# Patient Record
Sex: Male | Born: 1950 | Race: White | Hispanic: No | Marital: Married | Smoking: Former smoker
Health system: Southern US, Community
[De-identification: ages and names within clinical notes are randomized; demographics above are authoritative.]

## PROBLEM LIST (undated history)

## (undated) DIAGNOSIS — R7303 Prediabetes: Secondary | ICD-10-CM

## (undated) DIAGNOSIS — K589 Irritable bowel syndrome without diarrhea: Secondary | ICD-10-CM

## (undated) DIAGNOSIS — M1990 Unspecified osteoarthritis, unspecified site: Secondary | ICD-10-CM

## (undated) DIAGNOSIS — J449 Chronic obstructive pulmonary disease, unspecified: Secondary | ICD-10-CM

## (undated) DIAGNOSIS — E785 Hyperlipidemia, unspecified: Secondary | ICD-10-CM

## (undated) DIAGNOSIS — N189 Chronic kidney disease, unspecified: Secondary | ICD-10-CM

## (undated) DIAGNOSIS — K3580 Unspecified acute appendicitis: Secondary | ICD-10-CM

## (undated) DIAGNOSIS — R319 Hematuria, unspecified: Secondary | ICD-10-CM

## (undated) DIAGNOSIS — I10 Essential (primary) hypertension: Secondary | ICD-10-CM

## (undated) DIAGNOSIS — K219 Gastro-esophageal reflux disease without esophagitis: Secondary | ICD-10-CM

## (undated) DIAGNOSIS — E559 Vitamin D deficiency, unspecified: Secondary | ICD-10-CM

## (undated) HISTORY — DX: Vitamin D deficiency, unspecified: E55.9

## (undated) HISTORY — DX: Chronic kidney disease, unspecified: N18.9

## (undated) HISTORY — DX: Chronic obstructive pulmonary disease, unspecified: J44.9

## (undated) HISTORY — DX: Hematuria, unspecified: R31.9

## (undated) HISTORY — DX: Prediabetes: R73.03

## (undated) HISTORY — DX: Hyperlipidemia, unspecified: E78.5

## (undated) HISTORY — PX: TRIGGER FINGER RELEASE: SHX641

## (undated) HISTORY — DX: Unspecified acute appendicitis: K35.80

## (undated) HISTORY — PX: CARPAL TUNNEL RELEASE: SHX101

## (undated) HISTORY — DX: Unspecified osteoarthritis, unspecified site: M19.90

## (undated) HISTORY — PX: CATARACT EXTRACTION, BILATERAL: SHX1313

## (undated) HISTORY — DX: Gastro-esophageal reflux disease without esophagitis: K21.9

## (undated) HISTORY — DX: Irritable bowel syndrome, unspecified: K58.9

## (undated) HISTORY — PX: CERVICAL FUSION: SHX112

## (undated) HISTORY — DX: Essential (primary) hypertension: I10

## (undated) HISTORY — PX: HAND SURGERY: SHX662

---

## 1999-04-11 ENCOUNTER — Encounter: Payer: Self-pay | Admitting: Neurosurgery

## 1999-04-17 ENCOUNTER — Encounter: Payer: Self-pay | Admitting: Neurosurgery

## 1999-04-17 ENCOUNTER — Ambulatory Visit (HOSPITAL_COMMUNITY): Admission: RE | Admit: 1999-04-17 | Discharge: 1999-04-18 | Payer: Self-pay | Admitting: Neurosurgery

## 1999-05-13 ENCOUNTER — Ambulatory Visit (HOSPITAL_COMMUNITY): Admission: RE | Admit: 1999-05-13 | Discharge: 1999-05-13 | Payer: Self-pay | Admitting: Neurosurgery

## 2000-01-12 ENCOUNTER — Ambulatory Visit (HOSPITAL_COMMUNITY): Admission: RE | Admit: 2000-01-12 | Discharge: 2000-01-12 | Payer: Self-pay | Admitting: Neurosurgery

## 2000-01-12 ENCOUNTER — Encounter: Payer: Self-pay | Admitting: Neurosurgery

## 2000-11-20 ENCOUNTER — Ambulatory Visit (HOSPITAL_COMMUNITY): Admission: RE | Admit: 2000-11-20 | Discharge: 2000-11-20 | Payer: Self-pay | Admitting: Gastroenterology

## 2002-06-03 ENCOUNTER — Ambulatory Visit (HOSPITAL_COMMUNITY): Admission: RE | Admit: 2002-06-03 | Discharge: 2002-06-03 | Payer: Self-pay | Admitting: Internal Medicine

## 2002-06-03 ENCOUNTER — Encounter: Payer: Self-pay | Admitting: Internal Medicine

## 2002-08-10 ENCOUNTER — Ambulatory Visit (HOSPITAL_BASED_OUTPATIENT_CLINIC_OR_DEPARTMENT_OTHER): Admission: RE | Admit: 2002-08-10 | Discharge: 2002-08-10 | Payer: Self-pay | Admitting: Orthopedic Surgery

## 2005-01-01 ENCOUNTER — Ambulatory Visit (HOSPITAL_COMMUNITY): Admission: RE | Admit: 2005-01-01 | Discharge: 2005-01-01 | Payer: Self-pay | Admitting: Internal Medicine

## 2005-07-02 ENCOUNTER — Ambulatory Visit (HOSPITAL_COMMUNITY): Admission: RE | Admit: 2005-07-02 | Discharge: 2005-07-02 | Payer: Self-pay | Admitting: Internal Medicine

## 2007-08-05 ENCOUNTER — Ambulatory Visit (HOSPITAL_COMMUNITY): Admission: RE | Admit: 2007-08-05 | Discharge: 2007-08-05 | Payer: Self-pay | Admitting: Internal Medicine

## 2007-08-05 ENCOUNTER — Ambulatory Visit: Payer: Self-pay | Admitting: Vascular Surgery

## 2009-08-15 ENCOUNTER — Ambulatory Visit (HOSPITAL_COMMUNITY): Admission: RE | Admit: 2009-08-15 | Discharge: 2009-08-15 | Payer: Self-pay | Admitting: Internal Medicine

## 2010-11-08 NOTE — Op Note (Signed)
   NAME:  [name] Park, [name] Park                         ACCOUNT NO.:  [id number]   MEDICAL RECORD NO.:  [id number]                   PATIENT TYPE:  AMB   LOCATION:  DSC                                  FACILITY:  [facility]   PHYSICIAN:  Artist Pais. Mina Marble, M.D.           DATE OF BIRTH:  1950/09/20   DATE OF PROCEDURE:  08/10/2002  DATE OF DISCHARGE:                                 OPERATIVE REPORT   PREOPERATIVE DIAGNOSIS:  Left carpal tunnel syndrome.   POSTOPERATIVE DIAGNOSIS:  Left carpal tunnel syndrome.   PROCEDURE:  Left carpal tunnel release.   SURGEON:  Artist Pais. Mina Marble, M.D.   ASSISTANT:  ____________.   ANESTHESIA:  General.   TOURNIQUET TIME:  11 minutes.   COMPLICATIONS:  No complications or drains.   OPERATIVE FINDINGS:  The patient taken to the operating room.  After  adequate general anesthesia the left upper extremity was prepped and draped  in the usual sterile fashion.  An Esmarch was used to exsanguinate the limb.  The tourniquet was then inflated to 150 mmHg.  At this point in time, a 2 cm  incision was made in the palmar aspect of the left hand in line with the  long finger metacarpal starting at  Kaplan's cardinal line.  The incision  was taken down to the skin and subcutaneous tissues until the palmar fascia  identified.  The palmar fascia was split thus exposing the distal edge of  the transcarpal ligament and the superficial palmar arch.  The superficial  palmar arch was retracted distally and distal edge of the extensor cardinal  ligament was incised with a 15 blade thus excising the median nerve and  contents of the carpal canal.  Next, using curved blunt scissor the  remaining aspects of the transcarpal ligament were divided under direct  vision.  The canal was inspected.  The interosseous lesions again were  present.  The wound was then thoroughly irrigated.  The wound was then  closed the running 3-0 Prolene subcuticular stitch, Steri Strips, 4x4  fluffs  and compression dressing was applied.  Patient tolerated the procedure well  and went to recovery in stable fashion.                                                Artist Pais Mina Marble, M.D.    MAW/MEDQ  D:  08/10/2002  T:  08/10/2002  Job:  [id number]

## 2011-08-18 ENCOUNTER — Ambulatory Visit (HOSPITAL_COMMUNITY)
Admission: RE | Admit: 2011-08-18 | Discharge: 2011-08-18 | Disposition: A | Discharge status: Home / Self Care | Payer: Federal, State, Local not specified - PPO | Source: Ambulatory Visit | Attending: Internal Medicine | Admitting: Internal Medicine

## 2011-08-18 ENCOUNTER — Other Ambulatory Visit (HOSPITAL_COMMUNITY): Payer: Self-pay | Admitting: Internal Medicine

## 2011-08-18 DIAGNOSIS — R0602 Shortness of breath: Secondary | ICD-10-CM | POA: Insufficient documentation

## 2011-08-18 DIAGNOSIS — Z0000 Encounter for general adult medical examination without abnormal findings: Secondary | ICD-10-CM | POA: Insufficient documentation

## 2012-12-17 ENCOUNTER — Encounter: Payer: Self-pay | Admitting: Gastroenterology

## 2013-01-05 ENCOUNTER — Encounter: Payer: Self-pay | Admitting: Gastroenterology

## 2013-01-05 ENCOUNTER — Ambulatory Visit (INDEPENDENT_AMBULATORY_CARE_PROVIDER_SITE_OTHER): Payer: Federal, State, Local not specified - PPO | Admitting: Gastroenterology

## 2013-01-05 VITALS — BP 124/70 | HR 80 | Ht 67.5 in | Wt 180.5 lb

## 2013-01-05 DIAGNOSIS — R1319 Other dysphagia: Secondary | ICD-10-CM

## 2013-01-05 DIAGNOSIS — K219 Gastro-esophageal reflux disease without esophagitis: Secondary | ICD-10-CM

## 2013-01-05 DIAGNOSIS — R066 Hiccough: Secondary | ICD-10-CM

## 2013-01-05 MED ORDER — OMEPRAZOLE 20 MG PO CPDR: 20.0000 mg | DELAYED_RELEASE_CAPSULE | Freq: Every day | ORAL | Status: DC

## 2013-01-05 NOTE — Progress Notes (Signed)
History of Present Illness: This is a 63 year old male accompanied by his wife. He relates a one year history of intermittent difficulties swallowing solid foods associated with chest discomfort and hiccups. He has had to regurgitate food on several occasions to relieve symptoms. He notes infrequent heartburn symptoms. He previously underwent colonoscopies by Dr. Kinnie Scales in 2002 and 2007. Both were normal. Denies weight loss, abdominal pain, constipation, diarrhea, change in stool caliber, melena, hematochezia, nausea, vomiting, chest pain.  Review of Systems: Pertinent positive and negative review of systems were noted in the above HPI section. All other review of systems were otherwise negative.  Current Medications, Allergies, Past Medical History, Past Surgical History, Family History and Social History were reviewed in [employer] record.  Physical Exam: General: Well developed , well nourished, no acute distress Head: Normocephalic and atraumatic Eyes:  sclerae anicteric, EOMI Ears: Normal auditory acuity Mouth: No deformity or lesions Neck: Supple, no masses or thyromegaly Lungs: Clear throughout to auscultation Heart: Regular rate and rhythm; no murmurs, rubs or bruits Abdomen: Soft, non tender and non distended. No masses, hepatosplenomegaly or hernias noted. Normal Bowel sounds Rectal: deferred Musculoskeletal: Symmetrical with no gross deformities  Skin: No lesions on visible extremities Pulses:  Normal pulses noted Extremities: No clubbing, cyanosis, edema or deformities noted Neurological: Alert oriented x 4, grossly nonfocal Cervical Nodes:  No significant cervical adenopathy Inguinal Nodes: No significant inguinal adenopathy Psychological:  Alert and cooperative. Normal mood and affect  Assessment and Recommendations:  1. Solid food dysphagia with hiccups and reflux symptoms. Suspected GERD and an esophageal stricture or esophagitis. Begin omeprazole  20 mg daily and antireflux measures. Schedule endoscopy with possible dilation. The risks, benefits, and alternatives to endoscopy with possible biopsy and possible dilation were discussed with the patient and they consent to proceed.   2. Colorectal cancer screening, average risk. 10 year interval for colonoscopy. Plan for surveillance colonoscopy in May 2017.

## 2013-01-05 NOTE — Patient Instructions (Addendum)
You have been scheduled for an endoscopy with propofol. Please follow written instructions given to you at your visit today. If you use inhalers (even only as needed), please bring them with you on the day of your procedure. Your physician has requested that you go to www.startemmi.com and enter the access code given to you at your visit today. This web site gives a general overview about your procedure. However, you should still follow specific instructions given to you by our office regarding your preparation for the procedure.  We have sent the following medications to your pharmacy for you to pick up at your convenience: Omeprazole.  Patient advised to avoid spicy, acidic, citrus, chocolate, mints, fruit and fruit juices.  Limit the intake of caffeine, alcohol and Soda.  Don't exercise too soon after eating.  Don't lie down within 3-4 hours of eating.  Elevate the head of your bed.  Thank you for choosing me and [facility].  Venita Lick. Pleas Koch., MD., Clementeen Graham  cc: Lucky Cowboy, MD

## 2013-02-16 ENCOUNTER — Ambulatory Visit (AMBULATORY_SURGERY_CENTER): Payer: Federal, State, Local not specified - PPO | Admitting: Gastroenterology

## 2013-02-16 ENCOUNTER — Encounter: Payer: Self-pay | Admitting: Gastroenterology

## 2013-02-16 VITALS — BP 122/72 | HR 77 | Temp 97.5°F | Resp 16 | Ht 67.0 in | Wt 180.0 lb

## 2013-02-16 DIAGNOSIS — R1319 Other dysphagia: Secondary | ICD-10-CM

## 2013-02-16 DIAGNOSIS — K219 Gastro-esophageal reflux disease without esophagitis: Secondary | ICD-10-CM

## 2013-02-16 DIAGNOSIS — K222 Esophageal obstruction: Secondary | ICD-10-CM

## 2013-02-16 MED ORDER — SODIUM CHLORIDE 0.9 % IV SOLN: 500.0000 mL | INTRAVENOUS | Status: DC

## 2013-02-16 NOTE — Op Note (Addendum)
[facility] [address] [place], [address]   ENDOSCOPY PROCEDURE REPORT  PATIENT: [name] Park, [name] Park  MR#: [id number] BIRTHDATE: 1950-10-11 , 61  yrs. old GENDER: Male ENDOSCOPIST: Meryl Dare, MD, [facility] REFERRED BY:  Lucky Cowboy, M.D. PROCEDURE DATE:  02/16/2013 PROCEDURE:  EGD, diagnostic and Savary dilation of esophagus ASA CLASS:     Class II INDICATIONS:  Dysphagia.   History of esophageal reflux. MEDICATIONS: MAC sedation, administered by CRNA and propofol (Diprivan) 200mg  IV TOPICAL ANESTHETIC: Cetacaine Spray DESCRIPTION OF PROCEDURE: After the risks benefits and alternatives of the procedure were thoroughly explained, informed consent was obtained.  The LB [id number] endoscope was introduced through the mouth and advanced to the second portion of the duodenum without limitations.  The instrument was slowly withdrawn as the mucosa was fully examined.  ESOPHAGUS: A stricture was found at the gastroesophageal junction. The stenosis was traversable with the endoscope. The esophagus was otherwise normal. STOMACH: The mucosa and folds of the stomach appeared normal. DUODENUM: The duodenal mucosa showed no abnormalities in the bulb and second portion of the duodenum.  Retroflexed views revealed a 5 cm hiatal hernia.  A guidewire was placed and the endoscope was then withdrawn from the patient. 16 mm and 17 mm Savary dilators were passed with minimal resistance to each and no heme. The guidewire and last dilator were removed and the procedure was completed.  COMPLICATIONS: There were no complications.  ENDOSCOPIC IMPRESSION: 1.   Stricture at the gastroesophageal junction 2.   5 cm hiatal hernia  RECOMMENDATIONS: 1.  Anti-reflux regimen long term 2.  Continue PPI long term 3.  Post dilation instructions  eSigned:  Meryl Dare, MD, [facility] 02/16/2013 1:51 PM Revised: 02/16/2013 1:51 PM

## 2013-02-16 NOTE — Progress Notes (Signed)
Lidocaine-40mg IV prior to Propofol InductionPropofol given over incremental dosages 

## 2013-02-16 NOTE — Progress Notes (Signed)
Called to room to assist during endoscopic procedure.  Patient ID and intended procedure confirmed with present staff. Received instructions for my participation in the procedure from the performing physician.  

## 2013-02-16 NOTE — Patient Instructions (Signed)
      YOU HAD AN ENDOSCOPIC PROCEDURE TODAY AT THE [facility]: Refer to the procedure report that was given to you for any specific questions about what was found during the examination.  If the procedure report does not answer your questions, please call your gastroenterologist to clarify.  If you requested that your care partner not be given the details of your procedure findings, then the procedure report has been included in a sealed envelope for you to review at your convenience later.  YOU SHOULD EXPECT: Some feelings of bloating in the abdomen. Passage of more gas than usual.  Walking can help get rid of the air that was put into your GI tract during the procedure and reduce the bloating. If you had a lower endoscopy (such as a colonoscopy or flexible sigmoidoscopy) you may notice spotting of blood in your stool or on the toilet paper. If you underwent a bowel prep for your procedure, then you may not have a normal bowel movement for a few days.  DIET: NOTHING TO EAT OR DRINK UNTIL 2:45 PM. 2:45 UNTIL 3:45 ONLY CLEAR LIQUIDS. AFTER 3:45 ONLY SOFT FOODS UNTIL TOMORROW AM. RESUME YOUR REGULAR DIET IN AM.  ACTIVITY: Your care partner should take you home directly after the procedure.  You should plan to take it easy, moving slowly for the rest of the day.  You can resume normal activity the day after the procedure however you should NOT DRIVE or use heavy machinery for 24 hours (because of the sedation medicines used during the test).    SYMPTOMS TO REPORT IMMEDIATELY: A gastroenterologist can be reached at any hour.  During normal business hours, 8:30 AM to 5:00 PM Monday through Friday, call [phone].  After hours and on weekends, please call the GI answering service at [phone] who will take a message and have the physician on call contact you.   Following upper endoscopy (EGD)  Vomiting of blood or coffee ground material  New chest pain or pain under the  shoulder blades  Painful or persistently difficult swallowing  New shortness of breath  Fever of 100F or higher  Black, tarry-looking stools  FOLLOW UP: If any biopsies were taken you will be contacted by phone or by letter within the next 1-3 weeks.  Call your gastroenterologist if you have not heard about the biopsies in 3 weeks.  Our staff will call the home number listed on your records the next business day following your procedure to check on you and address any questions or concerns that you may have at that time regarding the information given to you following your procedure. This is a courtesy call and so if there is no answer at the home number and we have not heard from you through the emergency physician on call, we will assume that you have returned to your regular daily activities without incident.  SIGNATURES/CONFIDENTIALITY: You and/or your care partner have signed paperwork which will be entered into your electronic medical record.  These signatures attest to the fact that that the information above on your After Visit Summary has been reviewed and is understood.  Full responsibility of the confidentiality of this discharge information lies with you and/or your care-partner.

## 2013-02-16 NOTE — Progress Notes (Signed)
Patient did not experience any of the following events: a burn prior to discharge; a fall within the facility; wrong site/side/patient/procedure/implant event; or a hospital transfer or hospital admission upon discharge from the facility. (G8907) Patient did not have preoperative order for IV antibiotic SSI prophylaxis. (G8918)  

## 2013-02-17 ENCOUNTER — Telehealth: Payer: Self-pay | Admitting: *Deleted

## 2013-02-17 NOTE — Telephone Encounter (Signed)
  Follow up Call-  Call back number 02/16/2013  Post procedure Call Back phone  # [phone]  Permission to leave phone message Yes     Patient questions:  Do you have a fever, pain , or abdominal swelling? no Pain Score  0 *  Have you tolerated food without any problems? yes  Have you been able to return to your normal activities? yes  Do you have any questions about your discharge instructions: Diet   no Medications  no Follow up visit  no  Do you have questions or concerns about your Care? no  Actions: * If pain score is 4 or above: No action needed, pain <4. Spoke with pt's wife who states that pt did fine, no issues, no pain, no fever. ewm

## 2013-04-28 ENCOUNTER — Other Ambulatory Visit: Payer: Self-pay

## 2013-05-08 ENCOUNTER — Other Ambulatory Visit: Payer: Self-pay | Admitting: Internal Medicine

## 2013-05-08 DIAGNOSIS — E782 Mixed hyperlipidemia: Secondary | ICD-10-CM

## 2013-05-26 ENCOUNTER — Encounter: Payer: Self-pay | Admitting: Internal Medicine

## 2013-06-02 ENCOUNTER — Ambulatory Visit (INDEPENDENT_AMBULATORY_CARE_PROVIDER_SITE_OTHER): Payer: Federal, State, Local not specified - PPO | Admitting: Internal Medicine

## 2013-06-02 VITALS — BP 124/78 | HR 68 | Temp 98.2°F | Resp 18 | Wt 187.8 lb

## 2013-06-02 DIAGNOSIS — I10 Essential (primary) hypertension: Secondary | ICD-10-CM

## 2013-06-02 DIAGNOSIS — R7309 Other abnormal glucose: Secondary | ICD-10-CM

## 2013-06-02 DIAGNOSIS — E559 Vitamin D deficiency, unspecified: Secondary | ICD-10-CM

## 2013-06-02 DIAGNOSIS — Z79899 Other long term (current) drug therapy: Secondary | ICD-10-CM

## 2013-06-02 DIAGNOSIS — E782 Mixed hyperlipidemia: Secondary | ICD-10-CM

## 2013-06-02 LAB — CBC WITH DIFFERENTIAL/PLATELET
Eosinophils Absolute: 0.1 10*3/uL (ref 0.0–0.7)
Eosinophils Relative: 2 % (ref 0–5)
HCT: 39.8 % (ref 39.0–52.0)
Hemoglobin: 13.8 g/dL (ref 13.0–17.0)
Lymphocytes Relative: 22 % (ref 12–46)
Lymphs Abs: 1.4 10*3/uL (ref 0.7–4.0)
MCH: 29.9 pg (ref 26.0–34.0)
MCV: 86.3 fL (ref 78.0–100.0)
Monocytes Absolute: 0.7 10*3/uL (ref 0.1–1.0)
Monocytes Relative: 11 % (ref 3–12)
RBC: 4.61 MIL/uL (ref 4.22–5.81)
WBC: 6.5 10*3/uL (ref 4.0–10.5)

## 2013-06-02 LAB — TSH: TSH: 0.838 u[IU]/mL (ref 0.350–4.500)

## 2013-06-02 LAB — LIPID PANEL
HDL: 56 mg/dL (ref >39)
LDL Cholesterol: 62 mg/dL (ref 0–99)
Total CHOL/HDL Ratio: 2.6 Ratio

## 2013-06-02 LAB — HEMOGLOBIN A1C: Hgb A1c MFr Bld: 5.7 % — ABNORMAL HIGH (ref <5.7)

## 2013-06-02 LAB — HEPATIC FUNCTION PANEL
ALT: 25 U/L (ref 0–53)
AST: 24 U/L (ref 0–37)
Albumin: 4.3 g/dL (ref 3.5–5.2)
Alkaline Phosphatase: 86 U/L (ref 39–117)
Total Bilirubin: 0.5 mg/dL (ref 0.3–1.2)

## 2013-06-02 LAB — BASIC METABOLIC PANEL WITH GFR
CO2: 31 mEq/L (ref 19–32)
Calcium: 9.9 mg/dL (ref 8.4–10.5)
Chloride: 102 mEq/L (ref 96–112)
Creat: 1.34 mg/dL (ref 0.50–1.35)
Glucose, Bld: 80 mg/dL (ref 70–99)

## 2013-06-02 LAB — MAGNESIUM: Magnesium: 2 mg/dL (ref 1.5–2.5)

## 2013-06-02 NOTE — Patient Instructions (Signed)

## 2013-06-02 NOTE — Progress Notes (Signed)
Patient ID: [name] Park, male   DOB: September 26, 1950, 62 y.o.   MRN: [id number]   This very nice 62 yo MWM presents for 3 month follow up with Hypertension, Hyperlipidemia, Pre-Diabetes and Vitamin D Deficiency.    BP has been controlled at home. Today's BP is 124/78. Patient denies any cardiac type chest pain, palpitations, dyspnea/orthopnea/PND, dizziness, claudication, or dependent edema.   Hyperlipidemia is controlled with diet & meds. Last Cholesterol was 153, Triglycerides were 137, HDL 52 and LDL 71 in September . Patient denies myalgias or other med SE's.    Also, the patient has history of PreDiabetes/insulin resistance with last A1c of 5.5% and elevated insulin 43 showing insulin resistance in June. Patient denies any symptoms of reactive hypoglycemia, diabetic polys, paresthesias or visual blurring.   Further, Patient has history of Vitamin D Deficiency with last vitamin D of 69 (was 31 in 2011). Patient supplements vitamin D without any suspected side-effects.    Medication List       buPROPion 150 MG 24 hr tablet  Commonly known as:  WELLBUTRIN XL  Take 150 mg by mouth daily.     multivitamin tablet  Take 1 tablet by mouth daily.     omeprazole 20 MG capsule  Commonly known as:  PRILOSEC  Take 1 capsule (20 mg total) by mouth daily.     pravastatin 40 MG tablet  Commonly known as:  PRAVACHOL  TAKE 1 TABLET BY MOUTH AT BEDTIME FOR CHOLESTROL     solifenacin 5 MG tablet  Commonly known as:  VESICARE  Take 10 mg by mouth daily.     Vitamin D-3 1000 UNITS Caps  Take 2,000 Units by mouth daily.          No Known Allergies  PMHx:   Past Medical History  Diagnosis Date  . COPD (chronic obstructive pulmonary disease)   . GERD (gastroesophageal reflux disease)   . HLD (hyperlipidemia)   . IBS (irritable bowel syndrome)   . Pneumonia   . Hypertension   . Pre-diabetes   . Vitamin D deficiency   . Hematuria   . Hx of CABG     FHx:    Reviewed /  unchanged  SHx:    Reviewed / unchanged  Systems Review: Constitutional: Denies fever, chills, wt changes, headaches, insomnia, fatigue, night sweats, change in appetite. Eyes: Denies redness, blurred vision, diplopia, discharge, itchy, watery eyes.  ENT: Denies discharge, congestion, post nasal drip, epistaxis, sore throat, earache, hearing loss, dental pain, tinnitus, vertigo, sinus pain, snoring.  CV: Denies chest pain, palpitations, irregular heartbeat, syncope, dyspnea, diaphoresis, orthopnea, PND, claudication, edema. Respiratory: denies cough, dyspnea, DOE, pleurisy, hoarseness, laryngitis, wheezing.  Gastrointestinal: Denies dysphagia, odynophagia, heartburn, reflux, water brash, abdominal pain or cramps, nausea, vomiting, bloating, diarrhea, constipation, hematemesis, melena, hematochezia,  or hemorrhoids. Genitourinary: Denies dysuria, frequency, urgency, nocturia, hesitancy, discharge, hematuria, flank pain. Musculoskeletal: Denies arthralgias, myalgias, stiffness, jt. swelling, pain, limp, strain/sprain.  Skin: Denies pruritus, rash, hives, warts, acne, eczema, change in skin lesion(s). Neuro: No weakness, tremor, incoordination, spasms, paresthesia, or pain. Psychiatric: Denies confusion, memory loss, or sensory loss. Endo: Denies change in weight, skin, hair change.  Heme/Lymph: No excessive bleeding, bruising, orenlarged lymph nodes.  Filed Vitals:   06/02/13 1050  BP: 124/78  Pulse: 68  Temp: 98.2 F (36.8 C)  Resp: 18    Estimated body mass index is 29.41 kg/(m^2) as calculated from the following:   Height as of 02/16/13: 5\' 7"  (1.702 m).  Weight as of this encounter: 187 lb 12.8 oz (85.186 kg).  On Exam: Appears well nourished - in no distress. Eyes: PERRLA, EOMs, conjunctiva no swelling or erythema. Sinuses: No frontal/maxillary tenderness ENT/Mouth: EAC's clear, TM's nl w/o erythema, bulging. Nares clear w/o erythema, swelling, exudates. Oropharynx clear  without erythema or exudates. Oral hygiene is good. Tongue normal, non obstructing. Hearing intact.  Neck: Supple. Thyroid nl. Car 2+/2+ without bruits, nodes or JVD. Chest: Respirations nl with BS clear & equal w/o rales, rhonchi, wheezing or stridor.  Cor: Heart sounds normal w/ regular rate and rhythm without sig. murmurs, gallops, clicks, or rubs. Peripheral pulses normal and equal  without edema.  Abdomen: Soft & bowel sounds normal. Non-tender w/o guarding, rebound, hernias, masses, or organomegaly.  Lymphatics: Unremarkable.  Musculoskeletal: Full ROM all peripheral extremities, joint stability, 5/5 strength, and normal gait.  Skin: Warm, dry without exposed rashes, lesions, ecchymosis apparent.  Neuro: Cranial nerves intact, reflexes equal bilaterally. Sensory-motor testing grossly intact. Tendon reflexes grossly intact.  Pysch: Alert & oriented x 3. Insight and judgement nl & appropriate. No ideations.  Assessment and Plan:  1. Hypertension - Continue monitor blood pressure at home. Continue diet/meds same.  2. Hyperlipidemia - Continue diet/meds, exercise,& lifestyle modifications. Continue monitor periodic cholesterol/liver & renal functions   3. Pre-diabetes/Insulin Resistance - Continue diet, exercise, lifestyle modifications. Monitor appropriate labs.  4. Vitamin D Deficiency - Continue supplementation.  Recommended regular exercise, BP monitoring, weight control, and discussed med and SE's. Recommended labs to assess and monitor clinical status. Further disposition pending results of labs.

## 2013-06-03 LAB — INSULIN, FASTING: Insulin fasting, serum: 11 u[IU]/mL (ref 3–28)

## 2013-06-20 ENCOUNTER — Ambulatory Visit (INDEPENDENT_AMBULATORY_CARE_PROVIDER_SITE_OTHER): Payer: Federal, State, Local not specified - PPO | Admitting: Emergency Medicine

## 2013-06-20 ENCOUNTER — Encounter: Payer: Self-pay | Admitting: Emergency Medicine

## 2013-06-20 VITALS — BP 134/76 | HR 94 | Temp 98.4°F | Resp 18 | Wt 186.0 lb

## 2013-06-20 DIAGNOSIS — J329 Chronic sinusitis, unspecified: Secondary | ICD-10-CM

## 2013-06-20 DIAGNOSIS — J309 Allergic rhinitis, unspecified: Secondary | ICD-10-CM

## 2013-06-20 DIAGNOSIS — J40 Bronchitis, not specified as acute or chronic: Secondary | ICD-10-CM

## 2013-06-20 DIAGNOSIS — E559 Vitamin D deficiency, unspecified: Secondary | ICD-10-CM

## 2013-06-20 DIAGNOSIS — E782 Mixed hyperlipidemia: Secondary | ICD-10-CM

## 2013-06-20 MED ORDER — AZITHROMYCIN 250 MG PO TABS: ORAL_TABLET | ORAL | Status: AC

## 2013-06-20 MED ORDER — BENZONATATE 100 MG PO CAPS: 100.0000 mg | ORAL_CAPSULE | Freq: Three times a day (TID) | ORAL | Status: DC | PRN

## 2013-06-20 MED ORDER — PREDNISONE 10 MG PO TABS: ORAL_TABLET | ORAL | Status: DC

## 2013-06-20 NOTE — Patient Instructions (Signed)
Bronchitis Bronchitis is a problem of the air tubes leading to your lungs. This problem makes it hard for air to get in and out of the lungs. You may cough a lot because your air tubes are narrow. Going without care can cause lasting (chronic) bronchitis. HOME CARE   Drink enough fluids to keep your pee (urine) clear or pale yellow.  Use a cool mist humidifier.  Quit smoking if you smoke. If you keep smoking, the bronchitis might not get better.  Only take medicine as told by your doctor. GET HELP RIGHT AWAY IF:   Coughing keeps you awake.  You start to wheeze.  You become more sick or weak.  You have a hard time breathing or get short of breath.  You cough up blood.  Coughing lasts more than 2 weeks.  You have a fever.  Your baby is older than 3 months with a rectal temperature of 102 F (38.9 C) or higher.  Your baby is 3 months old or younger with a rectal temperature of 100.4 F (38 C) or higher. MAKE SURE YOU:  Understand these instructions.  Will watch your condition.  Will get help right away if you are not doing well or get worse. Document Released: 11/26/2007 Document Revised: 09/01/2011 Document Reviewed: 02/01/2013 [employer] Patient Information 2014 [employer]. Allergic Rhinitis Allergic rhinitis is when the mucous membranes in the nose respond to allergens. Allergens are particles in the air that cause your body to have an allergic reaction. This causes you to release allergic antibodies. Through a chain of events, these eventually cause you to release histamine into the blood stream (hence the use of antihistamines). Although meant to be protective to the body, it is this release that causes your discomfort, such as frequent sneezing, congestion and an itchy runny nose.  CAUSES  The pollen allergens may come from grasses, trees, and weeds. This is seasonal allergic rhinitis, or "hay fever." Other allergens cause year-round allergic rhinitis (perennial  allergic rhinitis) such as house dust mite allergen, pet dander and mold spores.  SYMPTOMS   Nasal stuffiness (congestion).  Runny, itchy nose with sneezing and tearing of the eyes.  There is often an itching of the mouth, eyes and ears. It cannot be cured, but it can be controlled with medications. DIAGNOSIS  If you are unable to determine the offending allergen, skin or blood testing may find it. TREATMENT   Avoid the allergen.  Medications and allergy shots (immunotherapy) can help.  Hay fever may often be treated with antihistamines in pill or nasal spray forms. Antihistamines block the effects of histamine. There are over-the-counter medicines that may help with nasal congestion and swelling around the eyes. Check with your caregiver before taking or giving this medicine. If the treatment above does not work, there are many new medications your caregiver can prescribe. Stronger medications may be used if initial measures are ineffective. Desensitizing injections can be used if medications and avoidance fails. Desensitization is when a patient is given ongoing shots until the body becomes less sensitive to the allergen. Make sure you follow up with your caregiver if problems continue. SEEK MEDICAL CARE IF:   You develop fever (more than 100.5 F (38.1 C).  You develop a cough that does not stop easily (persistent).  You have shortness of breath.  You start wheezing.  Symptoms interfere with normal daily activities. Document Released: 03/04/2001 Document Revised: 09/01/2011 Document Reviewed: 09/13/2008 [employer] Patient Information 2014 [employer].  

## 2013-06-20 NOTE — Progress Notes (Signed)
   Subjective:    Patient ID: [name] Park, male    DOB: 1950-12-03, 62 y.o.   MRN: [id number]  HPI Comments: 62 yo male pesents with increasing URI sx. He has tried Mucinex x 3 days now with green production this a.m. + chills x 1 day. Started with scratchy/ sore throat last week. + exposure wife with same symptoms.  Cough Associated symptoms include a sore throat.    Current Outpatient Prescriptions on File Prior to Visit  Medication Sig Dispense Refill  . buPROPion (WELLBUTRIN XL) 150 MG 24 hr tablet Take 150 mg by mouth daily.      . Cholecalciferol (VITAMIN D-3) 1000 UNITS CAPS Take 2,000 Units by mouth daily.      . Multiple Vitamin (MULTIVITAMIN) tablet Take 1 tablet by mouth daily.      Marland Kitchen omeprazole (PRILOSEC) 20 MG capsule Take 1 capsule (20 mg total) by mouth daily.  30 capsule  11  . pravastatin (PRAVACHOL) 40 MG tablet TAKE 1 TABLET BY MOUTH AT BEDTIME FOR CHOLESTROL  90 tablet  3  . solifenacin (VESICARE) 5 MG tablet Take 10 mg by mouth daily.       No current facility-administered medications on file prior to visit.    Review of patient's allergies indicates no known allergies.  Past Medical History  Diagnosis Date  . COPD (chronic obstructive pulmonary disease)   . GERD (gastroesophageal reflux disease)   . HLD (hyperlipidemia)   . IBS (irritable bowel syndrome)   . Pneumonia   . Hypertension   . Pre-diabetes   . Vitamin D deficiency   . Hematuria   . Hx of CABG      Review of Systems  Constitutional: Positive for diaphoresis.  HENT: Positive for congestion, sinus pressure and sore throat.   Respiratory: Positive for cough.    BP 134/76  Pulse 94  Temp(Src) 98.4 F (36.9 C) (Temporal)  Resp 18  Wt 186 lb (84.369 kg)     Objective:   Physical Exam  Nursing note and vitals reviewed. Constitutional: He is oriented to person, place, and time. He appears well-developed and well-nourished.  HENT:  Head: Normocephalic and atraumatic.  Right Ear:  External ear normal.  Left Ear: External ear normal.  Nose: Nose normal.  Mouth/Throat: Oropharynx is clear and moist. No oropharyngeal exudate.  Frontal/ maxillary tenderness C/S post pharynx with Erythema  Eyes: Conjunctivae are normal.  Neck: Normal range of motion.  Cardiovascular: Normal rate, regular rhythm, normal heart sounds and intact distal pulses.   Pulmonary/Chest: Effort normal. He has wheezes.  Abdominal: Soft.  Musculoskeletal: Normal range of motion.  Lymphadenopathy:    He has no cervical adenopathy.  Neurological: He is alert and oriented to person, place, and time.  Skin: Skin is warm and dry.  Psychiatric: He has a normal mood and affect. Judgment normal.          Assessment & Plan:  Sinusitis/ Bronchitis/ Allergic rhinitis- Allegra OTC, increase H2o, allergy hygiene explained. Pred Dp10mg , Zpak, Tessalon Perles 100 mg All AD. Call if no change with SX.

## 2013-06-21 DIAGNOSIS — E559 Vitamin D deficiency, unspecified: Secondary | ICD-10-CM | POA: Insufficient documentation

## 2013-09-02 ENCOUNTER — Ambulatory Visit (HOSPITAL_COMMUNITY)
Admission: RE | Admit: 2013-09-02 | Discharge: 2013-09-02 | Disposition: A | Discharge status: Home / Self Care | Payer: Federal, State, Local not specified - PPO | Source: Ambulatory Visit | Attending: Emergency Medicine | Admitting: Emergency Medicine

## 2013-09-02 ENCOUNTER — Encounter: Payer: Self-pay | Admitting: Emergency Medicine

## 2013-09-02 ENCOUNTER — Ambulatory Visit (INDEPENDENT_AMBULATORY_CARE_PROVIDER_SITE_OTHER): Payer: Federal, State, Local not specified - PPO | Admitting: Emergency Medicine

## 2013-09-02 VITALS — BP 140/78 | HR 74 | Temp 98.6°F | Resp 16 | Ht 68.0 in | Wt 186.0 lb

## 2013-09-02 DIAGNOSIS — Z79899 Other long term (current) drug therapy: Secondary | ICD-10-CM

## 2013-09-02 DIAGNOSIS — R0602 Shortness of breath: Secondary | ICD-10-CM | POA: Insufficient documentation

## 2013-09-02 DIAGNOSIS — E559 Vitamin D deficiency, unspecified: Secondary | ICD-10-CM

## 2013-09-02 DIAGNOSIS — I10 Essential (primary) hypertension: Secondary | ICD-10-CM

## 2013-09-02 DIAGNOSIS — R7309 Other abnormal glucose: Secondary | ICD-10-CM

## 2013-09-02 DIAGNOSIS — E782 Mixed hyperlipidemia: Secondary | ICD-10-CM

## 2013-09-02 LAB — HEMOGLOBIN A1C
Hgb A1c MFr Bld: 5.4 % (ref <5.7)
Mean Plasma Glucose: 108 mg/dL (ref <117)

## 2013-09-02 LAB — CBC WITH DIFFERENTIAL/PLATELET
Basophils Absolute: 0.1 10*3/uL (ref 0.0–0.1)
Basophils Relative: 1 % (ref 0–1)
Eosinophils Absolute: 0.1 10*3/uL (ref 0.0–0.7)
Eosinophils Relative: 2 % (ref 0–5)
HEMATOCRIT: 41.3 % (ref 39.0–52.0)
Hemoglobin: 13.9 g/dL (ref 13.0–17.0)
Lymphocytes Relative: 31 % (ref 12–46)
Lymphs Abs: 1.8 10*3/uL (ref 0.7–4.0)
MCH: 29.5 pg (ref 26.0–34.0)
MCHC: 33.7 g/dL (ref 30.0–36.0)
MCV: 87.7 fL (ref 78.0–100.0)
MONO ABS: 0.6 10*3/uL (ref 0.1–1.0)
Monocytes Relative: 11 % (ref 3–12)
NEUTROS PCT: 55 % (ref 43–77)
Neutro Abs: 3.2 10*3/uL (ref 1.7–7.7)
Platelets: 331 10*3/uL (ref 150–400)
RBC: 4.71 MIL/uL (ref 4.22–5.81)
RDW: 13.6 % (ref 11.5–15.5)
WBC: 5.8 10*3/uL (ref 4.0–10.5)

## 2013-09-02 NOTE — Progress Notes (Signed)
Subjective:    Patient ID: [name] Park, male    DOB: [birth year], 63 y.o.   MRN: [id number]  HPI Comments: 63 yo male presents for 3 month F/U for HTN, Cholesterol, Pre-Dm, D. Deficient. He has not been exercising as much. He is not eating as healthy. He notes BP has been good at home. He has mild SOB increased x 2 months. He denies CP/ edema. He has gained a little weight. He notes SOB worse with climbing stairs.   CHOL         143   06/02/2013 HDL           56   06/02/2013 LDLCALC       62   06/02/2013 TRIG         124   06/02/2013 CHOLHDL      2.6   06/02/2013 ALT           25   06/02/2013 AST           24   06/02/2013 ALKPHOS       86   06/02/2013 BILITOT      0.5   06/02/2013 CREATININE     1.34   06/02/2013 BUN              16   06/02/2013 NA              139   06/02/2013 K               4.8   06/02/2013 CL              102   06/02/2013 CO2              31   06/02/2013 HGBA1C      5.7   06/02/2013 WBC      6.5   06/02/2013 HGB     13.8   06/02/2013 HCT     39.8   06/02/2013 MCV     86.3   06/02/2013 PLT      303   06/02/2013   Hyperlipidemia Associated symptoms include shortness of breath.  Gastrophageal Reflux    Current Outpatient Prescriptions on File Prior to Visit  Medication Sig Dispense Refill  . buPROPion (WELLBUTRIN XL) 150 MG 24 hr tablet Take 150 mg by mouth daily.      . Cholecalciferol (VITAMIN D-3) 1000 UNITS CAPS Take 2,000 Units by mouth daily.      . Multiple Vitamin (MULTIVITAMIN) tablet Take 1 tablet by mouth daily.      Marland Kitchen omeprazole (PRILOSEC) 20 MG capsule Take 1 capsule (20 mg total) by mouth daily.  30 capsule  11  . pravastatin (PRAVACHOL) 40 MG tablet TAKE 1 TABLET BY MOUTH AT BEDTIME FOR CHOLESTROL  90 tablet  3  . solifenacin (VESICARE) 5 MG tablet Take 10 mg by mouth daily.       No current facility-administered medications on file prior to visit.   No Known Allergies Past Medical History  Diagnosis Date  . COPD (chronic  obstructive pulmonary disease)   . GERD (gastroesophageal reflux disease)   . HLD (hyperlipidemia)   . IBS (irritable bowel syndrome)   . Pneumonia   . Hypertension   . Pre-diabetes   . Vitamin D deficiency   . Hematuria   . Hx of CABG      Review of Systems  Respiratory: Positive for shortness of breath.   All other systems reviewed and are negative.  BP 140/78  Pulse 74  Temp(Src) 98.6 F (37 C) (Temporal)  Resp 16  Ht 5\' 8"  (1.727 m)  Wt 186 lb (84.369 kg)  BMI 28.29 kg/m2     Objective:   Physical Exam  Nursing note and vitals reviewed. Constitutional: He is oriented to person, place, and time. He appears well-developed and well-nourished.  HENT:  Head: Normocephalic and atraumatic.  Right Ear: External ear normal.  Left Ear: External ear normal.  Nose: Nose normal.  Eyes: Conjunctivae and EOM are normal.  Neck: Normal range of motion. Neck supple. No JVD present. No thyromegaly present.  Cardiovascular: Normal rate, regular rhythm, normal heart sounds and intact distal pulses.   Pulmonary/Chest: Effort normal and breath sounds normal.  Abdominal: Soft. Bowel sounds are normal. He exhibits no distension and no mass. There is no tenderness. There is no rebound and no guarding.  Musculoskeletal: Normal range of motion. He exhibits no edema and no tenderness.  Lymphadenopathy:    He has no cervical adenopathy.  Neurological: He is alert and oriented to person, place, and time. He has normal reflexes. No cranial nerve deficit. Coordination normal.  Skin: Skin is warm and dry.  Psychiatric: He has a normal mood and affect. His behavior is normal. Judgment and thought content normal.          Assessment & Plan:  1.  3 month F/U for  Labile HTN, Cholesterol, Pre-Dm, D. Deficient. Needs healthy diet, cardio QD and obtain healthy weight. Check Labs, Check BP if >130/80 call office  2. SOB- ? Decreased lung volume with inactivity vs heart- check labs/ CXR/ consider  Vascular evaluation if all negative and symptoms conitinue with increasing activity

## 2013-09-02 NOTE — Patient Instructions (Signed)
We want weight loss that will last so you should lose 1-2 pounds a week.  THAT IS IT! Please pick THREE things a month to change. Once it is a habit check off the item. Then pick another three items off the list to become habits.  If you are already doing a habit on the list GREAT!  Cross that item off! o Don't drink your calories. Ie, alcohol, soda, fruit juice, and sweet tea.  o Drink more water. Drink a glass when you feel hungry or before each meal.  o Eat breakfast - Complex carb and protein (likeDannon light and fit yogurt, oatmeal, fruit, eggs, [place] bacon). o Measure your cereal.  Eat no more than one cup a day. (ie [place]) o Eat an apple a day. o Add a vegetable a day. o Try a new vegetable a month. o Use Pam! Stop using oil or butter to cook. o Don't finish your plate or use smaller plates. o Share your dessert. o Eat sugar free Jello for dessert or frozen grapes. o Don't eat 2-3 hours before bed. o Switch to whole wheat bread, pasta, and brown rice. o Make healthier choices when you eat out. No fries! o Pick baked chicken, NOT fried. o Don't forget to SLOW DOWN when you eat. It is not going anywhere.  o Take the stairs. o Park far away in the parking lot o [employer] (or weights) for 10 minutes while watching TV. o Walk at work for 10 minutes during break. o Walk outside 1 time a week with your friend, kids, dog, or significant other. o Start a walking group at [place] the mall as much as you can tolerate.  o Keep a food diary. o Weigh yourself daily. o Walk for 15 minutes 3 days per week. o Cook at home more often and eat out less.  If life happens and you go back to old habits, it is okay.  Just start over. You can do it!   If you experience chest pain, get short of breath, or tired during the exercise, please stop immediately and inform your doctor.  FYI Heart Failure Heart failure is a condition in which the heart has trouble pumping blood. This means your  heart does not pump blood efficiently for your body to work well. In some cases of heart failure, fluid may back up into your lungs or you may have swelling (edema) in your lower legs. Heart failure is usually a long-term (chronic) condition. It is important for you to take good care of yourself and follow your caregiver's treatment plan. CAUSES  Some health conditions can cause heart failure. Those health conditions include:  High blood pressure (hypertension) causes the heart muscle to work harder than normal. When pressure in the blood vessels is high, the heart needs to pump (contract) with more force in order to circulate blood throughout the body. High blood pressure eventually causes the heart to become stiff and weak.  Coronary artery disease (CAD) is the buildup of cholesterol and fat (plaque) in the arteries of the heart. The blockage in the arteries deprives the heart muscle of oxygen and blood. This can cause chest pain and may lead to a heart attack. High blood pressure can also contribute to CAD.  Heart attack (myocardial infarction) occurs when 1 or more arteries in the heart become blocked. The loss of oxygen damages the muscle tissue of the heart. When this happens, part of the heart muscle dies. The  injured tissue does not contract as well and weakens the heart's ability to pump blood.  Abnormal heart valves can cause heart failure when the heart valves do not open and close properly. This makes the heart muscle pump harder to keep the blood flowing.  Heart muscle disease (cardiomyopathy or myocarditis) is damage to the heart muscle from a variety of causes. These can include drug or alcohol abuse, infections, or unknown reasons. These can increase the risk of heart failure.  Lung disease makes the heart work harder because the lungs do not work properly. This can cause a strain on the heart, leading it to fail.  Diabetes increases the risk of heart failure. High blood sugar  contributes to high fat (lipid) levels in the blood. Diabetes can also cause slow damage to tiny blood vessels that carry important nutrients to the heart muscle. When the heart does not get enough oxygen and food, it can cause the heart to become weak and stiff. This leads to a heart that does not contract efficiently.  Other conditions can contribute to heart failure. These include abnormal heart rhythms, thyroid problems, and low blood counts (anemia). Certain unhealthy behaviors can increase the risk of heart failure. Those unhealthy behaviors include:  Being overweight.  Smoking or chewing tobacco.  Eating foods high in fat and cholesterol.  Abusing illicit drugs or alcohol.  Lacking physical activity. SYMPTOMS  Heart failure symptoms may vary and can be hard to detect. Symptoms may include:  Shortness of breath with activity, such as climbing stairs.  Persistent cough.  Swelling of the feet, ankles, legs, or abdomen.  Unexplained weight gain.  Difficulty breathing when lying flat (orthopnea).  Waking from sleep because of the need to sit up and get more air.  Rapid heartbeat.  Fatigue and loss of energy.  Feeling lightheaded, dizzy, or close to fainting.  Loss of appetite.  Nausea.  Increased urination during the night (nocturia). DIAGNOSIS  A diagnosis of heart failure is based on your history, symptoms, physical examination, and diagnostic tests. Diagnostic tests for heart failure may include:  Echocardiography.  Electrocardiography.  Chest X-ray.  Blood tests.  Exercise stress test.  Cardiac angiography.  Radionuclide scans. TREATMENT  Treatment is aimed at managing the symptoms of heart failure. Medicines, behavioral changes, or surgical intervention may be necessary to treat heart failure.  Medicines to help treat heart failure may include:  Angiotensin-converting enzyme (ACE) inhibitors. This type of medicine blocks the effects of a blood  protein called angiotensin-converting enzyme. ACE inhibitors relax (dilate) the blood vessels and help lower blood pressure.  Angiotensin receptor blockers. This type of medicine blocks the actions of a blood protein called angiotensin. Angiotensin receptor blockers dilate the blood vessels and help lower blood pressure.  Water pills (diuretics). Diuretics cause the kidneys to remove salt and water from the blood. The extra fluid is removed through urination. This loss of extra fluid lowers the volume of blood the heart pumps.  Beta blockers. These prevent the heart from beating too fast and improve heart muscle strength.  Digitalis. This increases the force of the heartbeat.  Healthy behavior changes include:  Obtaining and maintaining a healthy weight.  Stopping smoking or chewing tobacco.  Eating heart healthy foods.  Limiting or avoiding alcohol.  Stopping illicit drug use.  Physical activity as directed by your caregiver.  Surgical treatment for heart failure may include:  A procedure to open blocked arteries, repair damaged heart valves, or remove damaged heart muscle tissue.  A pacemaker to improve heart muscle function and control certain abnormal heart rhythms.  An internal cardioverter defibrillator to treat certain serious abnormal heart rhythms.  A left ventricular assist device to assist the pumping ability of the heart. HOME CARE INSTRUCTIONS   Take your medicine as directed by your caregiver. Medicines are important in reducing the workload of your heart, slowing the progression of heart failure, and improving your symptoms.  Do not stop taking your medicine unless directed by your caregiver.  Do not skip any dose of medicine.  Refill your prescriptions before you run out of medicine. Your medicines are needed every day.  Take over-the-counter medicine only as directed by your caregiver or pharmacist.  Engage in moderate physical activity if directed by  your caregiver. Moderate physical activity can benefit some people. The elderly and people with severe heart failure should consult with a caregiver for physical activity recommendations.  Eat heart healthy foods. Food choices should be free of trans fat and low in saturated fat, cholesterol, and salt (sodium). Healthy choices include fresh or frozen fruits and vegetables, fish, lean meats, legumes, fat-free or low-fat dairy products, and whole grain or high fiber foods. Talk to a dietitian to learn more about heart healthy foods.  Limit sodium if directed by your caregiver. Sodium restriction may reduce symptoms of heart failure in some people. Talk to a dietitian to learn more about heart healthy seasonings.  Use healthy cooking methods. Healthy cooking methods include roasting, grilling, broiling, baking, poaching, steaming, or stir-frying. Talk to a dietitian to learn more about healthy cooking methods.  Limit fluids if directed by your caregiver. Fluid restriction may reduce symptoms of heart failure in some people.  Weigh yourself every day. Daily weights are important in the early recognition of excess fluid. You should weigh yourself every morning after you urinate and before you eat breakfast. Wear the same amount of clothing each time you weigh yourself. Record your daily weight. Provide your caregiver with your weight record.  Monitor and record your blood pressure if directed by your caregiver.  Check your pulse if directed by your caregiver.  Lose weight if directed by your caregiver. Weight loss may reduce symptoms of heart failure in some people.  Stop smoking or chewing tobacco. Nicotine makes your heart work harder by causing your blood vessels to constrict. Do not use nicotine gum or patches before talking to your caregiver.  Schedule and attend follow-up visits as directed by your caregiver. It is important to keep all your appointments.  Limit alcohol intake to no more than  1 drink per day for nonpregnant women and 2 drinks per day for men. Drinking more than that is harmful to your heart. Tell your caregiver if you drink alcohol several times a week. Talk with your caregiver about whether alcohol is safe for you. If your heart has already been damaged by alcohol or you have severe heart failure, drinking alcohol should be stopped completely.  Stop illicit drug use.  Stay up-to-date with immunizations. It is especially important to prevent respiratory infections through current pneumococcal and influenza immunizations.  Manage other health conditions such as hypertension, diabetes, thyroid disease, or abnormal heart rhythms as directed by your caregiver.  Learn to manage stress.  Plan rest periods when fatigued.  Learn strategies to manage high temperatures. If the weather is extremely hot:  Avoid vigorous physical activity.  Use air conditioning or fans or seek a cooler location.  Avoid caffeine and alcohol.  Wear loose-fitting,  lightweight, and light-colored clothing.  Learn strategies to manage cold temperatures. If the weather is extremely cold:  Avoid vigorous physical activity.  Layer clothes.  Wear mittens or gloves, a hat, and a scarf when going outside.  Avoid alcohol.  Obtain ongoing education and support as needed.  Participate or seek rehabilitation as needed to maintain or improve independence and quality of life. SEEK MEDICAL CARE IF:   Your weight increases by 03 lb/1.4 kg in 1 day or 05 lb/2.3 kg in a week.  You have increasing shortness of breath that is unusual for you.  You are unable to participate in your usual physical activities.  You tire easily.  You cough more than normal, especially with physical activity.  You have any or more swelling in areas such as your hands, feet, ankles, or abdomen.  You are unable to sleep because it is hard to breathe.  You feel like your heart is beating fast (palpitations).  You  become dizzy or lightheaded upon standing up. SEEK IMMEDIATE MEDICAL CARE IF:   You have difficulty breathing.  There is a change in mental status such as decreased alertness or difficulty with concentration.  You have a pain or discomfort in your chest.  You have an episode of fainting (syncope). MAKE SURE YOU:   Understand these instructions.  Will watch your condition.  Will get help right away if you are not doing well or get worse. Document Released: 06/09/2005 Document Revised: 10/04/2012 Document Reviewed: 07/01/2012 [facility] Patient Information 2014 [place], [place]. Hypertension Hypertension is another name for high blood pressure. High blood pressure may mean that your heart needs to work harder to pump blood. Blood pressure consists of two numbers, which includes a higher number over a lower number (example: 110/72). HOME CARE   Make lifestyle changes as told by your doctor. This may include weight loss and exercise.  Take your blood pressure medicine every day.  Limit how much salt you use.  Stop smoking if you smoke.  Do not use drugs.  Talk to your doctor if you are using decongestants or birth control pills. These medicines might make blood pressure higher.  Females should not drink more than 1 alcoholic drink per day. Males should not drink more than 2 alcoholic drinks per day.  See your doctor as told. GET HELP RIGHT AWAY IF:   You have a blood pressure reading with a top number of 180 or higher.  You get a very bad headache.  You get blurred or changing vision.  You feel confused.  You feel weak, numb, or faint.  You get chest or belly (abdominal) pain.  You throw up (vomit).  You cannot breathe very well. MAKE SURE YOU:   Understand these instructions.  Will watch your condition.  Will get help right away if you are not doing well or get worse. Document Released: 11/26/2007 Document Revised: 09/01/2011 Document Reviewed:  11/26/2007 [facility] Patient Information 2014 [place], [place].

## 2013-09-03 LAB — LIPID PANEL
CHOL/HDL RATIO: 2.4 ratio
CHOLESTEROL: 139 mg/dL (ref 0–200)
HDL: 57 mg/dL (ref >39)
LDL Cholesterol: 66 mg/dL (ref 0–99)
TRIGLYCERIDES: 79 mg/dL (ref <150)
VLDL: 16 mg/dL (ref 0–40)

## 2013-09-03 LAB — BASIC METABOLIC PANEL WITH GFR
BUN: 18 mg/dL (ref 6–23)
CALCIUM: 10.5 mg/dL (ref 8.4–10.5)
CO2: 29 mEq/L (ref 19–32)
CREATININE: 1.42 mg/dL — AB (ref 0.50–1.35)
Chloride: 104 mEq/L (ref 96–112)
GFR, EST AFRICAN AMERICAN: 60 mL/min
GFR, EST NON AFRICAN AMERICAN: 52 mL/min — AB
Glucose, Bld: 83 mg/dL (ref 70–99)
POTASSIUM: 5.9 meq/L — AB (ref 3.5–5.3)
Sodium: 144 mEq/L (ref 135–145)

## 2013-09-03 LAB — HEPATIC FUNCTION PANEL
ALT: 26 U/L (ref 0–53)
AST: 28 U/L (ref 0–37)
Albumin: 4.4 g/dL (ref 3.5–5.2)
Alkaline Phosphatase: 91 U/L (ref 39–117)
BILIRUBIN DIRECT: 0.1 mg/dL (ref 0.0–0.3)
BILIRUBIN TOTAL: 0.4 mg/dL (ref 0.2–1.2)
Indirect Bilirubin: 0.3 mg/dL (ref 0.2–1.2)
Total Protein: 7.1 g/dL (ref 6.0–8.3)

## 2013-09-03 LAB — INSULIN, FASTING: Insulin fasting, serum: 15 u[IU]/mL (ref 3–28)

## 2013-09-03 LAB — BRAIN NATRIURETIC PEPTIDE: Brain Natriuretic Peptide: 15 pg/mL (ref 0.0–100.0)

## 2013-12-05 ENCOUNTER — Encounter: Payer: Self-pay | Admitting: Internal Medicine

## 2013-12-05 ENCOUNTER — Ambulatory Visit (INDEPENDENT_AMBULATORY_CARE_PROVIDER_SITE_OTHER): Payer: Federal, State, Local not specified - PPO | Admitting: Internal Medicine

## 2013-12-05 VITALS — BP 138/80 | HR 80 | Temp 98.2°F | Resp 16 | Ht 68.0 in | Wt 185.8 lb

## 2013-12-05 DIAGNOSIS — R7401 Elevation of levels of liver transaminase levels: Secondary | ICD-10-CM

## 2013-12-05 DIAGNOSIS — R7402 Elevation of levels of lactic acid dehydrogenase (LDH): Secondary | ICD-10-CM

## 2013-12-05 DIAGNOSIS — Z79899 Other long term (current) drug therapy: Secondary | ICD-10-CM

## 2013-12-05 DIAGNOSIS — I10 Essential (primary) hypertension: Secondary | ICD-10-CM

## 2013-12-05 DIAGNOSIS — R740 Nonspecific elevation of levels of transaminase and lactic acid dehydrogenase [LDH]: Secondary | ICD-10-CM

## 2013-12-05 DIAGNOSIS — E559 Vitamin D deficiency, unspecified: Secondary | ICD-10-CM

## 2013-12-05 DIAGNOSIS — R7309 Other abnormal glucose: Secondary | ICD-10-CM

## 2013-12-05 DIAGNOSIS — Z111 Encounter for screening for respiratory tuberculosis: Secondary | ICD-10-CM

## 2013-12-05 DIAGNOSIS — Z0000 Encounter for general adult medical examination without abnormal findings: Secondary | ICD-10-CM

## 2013-12-05 DIAGNOSIS — Z113 Encounter for screening for infections with a predominantly sexual mode of transmission: Secondary | ICD-10-CM

## 2013-12-05 DIAGNOSIS — Z1212 Encounter for screening for malignant neoplasm of rectum: Secondary | ICD-10-CM

## 2013-12-05 DIAGNOSIS — Z125 Encounter for screening for malignant neoplasm of prostate: Secondary | ICD-10-CM

## 2013-12-05 LAB — CBC WITH DIFFERENTIAL/PLATELET
BASOS ABS: 0 10*3/uL (ref 0.0–0.1)
Basophils Relative: 0 % (ref 0–1)
EOS ABS: 0.2 10*3/uL (ref 0.0–0.7)
EOS PCT: 3 % (ref 0–5)
HCT: 38.3 % — ABNORMAL LOW (ref 39.0–52.0)
Hemoglobin: 13.2 g/dL (ref 13.0–17.0)
LYMPHS ABS: 1.2 10*3/uL (ref 0.7–4.0)
LYMPHS PCT: 24 % (ref 12–46)
MCH: 29.9 pg (ref 26.0–34.0)
MCHC: 34.5 g/dL (ref 30.0–36.0)
MCV: 86.8 fL (ref 78.0–100.0)
Monocytes Absolute: 0.6 10*3/uL (ref 0.1–1.0)
Monocytes Relative: 11 % (ref 3–12)
Neutro Abs: 3.2 10*3/uL (ref 1.7–7.7)
Neutrophils Relative %: 62 % (ref 43–77)
PLATELETS: 272 10*3/uL (ref 150–400)
RBC: 4.41 MIL/uL (ref 4.22–5.81)
RDW: 14.1 % (ref 11.5–15.5)
WBC: 5.1 10*3/uL (ref 4.0–10.5)

## 2013-12-05 NOTE — Progress Notes (Signed)
Patient ID: [name] Park, male   DOB: 1950/08/22, 63 y.o.   MRN: [id number]   Annual Screening Comprehensive Examination  This very nice 63 y.o.MWM presents for complete physical.  Patient has been followed for HTN, ASCAD s/p CABG Prediabetes, Hyperlipidemia, and Vitamin D Deficiency.   Labile HTN predates since 2004 and has been monitored expectantly. Patient's BP has been controlled at home.Today's BP: 138/80 mmHg.  Patient had 2 negative heart cath in 2005 and 2011. He also had a normal 2D EC in 2011. Patient denies any cardiac symptoms as chest pain, palpitations, shortness of breath, dizziness or ankle swelling.   Patient's hyperlipidemia is controlled with diet and medications. Patient denies myalgias or other medication SE's. Last Lipids in Mar 2015 as below were at goal.  Lab Results  Component Value Date   CHOL 139 09/02/2013   HDL 57 09/02/2013   LDLCALC 66 09/02/2013   TRIG 79 09/02/2013   CHOLHDL 2.4 09/02/2013    Patient has prediabetes with A1c 5.9% in Jan 2014and last A1c was 5.4% in Mar 2015. Patient denies reactive hypoglycemic symptoms, visual blurring, diabetic polys or paresthesias.  Lab Results  Component Value Date   HGBA1C 5.4 09/02/2013    Finally, patient has history of Vitamin D Deficiency of 31 in 2011 and last vitamin D 77 in Dec 2104.  Medication Sig  . WELLBUTRIN XL 150 MG  Take 150 mg by mouth daily.  Marland Kitchen VITAMIN D 1000 UNITS  Take 2,000 Units by mouth daily.  . MULTIVITAMIN tablet Take 1 tablet by mouth daily.  Marland Kitchen omeprazole 20 MG capsule Take 1 capsule (20 mg total) by mouth daily.  . pravastatin  40 MG tablet TAKE 1 TABLET BY MOUTH AT BEDTIME FOR CHOLESTROL   No Known Allergies  Past Medical History  Diagnosis Date  . COPD (chronic obstructive pulmonary disease)   . GERD (gastroesophageal reflux disease)   . HLD (hyperlipidemia)   . IBS (irritable bowel syndrome)   . Pneumonia   . Hypertension   . Pre-diabetes   . Vitamin D deficiency   .  Hematuria   . Hx of CABG     Past Surgical History  Procedure Laterality Date  . Cervical fusion    . Trigger finger release Right     thumb  . Carpal tunnel release Right   . Hand surgery Left    Family History  Problem Relation Age of Onset  . Irritable bowel syndrome Mother   . Diverticulosis Mother   . Lupus Mother   . COPD Mother   . Hypertension Mother   . Lung cancer Father   . Other Father     bladder polyps  . Stroke Father   . Kidney disease Father   . Cancer Father    History   Social History  . Marital Status: Married    Spouse Name: N/A    Number of Children: 1  . Years of Education: N/A   Occupational History  . retired    Social History Main Topics  . Smoking status: Former Smoker -- 1.50 packs/day for 35 years    Types: Cigarettes    Quit date: 02/05/2004  . Smokeless tobacco: Never Used  . Alcohol Use: No  . Drug Use: No  . Sexual Activity: Not on file   Other Topics Concern    ROS Constitutional: Denies fever, chills, weight loss/gain, headaches, insomnia, fatigue, night sweats or change in appetite. Eyes: Denies redness, blurred vision, diplopia, discharge, itchy  or watery eyes.  ENT: Denies discharge, congestion, post nasal drip, epistaxis, sore throat, earache, hearing loss, dental pain, Tinnitus, Vertigo, Sinus pain or snoring.  Cardio: Denies chest pain, palpitations, irregular heartbeat, syncope, dyspnea, diaphoresis, orthopnea, PND, claudication or edema Respiratory: denies cough, dyspnea, DOE, pleurisy, hoarseness, laryngitis or wheezing.  Gastrointestinal: Denies dysphagia, heartburn, reflux, water brash, pain, cramps, nausea, vomiting, bloating, diarrhea, constipation, hematemesis, melena, hematochezia, jaundice or hemorrhoids Genitourinary: Denies dysuria, frequency, urgency, nocturia, hesitancy, discharge, hematuria or flank pain Musculoskeletal: Denies arthralgia, myalgia, stiffness, Jt. Swelling, pain, limp or  strain/sprain. Skin: Denies puritis, rash, hives, warts, acne, eczema or change in skin lesion Neuro: No weakness, tremor, incoordination, spasms, paresthesia or pain Psychiatric: Denies confusion, memory loss or sensory loss Endocrine: Denies change in weight, skin, hair change, nocturia, and paresthesia, diabetic polys, visual blurring or hyper / hypo glycemic episodes.  Heme/Lymph: No excessive bleeding, bruising or enlarged lymph nodes.  Physical Exam  BP 138/80  Pulse 80  Temp 98.2 F   Resp 16  Ht 5\' 8"    Wt 185 lb 12.8 oz   BMI 28.26 kg/m2  General Appearance: Well nourished, in no apparent distress. Eyes: PERRLA, EOMs, conjunctiva no swelling or erythema, normal fundi and vessels. Sinuses: No frontal/maxillary tenderness ENT/Mouth: EACs patent / TMs  nl. Nares clear without erythema, swelling, mucoid exudates. Oral hygiene is good. No erythema, swelling, or exudate. Tongue normal, non-obstructing. Tonsils not swollen or erythematous. Hearing normal.  Neck: Supple, thyroid normal. No bruits, nodes or JVD. Respiratory: Respiratory effort normal.  BS equal and clear bilateral without rales, rhonci, wheezing or stridor. Cardio: Heart sounds are normal with regular rate and rhythm and no murmurs, rubs or gallops. Peripheral pulses are normal and equal bilaterally without edema. No aortic or femoral bruits. Chest: symmetric with normal excursions and percussion.  Abdomen: Flat, soft, with bowl sounds. Nontender, no guarding, rebound, hernias, masses, or organomegaly.  Lymphatics: Non tender without lymphadenopathy.  Genitourinary: No hernias.Testes nl. DRE - prostate nl for age - smooth & firm w/o nodules. Musculoskeletal: Full ROM all peripheral extremities, joint stability, 5/5 strength, and normal gait. Skin: Warm and dry without rashes, lesions, cyanosis, clubbing or  ecchymosis.  Neuro: Cranial nerves intact, reflexes equal bilaterally. Normal muscle tone, no cerebellar symptoms.  Sensation intact.  Pysch: Awake and oriented X 3, normal affect, insight and judgment appropriate.   Assessment and Plan  1. Annual Screening Examination 2. Hypertension  3. Hyperlipidemia 4. Pre Diabetes 5. Vitamin D Deficiency  Continue prudent diet as discussed, weight control, BP monitoring, regular exercise, and medications as discussed.  Discussed med effects and SE's. Routine screening labs and tests as requested with regular follow-up as recommended.

## 2013-12-05 NOTE — Patient Instructions (Signed)

## 2013-12-06 LAB — MICROALBUMIN / CREATININE URINE RATIO
CREATININE, URINE: 111.9 mg/dL
Microalb Creat Ratio: 6.7 mg/g (ref 0.0–30.0)
Microalb, Ur: 0.75 mg/dL (ref 0.00–1.89)

## 2013-12-06 LAB — BASIC METABOLIC PANEL WITH GFR
BUN: 22 mg/dL (ref 6–23)
CALCIUM: 9.8 mg/dL (ref 8.4–10.5)
CO2: 21 mEq/L (ref 19–32)
CREATININE: 1.34 mg/dL (ref 0.50–1.35)
Chloride: 102 mEq/L (ref 96–112)
GFR, EST NON AFRICAN AMERICAN: 56 mL/min — AB
GFR, Est African American: 64 mL/min
Glucose, Bld: 95 mg/dL (ref 70–99)
Potassium: 4.4 mEq/L (ref 3.5–5.3)
SODIUM: 137 meq/L (ref 135–145)

## 2013-12-06 LAB — URINALYSIS, MICROSCOPIC ONLY
Bacteria, UA: NONE SEEN
Casts: NONE SEEN
Crystals: NONE SEEN
Squamous Epithelial / LPF: NONE SEEN

## 2013-12-06 LAB — VITAMIN B12: Vitamin B-12: 371 pg/mL (ref 211–911)

## 2013-12-06 LAB — HEPATITIS A ANTIBODY, TOTAL: HEP A TOTAL AB: NONREACTIVE

## 2013-12-06 LAB — LIPID PANEL
CHOL/HDL RATIO: 2.3 ratio
Cholesterol: 143 mg/dL (ref 0–200)
HDL: 62 mg/dL (ref >39)
LDL Cholesterol: 61 mg/dL (ref 0–99)
TRIGLYCERIDES: 102 mg/dL (ref <150)
VLDL: 20 mg/dL (ref 0–40)

## 2013-12-06 LAB — HEPATIC FUNCTION PANEL
ALBUMIN: 4.1 g/dL (ref 3.5–5.2)
ALT: 19 U/L (ref 0–53)
AST: 22 U/L (ref 0–37)
Alkaline Phosphatase: 86 U/L (ref 39–117)
BILIRUBIN DIRECT: 0.1 mg/dL (ref 0.0–0.3)
BILIRUBIN TOTAL: 0.4 mg/dL (ref 0.2–1.2)
Indirect Bilirubin: 0.3 mg/dL (ref 0.2–1.2)
Total Protein: 6.5 g/dL (ref 6.0–8.3)

## 2013-12-06 LAB — HEPATITIS B SURFACE ANTIBODY,QUALITATIVE: HEP B S AB: NEGATIVE

## 2013-12-06 LAB — HEPATITIS B CORE ANTIBODY, TOTAL: Hep B Core Total Ab: NONREACTIVE

## 2013-12-06 LAB — VITAMIN D 25 HYDROXY (VIT D DEFICIENCY, FRACTURES): VIT D 25 HYDROXY: 75 ng/mL (ref 30–89)

## 2013-12-06 LAB — HEMOGLOBIN A1C
Hgb A1c MFr Bld: 5.7 % — ABNORMAL HIGH (ref <5.7)
Mean Plasma Glucose: 117 mg/dL — ABNORMAL HIGH (ref <117)

## 2013-12-06 LAB — PSA: PSA: 1.24 ng/mL (ref <=4.00)

## 2013-12-06 LAB — INSULIN, FASTING: Insulin fasting, serum: 60 u[IU]/mL — ABNORMAL HIGH (ref 3–28)

## 2013-12-06 LAB — RPR

## 2013-12-06 LAB — TSH: TSH: 0.71 u[IU]/mL (ref 0.350–4.500)

## 2013-12-06 LAB — HEPATITIS C ANTIBODY: HCV AB: NEGATIVE

## 2013-12-06 LAB — MAGNESIUM: MAGNESIUM: 1.7 mg/dL (ref 1.5–2.5)

## 2013-12-06 LAB — HIV ANTIBODY (ROUTINE TESTING W REFLEX): HIV: NONREACTIVE

## 2013-12-06 LAB — TESTOSTERONE: Testosterone: 266 ng/dL — ABNORMAL LOW (ref 300–890)

## 2013-12-07 LAB — HEPATITIS B E ANTIBODY: Hepatitis Be Antibody: NONREACTIVE

## 2013-12-08 LAB — TB SKIN TEST
INDURATION: 0 mm
TB Skin Test: NEGATIVE

## 2013-12-09 ENCOUNTER — Other Ambulatory Visit: Payer: Self-pay | Admitting: Physician Assistant

## 2013-12-22 ENCOUNTER — Other Ambulatory Visit (INDEPENDENT_AMBULATORY_CARE_PROVIDER_SITE_OTHER): Payer: Federal, State, Local not specified - PPO

## 2013-12-22 DIAGNOSIS — Z1212 Encounter for screening for malignant neoplasm of rectum: Secondary | ICD-10-CM

## 2013-12-22 LAB — POC HEMOCCULT BLD/STL (HOME/3-CARD/SCREEN)
Card #2 Fecal Occult Blod, POC: NEGATIVE
Card #3 Fecal Occult Blood, POC: NEGATIVE
Fecal Occult Blood, POC: NEGATIVE

## 2014-01-24 ENCOUNTER — Other Ambulatory Visit: Payer: Self-pay | Admitting: Gastroenterology

## 2014-01-24 NOTE — Telephone Encounter (Signed)
NEEDS OFFICE VISIT FOR ANY FURTHER REFILLS! 

## 2014-03-07 ENCOUNTER — Other Ambulatory Visit: Payer: Self-pay | Admitting: Gastroenterology

## 2014-03-07 NOTE — Telephone Encounter (Signed)
NEEDS OFFICE VISIT FOR ANY FURTHER REFILLS! 

## 2014-03-08 ENCOUNTER — Ambulatory Visit (INDEPENDENT_AMBULATORY_CARE_PROVIDER_SITE_OTHER): Payer: Federal, State, Local not specified - PPO | Admitting: Internal Medicine

## 2014-03-08 ENCOUNTER — Encounter: Payer: Self-pay | Admitting: Internal Medicine

## 2014-03-08 VITALS — BP 112/80 | HR 80 | Temp 98.1°F | Resp 16 | Ht 68.0 in | Wt 185.2 lb

## 2014-03-08 DIAGNOSIS — E559 Vitamin D deficiency, unspecified: Secondary | ICD-10-CM

## 2014-03-08 DIAGNOSIS — Z79899 Other long term (current) drug therapy: Secondary | ICD-10-CM

## 2014-03-08 DIAGNOSIS — K5732 Diverticulitis of large intestine without perforation or abscess without bleeding: Secondary | ICD-10-CM

## 2014-03-08 DIAGNOSIS — K5790 Diverticulosis of intestine, part unspecified, without perforation or abscess without bleeding: Secondary | ICD-10-CM | POA: Insufficient documentation

## 2014-03-08 DIAGNOSIS — N3281 Overactive bladder: Secondary | ICD-10-CM

## 2014-03-08 DIAGNOSIS — E782 Mixed hyperlipidemia: Secondary | ICD-10-CM

## 2014-03-08 DIAGNOSIS — K2100 Gastro-esophageal reflux disease with esophagitis, without bleeding: Secondary | ICD-10-CM

## 2014-03-08 DIAGNOSIS — I10 Essential (primary) hypertension: Secondary | ICD-10-CM

## 2014-03-08 DIAGNOSIS — K219 Gastro-esophageal reflux disease without esophagitis: Secondary | ICD-10-CM | POA: Insufficient documentation

## 2014-03-08 DIAGNOSIS — R7309 Other abnormal glucose: Secondary | ICD-10-CM

## 2014-03-08 DIAGNOSIS — N318 Other neuromuscular dysfunction of bladder: Secondary | ICD-10-CM

## 2014-03-08 LAB — CBC WITH DIFFERENTIAL/PLATELET
Basophils Absolute: 0 10*3/uL (ref 0.0–0.1)
Basophils Relative: 0 % (ref 0–1)
Eosinophils Absolute: 0.1 10*3/uL (ref 0.0–0.7)
Eosinophils Relative: 2 % (ref 0–5)
HCT: 39.9 % (ref 39.0–52.0)
HEMOGLOBIN: 13.7 g/dL (ref 13.0–17.0)
LYMPHS ABS: 1.1 10*3/uL (ref 0.7–4.0)
LYMPHS PCT: 19 % (ref 12–46)
MCH: 30.2 pg (ref 26.0–34.0)
MCHC: 34.3 g/dL (ref 30.0–36.0)
MCV: 88.1 fL (ref 78.0–100.0)
MONOS PCT: 8 % (ref 3–12)
Monocytes Absolute: 0.5 10*3/uL (ref 0.1–1.0)
NEUTROS PCT: 71 % (ref 43–77)
Neutro Abs: 4 10*3/uL (ref 1.7–7.7)
Platelets: 279 10*3/uL (ref 150–400)
RBC: 4.53 MIL/uL (ref 4.22–5.81)
RDW: 13.8 % (ref 11.5–15.5)
WBC: 5.7 10*3/uL (ref 4.0–10.5)

## 2014-03-08 MED ORDER — CIPROFLOXACIN HCL 500 MG PO TABS: 500.0000 mg | ORAL_TABLET | Freq: Two times a day (BID) | ORAL | Status: AC

## 2014-03-08 MED ORDER — METRONIDAZOLE 250 MG PO TABS: 250.0000 mg | ORAL_TABLET | Freq: Three times a day (TID) | ORAL | Status: AC

## 2014-03-08 NOTE — Progress Notes (Signed)
Patient ID: [name] Park, male   DOB: 1951/02/15, 63 y.o.   MRN: [id number]   This very nice 63 y.o.MWM presents for 3 month follow up with Labile Hypertension, Hyperlipidemia, Pre-Diabetes and Vitamin D Deficiency. Today he's also c/o 3-4 da hx/o LLQ pains. No N/V D/C , fever or chills.   Patient is monitored expectantly for HTN & BP has been controlled and today's BP: 112/80 mmHg. Patient denies any cardiac type chest pain, palpitations, dyspnea/orthopnea/PND, dizziness, claudication, or dependent edema.  Hyperlipidemia is controlled with diet & meds. Patient denies myalgias or other med SE's. Last Lipids were Total Chol 143; HDL  62; LDL 61; Trig 102 on 12/05/2013.   Also, the patient has history of PreDiabetes (A1c 6.2% in Feb 2011)  and patient denies any symptoms of reactive hypoglycemia, diabetic polys, paresthesias or visual blurring.  Last A1c was  5.7% on 12/05/2013.    Further, Patient has history of Vitamin D Deficiency (31 in 2011) and patient supplements vitamin D without any suspected side-effects. Last vitamin D was 75 on  12/05/2013.   Medication List   buPROPion 150 MG 24 hr tablet  Commonly known as:  WELLBUTRIN XL  Take 150 mg by mouth daily.     multivitamin tablet  Take 1 tablet by mouth daily.     omeprazole 20 MG capsule  Commonly known as:  PRILOSEC  TAKE ONE CAPSULE BY MOUTH EVERY DAY     pravastatin 40 MG tablet  Commonly known as:  PRAVACHOL  TAKE 1 TABLET BY MOUTH AT BEDTIME FOR CHOLESTROL     VESICARE 10 MG tablet  Generic drug:  solifenacin  TAKE 1 TABLET BY MOUTH EVERY DAY AS DIRECTED     Vitamin D-3 1000 UNITS Caps  Take 2,000 Units by mouth daily.     No Known Allergies  PMHx:   Past Medical History  Diagnosis Date  . COPD (chronic obstructive pulmonary disease)   . GERD (gastroesophageal reflux disease)   . HLD (hyperlipidemia)   . IBS (irritable bowel syndrome)   . Pneumonia   . Hypertension   . Pre-diabetes   . Vitamin D deficiency    . Hematuria   . Hx of CABG    FHx:    Reviewed / unchanged SHx:    Reviewed / unchanged  Systems Review:  Constitutional: Denies fever, chills, wt changes, headaches, insomnia, fatigue, night sweats, change in appetite. Eyes: Denies redness, blurred vision, diplopia, discharge, itchy, watery eyes.  ENT: Denies discharge, congestion, post nasal drip, epistaxis, sore throat, earache, hearing loss, dental pain, tinnitus, vertigo, sinus pain, snoring.  CV: Denies chest pain, palpitations, irregular heartbeat, syncope, dyspnea, diaphoresis, orthopnea, PND, claudication or edema. Respiratory: denies cough, dyspnea, DOE, pleurisy, hoarseness, laryngitis, wheezing.  Gastrointestinal: Denies dysphagia, odynophagia, heartburn, reflux, water brash, nausea, vomiting, bloating, diarrhea, constipation, hematemesis, melena, hematochezia  or hemorrhoids. Genitourinary: Denies dysuria, frequency, urgency, nocturia, hesitancy, discharge, hematuria or flank pain. Musculoskeletal: Denies arthralgias, myalgias, stiffness, jt. swelling, pain, limping or strain/sprain.  Skin: Denies pruritus, rash, hives, warts, acne, eczema or change in skin lesion(s). Neuro: No weakness, tremor, incoordination, spasms, paresthesia or pain. Psychiatric: Denies confusion, memory loss or sensory loss. Endo: Denies change in weight, skin or hair change.  Heme/Lymph: No excessive bleeding, bruising or enlarged lymph nodes.  Exam:  BP 112/80  Pulse 80  Temp(Src) 98.1 F (36.7 C) (Temporal)  Resp 16  Ht 5\' 8"  (1.727 m)  Wt 185 lb 3.2 oz (84.006 kg)  BMI 28.17 kg/m2  Appears well nourished and in no distress. Eyes: PERRLA, EOMs, conjunctiva no swelling or erythema. Sinuses: No frontal/maxillary tenderness ENT/Mouth: EAC's clear, TM's nl w/o erythema, bulging. Nares clear w/o erythema, swelling, exudates. Oropharynx clear without erythema or exudates. Oral hygiene is good. Tongue normal, non obstructing. Hearing intact.   Neck: Supple. Thyroid nl. Car 2+/2+ without bruits, nodes or JVD. Chest: Respirations nl with BS clear & equal w/o rales, rhonchi, wheezing or stridor.  Cor: Heart sounds normal w/ regular rate and rhythm without sig. murmurs, gallops, clicks, or rubs. Peripheral pulses normal and equal  without edema.  Abdomen: Soft & bowel sounds normal. Slight tender LLQ w/o guarding, rebound, hernias, masses, or organomegaly.  Lymphatics: Unremarkable.  Musculoskeletal: Full ROM all peripheral extremities, joint stability, 5/5 strength, and normal gait.  Skin: Warm, dry without exposed rashes, lesions or ecchymosis apparent.  Neuro: Cranial nerves intact, reflexes equal bilaterally. Sensory-motor testing grossly intact. Tendon reflexes grossly intact.  Pysch: Alert & oriented x 3.  Insight and judgement nl & appropriate. No ideations.  Assessment and Plan:  1. Labile Hypertension - Continue monitor blood pressure at home. Continue diet/meds same.  2. Hyperlipidemia - Continue diet/meds, exercise,& lifestyle modifications. Continue monitor periodic cholesterol/liver & renal functions   3. Pre-Diabetes - Continue diet, exercise, lifestyle modifications. Monitor appropriate labs.   4. Vitamin D Deficiency - Continue supplementation.  5. Prob mild Diverticulitis - Rx Cipro 500 mg bid x 10 da & Flagyl 250 mg tid X 10 days.  Recommended regular exercise, BP monitoring, weight control, and discussed med and SE's. Recommended labs to assess and monitor clinical status. Further disposition pending results of labs.

## 2014-03-08 NOTE — Patient Instructions (Addendum)
Recommend the book "The END of DIETING" by Dr Baker Janus   and the book "The END of DIABETES " by Dr Excell Seltzer  At [email or web address] - get book & Audio CD's      Being diabetic has a  300% increased risk for heart attack, stroke, cancer, and alzheimer- type vascular dementia. It is very important that you work harder with diet by avoiding all foods that are white except chicken & fish. Avoid white rice (brown & wild rice is OK), white potatoes (sweetpotatoes in moderation is OK), White bread or wheat bread or anything made out of white flour like bagels, donuts, rolls, buns, biscuits, cakes, pastries, cookies, pizza crust, and pasta (made from white flour & egg whites) - vegetarian pasta or spinach or wheat pasta is OK. Multigrain breads like Arnold's or Pepperidge Farm, or multigrain sandwich thins or flatbreads.  Diet, exercise and weight loss can reverse and cure diabetes in the early stages.  Diet, exercise and weight loss is very important in the control and prevention of complications of diabetes which affects every system in your body, ie. Brain - dementia/stroke, eyes - glaucoma/blindness, heart - heart attack/heart failure, kidneys - dialysis, stomach - gastric paralysis, intestines - malabsorption, nerves - severe painful neuritis, circulation - gangrene & loss of a leg(s), and finally cancer and Alzheimers.    I recommend avoid fried & greasy foods,  sweets/candy, white rice (brown or wild rice or Quinoa is OK), white potatoes (sweet potatoes are OK) - anything made from white flour - bagels, doughnuts, rolls, buns, biscuits,white and wheat breads, pizza crust and traditional pasta made of white flour & egg white(vegetarian pasta or spinach or wheat pasta is OK).  Multi-grain bread is OK - like multi-grain flat bread or sandwich thins. Avoid alcohol in excess. Exercise is also important.    Eat all the vegetables you want - avoid meat, especially red meat and dairy - especially cheese.  Cheese  is the most concentrated form of trans-fats which is the worst thing to clog up our arteries. Veggie cheese is OK which can be found in the fresh produce section at [employer] or [employer] or [employer]  ++++++++++++++++++++++++++++++++++++++++++++++  Diverticulitis Diverticulitis is when small pockets that have formed in your colon (large intestine) become infected or swollen. HOME CARE  Follow your doctor's instructions.  Follow a special diet if told by your doctor.  When you feel better, your doctor may tell you to change your diet. You may be told to eat a lot of fiber. Fruits and vegetables are good sources of fiber. Fiber makes it easier to poop (have bowel movements).  Take supplements or probiotics as told by your doctor.  Only take medicines as told by your doctor.  Keep all follow-up visits with your doctor. GET HELP IF:  Your pain does not get better.  You have a hard time eating food.  You are not pooping like normal. GET HELP RIGHT AWAY IF:  Your pain gets worse.  Your problems do not get better.  Your problems suddenly get worse.  You have a fever.  You keep throwing up (vomiting).  You have bloody or black, tarry poop (stool). MAKE SURE YOU:   Understand these instructions.  Will watch your condition.  Will get help right away if you are not doing well or get worse. Document Released: 11/26/2007 Document Revised: 06/14/2013 Document Reviewed: 05/04/2013 [facility] Patient Information 2015 [place], [place]. This information is not intended to replace advice given  to you by your health care provider. Make sure you discuss any questions you have with your health care provider.  

## 2014-03-09 LAB — HEPATIC FUNCTION PANEL
ALK PHOS: 100 U/L (ref 39–117)
ALT: 26 U/L (ref 0–53)
AST: 25 U/L (ref 0–37)
Albumin: 4.1 g/dL (ref 3.5–5.2)
BILIRUBIN DIRECT: 0.1 mg/dL (ref 0.0–0.3)
BILIRUBIN INDIRECT: 0.4 mg/dL (ref 0.2–1.2)
BILIRUBIN TOTAL: 0.5 mg/dL (ref 0.2–1.2)
Total Protein: 6.7 g/dL (ref 6.0–8.3)

## 2014-03-09 LAB — BASIC METABOLIC PANEL WITH GFR
BUN: 18 mg/dL (ref 6–23)
CHLORIDE: 99 meq/L (ref 96–112)
CO2: 29 meq/L (ref 19–32)
Calcium: 9.8 mg/dL (ref 8.4–10.5)
Creat: 1.38 mg/dL — ABNORMAL HIGH (ref 0.50–1.35)
GFR, Est African American: 62 mL/min
GFR, Est Non African American: 54 mL/min — ABNORMAL LOW
GLUCOSE: 143 mg/dL — AB (ref 70–99)
Potassium: 5.1 mEq/L (ref 3.5–5.3)
SODIUM: 138 meq/L (ref 135–145)

## 2014-03-09 LAB — LIPID PANEL
Cholesterol: 157 mg/dL (ref 0–200)
HDL: 60 mg/dL (ref >39)
LDL Cholesterol: 78 mg/dL (ref 0–99)
Total CHOL/HDL Ratio: 2.6 Ratio
Triglycerides: 97 mg/dL (ref <150)
VLDL: 19 mg/dL (ref 0–40)

## 2014-03-09 LAB — TSH: TSH: 0.928 u[IU]/mL (ref 0.350–4.500)

## 2014-03-09 LAB — VITAMIN D 25 HYDROXY (VIT D DEFICIENCY, FRACTURES): VIT D 25 HYDROXY: 68 ng/mL (ref 30–89)

## 2014-03-09 LAB — HEMOGLOBIN A1C
HEMOGLOBIN A1C: 6 % — AB (ref <5.7)
Mean Plasma Glucose: 126 mg/dL — ABNORMAL HIGH (ref <117)

## 2014-03-09 LAB — MAGNESIUM: Magnesium: 1.8 mg/dL (ref 1.5–2.5)

## 2014-03-09 LAB — INSULIN, FASTING: Insulin fasting, serum: 68.6 u[IU]/mL — ABNORMAL HIGH (ref 2.0–19.6)

## 2014-03-10 ENCOUNTER — Other Ambulatory Visit: Payer: Self-pay | Admitting: Internal Medicine

## 2014-04-03 ENCOUNTER — Other Ambulatory Visit: Payer: Self-pay | Admitting: Internal Medicine

## 2014-04-07 ENCOUNTER — Other Ambulatory Visit: Payer: Self-pay

## 2014-04-13 ENCOUNTER — Ambulatory Visit (INDEPENDENT_AMBULATORY_CARE_PROVIDER_SITE_OTHER): Payer: Federal, State, Local not specified - PPO | Admitting: *Deleted

## 2014-04-13 DIAGNOSIS — Z23 Encounter for immunization: Secondary | ICD-10-CM

## 2014-04-16 ENCOUNTER — Other Ambulatory Visit: Payer: Self-pay | Admitting: Gastroenterology

## 2014-05-11 ENCOUNTER — Other Ambulatory Visit: Payer: Self-pay | Admitting: Gastroenterology

## 2014-05-16 ENCOUNTER — Other Ambulatory Visit: Payer: Self-pay | Admitting: Gastroenterology

## 2014-05-22 ENCOUNTER — Other Ambulatory Visit: Payer: Self-pay | Admitting: *Deleted

## 2014-05-22 MED ORDER — OMEPRAZOLE 20 MG PO CPDR: 20.0000 mg | DELAYED_RELEASE_CAPSULE | Freq: Every day | ORAL | Status: DC

## 2014-05-31 ENCOUNTER — Other Ambulatory Visit: Payer: Self-pay | Admitting: *Deleted

## 2014-05-31 MED ORDER — SOLIFENACIN SUCCINATE 10 MG PO TABS: 10.0000 mg | ORAL_TABLET | Freq: Every day | ORAL | Status: DC

## 2014-06-08 ENCOUNTER — Encounter: Payer: Self-pay | Admitting: Physician Assistant

## 2014-06-08 ENCOUNTER — Ambulatory Visit (INDEPENDENT_AMBULATORY_CARE_PROVIDER_SITE_OTHER): Payer: Federal, State, Local not specified - PPO | Admitting: Physician Assistant

## 2014-06-08 VITALS — BP 138/72 | HR 84 | Temp 98.1°F | Resp 16 | Ht 68.0 in | Wt 180.0 lb

## 2014-06-08 DIAGNOSIS — I10 Essential (primary) hypertension: Secondary | ICD-10-CM

## 2014-06-08 DIAGNOSIS — Z23 Encounter for immunization: Secondary | ICD-10-CM

## 2014-06-08 DIAGNOSIS — R7303 Prediabetes: Secondary | ICD-10-CM

## 2014-06-08 DIAGNOSIS — E559 Vitamin D deficiency, unspecified: Secondary | ICD-10-CM

## 2014-06-08 DIAGNOSIS — E782 Mixed hyperlipidemia: Secondary | ICD-10-CM

## 2014-06-08 DIAGNOSIS — R7309 Other abnormal glucose: Secondary | ICD-10-CM

## 2014-06-08 DIAGNOSIS — Z79899 Other long term (current) drug therapy: Secondary | ICD-10-CM

## 2014-06-08 LAB — HEMOGLOBIN A1C
HEMOGLOBIN A1C: 5.7 % — AB (ref <5.7)
Mean Plasma Glucose: 117 mg/dL — ABNORMAL HIGH (ref <117)

## 2014-06-08 LAB — HEPATIC FUNCTION PANEL
ALBUMIN: 4.2 g/dL (ref 3.5–5.2)
ALT: 25 U/L (ref 0–53)
AST: 26 U/L (ref 0–37)
Alkaline Phosphatase: 94 U/L (ref 39–117)
BILIRUBIN DIRECT: 0.1 mg/dL (ref 0.0–0.3)
BILIRUBIN TOTAL: 0.5 mg/dL (ref 0.2–1.2)
Indirect Bilirubin: 0.4 mg/dL (ref 0.2–1.2)
Total Protein: 7 g/dL (ref 6.0–8.3)

## 2014-06-08 LAB — BASIC METABOLIC PANEL WITH GFR
BUN: 19 mg/dL (ref 6–23)
CALCIUM: 9.8 mg/dL (ref 8.4–10.5)
CO2: 26 meq/L (ref 19–32)
CREATININE: 1.4 mg/dL — AB (ref 0.50–1.35)
Chloride: 102 mEq/L (ref 96–112)
GFR, EST AFRICAN AMERICAN: 61 mL/min
GFR, Est Non African American: 52 mL/min — ABNORMAL LOW
Glucose, Bld: 93 mg/dL (ref 70–99)
Potassium: 4.4 mEq/L (ref 3.5–5.3)
SODIUM: 139 meq/L (ref 135–145)

## 2014-06-08 LAB — LIPID PANEL
CHOLESTEROL: 142 mg/dL (ref 0–200)
HDL: 56 mg/dL (ref >39)
LDL Cholesterol: 72 mg/dL (ref 0–99)
TRIGLYCERIDES: 70 mg/dL (ref <150)
Total CHOL/HDL Ratio: 2.5 Ratio
VLDL: 14 mg/dL (ref 0–40)

## 2014-06-08 LAB — CBC WITH DIFFERENTIAL/PLATELET
BASOS PCT: 1 % (ref 0–1)
Basophils Absolute: 0.1 10*3/uL (ref 0.0–0.1)
EOS ABS: 0.2 10*3/uL (ref 0.0–0.7)
EOS PCT: 3 % (ref 0–5)
HCT: 40.2 % (ref 39.0–52.0)
Hemoglobin: 14 g/dL (ref 13.0–17.0)
LYMPHS ABS: 1.4 10*3/uL (ref 0.7–4.0)
LYMPHS PCT: 27 % (ref 12–46)
MCH: 30.4 pg (ref 26.0–34.0)
MCHC: 34.6 g/dL (ref 30.0–36.0)
MCV: 87.2 fL (ref 78.0–100.0)
MPV: 10.7 fL (ref 9.4–12.4)
Monocytes Absolute: 0.7 10*3/uL (ref 0.1–1.0)
Monocytes Relative: 13 % — ABNORMAL HIGH (ref 3–12)
NEUTROS ABS: 2.9 10*3/uL (ref 1.7–7.7)
Neutrophils Relative %: 56 % (ref 43–77)
Platelets: 273 10*3/uL (ref 150–400)
RBC: 4.61 MIL/uL (ref 4.22–5.81)
RDW: 13.5 % (ref 11.5–15.5)
WBC: 5.1 10*3/uL (ref 4.0–10.5)

## 2014-06-08 LAB — MAGNESIUM: MAGNESIUM: 2.1 mg/dL (ref 1.5–2.5)

## 2014-06-08 LAB — TSH: TSH: 0.962 u[IU]/mL (ref 0.350–4.500)

## 2014-06-08 NOTE — Progress Notes (Signed)
Assessment and Plan:  Hypertension: Continue medication, monitor blood pressure at home. Continue DASH diet.  Reminder to go to the ER if any CP, SOB, nausea, dizziness, severe HA, changes vision/speech, left arm numbness and tingling, and jaw pain. Cholesterol: Continue diet and exercise. Check cholesterol.  Pre-diabetes-Continue diet and exercise. Check A1C Vitamin D Def- check level and continue medications.  Cerumen impaction- stop using Qtips, use OTC drops/oil, if not better come to the office for irrigation Prevnar 13 this visit, will call about shingles and come get it before end of the year.  Will need Welcome to Medicare visit in [place] and meds as discussed. Further disposition pending results of labs.  HPI 63 y.o. male  presents for 3 month follow up with hypertension, hyperlipidemia, prediabetes and vitamin D. Will be switching to medicare A/B in Jan.  His blood pressure has been controlled at home, today their BP is BP: 138/72 mmHg He does workout. He denies chest pain, shortness of breath, dizziness.  He is on cholesterol medication, pravachol 40mg  and denies myalgias. His cholesterol is at goal. The cholesterol last visit was:   Lab Results  Component Value Date   CHOL 157 03/08/2014   HDL 60 03/08/2014   LDLCALC 78 03/08/2014   TRIG 97 03/08/2014   CHOLHDL 2.6 03/08/2014   He has been working on diet and exercise for prediabetes, and denies paresthesia of the feet, polydipsia, polyuria and visual disturbances. Last A1C in the office was:  Lab Results  Component Value Date   HGBA1C 6.0* 03/08/2014   Patient is on Vitamin D supplement.   Lab Results  Component Value Date   VD25OH 68 03/08/2014     Vesicare helps with OAB, no AE;s BMI is Body mass index is 27.38 kg/(m^2)., he is working on diet and exercise and has lost 5 lbs. . Wt Readings from Last 3 Encounters:  06/08/14 180 lb (81.647 kg)  03/08/14 185 lb 3.2 oz (84.006 kg)  12/05/13 185 lb  12.8 oz (84.278 kg)   Current Medications:  Current Outpatient Prescriptions on File Prior to Visit  Medication Sig Dispense Refill  . buPROPion (WELLBUTRIN XL) 150 MG 24 hr tablet TAKE 1 TABLET EVERY DAY 90 tablet 4  . Cholecalciferol (VITAMIN D-3) 1000 UNITS CAPS Take 2,000 Units by mouth daily.    . Multiple Vitamin (MULTIVITAMIN) tablet Take 1 tablet by mouth daily.    Marland Kitchen omeprazole (PRILOSEC) 20 MG capsule Take 1 capsule (20 mg total) by mouth daily. 30 capsule 3  . pravastatin (PRAVACHOL) 40 MG tablet TAKE 1 TABLET BY MOUTH AT BEDTIME FOR CHOLESTROL 90 tablet 3  . solifenacin (VESICARE) 10 MG tablet Take 1 tablet (10 mg total) by mouth daily. 30 tablet 2   No current facility-administered medications on file prior to visit.   Medical History:  Past Medical History  Diagnosis Date  . COPD (chronic obstructive pulmonary disease)   . GERD (gastroesophageal reflux disease)   . HLD (hyperlipidemia)   . IBS (irritable bowel syndrome)   . Pneumonia   . Hypertension   . Pre-diabetes   . Vitamin D deficiency   . Hematuria   . Hx of CABG    Allergies: No Known Allergies   Review of Systems:  Review of Systems  Constitutional: Negative.   HENT: Negative.   Eyes: Negative.   Respiratory: Negative.   Cardiovascular: Negative.   Gastrointestinal: Negative.   Genitourinary: Negative.   Musculoskeletal: Negative.   Skin: Negative.  Neurological: Negative.   Endo/Heme/Allergies: Negative.   Psychiatric/Behavioral: Negative.      Family history- Review and unchanged Social history- Review and unchanged Physical Exam: BP 138/72 mmHg  Pulse 84  Temp(Src) 98.1 F (36.7 C)  Resp 16  Ht 5\' 8"  (1.727 m)  Wt 180 lb (81.647 kg)  BMI 27.38 kg/m2 Wt Readings from Last 3 Encounters:  06/08/14 180 lb (81.647 kg)  03/08/14 185 lb 3.2 oz (84.006 kg)  12/05/13 185 lb 12.8 oz (84.278 kg)   General Appearance: Well nourished, in no apparent distress. Eyes: PERRLA, EOMs, conjunctiva  no swelling or erythema Sinuses: No Frontal/maxillary tenderness ENT/Mouth: Ext aud canals with cerumen impaction, left ear manually cleared, right ear partial, TMs without erythema, bulging. No erythema, swelling, or exudate on post pharynx.  Tonsils not swollen or erythematous. Hearing normal.  Neck: Supple, thyroid normal.  Respiratory: Respiratory effort normal, BS equal bilaterally without rales, rhonchi, wheezing or stridor.  Cardio: RRR with no MRGs. Brisk peripheral pulses without edema.  Abdomen: Soft, + BS, obese  Non tender, no guarding, rebound, hernias, masses. Lymphatics: Non tender without lymphadenopathy.  Musculoskeletal: Full ROM, 5/5 strength, normal gait.  Skin: Warm, dry without rashes, lesions, ecchymosis.  Neuro: Cranial nerves intact. Normal muscle tone, no cerebellar symptoms. Sensation intact.  Psych: Awake and oriented X 3, normal affect, Insight and Judgment appropriate.    Vicie Mutters, PA-C 10:01 AM [facility]

## 2014-06-08 NOTE — Patient Instructions (Signed)
Use a dropper to put olive oil or canola oil in the effected ear- 2-3 times a week. Let it soak for 20-30 min then you can take a shower or use a baby bulb with warm water to wash out the ear wax.  Do not use Qtips     Bad carbs also include fruit juice, alcohol, and sweet tea. These are empty calories that do not signal to your brain that you are full.   Please remember the good carbs are still carbs which convert into sugar. So please measure them out no more than 1/2-1 cup of rice, oatmeal, pasta, and beans  Veggies are however free foods! Pile them on.   Not all fruit is created equal. Please see the list below, the fruit at the bottom is higher in sugars than the fruit at the top. Please avoid all dried fruits.

## 2014-06-09 LAB — VITAMIN D 25 HYDROXY (VIT D DEFICIENCY, FRACTURES): Vit D, 25-Hydroxy: 67 ng/mL (ref 30–100)

## 2014-06-12 ENCOUNTER — Ambulatory Visit (INDEPENDENT_AMBULATORY_CARE_PROVIDER_SITE_OTHER): Payer: Federal, State, Local not specified - PPO | Admitting: *Deleted

## 2014-06-12 DIAGNOSIS — Z23 Encounter for immunization: Secondary | ICD-10-CM

## 2014-06-28 ENCOUNTER — Other Ambulatory Visit: Payer: Self-pay | Admitting: *Deleted

## 2014-06-28 MED ORDER — BUPROPION HCL ER (XL) 150 MG PO TB24: 150.0000 mg | ORAL_TABLET | Freq: Every day | ORAL | Status: DC

## 2014-06-28 MED ORDER — OMEPRAZOLE 20 MG PO CPDR: 20.0000 mg | DELAYED_RELEASE_CAPSULE | Freq: Every day | ORAL | Status: DC

## 2014-07-10 ENCOUNTER — Telehealth: Payer: Self-pay | Admitting: *Deleted

## 2014-07-10 ENCOUNTER — Other Ambulatory Visit: Payer: Self-pay | Admitting: *Deleted

## 2014-07-10 MED ORDER — ATORVASTATIN CALCIUM 80 MG PO TABS: 80.0000 mg | ORAL_TABLET | Freq: Every day | ORAL | Status: DC

## 2014-07-10 MED ORDER — PRAVASTATIN SODIUM 80 MG PO TABS: 80.0000 mg | ORAL_TABLET | Freq: Every day | ORAL | Status: DC

## 2014-07-10 NOTE — Telephone Encounter (Signed)
Spoke with patient regarding new RX for Atorvastatin 80 mg.  Patient advised to take 1/2 tab 3 days a week on Tu,TH,SAT until the next office visit per Dr Melford Aase.

## 2014-08-04 ENCOUNTER — Other Ambulatory Visit: Payer: Self-pay | Admitting: *Deleted

## 2014-08-04 MED ORDER — OMEPRAZOLE 20 MG PO CPDR: 20.0000 mg | DELAYED_RELEASE_CAPSULE | Freq: Every day | ORAL | Status: DC

## 2014-08-04 MED ORDER — SOLIFENACIN SUCCINATE 10 MG PO TABS: 10.0000 mg | ORAL_TABLET | Freq: Every day | ORAL | Status: DC

## 2014-08-04 MED ORDER — BUPROPION HCL ER (XL) 150 MG PO TB24: 150.0000 mg | ORAL_TABLET | Freq: Every day | ORAL | Status: DC

## 2014-09-05 ENCOUNTER — Ambulatory Visit: Payer: Self-pay | Admitting: Physician Assistant

## 2014-09-07 ENCOUNTER — Ambulatory Visit (INDEPENDENT_AMBULATORY_CARE_PROVIDER_SITE_OTHER): Payer: Medicare Other | Admitting: Physician Assistant

## 2014-09-07 ENCOUNTER — Encounter: Payer: Self-pay | Admitting: Physician Assistant

## 2014-09-07 VITALS — BP 122/78 | HR 88 | Temp 97.7°F | Resp 16 | Ht 68.0 in | Wt 182.0 lb

## 2014-09-07 DIAGNOSIS — R7309 Other abnormal glucose: Secondary | ICD-10-CM

## 2014-09-07 DIAGNOSIS — I10 Essential (primary) hypertension: Secondary | ICD-10-CM | POA: Diagnosis not present

## 2014-09-07 DIAGNOSIS — J449 Chronic obstructive pulmonary disease, unspecified: Secondary | ICD-10-CM

## 2014-09-07 DIAGNOSIS — R6889 Other general symptoms and signs: Secondary | ICD-10-CM | POA: Diagnosis not present

## 2014-09-07 DIAGNOSIS — R7303 Prediabetes: Secondary | ICD-10-CM

## 2014-09-07 DIAGNOSIS — Z0001 Encounter for general adult medical examination with abnormal findings: Secondary | ICD-10-CM

## 2014-09-07 DIAGNOSIS — K2100 Gastro-esophageal reflux disease with esophagitis, without bleeding: Secondary | ICD-10-CM

## 2014-09-07 DIAGNOSIS — Z79899 Other long term (current) drug therapy: Secondary | ICD-10-CM | POA: Diagnosis not present

## 2014-09-07 DIAGNOSIS — E559 Vitamin D deficiency, unspecified: Secondary | ICD-10-CM

## 2014-09-07 DIAGNOSIS — Z1331 Encounter for screening for depression: Secondary | ICD-10-CM

## 2014-09-07 DIAGNOSIS — Z23 Encounter for immunization: Secondary | ICD-10-CM

## 2014-09-07 DIAGNOSIS — Z789 Other specified health status: Secondary | ICD-10-CM

## 2014-09-07 DIAGNOSIS — E785 Hyperlipidemia, unspecified: Secondary | ICD-10-CM

## 2014-09-07 DIAGNOSIS — K5732 Diverticulitis of large intestine without perforation or abscess without bleeding: Secondary | ICD-10-CM

## 2014-09-07 DIAGNOSIS — F411 Generalized anxiety disorder: Secondary | ICD-10-CM

## 2014-09-07 DIAGNOSIS — J4489 Other specified chronic obstructive pulmonary disease: Secondary | ICD-10-CM

## 2014-09-07 DIAGNOSIS — N3281 Overactive bladder: Secondary | ICD-10-CM

## 2014-09-07 LAB — CBC WITH DIFFERENTIAL/PLATELET
BASOS ABS: 0 10*3/uL (ref 0.0–0.1)
Basophils Relative: 0 % (ref 0–1)
EOS PCT: 2 % (ref 0–5)
Eosinophils Absolute: 0.1 10*3/uL (ref 0.0–0.7)
HCT: 40.8 % (ref 39.0–52.0)
HEMOGLOBIN: 13.7 g/dL (ref 13.0–17.0)
LYMPHS PCT: 29 % (ref 12–46)
Lymphs Abs: 1.5 10*3/uL (ref 0.7–4.0)
MCH: 30 pg (ref 26.0–34.0)
MCHC: 33.6 g/dL (ref 30.0–36.0)
MCV: 89.3 fL (ref 78.0–100.0)
MONO ABS: 0.6 10*3/uL (ref 0.1–1.0)
MONOS PCT: 11 % (ref 3–12)
MPV: 10 fL (ref 8.6–12.4)
NEUTROS PCT: 58 % (ref 43–77)
Neutro Abs: 3.1 10*3/uL (ref 1.7–7.7)
Platelets: 291 10*3/uL (ref 150–400)
RBC: 4.57 MIL/uL (ref 4.22–5.81)
RDW: 13.6 % (ref 11.5–15.5)
WBC: 5.3 10*3/uL (ref 4.0–10.5)

## 2014-09-07 LAB — HEMOGLOBIN A1C
Hgb A1c MFr Bld: 5.8 % — ABNORMAL HIGH (ref <5.7)
MEAN PLASMA GLUCOSE: 120 mg/dL — AB (ref <117)

## 2014-09-07 MED ORDER — ATORVASTATIN CALCIUM 40 MG PO TABS: 40.0000 mg | ORAL_TABLET | Freq: Every day | ORAL | Status: DC

## 2014-09-07 NOTE — Progress Notes (Signed)
WELCOME TO MEDICARE VISIT AND FOLLOW UP  Assessment:   1. COPD (chronic obstructive pulmonary disease) with chronic bronchitis Not smoking x 11 years, no exacerbations, continue exercise.   2. Generalized anxiety disorder Anxiety-  continue medications, stress management techniques discussed, increase water, good sleep hygiene discussed, increase exercise, and increase veggies.   3. Essential hypertension - continue medications, DASH diet, exercise and monitor at home. Call if greater than 130/80.  - CBC with Differential/Platelet - BASIC METABOLIC PANEL WITH GFR - Hepatic function panel - TSH  4. Prediabetes Discussed general issues about diabetes pathophysiology and management., Educational material distributed., Suggested low cholesterol diet., Encouraged aerobic exercise., Discussed foot care., Reminded to get yearly retinal exam. - Hemoglobin A1c - Insulin, fasting - HM DIABETES FOOT EXAM  5. Hyperlipidemia -continue medications, check lipids, decrease fatty foods, increase activity. - Lipid panel  6. Medication management - Magnesium  7. Vitamin D deficiency - Vit D  25 hydroxy (rtn osteoporosis monitoring)  8. OAB (overactive bladder) Continue vesicare  9. Diverticulitis of colon without hemorrhage monitor  10. Reflux esophagitis GERD- will try to get off PPiI given info for taper and zantac sent in  11. Need for prophylactic vaccination against Streptococcus pneumoniae (pneumococcus) - Pneumococcal polysaccharide vaccine 23-valent greater than or equal to 2yo subcutaneous/IM   Plan:   During the course of the visit the patient was educated and counseled about appropriate screening and preventive services including:    Pneumococcal vaccine   Influenza vaccine  Td vaccine  Screening electrocardiogram  Screening mammography  Bone densitometry screening  Colorectal cancer screening  Diabetes screening  Glaucoma screening  Nutrition counseling    Advanced directives: given info/requested  Conditions/risks identified: Diabetes is at goal, ACE/ARB therapy: No, Reason not on Ace Inhibitor/ARB therapy:  only preDM Urinary Incontinence is not an issue: discussed non pharmacology and pharmacology options.  Fall risk: low- discussed PT, home fall assessment, medications.    Subjective:   [name] Park is a 64 y.o. male who presents for Welcome to Medicare  Visit and 6 month follow up on hypertension, prediabetes, hyperlipidemia, vitamin D def.    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 He denies chest pain, shortness of breath, dizziness.  He is on cholesterol medication, lipitor 80mg  1/2 pill every other day and denies myalgias. His cholesterol is at goal. The cholesterol last visit was:   Lab Results  Component Value Date   CHOL 142 06/08/2014   HDL 56 06/08/2014   LDLCALC 72 06/08/2014   TRIG 70 06/08/2014   CHOLHDL 2.5 06/08/2014  He has OAB and is on vesicare which helps.  He is on wellbutrin which helps with his anxiety.  He use to smoke, quit over 11 years ago, does have diagnosis of COPD via CXR in 2013 but denies any SOB and has not had any exacerbations.  He has GERD and is on prilosec daily and wishes to get off of it.  He has been working on diet and exercise for prediabetes, and denies paresthesia of the feet, polydipsia, polyuria and visual disturbances. Last A1C in the office was:  Lab Results  Component Value Date   HGBA1C 5.7* 06/08/2014  Patient is on Vitamin D supplement. Lab Results  Component Value Date   VD25OH 67 06/08/2014   BMI is Body mass index is 27.68 kg/(m^2)., he is working  on diet and exercise. Wt Readings from Last 3 Encounters:  09/07/14 182 lb (82.555 kg)  06/08/14 180 lb (81.647 kg)  03/08/14 185 lb 3.2 oz (84.006 kg)    Medication Review Current Outpatient Prescriptions on  File Prior to Visit  Medication Sig Dispense Refill  . atorvastatin (LIPITOR) 80 MG tablet Take 1 tablet (80 mg total) by mouth daily. 30 tablet 5  . buPROPion (WELLBUTRIN XL) 150 MG 24 hr tablet Take 1 tablet (150 mg total) by mouth daily. 90 tablet 3  . Cholecalciferol (VITAMIN D-3) 1000 UNITS CAPS Take 2,000 Units by mouth daily.    . Multiple Vitamin (MULTIVITAMIN) tablet Take 1 tablet by mouth daily.    Marland Kitchen omeprazole (PRILOSEC) 20 MG capsule Take 1 capsule (20 mg total) by mouth daily. 90 capsule 3  . solifenacin (VESICARE) 10 MG tablet Take 1 tablet (10 mg total) by mouth daily. 90 tablet 3   No current facility-administered medications on file prior to visit.    Current Problems (verified) Patient Active Problem List   Diagnosis Date Noted  . Hyperlipidemia 06/08/2014  . Reflux esophagitis 03/08/2014  . OAB (overactive bladder) 03/08/2014  . Diverticulitis of colon without hemorrhage 03/08/2014  . Essential hypertension 12/05/2013  . Prediabetes 12/05/2013  . Medication management 12/05/2013  . Hyperlipidemia 06/21/2013  . Vitamin D deficiency 06/21/2013    Screening Tests Immunization History  Administered Date(s) Administered  . Influenza Whole 04/07/2013  . Influenza, High Dose Seasonal PF 04/13/2014  . PPD Test 12/05/2013  . Pneumococcal Conjugate-13 06/08/2014  . Tdap 04/29/2011  . Zoster 06/12/2014    Preventative care: Last colonoscopy: 2007 repeat 5 years  EGD: 01/2013 DEXA: N/A  Prior vaccinations: TD or Tdap: 2012  Influenza: 2015 Pneumococcal: DUE Prevnar13: 2015 Shingles/Zostavax: 2015  Names of Other Physician/Practitioners you currently use: 1. [facility]- here for primary care 2. Dr. Katy Apo, eye doctor, last visit 10/2013, yearly, + cataracts 3. Dr. Quillian Quince, dentist, last visit 6 months ago, has appt this month.  Patient Care Team: Unk Pinto, MD as PCP - General (Internal Medicine) Katy Apo, MD as Consulting Physician (Ophthalmology) Ladene Artist, MD as Consulting Physician (Gastroenterology) Eula Listen, DDS as Referring Physician (Dentistry) Druscilla Brownie, MD as Consulting Physician (Dermatology)  Past Surgical History  Procedure Laterality Date  . Cervical fusion    . Trigger finger release Right     thumb  . Carpal tunnel release Right   . Hand surgery Left    Family History  Problem Relation Age of Onset  . Irritable bowel syndrome Mother   . Diverticulosis Mother   . Lupus Mother   . COPD Mother   . Hypertension Mother   . Lung cancer Father   . Other Father     bladder polyps  . Stroke Father   . Kidney disease Father   . Cancer Father    History  Substance Use Topics  . Smoking status: Former Smoker -- 1.50 packs/day for 35 years    Types: Cigarettes    Quit date: 02/05/2004  . Smokeless tobacco: Never Used  . Alcohol Use: No    MEDICARE WELLNESS OBJECTIVES: Tobacco use: He does not smoke.  Patient is a former smoker. Alcohol Current alcohol use: none Caffeine Current caffeine use: denies use Osteoporosis: dietary calcium and/or vitamin D deficiency, History of fracture in the past year: no Diet: in general, a "healthy" diet   Physical activity: walking Depression/mood screen:  Yes -  No Depression Hearing: normal Visual acuity: normal,  does perform annual eye exam  ADLs: self care Fall risk: Low Risk Home safety: good Cognitive Testing  Alert? Yes  Normal Appearance?Yes  Oriented to person? Yes  Place? Yes   Time? Yes  Recall of three objects?  Yes  Can perform simple calculations? Yes  Displays appropriate judgment?Yes  Can read the correct time from a watch face?Yes EOL planning: Yes   Objective:   Blood pressure 122/78, pulse 88, temperature 97.7 F (36.5 C), resp. rate 16, height 5\' 8"  (1.727 m), weight 182 lb (82.555 kg). Body mass index is 27.68 kg/(m^2).  General appearance: alert, no distress, WD/WN,   male HEENT: normocephalic, sclerae anicteric, TMs pearly, nares patent, no discharge or erythema, pharynx normal Oral cavity: MMM, no lesions Neck: supple, no lymphadenopathy, no thyromegaly, no masses Heart: RRR, normal S1, S2, no murmurs Lungs: CTA bilaterally, no wheezes, rhonchi, or rales Abdomen: +bs, soft, non tender, non distended, no masses, no hepatomegaly, no splenomegaly Musculoskeletal: nontender, no swelling, no obvious deformity Extremities: no edema, no cyanosis, no clubbing Pulses: 2+ symmetric, upper and lower extremities, normal cap refill Neurological: alert, oriented x 3, CN2-12 intact, strength normal upper extremities and lower extremities, sensation normal throughout, DTRs 2+ throughout, no cerebellar signs, gait normal Psychiatric: normal affect, behavior normal, pleasant  Rectal: defer   Medicare Attestation I have personally reviewed: The patient's medical and social history Their use of alcohol, tobacco or illicit drugs Their current medications and supplements The patient's functional ability including ADLs,fall risks, home safety risks, cognitive, and hearing and visual impairment Diet and physical activities Evidence for depression or mood disorders  The patient's weight, height, BMI, and visual acuity have been recorded in the chart.  I have made referrals, counseling, and provided education to the patient based on review of the above and I have provided the patient with a written personalized care plan for preventive services.     Vicie Mutters, PA-C   09/07/2014

## 2014-09-07 NOTE — Patient Instructions (Signed)
Use a dropper or use a cap to put olive oil,mineral oil or canola oil in the effected ear- 2-3 times a week. Let it soak for 20-30 min then you can take a shower or use a baby bulb with warm water to wash out the ear wax.  Do not use Qtips  Preventive Care for Adults A healthy lifestyle and preventive care can promote health and wellness. Preventive health guidelines for men include the following key practices:  A routine yearly physical is a good way to check with your health care provider about your health and preventative screening. It is a chance to share any concerns and updates on your health and to receive a thorough exam.  Visit your dentist for a routine exam and preventative care every 6 months. Brush your teeth twice a day and floss once a day. Good oral hygiene prevents tooth decay and gum disease.  The frequency of eye exams is based on your age, health, family medical history, use of contact lenses, and other factors. Follow your health care provider's recommendations for frequency of eye exams.  Eat a healthy diet. Foods such as vegetables, fruits, whole grains, low-fat dairy products, and lean protein foods contain the nutrients you need without too many calories. Decrease your intake of foods high in solid fats, added sugars, and salt. Eat the right amount of calories for you.Get information about a proper diet from your health care provider, if necessary.  Regular physical exercise is one of the most important things you can do for your health. Most adults should get at least 150 minutes of moderate-intensity exercise (any activity that increases your heart rate and causes you to sweat) each week. In addition, most adults need muscle-strengthening exercises on 2 or more days a week.  Maintain a healthy weight. The body mass index (BMI) is a screening tool to identify possible weight problems. It provides an estimate of body fat based on height and weight. Your health care provider  can find your BMI and can help you achieve or maintain a healthy weight.For adults 20 years and older:  A BMI below 18.5 is considered underweight.  A BMI of 18.5 to 24.9 is normal.  A BMI of 25 to 29.9 is considered overweight.  A BMI of 30 and above is considered obese.  Maintain normal blood lipids and cholesterol levels by exercising and minimizing your intake of saturated fat. Eat a balanced diet with plenty of fruit and vegetables. Blood tests for lipids and cholesterol should begin at age 50 and be repeated every 5 years. If your lipid or cholesterol levels are high, you are over 50, or you are at high risk for heart disease, you may need your cholesterol levels checked more frequently.Ongoing high lipid and cholesterol levels should be treated with medicines if diet and exercise are not working.  If you smoke, find out from your health care provider how to quit. If you do not use tobacco, do not start.  Lung cancer screening is recommended for adults aged 75-80 years who are at high risk for developing lung cancer because of a history of smoking. A yearly low-dose CT scan of the lungs is recommended for people who have at least a 30-pack-year history of smoking and are a current smoker or have quit within the past 15 years. A pack year of smoking is smoking an average of 1 pack of cigarettes a day for 1 year (for example: 1 pack a day for 30 years or  2 packs a day for 15 years). Yearly screening should continue until the smoker has stopped smoking for at least 15 years. Yearly screening should be stopped for people who develop a health problem that would prevent them from having lung cancer treatment.  If you choose to drink alcohol, do not have more than 2 drinks per day. One drink is considered to be 12 ounces (355 mL) of beer, 5 ounces (148 mL) of wine, or 1.5 ounces (44 mL) of liquor.  Avoid use of street drugs. Do not share needles with anyone. Ask for help if you need support or  instructions about stopping the use of drugs.  High blood pressure causes heart disease and increases the risk of stroke. Your blood pressure should be checked at least every 1-2 years. Ongoing high blood pressure should be treated with medicines, if weight loss and exercise are not effective.  If you are 51-9 years old, ask your health care provider if you should take aspirin to prevent heart disease.  Diabetes screening involves taking a blood sample to check your fasting blood sugar level. Testing should be considered at a younger age or be carried out more frequently if you are overweight and have at least 1 risk factor for diabetes.  Colorectal cancer can be detected and often prevented. Most routine colorectal cancer screening begins at the age of 3 and continues through age 60. However, your health care provider may recommend screening at an earlier age if you have risk factors for colon cancer. On a yearly basis, your health care provider may provide home test kits to check for hidden blood in the stool. Use of a small camera at the end of a tube to directly examine the colon (sigmoidoscopy or colonoscopy) can detect the earliest forms of colorectal cancer. Talk to your health care provider about this at age 45, when routine screening begins. Direct exam of the colon should be repeated every 5-10 years through age 40, unless early forms of precancerous polyps or small growths are found.  Hepatitis C blood testing is recommended for all people born from [birth year] through 1965 and any individual with known risks for hepatitis C.  New guidelines recommend a once time screening for HIV.   Screening for abdominal aortic aneurysm (AAA)  by ultrasound is recommended for people who have history of high blood pressure or who are current or former smokers.  Healthy men should  receive prostate-specific antigen (PSA) blood tests as part of routine cancer screening. Talk with your health care provider about  prostate cancer screening.  Testicular cancer screening is  recommended for adult males. Screening includes self-exam, a health care provider exam, and other screening tests. Consult with your health care provider about any symptoms you have or any concerns you have about testicular cancer.  Use sunscreen. Apply sunscreen liberally and repeatedly throughout the day. You should seek shade when your shadow is shorter than you. Protect yourself by wearing long sleeves, pants, a wide-brimmed hat, and sunglasses year round, whenever you are outdoors.  Once a month, do a whole-body skin exam, using a mirror to look at the skin on your back. Tell your health care provider about new moles, moles that have irregular borders, moles that are larger than a pencil eraser, or moles that have changed in shape or color.  Stay current with required vaccines (immunizations).  Influenza vaccine. All adults should be immunized every year.  Tetanus, diphtheria, and acellular pertussis (Td, Tdap) vaccine. An adult who  has not previously received Tdap or who does not know his vaccine status should receive 1 dose of Tdap. This initial dose should be followed by tetanus and diphtheria toxoids (Td) booster doses every 10 years. Adults with an unknown or incomplete history of completing a 3-dose immunization series with Td-containing vaccines should begin or complete a primary immunization series including a Tdap dose. Adults should receive a Td booster every 10 years.  Zoster vaccine. One dose is recommended for adults aged 24 years or older unless certain conditions are present.    PREVNAR - Pneumococcal 13-valent conjugate (PCV13) vaccine. When indicated, a person who is uncertain of his immunization history and has no record of immunization should receive the PCV13 vaccine. An adult aged 20 years or older who has certain medical conditions and has not been previously immunized should receive 1 dose of PCV13 vaccine. This  PCV13 should be followed with a dose of pneumococcal polysaccharide (PPSV23) vaccine. The PPSV23 vaccine dose should be obtained at least 8 weeks after the dose of PCV13 vaccine. An adult aged 67 years or older who has certain medical conditions and previously received 1 or more doses of PPSV23 vaccine should receive 1 dose of PCV13. The PCV13 vaccine dose should be obtained 1 or more years after the last PPSV23 vaccine dose.    PNEUMOVAX - Pneumococcal polysaccharide (PPSV23) vaccine. When PCV13 is also indicated, PCV13 should be obtained first. All adults aged 11 years and older should be immunized. An adult younger than age 73 years who has certain medical conditions should be immunized. Any person who resides in a nursing home or long-term care facility should be immunized. An adult smoker should be immunized. People with an immunocompromised condition and certain other conditions should receive both PCV13 and PPSV23 vaccines. People with human immunodeficiency virus (HIV) infection should be immunized as soon as possible after diagnosis. Immunization during chemotherapy or radiation therapy should be avoided. Routine use of PPSV23 vaccine is not recommended for American Indians, [place] Natives, or people younger than 65 years unless there are medical conditions that require PPSV23 vaccine. When indicated, people who have unknown immunization and have no record of immunization should receive PPSV23 vaccine. One-time revaccination 5 years after the first dose of PPSV23 is recommended for people aged 19-64 years who have chronic kidney failure, nephrotic syndrome, asplenia, or immunocompromised conditions. People who received 1-2 doses of PPSV23 before age 62 years should receive another dose of PPSV23 vaccine at age 67 years or later if at least 5 years have passed since the previous dose. Doses of PPSV23 are not needed for people immunized with PPSV23 at or after age 99 years.    Hepatitis A vaccine.  Adults who wish to be protected from this disease, have certain high-risk conditions, work with hepatitis A-infected animals, work in hepatitis A research labs, or travel to or work in countries with a high rate of hepatitis A should be immunized. Adults who were previously unvaccinated and who anticipate close contact with an international adoptee during the first 60 days after arrival in the [place] from a country with a high rate of hepatitis A should be immunized.    Hepatitis B vaccine. Adults should be immunized if they wish to be protected from this disease, have certain high-risk conditions, may be exposed to blood or other infectious body fluids, are household contacts or sex partners of hepatitis B positive people, are clients or workers in certain care facilities, or travel to or work in  countries with a high rate of hepatitis B.   Preventive Service / Frequency   Ages 53 and over  Blood pressure check.  Lipid and cholesterol check.  Lung cancer screening. / Every year if you are aged 40-80 years and have a 30-pack-year history of smoking and currently smoke or have quit within the past 15 years. Yearly screening is stopped once you have quit smoking for at least 15 years or develop a health problem that would prevent you from having lung cancer treatment.  Fecal occult blood test (FOBT) of stool. You may not have to do this test if you get a colonoscopy every 10 years.  Flexible sigmoidoscopy** or colonoscopy.** / Every 5 years for a flexible sigmoidoscopy or every 10 years for a colonoscopy beginning at age 21 and continuing until age 64.  Hepatitis C blood test.** / For all people born from [birth year] through 1967 and any individual with known risks for hepatitis C.  Abdominal aortic aneurysm (AAA) screening./ Screening current or former smokers or have Hypertension.  Skin self-exam. / Monthly.  Influenza vaccine. / Every year.  Tetanus, diphtheria, and acellular pertussis  (Tdap/Td) vaccine.** / 1 dose of Td every 10 years.   Zoster vaccine.** / 1 dose for adults aged 38 years or older.         Pneumococcal 13-valent conjugate (PCV13) vaccine.    Pneumococcal polysaccharide (PPSV23) vaccine.     Hepatitis A vaccine.** / Consult your health care provider.  Hepatitis B vaccine.** / Consult your health care provider. Screening for abdominal aortic aneurysm (AAA)  by ultrasound is recommended for people who have history of high blood pressure or who are current or former smokers.

## 2014-09-08 ENCOUNTER — Other Ambulatory Visit: Payer: Self-pay

## 2014-09-08 LAB — HEPATIC FUNCTION PANEL
ALT: 29 U/L (ref 0–53)
AST: 29 U/L (ref 0–37)
Albumin: 4.3 g/dL (ref 3.5–5.2)
Alkaline Phosphatase: 99 U/L (ref 39–117)
BILIRUBIN TOTAL: 0.5 mg/dL (ref 0.2–1.2)
Bilirubin, Direct: 0.1 mg/dL (ref 0.0–0.3)
Indirect Bilirubin: 0.4 mg/dL (ref 0.2–1.2)
TOTAL PROTEIN: 6.9 g/dL (ref 6.0–8.3)

## 2014-09-08 LAB — LIPID PANEL
CHOL/HDL RATIO: 2.2 ratio
CHOLESTEROL: 132 mg/dL (ref 0–200)
HDL: 59 mg/dL (ref >=40)
LDL CALC: 60 mg/dL (ref 0–99)
TRIGLYCERIDES: 65 mg/dL (ref <150)
VLDL: 13 mg/dL (ref 0–40)

## 2014-09-08 LAB — BASIC METABOLIC PANEL WITH GFR
BUN: 17 mg/dL (ref 6–23)
CO2: 30 mEq/L (ref 19–32)
Calcium: 9.9 mg/dL (ref 8.4–10.5)
Chloride: 104 mEq/L (ref 96–112)
Creat: 1.25 mg/dL (ref 0.50–1.35)
GFR, Est African American: 69 mL/min
GFR, Est Non African American: 60 mL/min
GLUCOSE: 87 mg/dL (ref 70–99)
Potassium: 5.2 mEq/L (ref 3.5–5.3)
SODIUM: 139 meq/L (ref 135–145)

## 2014-09-08 LAB — VITAMIN D 25 HYDROXY (VIT D DEFICIENCY, FRACTURES): Vit D, 25-Hydroxy: 56 ng/mL (ref 30–100)

## 2014-09-08 LAB — TSH: TSH: 0.682 u[IU]/mL (ref 0.350–4.500)

## 2014-09-08 LAB — MAGNESIUM: MAGNESIUM: 2.1 mg/dL (ref 1.5–2.5)

## 2014-09-08 LAB — INSULIN, FASTING: INSULIN FASTING, SERUM: 8.7 u[IU]/mL (ref 2.0–19.6)

## 2014-09-08 MED ORDER — RANITIDINE HCL 150 MG PO CAPS: 150.0000 mg | ORAL_CAPSULE | Freq: Two times a day (BID) | ORAL | Status: DC

## 2014-09-11 ENCOUNTER — Other Ambulatory Visit: Payer: Self-pay

## 2014-09-11 MED ORDER — ATORVASTATIN CALCIUM 40 MG PO TABS: 40.0000 mg | ORAL_TABLET | Freq: Every day | ORAL | Status: DC

## 2014-09-13 ENCOUNTER — Other Ambulatory Visit: Payer: Self-pay

## 2014-09-13 MED ORDER — ATORVASTATIN CALCIUM 40 MG PO TABS: 40.0000 mg | ORAL_TABLET | Freq: Every day | ORAL | Status: DC

## 2014-09-27 DIAGNOSIS — D1801 Hemangioma of skin and subcutaneous tissue: Secondary | ICD-10-CM | POA: Diagnosis not present

## 2014-09-27 DIAGNOSIS — L821 Other seborrheic keratosis: Secondary | ICD-10-CM | POA: Diagnosis not present

## 2014-09-27 DIAGNOSIS — L814 Other melanin hyperpigmentation: Secondary | ICD-10-CM | POA: Diagnosis not present

## 2014-09-27 DIAGNOSIS — D225 Melanocytic nevi of trunk: Secondary | ICD-10-CM | POA: Diagnosis not present

## 2014-11-09 ENCOUNTER — Other Ambulatory Visit: Payer: Self-pay | Admitting: Physician Assistant

## 2014-11-15 DIAGNOSIS — H2513 Age-related nuclear cataract, bilateral: Secondary | ICD-10-CM | POA: Diagnosis not present

## 2014-11-15 DIAGNOSIS — H33302 Unspecified retinal break, left eye: Secondary | ICD-10-CM | POA: Diagnosis not present

## 2014-11-15 DIAGNOSIS — H25011 Cortical age-related cataract, right eye: Secondary | ICD-10-CM | POA: Diagnosis not present

## 2014-12-05 DIAGNOSIS — H25011 Cortical age-related cataract, right eye: Secondary | ICD-10-CM | POA: Diagnosis not present

## 2014-12-05 DIAGNOSIS — H25811 Combined forms of age-related cataract, right eye: Secondary | ICD-10-CM | POA: Diagnosis not present

## 2014-12-05 DIAGNOSIS — H2511 Age-related nuclear cataract, right eye: Secondary | ICD-10-CM | POA: Diagnosis not present

## 2014-12-13 ENCOUNTER — Encounter: Payer: Self-pay | Admitting: Internal Medicine

## 2014-12-28 ENCOUNTER — Encounter: Payer: Self-pay | Admitting: Internal Medicine

## 2015-01-02 ENCOUNTER — Ambulatory Visit (INDEPENDENT_AMBULATORY_CARE_PROVIDER_SITE_OTHER): Payer: Medicare Other | Admitting: Internal Medicine

## 2015-01-02 ENCOUNTER — Encounter: Payer: Self-pay | Admitting: Internal Medicine

## 2015-01-02 VITALS — BP 130/76 | HR 88 | Temp 97.7°F | Resp 16 | Ht 68.0 in | Wt 184.0 lb

## 2015-01-02 DIAGNOSIS — D519 Vitamin B12 deficiency anemia, unspecified: Secondary | ICD-10-CM

## 2015-01-02 DIAGNOSIS — K219 Gastro-esophageal reflux disease without esophagitis: Secondary | ICD-10-CM

## 2015-01-02 DIAGNOSIS — Z9181 History of falling: Secondary | ICD-10-CM

## 2015-01-02 DIAGNOSIS — J4489 Other specified chronic obstructive pulmonary disease: Secondary | ICD-10-CM

## 2015-01-02 DIAGNOSIS — J449 Chronic obstructive pulmonary disease, unspecified: Secondary | ICD-10-CM

## 2015-01-02 DIAGNOSIS — E785 Hyperlipidemia, unspecified: Secondary | ICD-10-CM

## 2015-01-02 DIAGNOSIS — E559 Vitamin D deficiency, unspecified: Secondary | ICD-10-CM

## 2015-01-02 DIAGNOSIS — Z79899 Other long term (current) drug therapy: Secondary | ICD-10-CM | POA: Diagnosis not present

## 2015-01-02 DIAGNOSIS — Z6827 Body mass index (BMI) 27.0-27.9, adult: Secondary | ICD-10-CM

## 2015-01-02 DIAGNOSIS — D509 Iron deficiency anemia, unspecified: Secondary | ICD-10-CM

## 2015-01-02 DIAGNOSIS — Z1212 Encounter for screening for malignant neoplasm of rectum: Secondary | ICD-10-CM

## 2015-01-02 DIAGNOSIS — I10 Essential (primary) hypertension: Secondary | ICD-10-CM

## 2015-01-02 DIAGNOSIS — R7303 Prediabetes: Secondary | ICD-10-CM

## 2015-01-02 DIAGNOSIS — Z0000 Encounter for general adult medical examination without abnormal findings: Secondary | ICD-10-CM

## 2015-01-02 DIAGNOSIS — Z125 Encounter for screening for malignant neoplasm of prostate: Secondary | ICD-10-CM

## 2015-01-02 DIAGNOSIS — Z1331 Encounter for screening for depression: Secondary | ICD-10-CM

## 2015-01-02 LAB — BASIC METABOLIC PANEL WITH GFR
BUN: 18 mg/dL (ref 6–23)
CO2: 28 mEq/L (ref 19–32)
CREATININE: 1.3 mg/dL (ref 0.50–1.35)
Calcium: 9.7 mg/dL (ref 8.4–10.5)
Chloride: 97 mEq/L (ref 96–112)
GFR, EST AFRICAN AMERICAN: 66 mL/min
GFR, EST NON AFRICAN AMERICAN: 57 mL/min — AB
GLUCOSE: 81 mg/dL (ref 70–99)
POTASSIUM: 5.3 meq/L (ref 3.5–5.3)
SODIUM: 138 meq/L (ref 135–145)

## 2015-01-02 LAB — IRON AND TIBC
%SAT: 21 % (ref 20–55)
IRON: 71 ug/dL (ref 42–165)
TIBC: 345 ug/dL (ref 215–435)
UIBC: 274 ug/dL (ref 125–400)

## 2015-01-02 LAB — HEPATIC FUNCTION PANEL
ALBUMIN: 4.1 g/dL (ref 3.5–5.2)
ALT: 24 U/L (ref 0–53)
AST: 22 U/L (ref 0–37)
Alkaline Phosphatase: 88 U/L (ref 39–117)
BILIRUBIN INDIRECT: 0.3 mg/dL (ref 0.2–1.2)
BILIRUBIN TOTAL: 0.4 mg/dL (ref 0.2–1.2)
Bilirubin, Direct: 0.1 mg/dL (ref 0.0–0.3)
Total Protein: 6.8 g/dL (ref 6.0–8.3)

## 2015-01-02 LAB — CBC WITH DIFFERENTIAL/PLATELET
BASOS PCT: 0 % (ref 0–1)
Basophils Absolute: 0 10*3/uL (ref 0.0–0.1)
EOS PCT: 0 % (ref 0–5)
Eosinophils Absolute: 0 10*3/uL (ref 0.0–0.7)
HCT: 38.6 % — ABNORMAL LOW (ref 39.0–52.0)
Hemoglobin: 13.2 g/dL (ref 13.0–17.0)
Lymphocytes Relative: 11 % — ABNORMAL LOW (ref 12–46)
Lymphs Abs: 1.2 10*3/uL (ref 0.7–4.0)
MCH: 30.2 pg (ref 26.0–34.0)
MCHC: 34.2 g/dL (ref 30.0–36.0)
MCV: 88.3 fL (ref 78.0–100.0)
MONO ABS: 0.7 10*3/uL (ref 0.1–1.0)
MONOS PCT: 6 % (ref 3–12)
MPV: 10.1 fL (ref 8.6–12.4)
NEUTROS PCT: 83 % — AB (ref 43–77)
Neutro Abs: 9.3 10*3/uL — ABNORMAL HIGH (ref 1.7–7.7)
Platelets: 293 10*3/uL (ref 150–400)
RBC: 4.37 MIL/uL (ref 4.22–5.81)
RDW: 13.7 % (ref 11.5–15.5)
WBC: 11.2 10*3/uL — ABNORMAL HIGH (ref 4.0–10.5)

## 2015-01-02 LAB — LIPID PANEL
CHOL/HDL RATIO: 2.1 ratio
CHOLESTEROL: 124 mg/dL (ref 0–200)
HDL: 59 mg/dL (ref >=40)
LDL CALC: 53 mg/dL (ref 0–99)
TRIGLYCERIDES: 58 mg/dL (ref <150)
VLDL: 12 mg/dL (ref 0–40)

## 2015-01-02 LAB — MAGNESIUM: MAGNESIUM: 2.2 mg/dL (ref 1.5–2.5)

## 2015-01-02 NOTE — Patient Instructions (Signed)

## 2015-01-02 NOTE — Progress Notes (Signed)
Patient ID: [name] Park, male   DOB: 1951-04-08, 64 y.o.   MRN: [id number]   Annual Comprehensive Examination    This very nice 64 y.o. MWM presents for complete physical.  Patient has been followed for Labile HTN, Prediabetes, Hyperlipidemia, GERD and Vitamin D Deficiency. Patient also has hx/o GERD and has recently been transitioned to Ranitidine w/o excessive GERD sx's as long as he's prudent with his diet.     Labile HTN predates since 2004 and has been monitored expectantly. Patient's BP has been controlled over the years.Today's BP: 130/76 mmHg.  In 2004 & in 2011 , he did have Negative Cardiolite testing. Patient denies any cardiac symptoms as chest pain, palpitations, shortness of breath, dizziness or ankle swelling.    Patient's hyperlipidemia is controlled with diet and medications. Patient denies myalgias or other medication SE's. Last lipids were at goal - Cholesterol 132; HDL 59; LDL 60; Trig 65 on 09/07/2014.    Patient has prediabetes since Jan 2014 with A1c 5.9% and patient denies reactive hypoglycemic symptoms, visual blurring, diabetic polys or paresthesias. Last A1c was  5.8% on 09/07/2014.       Finally, patient has history of Vitamin D Deficiency of 31 in 2011 and last vitamin D was 56 on 09/07/2014.      Medication Sig  . aspirin 81 MG tablet Take 81 mg by mouth daily.  Marland Kitchen atorvastatin  40 MG tablet Take 1 tablet (40 mg total) by mouth daily.  Marland Kitchen buPROPion  XL 150 MG 24 hr tablet Take 1 tablet (150 mg total) by mouth daily.  Marland Kitchen VITAMIN D 1000 UNITS  Take 2,000 Units by mouth daily.  . Multiple Vitamin Take 1 tablet by mouth daily.  . ranitidine  150 MG capsule TAKE 1 CAPSULE (150 MG TOTAL) BY MOUTH 2 (TWO) TIMES DAILY.  Marland Kitchen solifenacin (VESICARE) 10 MG tablet Take 1 tablet (10 mg total) by mouth daily.   No Known Allergies   Past Medical History  Diagnosis Date  . COPD (chronic obstructive pulmonary disease)   . GERD (gastroesophageal reflux disease)   . HLD  (hyperlipidemia)   . IBS (irritable bowel syndrome)   . Hypertension   . Pre-diabetes   . Vitamin D deficiency   . Hematuria    Health Maintenance  Topic Date Due  . INFLUENZA VACCINE  01/22/2015  . COLONOSCOPY  11/14/2015  . TETANUS/TDAP  04/28/2021  . ZOSTAVAX  Completed  . HIV Screening  Completed  . PNA vac Low Risk Adult  Completed   Immunization History  Administered Date(s) Administered  . Influenza Whole 04/07/2013  . Influenza, High Dose Seasonal PF 66/22/2015  . PPD Test 66/15/2015  . Pneumococcal Conjugate-13 66/17/2015  . Pneumococcal Polysaccharide-23 66/17/2016  . Tdap 66/11/2010  . Zoster 66/21/2015   Past Surgical History  Procedure Laterality Date  . Cervical fusion    . Trigger finger release Right     thumb  . Carpal tunnel release Right   . Hand surgery Left    Family History  Problem Relation Age of Onset  . Irritable bowel syndrome Mother   . Diverticulosis Mother   . Lupus Mother   . COPD Mother   . Hypertension Mother   . Lung cancer Father   . Other Father     bladder polyps  . Stroke Father   . Kidney disease Father   . Cancer Father    History   Social History  . Marital Status: Married  Spouse Name: N/A  . Number of Children: 1  . Years of Education: N/A   Occupational History  . retired    Social History Main Topics  . Smoking status: Former Smoker -- 1.50 packs/day for 35 years    Types: Cigarettes    Quit date: 02/05/2004  . Smokeless tobacco: Never Used  . Alcohol Use: No  . Drug Use: No  . Sexual Activity: Not on file    ROS Constitutional: Denies fever, chills, weight loss/gain, headaches, insomnia,  night sweats or change in appetite. Does c/o fatigue. Eyes: Denies redness, blurred vision, diplopia, discharge, itchy or watery eyes.  ENT: Denies discharge, congestion, post nasal drip, epistaxis, sore throat, earache, hearing loss, dental pain, Tinnitus, Vertigo, Sinus pain or snoring.  Cardio: Denies chest  pain, palpitations, irregular heartbeat, syncope, dyspnea, diaphoresis, orthopnea, PND, claudication or edema Respiratory: denies cough, dyspnea, DOE, pleurisy, hoarseness, laryngitis or wheezing.  Gastrointestinal: Denies dysphagia, heartburn, reflux, water brash, pain, cramps, nausea, vomiting, bloating, diarrhea, constipation, hematemesis, melena, hematochezia, jaundice or hemorrhoids Genitourinary: Denies dysuria, frequency, urgency, nocturia, hesitancy, discharge, hematuria or flank pain Musculoskeletal: Denies arthralgia, myalgia, stiffness, Jt. Swelling, pain, limp or strain/sprain. Denies Falls. Skin: Denies puritis, rash, hives, warts, acne, eczema or change in skin lesion Neuro: No weakness, tremor, incoordination, spasms, paresthesia or pain Psychiatric: Denies confusion, memory loss or sensory loss. Denies Depression. Endocrine: Denies change in weight, skin, hair change, nocturia, and paresthesia, diabetic polys, visual blurring or hyper / hypo glycemic episodes.  Heme/Lymph: No excessive bleeding, bruising or enlarged lymph nodes.  Physical Exam  BP 130/76   Pulse 88  Temp 97.7 F   Resp 16  Ht 5\' 8"    Wt 184 lb     BMI 27.98  General Appearance: Well nourished, in no apparent distress. Eyes: PERRLA, EOMs, conjunctiva no swelling or erythema, normal fundi and vessels. Sinuses: No frontal/maxillary tenderness ENT/Mouth: EACs patent / TMs  nl. Nares clear without erythema, swelling, mucoid exudates. Oral hygiene is good. No erythema, swelling, or exudate. Tongue normal, non-obstructing. Tonsils not swollen or erythematous. Hearing normal.  Neck: Supple, thyroid normal. No bruits, nodes or JVD. Respiratory: Respiratory effort normal.  BS equal and clear bilateral without rales, rhonci, wheezing or stridor. Cardio: Heart sounds are normal with regular rate and rhythm and no murmurs, rubs or gallops. Peripheral pulses are normal and equal bilaterally without edema. No aortic or  femoral bruits. Chest: symmetric with normal excursions and percussion.  Abdomen: Flat, soft, with bowel sounds. Nontender, no guarding, rebound, hernias, masses, or organomegaly.  Lymphatics: Non tender without lymphadenopathy.  Genitourinary: No hernias.Testes nl. DRE - prostate nl for age - smooth & firm w/o nodules. Musculoskeletal: Full ROM all peripheral extremities, joint stability, 5/5 strength, and normal gait. Skin: Warm and dry without rashes, lesions, cyanosis, clubbing or  ecchymosis.  Neuro: Cranial nerves intact, reflexes equal bilaterally. Normal muscle tone, no cerebellar symptoms. Sensation intact.  Pysch: Awake and oriented X 3 with normal affect, insight and judgment appropriate.   Assessment and Plan  1. Essential hypertension  - Microalbumin / creatinine urine ratio - EKG 12-Lead - [place], RETROPERITNL ABD,  LTD - TSH  2. Hyperlipidemia  - Lipid panel  3. Prediabetes  - Hemoglobin A1c - Insulin, random  4. Vitamin D deficiency  - Vit D  25 hydroxy   5. Gastroesophageal reflux disease   6. COPD  with chronic bronchitis   7. Screening for rectal cancer  - POC Hemoccult Bld/Stl   8. Prostate  cancer screening   9. At low risk for fall   10. Depression screen   11. BMI 27.0-27.9,adult   12. B12 deficiency anemia  - Vitamin B12  13. Iron deficiency anemia  - Iron and TIBC  14. Medication management  - Urine Microscopic - CBC with Differential/Platelet - BASIC METABOLIC PANEL WITH GFR - Hepatic function panel - Magnesium   Continue prudent diet as discussed, weight control, BP monitoring, regular exercise, and medications as discussed.  Discussed med effects and SE's. Routine screening labs and tests as requested with regular follow-up as recommended.  Over 40 minutes of exam, counseling &  chart review was performed

## 2015-01-03 LAB — TSH: TSH: 0.506 u[IU]/mL (ref 0.350–4.500)

## 2015-01-03 LAB — VITAMIN D 25 HYDROXY (VIT D DEFICIENCY, FRACTURES): Vit D, 25-Hydroxy: 72 ng/mL (ref 30–100)

## 2015-01-03 LAB — MICROALBUMIN / CREATININE URINE RATIO
CREATININE, URINE: 71.3 mg/dL
Microalb Creat Ratio: 7 mg/g (ref 0.0–30.0)
Microalb, Ur: 0.5 mg/dL (ref <2.0)

## 2015-01-03 LAB — VITAMIN B12: Vitamin B-12: 528 pg/mL (ref 211–911)

## 2015-01-03 LAB — HEMOGLOBIN A1C
HEMOGLOBIN A1C: 5.9 % — AB (ref <5.7)
MEAN PLASMA GLUCOSE: 123 mg/dL — AB (ref <117)

## 2015-01-03 LAB — URINALYSIS, MICROSCOPIC ONLY
CASTS: NONE SEEN
Crystals: NONE SEEN
Squamous Epithelial / LPF: NONE SEEN

## 2015-01-03 LAB — INSULIN, RANDOM: Insulin: 10.1 u[IU]/mL (ref 2.0–19.6)

## 2015-01-30 ENCOUNTER — Other Ambulatory Visit: Payer: Self-pay | Admitting: *Deleted

## 2015-01-30 DIAGNOSIS — Z1212 Encounter for screening for malignant neoplasm of rectum: Secondary | ICD-10-CM

## 2015-01-30 LAB — POC HEMOCCULT BLD/STL (HOME/3-CARD/SCREEN)
Card #2 Fecal Occult Blod, POC: NEGATIVE
Card #3 Fecal Occult Blood, POC: NEGATIVE
Fecal Occult Blood, POC: NEGATIVE

## 2015-02-22 ENCOUNTER — Other Ambulatory Visit: Payer: Self-pay | Admitting: *Deleted

## 2015-02-22 MED ORDER — RANITIDINE HCL 150 MG PO CAPS: ORAL_CAPSULE | ORAL | Status: DC

## 2015-03-01 ENCOUNTER — Other Ambulatory Visit: Payer: Self-pay | Admitting: *Deleted

## 2015-03-01 MED ORDER — RANITIDINE HCL 150 MG PO TABS: 150.0000 mg | ORAL_TABLET | Freq: Two times a day (BID) | ORAL | Status: DC

## 2015-03-02 ENCOUNTER — Other Ambulatory Visit: Payer: Self-pay | Admitting: *Deleted

## 2015-03-02 MED ORDER — RANITIDINE HCL 150 MG PO TABS: 150.0000 mg | ORAL_TABLET | Freq: Two times a day (BID) | ORAL | Status: DC

## 2015-03-27 ENCOUNTER — Ambulatory Visit (INDEPENDENT_AMBULATORY_CARE_PROVIDER_SITE_OTHER): Payer: Medicare Other | Admitting: *Deleted

## 2015-03-27 DIAGNOSIS — Z23 Encounter for immunization: Secondary | ICD-10-CM | POA: Diagnosis not present

## 2015-04-18 ENCOUNTER — Encounter: Payer: Self-pay | Admitting: Internal Medicine

## 2015-04-18 ENCOUNTER — Ambulatory Visit (INDEPENDENT_AMBULATORY_CARE_PROVIDER_SITE_OTHER): Payer: Medicare Other | Admitting: Internal Medicine

## 2015-04-18 VITALS — BP 144/76 | HR 88 | Temp 97.8°F | Resp 16 | Ht 68.0 in | Wt 181.0 lb

## 2015-04-18 DIAGNOSIS — E785 Hyperlipidemia, unspecified: Secondary | ICD-10-CM | POA: Diagnosis not present

## 2015-04-18 DIAGNOSIS — Z79899 Other long term (current) drug therapy: Secondary | ICD-10-CM | POA: Diagnosis not present

## 2015-04-18 DIAGNOSIS — S76311A Strain of muscle, fascia and tendon of the posterior muscle group at thigh level, right thigh, initial encounter: Secondary | ICD-10-CM

## 2015-04-18 DIAGNOSIS — I10 Essential (primary) hypertension: Secondary | ICD-10-CM

## 2015-04-18 DIAGNOSIS — R7303 Prediabetes: Secondary | ICD-10-CM | POA: Diagnosis not present

## 2015-04-18 LAB — CBC WITH DIFFERENTIAL/PLATELET
Basophils Absolute: 0 10*3/uL (ref 0.0–0.1)
Basophils Relative: 0 % (ref 0–1)
EOS ABS: 0.2 10*3/uL (ref 0.0–0.7)
Eosinophils Relative: 3 % (ref 0–5)
HEMATOCRIT: 41.6 % (ref 39.0–52.0)
HEMOGLOBIN: 13.9 g/dL (ref 13.0–17.0)
LYMPHS ABS: 1.5 10*3/uL (ref 0.7–4.0)
Lymphocytes Relative: 22 % (ref 12–46)
MCH: 30.3 pg (ref 26.0–34.0)
MCHC: 33.4 g/dL (ref 30.0–36.0)
MCV: 90.8 fL (ref 78.0–100.0)
MONOS PCT: 9 % (ref 3–12)
MPV: 10.8 fL (ref 8.6–12.4)
Monocytes Absolute: 0.6 10*3/uL (ref 0.1–1.0)
NEUTROS ABS: 4.4 10*3/uL (ref 1.7–7.7)
NEUTROS PCT: 66 % (ref 43–77)
Platelets: 282 10*3/uL (ref 150–400)
RBC: 4.58 MIL/uL (ref 4.22–5.81)
RDW: 13.1 % (ref 11.5–15.5)
WBC: 6.7 10*3/uL (ref 4.0–10.5)

## 2015-04-18 LAB — HEPATIC FUNCTION PANEL
ALT: 30 U/L (ref 9–46)
AST: 26 U/L (ref 10–35)
Albumin: 4.1 g/dL (ref 3.6–5.1)
Alkaline Phosphatase: 93 U/L (ref 40–115)
BILIRUBIN DIRECT: 0.1 mg/dL (ref <=0.2)
BILIRUBIN INDIRECT: 0.5 mg/dL (ref 0.2–1.2)
TOTAL PROTEIN: 6.9 g/dL (ref 6.1–8.1)
Total Bilirubin: 0.6 mg/dL (ref 0.2–1.2)

## 2015-04-18 LAB — BASIC METABOLIC PANEL WITH GFR
BUN: 19 mg/dL (ref 7–25)
CHLORIDE: 102 mmol/L (ref 98–110)
CO2: 29 mmol/L (ref 20–31)
CREATININE: 1.21 mg/dL (ref 0.70–1.25)
Calcium: 9.7 mg/dL (ref 8.6–10.3)
GFR, EST NON AFRICAN AMERICAN: 62 mL/min (ref >=60)
GFR, Est African American: 72 mL/min (ref >=60)
GLUCOSE: 118 mg/dL — AB (ref 65–99)
Potassium: 4.9 mmol/L (ref 3.5–5.3)
SODIUM: 140 mmol/L (ref 135–146)

## 2015-04-18 MED ORDER — DIAZEPAM 5 MG PO TABS: 5.0000 mg | ORAL_TABLET | Freq: Three times a day (TID) | ORAL | Status: DC | PRN

## 2015-04-18 MED ORDER — PREDNISONE 20 MG PO TABS: ORAL_TABLET | ORAL | Status: DC

## 2015-04-18 NOTE — Patient Instructions (Signed)
Hamstring Strain With Rehab The hamstring muscle and tendons are vulnerable to muscle or tendon tear (strain). Hamstring tears cause pain and inflammation in the backside of the thigh, where the hamstring muscles are located. The hamstring is comprised of three muscles that are responsible for straightening the hip, bending the knee, and stabilizing the knee. These muscles are important for walking, running, and jumping. Hamstring strain is the most common injury of the thigh. Hamstring strains are classified as grade 1 or 2 strains. Grade 1 strains cause pain, but the tendon is not lengthened. Grade 2 strains include a lengthened ligament due to the ligament being stretched or partially ruptured. With grade 2 strains there is still function, although the function may be decreased.  SYMPTOMS   Pain, tenderness, swelling, warmth, or redness over the hamstring muscles, at the back of the thigh.  Pain that gets worse during and after intense activity.  A "pop" heard in the area, at the time of injury.  Muscle spasm in the hamstring muscles.  Pain or weakness with running, jumping, or bending the knee against resistance.  Crackling sound (crepitation) when the tendon is moved or touched.  Bruising (contusion) in the thigh within 48 hours of injury.  Loss of fullness of the muscle, or area of muscle bulging in the case of a complete rupture. CAUSES  A muscle strain occurs when a force is placed on the muscle or tendon that is greater than it can withstand. Common causes of injury include:  Strain from overuse or sudden increase in the frequency, duration, or intensity of activity.  Single violent blow or force to the back of the knee or the hamstring area of the thigh. RISK INCREASES WITH:  Sports that require quick starts (sprinting, racquetball, tennis).  Sports that require jumping (basketball, volleyball).  Kicking sports and water skiing.  Contact sports (soccer, football).  Poor  strength and flexibility.  Failure to warm up properly before activity.  Previous thigh, knee, or pelvis injury.  Poor exercise technique.  Poor posture. PREVENTION  Maintain physical fitness:  Strength, flexibility, and endurance.  Cardiovascular fitness.  Learn and use proper exercise technique and posture.  Wear proper fitted and padded protective equipment. PROGNOSIS  If treated properly, hamstring strains are usually curable in 2 to 6 weeks. RELATED COMPLICATIONS   Longer healing time, if not properly treated or if not given adequate time to heal.  Chronically inflamed tendon, causing persistent pain with activity that may progress to constant pain.  Recurring symptoms, if activity is resumed too soon.  Vulnerable to repeated injury (in up to 25% of cases). TREATMENT  Treatment first involves the use of ice and medication to help reduce pain and inflammation. It is also important to complete strengthening and stretching exercises, as well as modifying any activities that aggravate the symptoms. These exercises may be completed at home or with a therapist. Your caregiver may recommend the use of crutches to help reduce pain and discomfort, especially is the strain is severe enough to cause limping. If the tendon has pulled away from the bone, then surgery is necessary to reattach it. MEDICATION   If pain medicine is needed, nonsteroidal anti-inflammatory medicines (aspirin and ibuprofen), or other minor pain relievers (acetaminophen), are often advised.  Do not take pain medicine for 7 days before surgery.  Prescription pain relievers may be given if your caregiver thinks they are needed. Use only as directed and only as much as you need.  Corticosteroid injections may be   recommended. However, these injections should only be used for serious cases, as they can only be given a certain number of times.  Ointments applied to the skin may be beneficial. HEAT AND  COLD  Cold treatment (icing) relieves pain and reduces inflammation. Cold treatment should be applied for 10 to 15 minutes every 2 to 3 hours, and immediately after activity that aggravates your symptoms. Use ice packs or an ice massage.  Heat treatment may be used before performing the stretching and strengthening activities prescribed by your caregiver, physical therapist, or athletic trainer. Use a heat pack or a warm water soak. SEEK MEDICAL CARE IF:   Symptoms get worse or do not improve in 2 weeks, despite treatment.  New, unexplained symptoms develop. (Drugs used in treatment may produce side effects.) EXERCISES RANGE OF MOTION (ROM) AND STRETCHING EXERCISES - Hamstring Strain These exercises may help you when beginning to rehabilitate your injury. Your symptoms may go away with or without further involvement from your physician, physical therapist or athletic trainer. While completing these exercises, remember:   Restoring tissue flexibility helps normal motion to return to the joints. This allows healthier, less painful movement and activity.  An effective stretch should be held for at least 30 seconds.  A stretch should never be painful. You should only feel a gentle lengthening or release in the stretched tissue. STRETCH - Hamstrings, Standing  Stand or sit, and extend your right / left leg, placing your foot on a chair or foot stool.  Keep a slight arch in your low back and your hips straight forward.  Lead with your chest, and lean forward at the waist until you feel a gentle stretch in the back of your right / left knee or thigh. (When done correctly, this exercise requires leaning only a small distance.)  Hold this position for __________ seconds. Repeat __________ times. Complete this stretch __________ times per day. STRETCH - Hamstrings, Supine   Lie on your back. Loop a belt or towel over the ball of your right / left foot.  Straighten your right / left knee and  slowly pull on the belt to raise your leg. Do not allow the right / left knee to bend. Keep your opposite leg flat on the floor.  Raise the leg until you feel a gentle stretch behind your right / left knee or thigh. Hold this position for __________ seconds. Repeat __________ times. Complete this stretch __________ times per day.  STRETCH - Hamstrings, Doorway  Lie on your back with your right / left leg extended and resting on the wall, and the opposite leg flat on the ground through the door. Initially, position your bottom farther away from the wall.  Keep your right / left knee straight. If you feel a stretch behind your knee or thigh, hold this position for __________ seconds.  If you do not feel a stretch, scoot your bottom closer to the door and hold __________ seconds. Repeat __________ times. Complete this stretch __________ times per day.  STRETCH - Hamstrings/Adductors, V-Sit   Sit on the floor with your legs extended in a large "V," keeping your knees straight.  With your head and chest upright, bend at your waist reaching for your left foot to stretch your right thigh muscles.  You should feel a stretch in your right inner thigh. Hold for __________ seconds.  Return to the upright position to relax your leg muscles.  Continuing to keep your chest upright, bend straight forward at your  waist to stretch your hamstrings.  You should feel a stretch behind both of your thighs and knees. Hold for __________ seconds.  Return to the upright position to relax your leg muscles.  With your head and chest upright, bend at your waist reaching for your right foot to stretch your left thigh muscles.  You should feel a stretch in your left inner thigh. Hold for __________ seconds.  Return to the upright position to relax your leg muscles. Repeat __________ times. Complete this exercise __________ times per day.  STRENGTHENING EXERCISES - Hamstring Strain These exercises may help you  when beginning to rehabilitate your injury. They may resolve your symptoms with or without further involvement from your physician, physical therapist or athletic trainer. While completing these exercises, remember:   Muscles can gain both the endurance and the strength needed for everyday activities through controlled exercises.  Complete these exercises as instructed by your physician, physical therapist or athletic trainer. Increase the resistance and repetitions only as guided.  You may experience muscle soreness or fatigue, but the pain or discomfort you are trying to eliminate should never get worse during these exercises. If this pain does get worse, stop and make certain you are following the directions exactly. If the pain is still present after adjustments, discontinue the exercise until you can discuss the trouble with your clinician. STRENGTH - Hip Extensors, Straight Leg Raises   Lie on your stomach on a firm surface.  Tense the muscles in your buttocks to lift your right / left leg about 4 inches. If you cannot lift your leg this high without arching your back, place a pillow under your hips.  Keep your knee straight. Hold for __________ seconds.  Slowly lower your leg to the starting position and allow it to relax completely before starting the next repetition. Repeat __________ times. Complete this exercise __________ times per day.  STRENGTH - Hamstring, Isometrics   Lie on your back on a firm surface.  Bend your right / left knee approximately __________ degrees.  Dig your heel into the surface, as if you are trying to pull it toward your buttocks. Tighten the muscles in the back of your thighs to "dig" as hard as you can, without increasing any pain.  Hold this position for __________ seconds.  Release the tension gradually and allow your muscles to completely relax for __________ seconds between each exercise. Repeat __________ times. Complete this exercise __________  times per day.  STRENGTH - Hamstring, Curls   Lay on your stomach with your legs extended. (If you lay on a bed, your feet may hang over the edge.)  Tighten the muscles in the back of your thigh to bend your right / left knee up to 90 degrees. Keep your hips flat on the bed or floor.  Hold this position for __________ seconds.  Slowly lower your leg back to the starting position. Repeat __________ times. Complete this exercise __________ times per day.  OPTIONAL ANKLE WEIGHTS: Begin with ____________________, but DO NOT exceed ____________________. Increase in 1 pound/0.5 kilogram increments.   This information is not intended to replace advice given to you by your health care provider. Make sure you discuss any questions you have with your health care provider.   Document Released: 06/09/2005 Document Revised: 06/30/2014 Document Reviewed: 09/21/2008 Elsevier Interactive Patient Education 2016 [employer]  

## 2015-04-18 NOTE — Progress Notes (Signed)
Patient ID: [name] Park, male   DOB: 06-Oct-1950, 64 y.o.   MRN: [id number]  Assessment and Plan:  Hypertension:  -Continue medication,  -monitor blood pressure at home.  -Continue DASH diet.   -Reminder to go to the ER if any CP, SOB, nausea, dizziness, severe HA, changes vision/speech, left arm numbness and tingling, and jaw pain.  Cholesterol: -Continue diet and exercise.  -Check cholesterol.   Pre-diabetes: -Continue diet and exercise.  -Check A1C  Vitamin D Def: -check level -continue medications.   Right hamstring strain -no palpable deformity -heat -prednisone -valium prn  Continue diet and meds as discussed. Further disposition pending results of labs.  HPI 64 y.o. male  presents for 3 month follow up with hypertension, hyperlipidemia, prediabetes and vitamin D.   His blood pressure has been controlled at home, today their BP is BP: (!) 144/76 mmHg.   He does workout. He denies chest pain, shortness of breath, dizziness.   He is on cholesterol medication and denies myalgias. His cholesterol is at goal. The cholesterol last visit was:   Lab Results  Component Value Date   CHOL 124 01/02/2015   HDL 59 01/02/2015   LDLCALC 53 01/02/2015   TRIG 58 01/02/2015   CHOLHDL 2.1 01/02/2015     He has been working on diet and exercise for prediabetes, and denies foot ulcerations, hyperglycemia, hypoglycemia , increased appetite, nausea, paresthesia of the feet, polydipsia, polyuria, visual disturbances, vomiting and weight loss. Last A1C in the office was:  Lab Results  Component Value Date   HGBA1C 5.9* 01/02/2015    Patient is on Vitamin D supplement.  Lab Results  Component Value Date   VD25OH 72 01/02/2015     Patient reports that he has been having a hard time with his right hamstring.  He reports that he was reaching around a tree when he felt his hamstring tighten.  This happened about a week and a half ago.  He reports that he took 3 days of prednisone  which helped a little.  No bruising.  No swelling.  Somewhat uncomfortable.  He has not used ice or heat.     Current Medications:  Current Outpatient Prescriptions on File Prior to Visit  Medication Sig Dispense Refill  . aspirin 81 MG tablet Take 81 mg by mouth daily.    Marland Kitchen atorvastatin (LIPITOR) 40 MG tablet Take 1 tablet (40 mg total) by mouth daily. 90 tablet 3  . buPROPion (WELLBUTRIN XL) 150 MG 24 hr tablet Take 1 tablet (150 mg total) by mouth daily. 90 tablet 3  . Cholecalciferol (VITAMIN D-3) 1000 UNITS CAPS Take 2,000 Units by mouth daily.    . Multiple Vitamin (MULTIVITAMIN) tablet Take 1 tablet by mouth daily.    . ranitidine (ZANTAC) 150 MG tablet Take 1 tablet (150 mg total) by mouth 2 (two) times daily. 180 tablet 3  . solifenacin (VESICARE) 10 MG tablet Take 1 tablet (10 mg total) by mouth daily. 90 tablet 3   No current facility-administered medications on file prior to visit.    Medical History:  Past Medical History  Diagnosis Date  . COPD (chronic obstructive pulmonary disease) ([place])   . GERD (gastroesophageal reflux disease)   . HLD (hyperlipidemia)   . IBS (irritable bowel syndrome)   . Hypertension   . Pre-diabetes   . Vitamin D deficiency   . Hematuria     Allergies: No Known Allergies   Review of Systems:  Review of Systems  Constitutional:  Negative for fever, chills and malaise/fatigue.  HENT: Negative for congestion, ear pain and sore throat.   Respiratory: Negative for cough, shortness of breath and wheezing.   Cardiovascular: Negative for chest pain, palpitations and leg swelling.  Gastrointestinal: Negative for heartburn, diarrhea, constipation, blood in stool and melena.  Genitourinary: Negative.   Musculoskeletal: Positive for myalgias.  Skin: Negative.   Neurological: Negative for dizziness, sensory change, loss of consciousness and headaches.  Psychiatric/Behavioral: Negative for depression. The patient is not nervous/anxious and does not  have insomnia.     Family history- Review and unchanged  Social history- Review and unchanged  Physical Exam: BP 144/76 mmHg  Pulse 88  Temp(Src) 97.8 F (36.6 C) (Temporal)  Resp 16  Ht 5\' 8"  (1.727 m)  Wt 181 lb (82.101 kg)  BMI 27.53 kg/m2 Wt Readings from Last 3 Encounters:  04/18/15 181 lb (82.101 kg)  01/02/15 184 lb (83.462 kg)  09/07/14 182 lb (82.555 kg)    General Appearance: Well nourished well developed, in no apparent distress. Eyes: PERRLA, EOMs, conjunctiva no swelling or erythema ENT/Mouth: Ear canals normal without obstruction, swelling, erythma, discharge.  TMs normal bilaterally.  Oropharynx moist, clear, without exudate, or postoropharyngeal swelling. Neck: Supple, thyroid normal,no cervical adenopathy  Respiratory: Respiratory effort normal, Breath sounds clear A&P without rhonchi, wheeze, or rale.  No retractions, no accessory usage. Cardio: RRR with no MRGs. Brisk peripheral pulses without edema.  Abdomen: Soft, + BS,  Non tender, no guarding, rebound, hernias, masses. Musculoskeletal: Full ROM, 5/5 strength, Normal gait Skin: Warm, dry without rashes, lesions, ecchymosis.  Neuro: Awake and oriented X 3, Cranial nerves intact. Normal muscle tone, no cerebellar symptoms. Psych: Normal affect, Insight and Judgment appropriate.    Starlyn Skeans, PA-C 11:04 AM [facility]

## 2015-04-23 DIAGNOSIS — H25042 Posterior subcapsular polar age-related cataract, left eye: Secondary | ICD-10-CM | POA: Diagnosis not present

## 2015-04-23 DIAGNOSIS — H2512 Age-related nuclear cataract, left eye: Secondary | ICD-10-CM | POA: Diagnosis not present

## 2015-05-08 DIAGNOSIS — H25042 Posterior subcapsular polar age-related cataract, left eye: Secondary | ICD-10-CM | POA: Diagnosis not present

## 2015-05-08 DIAGNOSIS — H25812 Combined forms of age-related cataract, left eye: Secondary | ICD-10-CM | POA: Diagnosis not present

## 2015-05-08 DIAGNOSIS — H52202 Unspecified astigmatism, left eye: Secondary | ICD-10-CM | POA: Diagnosis not present

## 2015-05-08 DIAGNOSIS — H25012 Cortical age-related cataract, left eye: Secondary | ICD-10-CM | POA: Diagnosis not present

## 2015-05-08 DIAGNOSIS — H2512 Age-related nuclear cataract, left eye: Secondary | ICD-10-CM | POA: Diagnosis not present

## 2015-07-17 ENCOUNTER — Other Ambulatory Visit: Payer: Self-pay | Admitting: Internal Medicine

## 2015-07-19 ENCOUNTER — Ambulatory Visit (INDEPENDENT_AMBULATORY_CARE_PROVIDER_SITE_OTHER): Payer: Medicare Other | Admitting: Internal Medicine

## 2015-07-19 ENCOUNTER — Encounter: Payer: Self-pay | Admitting: Internal Medicine

## 2015-07-19 VITALS — BP 146/84 | HR 88 | Temp 97.3°F | Resp 16 | Ht 68.0 in | Wt 185.8 lb

## 2015-07-19 DIAGNOSIS — I10 Essential (primary) hypertension: Secondary | ICD-10-CM | POA: Diagnosis not present

## 2015-07-19 DIAGNOSIS — R7309 Other abnormal glucose: Secondary | ICD-10-CM | POA: Diagnosis not present

## 2015-07-19 DIAGNOSIS — R7303 Prediabetes: Secondary | ICD-10-CM

## 2015-07-19 DIAGNOSIS — Z79899 Other long term (current) drug therapy: Secondary | ICD-10-CM

## 2015-07-19 DIAGNOSIS — J449 Chronic obstructive pulmonary disease, unspecified: Secondary | ICD-10-CM | POA: Diagnosis not present

## 2015-07-19 DIAGNOSIS — E559 Vitamin D deficiency, unspecified: Secondary | ICD-10-CM

## 2015-07-19 DIAGNOSIS — E785 Hyperlipidemia, unspecified: Secondary | ICD-10-CM

## 2015-07-19 DIAGNOSIS — N3281 Overactive bladder: Secondary | ICD-10-CM

## 2015-07-19 LAB — BASIC METABOLIC PANEL WITH GFR
BUN: 16 mg/dL (ref 7–25)
CHLORIDE: 102 mmol/L (ref 98–110)
CO2: 28 mmol/L (ref 20–31)
Calcium: 9.6 mg/dL (ref 8.6–10.3)
Creat: 1.45 mg/dL — ABNORMAL HIGH (ref 0.70–1.25)
GFR, EST NON AFRICAN AMERICAN: 50 mL/min — AB (ref >=60)
GFR, Est African American: 58 mL/min — ABNORMAL LOW (ref >=60)
Glucose, Bld: 97 mg/dL (ref 65–99)
POTASSIUM: 4.4 mmol/L (ref 3.5–5.3)
Sodium: 139 mmol/L (ref 135–146)

## 2015-07-19 LAB — HEMOGLOBIN A1C
Hgb A1c MFr Bld: 5.8 % — ABNORMAL HIGH (ref <5.7)
Mean Plasma Glucose: 120 mg/dL — ABNORMAL HIGH (ref <117)

## 2015-07-19 LAB — CBC WITH DIFFERENTIAL/PLATELET
BASOS ABS: 0 10*3/uL (ref 0.0–0.1)
BASOS PCT: 0 % (ref 0–1)
EOS ABS: 0.2 10*3/uL (ref 0.0–0.7)
EOS PCT: 3 % (ref 0–5)
HEMATOCRIT: 42 % (ref 39.0–52.0)
Hemoglobin: 14.1 g/dL (ref 13.0–17.0)
LYMPHS PCT: 25 % (ref 12–46)
Lymphs Abs: 1.5 10*3/uL (ref 0.7–4.0)
MCH: 30.4 pg (ref 26.0–34.0)
MCHC: 33.6 g/dL (ref 30.0–36.0)
MCV: 90.5 fL (ref 78.0–100.0)
MONO ABS: 0.5 10*3/uL (ref 0.1–1.0)
MPV: 11 fL (ref 8.6–12.4)
Monocytes Relative: 9 % (ref 3–12)
Neutro Abs: 3.8 10*3/uL (ref 1.7–7.7)
Neutrophils Relative %: 63 % (ref 43–77)
PLATELETS: 288 10*3/uL (ref 150–400)
RBC: 4.64 MIL/uL (ref 4.22–5.81)
RDW: 13.3 % (ref 11.5–15.5)
WBC: 6 10*3/uL (ref 4.0–10.5)

## 2015-07-19 LAB — LIPID PANEL
CHOL/HDL RATIO: 2.4 ratio (ref <=5.0)
Cholesterol: 144 mg/dL (ref 125–200)
HDL: 61 mg/dL (ref >=40)
LDL CALC: 67 mg/dL (ref <130)
Triglycerides: 82 mg/dL (ref <150)
VLDL: 16 mg/dL (ref <30)

## 2015-07-19 LAB — MAGNESIUM: Magnesium: 2 mg/dL (ref 1.5–2.5)

## 2015-07-19 LAB — HEPATIC FUNCTION PANEL
ALBUMIN: 4.2 g/dL (ref 3.6–5.1)
ALK PHOS: 96 U/L (ref 40–115)
ALT: 35 U/L (ref 9–46)
AST: 30 U/L (ref 10–35)
BILIRUBIN INDIRECT: 0.4 mg/dL (ref 0.2–1.2)
BILIRUBIN TOTAL: 0.5 mg/dL (ref 0.2–1.2)
Bilirubin, Direct: 0.1 mg/dL (ref <=0.2)
Total Protein: 6.9 g/dL (ref 6.1–8.1)

## 2015-07-19 LAB — TSH: TSH: 0.878 u[IU]/mL (ref 0.350–4.500)

## 2015-07-19 MED ORDER — SOLIFENACIN SUCCINATE 10 MG PO TABS: 10.0000 mg | ORAL_TABLET | Freq: Every day | ORAL | Status: DC

## 2015-07-19 NOTE — Patient Instructions (Signed)

## 2015-07-19 NOTE — Progress Notes (Signed)
Patient ID: [name] Park, male   DOB: 11/09/1950, 65 y.o.   MRN: [id number]   This very nice 65 y.o. MWM presents for follow up with Hypertension, Hyperlipidemia, Pre-Diabetes and Vitamin D Deficiency.    Patient is followed closely  for labileHTN & BP has been controlled at home ranging betw 120-130/70's. Today's BP is 146/84.  Patient has had no complaints of any cardiac type chest pain, palpitations, dyspnea/orthopnea/PND, dizziness, claudication, or dependent edema.   Hyperlipidemia is controlled with diet & meds. Patient denies myalgias or other med SE's. Current Lipids are at goal with Cholesterol 144; HDL 61; LDL 67; and Triglycerides 82.   Also, the patient has history of PreDiabetes since early 2011 with A1c 5.8% and then 6.3% later that year. He has had no symptoms of reactive hypoglycemia, diabetic polys, paresthesias or visual blurring.  Last A1c was 5.9% on 01/02/2015.    Further, the patient also has history of Vitamin D Deficiency of "31" in 2013 and he supplements vitamin D without any suspected side-effects. Last vitamin D was 72 on 01/02/2015.     Medication Sig  . aspirin 81 MG tablet Take 81 mg by mouth daily.  Marland Kitchen atorvastatin40 MG  Take 1 tablet (40 mg total) by mouth daily.  Marland Kitchen buPROPion-XL 150 MG  Take 1 tablet by mouth  daily  . VITAMIN D 1000 UNITS  Take 2,000 Units by mouth daily.  . Multiple Vitamin  Take 1 tablet by mouth daily.  . ranitidine  150 MG  Take 1 tablet (150 mg total) by mouth 2 (two) times daily.  . solifenacin 10 MG  Take 1 tablet (10 mg total) by mouth daily.   No Known Allergies  PMHx:   Past Medical History  Diagnosis Date  . COPD (chronic obstructive pulmonary disease) ([place])   . GERD (gastroesophageal reflux disease)   . HLD (hyperlipidemia)   . IBS (irritable bowel syndrome)   . Hypertension   . Pre-diabetes   . Vitamin D deficiency   . Hematuria    Immunization History  Administered Date(s) Administered  . Influenza Whole 04/07/2013   . Influenza, High Dose Seasonal PF 04/13/2014, 03/27/2015  . PPD Test 12/05/2013  . Pneumococcal Conjugate-13 06/08/2014  . Pneumococcal Polysaccharide-23 09/07/2014  . Tdap 04/29/2011  . Zoster 06/12/2014   Past Surgical History  Procedure Laterality Date  . Cervical fusion    . Trigger finger release Right     thumb  . Carpal tunnel release Right   . Hand surgery Left    FHx:    Reviewed / unchanged  SHx:    Reviewed / unchanged  Systems Review:  Constitutional: Denies fever, chills, wt changes, headaches, insomnia, fatigue, night sweats, change in appetite. Eyes: Denies redness, blurred vision, diplopia, discharge, itchy, watery eyes.  ENT: Denies discharge, congestion, post nasal drip, epistaxis, sore throat, earache, hearing loss, dental pain, tinnitus, vertigo, sinus pain, snoring.  CV: Denies chest pain, palpitations, irregular heartbeat, syncope, dyspnea, diaphoresis, orthopnea, PND, claudication or edema. Respiratory: denies cough, dyspnea, DOE, pleurisy, hoarseness, laryngitis, wheezing.  Gastrointestinal: Denies dysphagia, odynophagia, heartburn, reflux, water brash, abdominal pain or cramps, nausea, vomiting, bloating, diarrhea, constipation, hematemesis, melena, hematochezia  or hemorrhoids. Genitourinary: Denies dysuria, frequency, urgency, nocturia, hesitancy, discharge, hematuria or flank pain. Musculoskeletal: Denies arthralgias, myalgias, stiffness, jt. swelling, pain, limping or strain/sprain.  Skin: Denies pruritus, rash, hives, warts, acne, eczema or change in skin lesion(s). Neuro: No weakness, tremor, incoordination, spasms, paresthesia or pain. Psychiatric: Denies confusion, memory loss  or sensory loss. Endo: Denies change in weight, skin or hair change.  Heme/Lymph: No excessive bleeding, bruising or enlarged lymph nodes.  Physical Exam  BP 146/84 mmHg  Pulse 88  Temp(Src) 97.3 F (36.3 C)  Resp 16  Ht 5\' 8"  (1.727 m)  Wt 185 lb 12.8 oz (84.278 kg)   BMI 28.26 kg/m2  Appears well nourished and in no distress. Eyes: PERRLA, EOMs, conjunctiva no swelling or erythema. Sinuses: No frontal/maxillary tenderness ENT/Mouth: EAC's clear, TM's nl w/o erythema, bulging. Nares clear w/o erythema, swelling, exudates. Oropharynx clear without erythema or exudates. Oral hygiene is good. Tongue normal, non obstructing. Hearing intact.  Neck: Supple. Thyroid nl. Car 2+/2+ without bruits, nodes or JVD. Chest: Respirations nl with BS clear & equal w/o rales, rhonchi, wheezing or stridor.  Cor: Heart sounds normal w/ regular rate and rhythm without sig. murmurs, gallops, clicks, or rubs. Peripheral pulses normal and equal  without edema.  Abdomen: Soft & bowel sounds normal. Non-tender w/o guarding, rebound, hernias, masses, or organomegaly.  Lymphatics: Unremarkable.  Musculoskeletal: Full ROM all peripheral extremities, joint stability, 5/5 strength, and normal gait.  Skin: Warm, dry without exposed rashes, lesions or ecchymosis apparent.  Neuro: Cranial nerves intact, reflexes equal bilaterally. Sensory-motor testing grossly intact. Tendon reflexes grossly intact.  Pysch: Alert & oriented x 3.  Insight and judgement nl & appropriate. No ideations.  Assessment and Plan:  1. Essential hypertension  - TSH  2. Hyperlipidemia  - Lipid panel - TSH  3. Vitamin D deficiency  - Hemoglobin A1c - Insulin, random  4. Prediabetes  - VITAMIN D 25 Hydroxy   5. COPD  ([place])   6. OAB (overactive bladder)  - solifenacin (VESICARE) 10 MG tablet; Take 1 tablet (10 mg total) by mouth daily.  Dispense: 90 tablet; Refill: 3  7. Medication management  - CBC with Differential/Platelet - BASIC METABOLIC PANEL WITH GFR - Hepatic function panel - Magnesium   Recommended regular exercise, BP monitoring, weight control, and discussed med and SE's. Recommended labs to assess and monitor clinical status. Further disposition pending results of labs. Over 30  minutes of exam, counseling, chart review was performed

## 2015-07-20 LAB — VITAMIN D 25 HYDROXY (VIT D DEFICIENCY, FRACTURES): Vit D, 25-Hydroxy: 60 ng/mL (ref 30–100)

## 2015-07-20 LAB — INSULIN, RANDOM: INSULIN: 31.2 u[IU]/mL — AB (ref 2.0–19.6)

## 2015-08-07 ENCOUNTER — Encounter: Payer: Self-pay | Admitting: Internal Medicine

## 2015-08-07 ENCOUNTER — Ambulatory Visit (INDEPENDENT_AMBULATORY_CARE_PROVIDER_SITE_OTHER): Payer: Medicare Other | Admitting: Internal Medicine

## 2015-08-07 VITALS — BP 150/90 | HR 84 | Temp 97.3°F | Resp 16 | Ht 68.0 in | Wt 185.4 lb

## 2015-08-07 DIAGNOSIS — I10 Essential (primary) hypertension: Secondary | ICD-10-CM | POA: Diagnosis not present

## 2015-08-07 DIAGNOSIS — Z79899 Other long term (current) drug therapy: Secondary | ICD-10-CM | POA: Diagnosis not present

## 2015-08-07 LAB — BASIC METABOLIC PANEL WITH GFR
BUN: 17 mg/dL (ref 7–25)
CALCIUM: 9.5 mg/dL (ref 8.6–10.3)
CHLORIDE: 103 mmol/L (ref 98–110)
CO2: 26 mmol/L (ref 20–31)
Creat: 1.31 mg/dL — ABNORMAL HIGH (ref 0.70–1.25)
GFR, Est African American: 65 mL/min (ref >=60)
GFR, Est Non African American: 56 mL/min — ABNORMAL LOW (ref >=60)
Glucose, Bld: 92 mg/dL (ref 65–99)
Potassium: 4.8 mmol/L (ref 3.5–5.3)
SODIUM: 139 mmol/L (ref 135–146)

## 2015-08-07 MED ORDER — BISOPROLOL-HYDROCHLOROTHIAZIDE 5-6.25 MG PO TABS: ORAL_TABLET | ORAL | Status: DC

## 2015-08-07 NOTE — Patient Instructions (Addendum)
Hypertension Hypertension, commonly called high blood pressure, is when the force of blood pumping through your arteries is too strong. Your arteries are the blood vessels that carry blood from your heart throughout your body. A blood pressure reading consists of a higher number over a lower number, such as 110/72. The higher number (systolic) is the pressure inside your arteries when your heart pumps. The lower number (diastolic) is the pressure inside your arteries when your heart relaxes. Ideally you want your blood pressure below 120/80. Hypertension forces your heart to work harder to pump blood. Your arteries may become narrow or stiff. Having untreated or uncontrolled hypertension can cause heart attack, stroke, kidney disease, and other problems. RISK FACTORS Some risk factors for high blood pressure are controllable. Others are not.  Risk factors you cannot control include:   Race. You may be at higher risk if you are African American.  Age. Risk increases with age.  Gender. Men are at higher risk than women before age 45 years. After age 65, women are at higher risk than men. Risk factors you can control include:  Not getting enough exercise or physical activity.  Being overweight.  Getting too much fat, sugar, calories, or salt in your diet.  Drinking too much alcohol. SIGNS AND SYMPTOMS Hypertension does not usually cause signs or symptoms. Extremely high blood pressure (hypertensive crisis) may cause headache, anxiety, shortness of breath, and nosebleed. DIAGNOSIS To check if you have hypertension, your health care provider will measure your blood pressure while you are seated, with your arm held at the level of your heart. It should be measured at least twice using the same arm. Certain conditions can cause a difference in blood pressure between your right and left arms. A blood pressure reading that is higher than normal on one occasion does not mean that you need treatment. If  it is not clear whether you have high blood pressure, you may be asked to return on a different day to have your blood pressure checked again. Or, you may be asked to monitor your blood pressure at home for 1 or more weeks. TREATMENT Treating high blood pressure includes making lifestyle changes and possibly taking medicine. Living a healthy lifestyle can help lower high blood pressure. You may need to change some of your habits. Lifestyle changes may include:  Following the DASH diet. This diet is high in fruits, vegetables, and whole grains. It is low in salt, red meat, and added sugars.  Keep your sodium intake below 2,300 mg per day.  Getting at least 30-45 minutes of aerobic exercise at least 4 times per week.  Losing weight if necessary.  Not smoking.  Limiting alcoholic beverages.  Learning ways to reduce stress. Your health care provider may prescribe medicine if lifestyle changes are not enough to get your blood pressure under control, and if one of the following is true:  You are 18-59 years of age and your systolic blood pressure is above 140.  You are 60 years of age or older, and your systolic blood pressure is above 150.  Your diastolic blood pressure is above 90.  You have diabetes, and your systolic blood pressure is over 140 or your diastolic blood pressure is over 90.  You have kidney disease and your blood pressure is above 140/90.  You have heart disease and your blood pressure is above 140/90. Your personal target blood pressure may vary depending on your medical conditions, your age, and other factors. HOME CARE INSTRUCTIONS    140/90.   You have heart disease and your blood pressure is above 140/90.  Your personal target blood pressure may vary depending on your medical conditions, your age, and other factors.  HOME CARE INSTRUCTIONS   Have your blood pressure rechecked as directed by your health care provider.    Take medicines only as directed by your health care provider. Follow the directions carefully. Blood pressure medicines must be taken as prescribed. The medicine does not work as well when you skip doses. Skipping doses also puts you at risk for  problems.   Do not smoke.    Monitor your blood pressure at home as directed by your health care provider.  SEEK MEDICAL CARE IF:    You think you are having a reaction to medicines taken.   You have recurrent headaches or feel dizzy.   You have swelling in your ankles.   You have trouble with your vision.  SEEK IMMEDIATE MEDICAL CARE IF:   You develop a severe headache or confusion.   You have unusual weakness, numbness, or feel faint.   You have severe chest or abdominal pain.   You vomit repeatedly.   You have trouble breathing.  MAKE SURE YOU:    Understand these instructions.   Will watch your condition.   Will get help right away if you are not doing well or get worse.

## 2015-08-07 NOTE — Progress Notes (Signed)
  Subjective:    Patient ID: [name] Park, male    DOB: [birth year], 65 y.o.   MRN: [id number]  HPI  Very nice 65 yo MWM with hx/o Labile HTN brought in by wife with a list of numerous BP's with Sys ranging betw 130's up to 160's and Dias ranging  up to the 90's. Denies HT sx's. Today's BP is 150/90  Medication Sig  . aspirin 81 MG tablet Take 81 mg by mouth daily.  Marland Kitchen atorvastatin (LIPITOR) 40 MG tablet Take 1 tablet (40 mg total) by mouth daily.  Marland Kitchen buPROPion (WELLBUTRIN XL) 150 MG 24 hr tablet Take 1 tablet by mouth  daily  . Cholecalciferol (VITAMIN D-3) 1000 UNITS CAPS Take 2,000 Units by mouth daily.  . Multiple Vitamin (MULTIVITAMIN) tablet Take 1 tablet by mouth daily.  . ranitidine (ZANTAC) 150 MG tablet Take 1 tablet (150 mg total) by mouth 2 (two) times daily.  . solifenacin (VESICARE) 10 MG tablet Take 1 tablet (10 mg total) by mouth daily.   No Known Allergies   Past Medical History  Diagnosis Date  . COPD (chronic obstructive pulmonary disease) ([place])   . GERD (gastroesophageal reflux disease)   . HLD (hyperlipidemia)   . IBS (irritable bowel syndrome)   . Hypertension   . Pre-diabetes   . Vitamin D deficiency   . Hematuria    Review of Systems  10 point systems review negative except as above, but is still having some mild R sciatica (posterior thigh)     Objective:   Physical Exam  BP 150/90 mmHg  Pulse 84  Temp(Src) 97.3 F (36.3 C)  Resp 16  Ht 5\' 8"  (1.727 m)  Wt 185 lb 6.4 oz (84.097 kg)  BMI 28.20 kg/m2  HEENT - Eac's patent. TM's Nl. EOM's full. PERRLA. NasoOroPharynx clear. Neck - supple. Nl Thyroid. Carotids 2+ & No bruits, nodes, JVD Chest - Clear equal BS w/o Rales, rhonchi, wheezes. Cor - Nl HS. RRR w/o sig MGR. PP 1(+). No edema. MS- FROM w/o deformities. Muscle power, tone and bulk Nl. Gait Nl. Neuro - No obvious Cr N abnormalities. Sensory, motor and Cerebellar functions appear Nl w/o focal abnormalities.     Assessment & Plan:   1.  Essential hypertension  - bisoprolol-hydrochlorothiazide (ZIAC) 5-6.25 MG tablet; Take 1 tablet every morning for BP  Dispense: 90 tablet; Refill: 1 - discussed med/SE's -ROV 1 mo   2. Medication management  - BASIC METABOLIC PANEL WITH GFR

## 2015-08-10 ENCOUNTER — Encounter: Payer: Self-pay | Admitting: Internal Medicine

## 2015-08-14 ENCOUNTER — Telehealth: Payer: Self-pay | Admitting: *Deleted

## 2015-08-14 NOTE — Telephone Encounter (Signed)
Patient called and reported his pulse rate is in the 50's and has dropped to upper 40's, but his BP is good.  The patient started taking 1/2 tablet last weekend due to the low pulse.  Per Dr Melford Aase, since his BP is OK, it is OK to continue with 1/2 tablet.  Patient is aware.

## 2015-08-30 ENCOUNTER — Encounter: Payer: Self-pay | Admitting: Gastroenterology

## 2015-09-06 ENCOUNTER — Ambulatory Visit (INDEPENDENT_AMBULATORY_CARE_PROVIDER_SITE_OTHER): Payer: Medicare Other | Admitting: Physician Assistant

## 2015-09-06 ENCOUNTER — Encounter: Payer: Self-pay | Admitting: Internal Medicine

## 2015-09-06 VITALS — BP 122/66 | HR 72 | Temp 97.3°F | Resp 16 | Ht 68.0 in | Wt 188.0 lb

## 2015-09-06 DIAGNOSIS — Z79899 Other long term (current) drug therapy: Secondary | ICD-10-CM

## 2015-09-06 DIAGNOSIS — E785 Hyperlipidemia, unspecified: Secondary | ICD-10-CM | POA: Diagnosis not present

## 2015-09-06 DIAGNOSIS — K219 Gastro-esophageal reflux disease without esophagitis: Secondary | ICD-10-CM

## 2015-09-06 DIAGNOSIS — R7303 Prediabetes: Secondary | ICD-10-CM

## 2015-09-06 DIAGNOSIS — E559 Vitamin D deficiency, unspecified: Secondary | ICD-10-CM | POA: Diagnosis not present

## 2015-09-06 DIAGNOSIS — Z0001 Encounter for general adult medical examination with abnormal findings: Secondary | ICD-10-CM | POA: Diagnosis not present

## 2015-09-06 DIAGNOSIS — I10 Essential (primary) hypertension: Secondary | ICD-10-CM | POA: Diagnosis not present

## 2015-09-06 DIAGNOSIS — J449 Chronic obstructive pulmonary disease, unspecified: Secondary | ICD-10-CM | POA: Diagnosis not present

## 2015-09-06 DIAGNOSIS — R6889 Other general symptoms and signs: Secondary | ICD-10-CM | POA: Diagnosis not present

## 2015-09-06 DIAGNOSIS — Z0000 Encounter for general adult medical examination without abnormal findings: Secondary | ICD-10-CM

## 2015-09-06 DIAGNOSIS — N3281 Overactive bladder: Secondary | ICD-10-CM | POA: Diagnosis not present

## 2015-09-06 NOTE — Progress Notes (Signed)
MEDICARE VISIT AND FOLLOW UP  Assessment:   1. COPD (chronic obstructive pulmonary disease) with chronic bronchitis Not smoking x 11 years, no exacerbations, continue exercise.   2. Generalized anxiety disorder Anxiety-  continue medications, stress management techniques discussed, increase water, good sleep hygiene discussed, increase exercise, and increase veggies.   3. Essential hypertension - continue medications, DASH diet, exercise and monitor at home. Call if greater than 130/80.  - CBC with Differential/Platelet - BASIC METABOLIC PANEL WITH GFR - Hepatic function panel - TSH  4. Prediabetes Discussed general issues about diabetes pathophysiology and management., Educational material distributed., Suggested low cholesterol diet., Encouraged aerobic exercise., Discussed foot care., Reminded to get yearly retinal exam. - Hemoglobin A1c - Insulin, fasting - HM DIABETES FOOT EXAM  5. Hyperlipidemia -continue medications, check lipids, decrease fatty foods, increase activity. - Lipid panel  6. Medication management - Magnesium  7. Vitamin D deficiency - Vit D  25 hydroxy (rtn osteoporosis monitoring)  8. OAB (overactive bladder) Continue vesicare  9. Diverticulitis of colon without hemorrhage DUE for colonoscopy this year  10. Reflux esophagitis GERD- will try to get off PPiI given info for taper and zantac sent in  Future Appointments Date Time Provider [place]  10/17/2015 10:45 AM Vicie Mutters, PA-C [facility] None  01/21/2016 2:00 PM Unk Pinto, MD [facility] None      Plan:   During the course of the visit the patient was educated and counseled about appropriate screening and preventive services including:    Pneumococcal vaccine   Influenza vaccine  Td vaccine  Screening electrocardiogram  Screening mammography  Bone densitometry screening  Colorectal cancer screening  Diabetes screening  Glaucoma screening  Nutrition  counseling   Advanced directives: given info/requested  Conditions/risks identified: Diabetes is at goal, ACE/ARB therapy: No, Reason not on Ace Inhibitor/ARB therapy:  only preDM Urinary Incontinence is not an issue: discussed non pharmacology and pharmacology options.  Fall risk: low- discussed PT, home fall assessment, medications.    Subjective:   [name] Park is a 65 y.o. male who presents for medicare visit and follow up for BP and 6 month follow up on hypertension, prediabetes, hyperlipidemia, vitamin D def.    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  He does workout. He denies chest pain, shortness of breath, dizziness.  He is on cholesterol medication, lipitor 80mg  1/2 pill every other day and denies myalgias. His cholesterol is at goal. The cholesterol last visit was:   Lab Results  Component Value Date   CHOL 144 07/19/2015   HDL 61 07/19/2015   LDLCALC 67 07/19/2015   TRIG 82 07/19/2015   CHOLHDL 2.4 07/19/2015  He has OAB and is on vesicare which helps.  He is on wellbutrin which helps with his anxiety.  He use to smoke, quit over 11 years ago, does have diagnosis of COPD via CXR in 2013 but denies any SOB and has not had any exacerbations.  He has been working on diet and exercise for prediabetes, and denies paresthesia of the feet, polydipsia, polyuria and visual disturbances. Last A1C in the office was:  Lab Results  Component Value Date   HGBA1C 5.8* 07/19/2015  Patient is on Vitamin D supplement. Lab Results  Component Value Date   VD25OH 60 07/19/2015   BMI is Body  mass index is 28.59 kg/(m^2)., he is working on diet and exercise. Wt Readings from Last 3 Encounters:  09/06/15 188 lb (85.276 kg)  08/07/15 185 lb 6.4 oz (84.097 kg)  07/19/15 185 lb 12.8 oz (84.278 kg)    Medication Review Current  Outpatient Prescriptions on File Prior to Visit  Medication Sig Dispense Refill  . aspirin 81 MG tablet Take 81 mg by mouth daily.    Marland Kitchen atorvastatin (LIPITOR) 40 MG tablet Take 1 tablet (40 mg total) by mouth daily. 90 tablet 3  . bisoprolol-hydrochlorothiazide (ZIAC) 5-6.25 MG tablet Take 1 tablet every morning for BP 90 tablet 1  . buPROPion (WELLBUTRIN XL) 150 MG 24 hr tablet Take 1 tablet by mouth  daily 90 tablet 1  . Cholecalciferol (VITAMIN D-3) 1000 UNITS CAPS Take 2,000 Units by mouth daily.    . Multiple Vitamin (MULTIVITAMIN) tablet Take 1 tablet by mouth daily.    . ranitidine (ZANTAC) 150 MG tablet Take 1 tablet (150 mg total) by mouth 2 (two) times daily. 180 tablet 3  . solifenacin (VESICARE) 10 MG tablet Take 1 tablet (10 mg total) by mouth daily. 90 tablet 3   No current facility-administered medications on file prior to visit.    Current Problems (verified) Patient Active Problem List   Diagnosis Date Noted  . COPD (chronic obstructive pulmonary disease) with chronic bronchitis ([place]) 09/07/2014  . Generalized anxiety disorder 09/07/2014  . Hyperlipidemia 06/08/2014  . GERD  03/08/2014  . OAB (overactive bladder) 03/08/2014  . Diverticulitis of colon without hemorrhage 03/08/2014  . Essential hypertension 12/05/2013  . Prediabetes 12/05/2013  . Medication management 12/05/2013  . Vitamin D deficiency 06/21/2013    Screening Tests Immunization History  Administered Date(s) Administered  . Influenza Whole 04/07/2013  . Influenza, High Dose Seasonal PF 04/13/2014, 03/27/2015  . PPD Test 12/05/2013  . Pneumococcal Conjugate-13 06/08/2014  . Pneumococcal Polysaccharide-23 09/07/2014  . Tdap 04/29/2011  . Zoster 06/12/2014    Preventative care:  Last colonoscopy: 2007 due this year, has set up with Dr. Fuller Plan.  EGD: 01/2013 DEXA: N/A CXR 09/02/2013  Prior vaccinations: TD or Tdap: 2012  Influenza: 2016 Pneumococcal: 2016 Prevnar13: 2015 Shingles/Zostavax:  2015  Names of Other Physician/Practitioners you currently use: 1. [facility]- here for primary care 2. Dr. Katy Apo, eye doctor, last visit 10/2013, yearly, + cataracts 3. Dr. Quillian Quince, dentist, last visit 6 months ago, has appt this month.  Patient Care Team: Unk Pinto, MD as PCP - General (Internal Medicine) Katy Apo, MD as Consulting Physician (Ophthalmology) Ladene Artist, MD as Consulting Physician (Gastroenterology) Eula Listen, DDS as Referring Physician (Dentistry) Druscilla Brownie, MD as Consulting Physician (Dermatology)  Past Surgical History  Procedure Laterality Date  . Cervical fusion    . Trigger finger release Right     thumb  . Carpal tunnel release Right   . Hand surgery Left    Family History  Problem Relation Age of Onset  . Irritable bowel syndrome Mother   . Diverticulosis Mother   . Lupus Mother   . COPD Mother   . Hypertension Mother   . Lung cancer Father   . Other Father     bladder polyps  . Stroke Father   . Kidney disease Father   . Cancer Father    Social History  Substance Use Topics  . Smoking status: Former Smoker -- 1.50 packs/day for 35 years    Types: Cigarettes    Quit date:  02/05/2004  . Smokeless tobacco: Never Used  . Alcohol Use: No    MEDICARE WELLNESS OBJECTIVES: Tobacco use: He does not smoke.  Patient is a former smoker. Alcohol Current alcohol use: none Caffeine Current caffeine use: denies use Osteoporosis: dietary calcium and/or vitamin D deficiency, History of fracture in the past year: no Diet: in general, a "healthy" diet   Physical activity: walking Depression/mood screen:  Yes - No Depression Hearing: normal Visual acuity: normal,  does perform annual eye exam  ADLs: self care Fall risk: Low Risk Home safety: good Cognitive Testing  Alert? Yes  Normal Appearance?Yes  Oriented to person? Yes  Place? Yes   Time? Yes  Recall of three objects?   Yes  Can perform simple calculations? Yes  Displays appropriate judgment?Yes  Can read the correct time from a watch face?Yes EOL planning: Yes   Objective:   Blood pressure 122/66, pulse 72, temperature 97.3 F (36.3 C), resp. rate 16, height 5\' 8"  (1.727 m), weight 188 lb (85.276 kg). Body mass index is 28.59 kg/(m^2).  General appearance: alert, no distress, WD/WN,  male HEENT: normocephalic, sclerae anicteric, TMs pearly, nares patent, no discharge or erythema, pharynx normal Oral cavity: MMM, no lesions Neck: supple, no lymphadenopathy, no thyromegaly, no masses Heart: RRR, normal S1, S2, no murmurs Lungs: CTA bilaterally, no wheezes, rhonchi, or rales Abdomen: +bs, soft, non tender, non distended, no masses, no hepatomegaly, no splenomegaly Musculoskeletal: nontender, no swelling, no obvious deformity Extremities: no edema, no cyanosis, no clubbing Pulses: 2+ symmetric, upper and lower extremities, normal cap refill Neurological: alert, oriented x 3, CN2-12 intact, strength normal upper extremities and lower extremities, sensation normal throughout, DTRs 2+ throughout, no cerebellar signs, gait normal Psychiatric: normal affect, behavior normal, pleasant  Rectal: defer   Medicare Attestation I have personally reviewed: The patient's medical and social history Their use of alcohol, tobacco or illicit drugs Their current medications and supplements The patient's functional ability including ADLs,fall risks, home safety risks, cognitive, and hearing and visual impairment Diet and physical activities Evidence for depression or mood disorders  The patient's weight, height, BMI, and visual acuity have been recorded in the chart.  I have made referrals, counseling, and provided education to the patient based on review of the above and I have provided the patient with a written personalized care plan for preventive services.     Vicie Mutters, PA-C   09/06/2015

## 2015-09-06 NOTE — Patient Instructions (Signed)
Please pick one of the over the counter allergy medications below and take it once daily for allergies.  Claritin or loratadine cheapest but likely the weakest  Zyrtec or certizine at night because it can make you sleepy The strongest is allegra or fexafinadine  Cheapest at [employer], [employer], [employer]   Monitor your blood pressure at home. Go to the ER if any CP, SOB, nausea, dizziness, severe HA, changes vision/speech  Goal BP:  For patients younger than 60: Goal BP < 140/90. For patients 60 and older: Goal BP < 150/90. For patients with diabetes: Goal BP < 140/90. Your most recent BP: BP: 122/66 mmHg   Take your medications faithfully as instructed. Maintain a healthy weight. Get at least 150 minutes of aerobic exercise per week. Minimize salt intake. Minimize alcohol intake  DASH Eating Plan DASH stands for "Dietary Approaches to Stop Hypertension." The DASH eating plan is a healthy eating plan that has been shown to reduce high blood pressure (hypertension). Additional health benefits may include reducing the risk of type 2 diabetes mellitus, heart disease, and stroke. The DASH eating plan may also help with weight loss. WHAT DO I NEED TO KNOW ABOUT THE DASH EATING PLAN? For the DASH eating plan, you will follow these general guidelines:  Choose foods with a percent daily value for sodium of less than 5% (as listed on the food label).  Use salt-free seasonings or herbs instead of table salt or sea salt.  Check with your health care provider or pharmacist before using salt substitutes.  Eat lower-sodium products, often labeled as "lower sodium" or "no salt added."  Eat fresh foods.  Eat more vegetables, fruits, and low-fat dairy products.  Choose whole grains. Look for the word "whole" as the first word in the ingredient list.  Choose fish and skinless chicken or [place] more often than red meat. Limit fish, poultry, and meat to 6 oz (170 g) each day.  Limit sweets, desserts,  sugars, and sugary drinks.  Choose heart-healthy fats.  Limit cheese to 1 oz (28 g) per day.  Eat more home-cooked food and less restaurant, buffet, and fast food.  Limit fried foods.  Cook foods using methods other than frying.  Limit canned vegetables. If you do use them, rinse them well to decrease the sodium.  When eating at a restaurant, ask that your food be prepared with less salt, or no salt if possible. WHAT FOODS CAN I EAT? Seek help from a dietitian for individual calorie needs. Grains Whole grain or whole wheat bread. Brown rice. Whole grain or whole wheat pasta. Quinoa, bulgur, and whole grain cereals. Low-sodium cereals. Corn or whole wheat flour tortillas. Whole grain cornbread. Whole grain crackers. Low-sodium crackers. Vegetables Fresh or frozen vegetables (raw, steamed, roasted, or grilled). Low-sodium or reduced-sodium tomato and vegetable juices. Low-sodium or reduced-sodium tomato sauce and paste. Low-sodium or reduced-sodium canned vegetables.  Fruits All fresh, canned (in natural juice), or frozen fruits. Meat and Other Protein Products Ground beef (85% or leaner), grass-fed beef, or beef trimmed of fat. Skinless chicken or [place]. Ground chicken or [place]. Pork trimmed of fat. All fish and seafood. Eggs. Dried beans, peas, or lentils. Unsalted nuts and seeds. Unsalted canned beans. Dairy Low-fat dairy products, such as skim or 1% milk, 2% or reduced-fat cheeses, low-fat ricotta or cottage cheese, or plain low-fat yogurt. Low-sodium or reduced-sodium cheeses. Fats and Oils Tub margarines without trans fats. Light or reduced-fat mayonnaise and salad dressings (reduced sodium). Avocado. Safflower, olive, or  canola oils. Natural peanut or almond butter. Other Unsalted popcorn and pretzels. The items listed above may not be a complete list of recommended foods or beverages. Contact your dietitian for more options. WHAT FOODS ARE NOT RECOMMENDED? Grains White  bread. White pasta. White rice. Refined cornbread. Bagels and croissants. Crackers that contain trans fat. Vegetables Creamed or fried vegetables. Vegetables in a cheese sauce. Regular canned vegetables. Regular canned tomato sauce and paste. Regular tomato and vegetable juices. Fruits Dried fruits. Canned fruit in light or heavy syrup. Fruit juice. Meat and Other Protein Products Fatty cuts of meat. Ribs, chicken wings, bacon, sausage, bologna, salami, chitterlings, fatback, hot dogs, bratwurst, and packaged luncheon meats. Salted nuts and seeds. Canned beans with salt. Dairy Whole or 2% milk, cream, half-and-half, and cream cheese. Whole-fat or sweetened yogurt. Full-fat cheeses or blue cheese. Nondairy creamers and whipped toppings. Processed cheese, cheese spreads, or cheese curds. Condiments Onion and garlic salt, seasoned salt, table salt, and sea salt. Canned and packaged gravies. Worcestershire sauce. Tartar sauce. Barbecue sauce. Teriyaki sauce. Soy sauce, including reduced sodium. Steak sauce. Fish sauce. Oyster sauce. Cocktail sauce. Horseradish. Ketchup and mustard. Meat flavorings and tenderizers. Bouillon cubes. Hot sauce. Tabasco sauce. Marinades. Taco seasonings. Relishes. Fats and Oils Butter, stick margarine, lard, shortening, ghee, and bacon fat. Coconut, palm kernel, or palm oils. Regular salad dressings. Other Pickles and olives. Salted popcorn and pretzels. The items listed above may not be a complete list of foods and beverages to avoid. Contact your dietitian for more information. WHERE CAN I FIND MORE INFORMATION? National Heart, Lung, and Blood Institute: travelstabloid.com Document Released: 05/29/2011 Document Revised: 10/24/2013 Document Reviewed: 04/13/2013 [facility] Patient Information 2015 [place], [place]. This information is not intended to replace advice given to you by your health care provider. Make sure you discuss any questions  you have with your health care provider.

## 2015-09-26 DIAGNOSIS — D1801 Hemangioma of skin and subcutaneous tissue: Secondary | ICD-10-CM | POA: Diagnosis not present

## 2015-09-26 DIAGNOSIS — L820 Inflamed seborrheic keratosis: Secondary | ICD-10-CM | POA: Diagnosis not present

## 2015-10-14 ENCOUNTER — Other Ambulatory Visit: Payer: Self-pay | Admitting: Internal Medicine

## 2015-10-17 ENCOUNTER — Ambulatory Visit: Payer: Self-pay | Admitting: Physician Assistant

## 2015-10-29 ENCOUNTER — Ambulatory Visit (INDEPENDENT_AMBULATORY_CARE_PROVIDER_SITE_OTHER): Payer: Medicare Other | Admitting: Physician Assistant

## 2015-10-29 ENCOUNTER — Encounter: Payer: Self-pay | Admitting: Physician Assistant

## 2015-10-29 VITALS — BP 120/84 | HR 79 | Temp 97.5°F | Resp 16 | Ht 68.0 in | Wt 185.4 lb

## 2015-10-29 DIAGNOSIS — E785 Hyperlipidemia, unspecified: Secondary | ICD-10-CM

## 2015-10-29 DIAGNOSIS — I10 Essential (primary) hypertension: Secondary | ICD-10-CM | POA: Diagnosis not present

## 2015-10-29 DIAGNOSIS — R7303 Prediabetes: Secondary | ICD-10-CM | POA: Diagnosis not present

## 2015-10-29 DIAGNOSIS — E559 Vitamin D deficiency, unspecified: Secondary | ICD-10-CM

## 2015-10-29 DIAGNOSIS — J449 Chronic obstructive pulmonary disease, unspecified: Secondary | ICD-10-CM | POA: Diagnosis not present

## 2015-10-29 DIAGNOSIS — Z79899 Other long term (current) drug therapy: Secondary | ICD-10-CM | POA: Diagnosis not present

## 2015-10-29 DIAGNOSIS — R7309 Other abnormal glucose: Secondary | ICD-10-CM | POA: Diagnosis not present

## 2015-10-29 LAB — BASIC METABOLIC PANEL WITH GFR
BUN: 18 mg/dL (ref 7–25)
CALCIUM: 9.7 mg/dL (ref 8.6–10.3)
CHLORIDE: 103 mmol/L (ref 98–110)
CO2: 29 mmol/L (ref 20–31)
CREATININE: 1.41 mg/dL — AB (ref 0.70–1.25)
GFR, Est African American: 60 mL/min (ref >=60)
GFR, Est Non African American: 52 mL/min — ABNORMAL LOW (ref >=60)
Glucose, Bld: 84 mg/dL (ref 65–99)
Potassium: 5 mmol/L (ref 3.5–5.3)
Sodium: 142 mmol/L (ref 135–146)

## 2015-10-29 LAB — HEPATIC FUNCTION PANEL
ALBUMIN: 4 g/dL (ref 3.6–5.1)
ALT: 27 U/L (ref 9–46)
AST: 24 U/L (ref 10–35)
Alkaline Phosphatase: 83 U/L (ref 40–115)
BILIRUBIN TOTAL: 0.6 mg/dL (ref 0.2–1.2)
Bilirubin, Direct: 0.1 mg/dL (ref <=0.2)
Indirect Bilirubin: 0.5 mg/dL (ref 0.2–1.2)
Total Protein: 6.9 g/dL (ref 6.1–8.1)

## 2015-10-29 LAB — CBC WITH DIFFERENTIAL/PLATELET
BASOS ABS: 56 {cells}/uL (ref 0–200)
Basophils Relative: 1 %
EOS ABS: 168 {cells}/uL (ref 15–500)
EOS PCT: 3 %
HCT: 40 % (ref 38.5–50.0)
Hemoglobin: 13.6 g/dL (ref 13.2–17.1)
LYMPHS PCT: 25 %
Lymphs Abs: 1400 cells/uL (ref 850–3900)
MCH: 30.7 pg (ref 27.0–33.0)
MCHC: 34 g/dL (ref 32.0–36.0)
MCV: 90.3 fL (ref 80.0–100.0)
MONOS PCT: 11 %
MPV: 10.5 fL (ref 7.5–12.5)
Monocytes Absolute: 616 cells/uL (ref 200–950)
NEUTROS PCT: 60 %
Neutro Abs: 3360 cells/uL (ref 1500–7800)
PLATELETS: 275 10*3/uL (ref 140–400)
RBC: 4.43 MIL/uL (ref 4.20–5.80)
RDW: 13.7 % (ref 11.0–15.0)
WBC: 5.6 10*3/uL (ref 3.8–10.8)

## 2015-10-29 LAB — HEMOGLOBIN A1C
HEMOGLOBIN A1C: 5.6 % (ref <5.7)
MEAN PLASMA GLUCOSE: 114 mg/dL

## 2015-10-29 LAB — LIPID PANEL
CHOLESTEROL: 128 mg/dL (ref 125–200)
HDL: 53 mg/dL (ref >=40)
LDL Cholesterol: 57 mg/dL (ref <130)
Total CHOL/HDL Ratio: 2.4 Ratio (ref <=5.0)
Triglycerides: 91 mg/dL (ref <150)
VLDL: 18 mg/dL (ref <30)

## 2015-10-29 LAB — MAGNESIUM: Magnesium: 2.3 mg/dL (ref 1.5–2.5)

## 2015-10-29 NOTE — Progress Notes (Signed)
Patient ID: [name] Park, male   DOB: 1950/08/06, 65 y.o.   MRN: [id number]  Assessment and Plan:  Hypertension:  -Continue medication,  -monitor blood pressure at home.  -Continue DASH diet.   -Reminder to go to the ER if any CP, SOB, nausea, dizziness, severe HA, changes vision/speech, left arm numbness and tingling, and jaw pain.  Cholesterol: -Continue diet and exercise.  -Check cholesterol.   Pre-diabetes: -Continue diet and exercise.  -Check A1C  Vitamin D Def: -check level -continue medications.    Continue diet and meds as discussed. Further disposition pending results of labs.  HPI 65 y.o. male  presents for 3 month follow up with hypertension, hyperlipidemia, prediabetes and vitamin D.   His blood pressure has been controlled at home, today their BP is BP: 120/84 mmHg.   He does workout. He denies chest pain, shortness of breath, dizziness.   He is on cholesterol medication and denies myalgias. His cholesterol is at goal. The cholesterol last visit was:   Lab Results  Component Value Date   CHOL 144 07/19/2015   HDL 61 07/19/2015   LDLCALC 67 07/19/2015   TRIG 82 07/19/2015   CHOLHDL 2.4 07/19/2015    He has been working on diet and exercise for prediabetes, and denies foot ulcerations, hyperglycemia, hypoglycemia , increased appetite, nausea, paresthesia of the feet, polydipsia, polyuria, visual disturbances, vomiting and weight loss. Last A1C in the office was:  Lab Results  Component Value Date   HGBA1C 5.8* 07/19/2015   Patient is on Vitamin D supplement.  Lab Results  Component Value Date   VD25OH 60 07/19/2015     Had vertigo 3 weeks ago, with vomiting, no changes in vision/hearing, last for 1 day, started in the AM, worse with lying down/turning head, has history of meclizine. Took dramamine.   Current Medications:  Current Outpatient Prescriptions on File Prior to Visit  Medication Sig Dispense Refill  . aspirin 81 MG tablet Take 81 mg by mouth  daily.    Marland Kitchen atorvastatin (LIPITOR) 40 MG tablet Take 1 tablet by mouth  daily 90 tablet 0  . bisoprolol-hydrochlorothiazide (ZIAC) 5-6.25 MG tablet Take 1 tablet every morning for BP 90 tablet 1  . buPROPion (WELLBUTRIN XL) 150 MG 24 hr tablet Take 1 tablet by mouth  daily 90 tablet 1  . Cholecalciferol (VITAMIN D-3) 1000 UNITS CAPS Take 2,000 Units by mouth daily.    . Multiple Vitamin (MULTIVITAMIN) tablet Take 1 tablet by mouth daily.    . ranitidine (ZANTAC) 150 MG tablet Take 1 tablet (150 mg total) by mouth 2 (two) times daily. 180 tablet 3  . solifenacin (VESICARE) 10 MG tablet Take 1 tablet (10 mg total) by mouth daily. 90 tablet 3   No current facility-administered medications on file prior to visit.    Medical History:  Past Medical History  Diagnosis Date  . COPD (chronic obstructive pulmonary disease) ([place])   . GERD (gastroesophageal reflux disease)   . HLD (hyperlipidemia)   . IBS (irritable bowel syndrome)   . Hypertension   . Pre-diabetes   . Vitamin D deficiency   . Hematuria     Allergies: No Known Allergies   Review of Systems:  Review of Systems  Constitutional: Negative for fever, chills and malaise/fatigue.  HENT: Negative for congestion, ear pain and sore throat.   Respiratory: Negative for cough, shortness of breath and wheezing.   Cardiovascular: Negative for chest pain, palpitations and leg swelling.  Gastrointestinal: Negative for heartburn,  diarrhea, constipation, blood in stool and melena.  Genitourinary: Negative.   Musculoskeletal: Positive for myalgias.  Skin: Negative.   Neurological: Negative for dizziness, sensory change, loss of consciousness and headaches.  Psychiatric/Behavioral: Negative for depression. The patient is not nervous/anxious and does not have insomnia.     Family history- Review and unchanged  Social history- Review and unchanged  Physical Exam: BP 120/84 mmHg  Pulse 79  Temp(Src) 97.5 F (36.4 C) (Temporal)  Resp 16   Ht 5\' 8"  (1.727 m)  Wt 185 lb 6.4 oz (84.097 kg)  BMI 28.20 kg/m2  SpO2 97% Wt Readings from Last 3 Encounters:  10/29/15 185 lb 6.4 oz (84.097 kg)  09/06/15 188 lb (85.276 kg)  08/07/15 185 lb 6.4 oz (84.097 kg)    General Appearance: Well nourished well developed, in no apparent distress. Eyes: PERRLA, EOMs, conjunctiva no swelling or erythema ENT/Mouth: Ear canals normal without obstruction, swelling, erythma, discharge.  TMs normal bilaterally.  Oropharynx moist, clear, without exudate, or postoropharyngeal swelling. Neck: Supple, thyroid normal,no cervical adenopathy  Respiratory: Respiratory effort normal, Breath sounds clear A&P without rhonchi, wheeze, or rale.  No retractions, no accessory usage. Cardio: RRR with no MRGs. Brisk peripheral pulses without edema.  Abdomen: Soft, + BS,  Non tender, no guarding, rebound, hernias, masses. Musculoskeletal: Full ROM, 5/5 strength, Normal gait Skin: Warm, dry without rashes, lesions, ecchymosis.  Neuro: Awake and oriented X 3, Cranial nerves intact. Normal muscle tone, no cerebellar symptoms. Psych: Normal affect, Insight and Judgment appropriate.    Vicie Mutters, PA-C 11:27 AM [facility]

## 2015-10-30 LAB — TSH: TSH: 0.72 m[IU]/L (ref 0.40–4.50)

## 2015-10-30 LAB — VITAMIN D 25 HYDROXY (VIT D DEFICIENCY, FRACTURES): VIT D 25 HYDROXY: 67 ng/mL (ref 30–100)

## 2015-12-27 ENCOUNTER — Encounter: Payer: Self-pay | Admitting: Gastroenterology

## 2016-01-11 ENCOUNTER — Other Ambulatory Visit: Payer: Self-pay | Admitting: Internal Medicine

## 2016-01-15 ENCOUNTER — Encounter: Payer: Self-pay | Admitting: Internal Medicine

## 2016-01-21 ENCOUNTER — Encounter: Payer: Self-pay | Admitting: Internal Medicine

## 2016-01-22 ENCOUNTER — Other Ambulatory Visit: Payer: Self-pay | Admitting: *Deleted

## 2016-01-22 DIAGNOSIS — I10 Essential (primary) hypertension: Secondary | ICD-10-CM

## 2016-01-22 MED ORDER — BISOPROLOL-HYDROCHLOROTHIAZIDE 5-6.25 MG PO TABS: ORAL_TABLET | ORAL | 1 refills | Status: DC

## 2016-02-05 ENCOUNTER — Ambulatory Visit (INDEPENDENT_AMBULATORY_CARE_PROVIDER_SITE_OTHER): Payer: Medicare Other | Admitting: Internal Medicine

## 2016-02-05 ENCOUNTER — Encounter: Payer: Self-pay | Admitting: Internal Medicine

## 2016-02-05 VITALS — BP 114/64 | HR 72 | Temp 97.3°F | Resp 16 | Ht 68.0 in | Wt 188.6 lb

## 2016-02-05 DIAGNOSIS — I10 Essential (primary) hypertension: Secondary | ICD-10-CM | POA: Diagnosis not present

## 2016-02-05 DIAGNOSIS — Z87891 Personal history of nicotine dependence: Secondary | ICD-10-CM

## 2016-02-05 DIAGNOSIS — K219 Gastro-esophageal reflux disease without esophagitis: Secondary | ICD-10-CM

## 2016-02-05 DIAGNOSIS — J449 Chronic obstructive pulmonary disease, unspecified: Secondary | ICD-10-CM

## 2016-02-05 DIAGNOSIS — R7303 Prediabetes: Secondary | ICD-10-CM

## 2016-02-05 DIAGNOSIS — E559 Vitamin D deficiency, unspecified: Secondary | ICD-10-CM | POA: Diagnosis not present

## 2016-02-05 DIAGNOSIS — Z125 Encounter for screening for malignant neoplasm of prostate: Secondary | ICD-10-CM | POA: Diagnosis not present

## 2016-02-05 DIAGNOSIS — R6889 Other general symptoms and signs: Secondary | ICD-10-CM

## 2016-02-05 DIAGNOSIS — Z79899 Other long term (current) drug therapy: Secondary | ICD-10-CM

## 2016-02-05 DIAGNOSIS — Z0001 Encounter for general adult medical examination with abnormal findings: Secondary | ICD-10-CM | POA: Diagnosis not present

## 2016-02-05 DIAGNOSIS — Z136 Encounter for screening for cardiovascular disorders: Secondary | ICD-10-CM

## 2016-02-05 DIAGNOSIS — E785 Hyperlipidemia, unspecified: Secondary | ICD-10-CM

## 2016-02-05 DIAGNOSIS — Z1211 Encounter for screening for malignant neoplasm of colon: Secondary | ICD-10-CM

## 2016-02-05 LAB — CBC WITH DIFFERENTIAL/PLATELET
Basophils Absolute: 0 cells/uL (ref 0–200)
Basophils Relative: 0 %
EOS ABS: 201 {cells}/uL (ref 15–500)
Eosinophils Relative: 3 %
HEMATOCRIT: 40.1 % (ref 38.5–50.0)
Hemoglobin: 13.8 g/dL (ref 13.2–17.1)
LYMPHS PCT: 24 %
Lymphs Abs: 1608 cells/uL (ref 850–3900)
MCH: 30.9 pg (ref 27.0–33.0)
MCHC: 34.4 g/dL (ref 32.0–36.0)
MCV: 89.7 fL (ref 80.0–100.0)
MONO ABS: 737 {cells}/uL (ref 200–950)
MPV: 10.6 fL (ref 7.5–12.5)
Monocytes Relative: 11 %
NEUTROS PCT: 62 %
Neutro Abs: 4154 cells/uL (ref 1500–7800)
PLATELETS: 276 10*3/uL (ref 140–400)
RBC: 4.47 MIL/uL (ref 4.20–5.80)
RDW: 13.4 % (ref 11.0–15.0)
WBC: 6.7 10*3/uL (ref 3.8–10.8)

## 2016-02-05 LAB — LIPID PANEL
Cholesterol: 137 mg/dL (ref 125–200)
HDL: 58 mg/dL (ref >=40)
LDL CALC: 59 mg/dL (ref <130)
Total CHOL/HDL Ratio: 2.4 Ratio (ref <=5.0)
Triglycerides: 99 mg/dL (ref <150)
VLDL: 20 mg/dL (ref <30)

## 2016-02-05 LAB — HEPATIC FUNCTION PANEL
ALBUMIN: 4.4 g/dL (ref 3.6–5.1)
ALT: 25 U/L (ref 9–46)
AST: 24 U/L (ref 10–35)
Alkaline Phosphatase: 83 U/L (ref 40–115)
BILIRUBIN TOTAL: 0.5 mg/dL (ref 0.2–1.2)
Bilirubin, Direct: 0.1 mg/dL (ref <=0.2)
Indirect Bilirubin: 0.4 mg/dL (ref 0.2–1.2)
TOTAL PROTEIN: 6.8 g/dL (ref 6.1–8.1)

## 2016-02-05 LAB — BASIC METABOLIC PANEL WITH GFR
BUN: 18 mg/dL (ref 7–25)
CHLORIDE: 104 mmol/L (ref 98–110)
CO2: 28 mmol/L (ref 20–31)
CREATININE: 1.44 mg/dL — AB (ref 0.70–1.25)
Calcium: 9.4 mg/dL (ref 8.6–10.3)
GFR, EST AFRICAN AMERICAN: 58 mL/min — AB (ref >=60)
GFR, Est Non African American: 50 mL/min — ABNORMAL LOW (ref >=60)
GLUCOSE: 112 mg/dL — AB (ref 65–99)
Potassium: 4.7 mmol/L (ref 3.5–5.3)
Sodium: 139 mmol/L (ref 135–146)

## 2016-02-05 LAB — MAGNESIUM: MAGNESIUM: 2.2 mg/dL (ref 1.5–2.5)

## 2016-02-05 LAB — PSA: PSA: 0.8 ng/mL (ref <=4.0)

## 2016-02-05 LAB — TSH: TSH: 0.87 m[IU]/L (ref 0.40–4.50)

## 2016-02-05 NOTE — Progress Notes (Signed)
[facility]   Unk Pinto, M.D.    Uvaldo Bristle. Silverio Lay, P.A.-C      Starlyn Skeans, P.A.-C   [facility]                [address] [place] [id number] Telephone [phone] Telefax [phone]  Annual  Screening/Preventative Visit And Comprehensive Evaluation & Examination     This very nice 65 y.o. MWM presents for a Wellness/Preventative Visit & comprehensive evaluation and management of multiple medical co-morbidities.  Patient has been followed for HTN, Prediabetes, Hyperlipidemia and Vitamin D Deficiency. Patient also has GERD controlled on Ranitidine with very infrequent heartburn from dietary indiscretions.     HTN predates since 2012. Patient's BP has been controlled at home.Today's BP: 114/64. Patient denies any cardiac symptoms as chest pain, palpitations, shortness of breath, dizziness or ankle swelling.    Patient's hyperlipidemia is controlled with diet and medications. Patient denies myalgias or other medication SE's. Last lipids were at goal: Lab Results  Component Value Date   CHOL 137 02/05/2016   HDL 58 02/05/2016   LDLCALC 59 02/05/2016   TRIG 99 02/05/2016   CHOLHDL 2.4 02/05/2016      Patient has prediabetes since 2011 and patient denies reactive hypoglycemic symptoms, visual blurring, diabetic polys or paresthesias. Last A1c was at goal: Lab Results  Component Value Date   HGBA1C 5.6 10/29/2015       Finally, patient has history of Vitamin D Deficiency of "31"in 2013 and last vitamin D was at goal: Lab Results  Component Value Date   VD25OH 67 10/29/2015   Current Outpatient Prescriptions on File Prior to Visit  Medication Sig  . aspirin 81 MG tablet Take 81 mg by mouth daily.  Marland Kitchen atorvastatin (LIPITOR) 40 MG tablet Take 1 tablet by mouth  daily  . bisoprolol-hydrochlorothiazide (ZIAC) 5-6.25 MG tablet Take 1 tablet every morning for BP  . buPROPion  (WELLBUTRIN XL) 150 MG 24 hr tablet Take 1 tablet by mouth  daily  . Cholecalciferol (VITAMIN D-3) 1000 UNITS CAPS Take 2,000 Units by mouth daily.  . Multiple Vitamin (MULTIVITAMIN) tablet Take 1 tablet by mouth daily.  . ranitidine (ZANTAC) 150 MG tablet Take 1 tablet (150 mg total) by mouth 2 (two) times daily.  . solifenacin (VESICARE) 10 MG tablet Take 1 tablet (10 mg total) by mouth daily.   No current facility-administered medications on file prior to visit.    No Known Allergies Past Medical History:  Diagnosis Date  . COPD (chronic obstructive pulmonary disease) ([place])   . GERD (gastroesophageal reflux disease)   . Hematuria   . HLD (hyperlipidemia)   . Hypertension   . IBS (irritable bowel syndrome)   . Pre-diabetes   . Vitamin D deficiency    Health Maintenance  Topic Date Due  . COLONOSCOPY  11/14/2015  . INFLUENZA VACCINE  01/22/2016  . TETANUS/TDAP  04/28/2021  . ZOSTAVAX  Completed  . Hepatitis C Screening  Completed  . PNA vac Low Risk Adult  Completed   Immunization History  Administered Date(s) Administered  . Influenza Whole 04/07/2013  . Influenza, High Dose Seasonal PF 04/13/2014, 03/27/2015  . PPD Test 12/05/2013  . Pneumococcal Conjugate-13 06/08/2014  . Pneumococcal Polysaccharide-23 09/07/2014  . Tdap 04/29/2011  . Zoster 06/12/2014   Past Surgical History:  Procedure Laterality Date  . CARPAL TUNNEL RELEASE  Right   . CERVICAL FUSION    . HAND SURGERY Left   . TRIGGER FINGER RELEASE Right    thumb   Family History  Problem Relation Age of Onset  . Irritable bowel syndrome Mother   . Diverticulosis Mother   . Lupus Mother   . COPD Mother   . Hypertension Mother   . Lung cancer Father   . Other Father     bladder polyps  . Stroke Father   . Kidney disease Father   . Cancer Father    Social History   Social History  . Marital status: Married    Spouse name: N/A  . Number of children: 1  . Years of education: N/A   Occupational  History  . retired    Social History Main Topics  . Smoking status: Former Smoker    Packs/day: 1.50    Years: 35.00    Types: Cigarettes    Quit date: 02/05/2004  . Smokeless tobacco: Never Used  . Alcohol use No  . Drug use: No  . Sexual activity: Not on file    ROS Constitutional: Denies fever, chills, weight loss/gain, headaches, insomnia,  night sweats or change in appetite. Does c/o fatigue. Eyes: Denies redness, blurred vision, diplopia, discharge, itchy or watery eyes.  ENT: Denies discharge, congestion, post nasal drip, epistaxis, sore throat, earache, hearing loss, dental pain, Tinnitus, Vertigo, Sinus pain or snoring.  Cardio: Denies chest pain, palpitations, irregular heartbeat, syncope, dyspnea, diaphoresis, orthopnea, PND, claudication or edema Respiratory: denies cough, dyspnea, DOE, pleurisy, hoarseness, laryngitis or wheezing.  Gastrointestinal: Denies dysphagia, heartburn, reflux, water brash, pain, cramps, nausea, vomiting, bloating, diarrhea, constipation, hematemesis, melena, hematochezia, jaundice or hemorrhoids Genitourinary: Denies dysuria, frequency, urgency, nocturia, hesitancy, discharge, hematuria or flank pain Musculoskeletal: Denies arthralgia, myalgia, stiffness, Jt. Swelling, pain, limp or strain/sprain. Denies Falls. Skin: Denies puritis, rash, hives, warts, acne, eczema or change in skin lesion Neuro: No weakness, tremor, incoordination, spasms, paresthesia or pain Psychiatric: Denies confusion, memory loss or sensory loss. Denies Depression. Endocrine: Denies change in weight, skin, hair change, nocturia, and paresthesia, diabetic polys, visual blurring or hyper / hypo glycemic episodes.  Heme/Lymph: No excessive bleeding, bruising or enlarged lymph nodes.  Physical Exam  BP 114/64   Pulse 72   Temp 97.3 F (36.3 C)   Resp 16   Ht 5\' 8"  (1.727 m)   Wt 188 lb 9.6 oz (85.5 kg)   BMI 28.68 kg/m   General Appearance: Well nourished, in no  apparent distress.  Eyes: PERRLA, EOMs, conjunctiva no swelling or erythema, normal fundi and vessels. Sinuses: No frontal/maxillary tenderness ENT/Mouth: EACs patent / TMs  nl. Nares clear without erythema, swelling, mucoid exudates. Oral hygiene is good. No erythema, swelling, or exudate. Tongue normal, non-obstructing. Tonsils not swollen or erythematous. Hearing normal.  Neck: Supple, thyroid normal. No bruits, nodes or JVD. Respiratory: Respiratory effort normal.  BS equal and clear bilateral without rales, rhonci, wheezing or stridor. Cardio: Heart sounds are normal with regular rate and rhythm and no murmurs, rubs or gallops. Peripheral pulses are normal and equal bilaterally without edema. No aortic or femoral bruits. Chest: symmetric with normal excursions and percussion.  Abdomen: Soft, with Nl bowel sounds. Nontender, no guarding, rebound, hernias, masses, or organomegaly.  Lymphatics: Non tender without lymphadenopathy.  Genitourinary: No hernias.Testes nl. DRE - prostate nl for age - smooth & firm w/o nodules. Musculoskeletal: Full ROM all peripheral extremities, joint stability, 5/5 strength, and normal gait. Skin: Warm and dry  without rashes, lesions, cyanosis, clubbing or  ecchymosis.  Neuro: Cranial nerves intact, reflexes equal bilaterally. Normal muscle tone, no cerebellar symptoms. Sensation intact.  Pysch: Alert and oriented X 3 with normal affect, insight and judgment appropriate.   Assessment and Plan  1. Annual Preventative/Screening Exam   - Microalbumin / creatinine urine ratio - EKG 12-Lead - [place], RETROPERITNL ABD,  LTD - POC Hemoccult Bld/Stl  - Urinalysis, Routine w reflex microscopic  - PSA - CBC with Differential/Platelet - BASIC METABOLIC PANEL WITH GFR - Hepatic function panel - Magnesium - Lipid panel - TSH - Hemoglobin A1c - Insulin, random - VITAMIN D 25 Hydroxy   2. Essential hypertension  - Microalbumin / creatinine urine ratio - EKG  12-Lead - [place], RETROPERITNL ABD,  LTD - TSH  3. Hyperlipidemia  - EKG 12-Lead - [place], RETROPERITNL ABD,  LTD - Lipid panel - TSH  4. Prediabetes  - EKG 12-Lead - [place], RETROPERITNL ABD,  LTD - Hemoglobin A1c - Insulin, random  5. Vitamin D deficiency  - VITAMIN D 25 Hydroxy   6. Gastroesophageal reflux disease   7. COPD with chronic bronchitis ([place])   8. Colon cancer screening  - POC Hemoccult Bld/Stl   9. Prostate cancer screening  - PSA  10. Screening for ischemic heart disease  - EKG 12-Lead  11. Screening for AAA (aortic abdominal aneurysm)  - [place], RETROPERITNL ABD,  LTD  12. Medication management  - Urinalysis, Routine w reflex microscopic - CBC with Differential/Platelet - BASIC METABOLIC PANEL WITH GFR - Hepatic function panel - Magnesium  13. Ex-smoker  - CT CHEST LUNG CA SCREEN LOW DOSE W/O CM      Continue prudent diet as discussed, weight control, BP monitoring, regular exercise, and medications as discussed.  Discussed med effects and SE's. Routine screening labs and tests as requested with regular follow-up as recommended. Over 40 minutes of exam, counseling, chart review and high complex critical decision making was performed

## 2016-02-05 NOTE — Patient Instructions (Signed)

## 2016-02-06 LAB — URINALYSIS, ROUTINE W REFLEX MICROSCOPIC
BILIRUBIN URINE: NEGATIVE
GLUCOSE, UA: NEGATIVE
HGB URINE DIPSTICK: NEGATIVE
KETONES UR: NEGATIVE
LEUKOCYTES UA: NEGATIVE
NITRITE: NEGATIVE
PH: 6.5 (ref 5.0–8.0)
Protein, ur: NEGATIVE
SPECIFIC GRAVITY, URINE: 1.014 (ref 1.001–1.035)

## 2016-02-06 LAB — HEMOGLOBIN A1C
Hgb A1c MFr Bld: 5.3 % (ref <5.7)
Mean Plasma Glucose: 105 mg/dL

## 2016-02-06 LAB — MICROALBUMIN / CREATININE URINE RATIO
CREATININE, URINE: 119 mg/dL (ref 20–370)
MICROALB UR: 0.6 mg/dL
Microalb Creat Ratio: 5 mcg/mg creat (ref <30)

## 2016-02-06 LAB — INSULIN, RANDOM: INSULIN: 54 u[IU]/mL — AB (ref 2.0–19.6)

## 2016-02-06 LAB — VITAMIN D 25 HYDROXY (VIT D DEFICIENCY, FRACTURES): VIT D 25 HYDROXY: 61 ng/mL (ref 30–100)

## 2016-02-14 ENCOUNTER — Ambulatory Visit (AMBULATORY_SURGERY_CENTER): Payer: Self-pay | Admitting: *Deleted

## 2016-02-14 ENCOUNTER — Encounter: Payer: Self-pay | Admitting: Gastroenterology

## 2016-02-14 VITALS — Ht 68.0 in | Wt 187.8 lb

## 2016-02-14 DIAGNOSIS — Z1211 Encounter for screening for malignant neoplasm of colon: Secondary | ICD-10-CM

## 2016-02-14 MED ORDER — NA SULFATE-K SULFATE-MG SULF 17.5-3.13-1.6 GM/177ML PO SOLN: 1.0000 | Freq: Once | ORAL | 0 refills | Status: AC

## 2016-02-14 NOTE — Progress Notes (Signed)
Denies allergies to eggs or soy products. Denies complications with sedation or anesthesia. Denies O2 use. Denies use of diet or weight loss medications.  Emmi instructions given for colonoscopy.  

## 2016-02-19 ENCOUNTER — Ambulatory Visit
Admission: RE | Admit: 2016-02-19 | Discharge: 2016-02-19 | Disposition: A | Discharge status: Home / Self Care | Payer: Medicare Other | Source: Ambulatory Visit | Attending: Internal Medicine | Admitting: Internal Medicine

## 2016-02-19 DIAGNOSIS — Z87891 Personal history of nicotine dependence: Secondary | ICD-10-CM

## 2016-02-21 ENCOUNTER — Other Ambulatory Visit: Payer: Self-pay | Admitting: Internal Medicine

## 2016-02-28 ENCOUNTER — Ambulatory Visit (AMBULATORY_SURGERY_CENTER): Payer: Medicare Other | Admitting: Gastroenterology

## 2016-02-28 ENCOUNTER — Encounter: Payer: Self-pay | Admitting: Gastroenterology

## 2016-02-28 VITALS — BP 110/63 | HR 70 | Temp 98.0°F | Resp 18 | Ht 68.0 in | Wt 187.0 lb

## 2016-02-28 DIAGNOSIS — Z1211 Encounter for screening for malignant neoplasm of colon: Secondary | ICD-10-CM

## 2016-02-28 MED ORDER — SODIUM CHLORIDE 0.9 % IV SOLN: 500.0000 mL | INTRAVENOUS | Status: DC

## 2016-02-28 NOTE — Op Note (Signed)
[place] Patient Name: [name] Park Procedure Date: 02/28/2016 11:35 AM MRN: [id number] Endoscopist: Ladene Artist , MD Age: 65 Referring MD:  Date of Birth: [birth year] Gender: Male Account #: [id number] Procedure:                Colonoscopy Indications:              Screening for colorectal malignant neoplasm, Last                            colonoscopy 10 years ago Medicines:                Monitored Anesthesia Care Procedure:                Pre-Anesthesia Assessment:                           - Prior to the procedure, a History and Physical                            was performed, and patient medications and                            allergies were reviewed. The patient's tolerance of                            previous anesthesia was also reviewed. The risks                            and benefits of the procedure and the sedation                            options and risks were discussed with the patient.                            All questions were answered, and informed consent                            was obtained. Prior Anticoagulants: The patient has                            taken no previous anticoagulant or antiplatelet                            agents. ASA Grade Assessment: II - A patient with                            mild systemic disease. After reviewing the risks                            and benefits, the patient was deemed in                            satisfactory condition to undergo the procedure.  After obtaining informed consent, the colonoscope                            was passed under direct vision. Throughout the                            procedure, the patient's blood pressure, pulse, and                            oxygen saturations were monitored continuously. The                            Model PCF-H190L [id number]) scope was introduced                            through the anus and advanced to  the the cecum,                            identified by appendiceal orifice and ileocecal                            valve. The ileocecal valve, appendiceal orifice,                            and rectum were photographed. The quality of the                            bowel preparation was excellent. The colonoscopy                            was performed without difficulty. The patient                            tolerated the procedure well. Scope In: 11:52:49 AM Scope Out: 12:05:48 PM Scope Withdrawal Time: 0 hours 11 minutes 17 seconds  Total Procedure Duration: 0 hours 12 minutes 59 seconds  Findings:                 The entire examined colon appeared normal on direct                            and retroflexion views. Complications:            No immediate complications. Estimated blood loss:                            None. Estimated Blood Loss:     Estimated blood loss: none. Impression:               - The entire examined colon is normal on direct and                            retroflexion views.                           - No specimens collected. Recommendation:           -  Repeat colonoscopy in 10 years for screening                            purposes.                           - Patient has a contact number available for                            emergencies. The signs and symptoms of potential                            delayed complications were discussed with the                            patient. Return to normal activities tomorrow.                            Written discharge instructions were provided to the                            patient.                           - Resume previous diet.                           - Continue present medications. Ladene Artist, MD 02/28/2016 12:09:35 PM This report has been signed electronically.

## 2016-02-28 NOTE — Progress Notes (Signed)
Report to PACU, RN, vss, BBS= Clear.  

## 2016-02-28 NOTE — Patient Instructions (Signed)
YOU HAD AN ENDOSCOPIC PROCEDURE TODAY AT THE [facility]:   Refer to the procedure report that was given to you for any specific questions about what was found during the examination.  If the procedure report does not answer your questions, please call your gastroenterologist to clarify.  If you requested that your care partner not be given the details of your procedure findings, then the procedure report has been included in a sealed envelope for you to review at your convenience later.  YOU SHOULD EXPECT: Some feelings of bloating in the abdomen. Passage of more gas than usual.  Walking can help get rid of the air that was put into your GI tract during the procedure and reduce the bloating. If you had a lower endoscopy (such as a colonoscopy or flexible sigmoidoscopy) you may notice spotting of blood in your stool or on the toilet paper. If you underwent a bowel prep for your procedure, you may not have a normal bowel movement for a few days.  Please Note:  You might notice some irritation and congestion in your nose or some drainage.  This is from the oxygen used during your procedure.  There is no need for concern and it should clear up in a day or so.  SYMPTOMS TO REPORT IMMEDIATELY:   Following lower endoscopy (colonoscopy or flexible sigmoidoscopy):  Excessive amounts of blood in the stool  Significant tenderness or worsening of abdominal pains  Swelling of the abdomen that is new, acute  Fever of 100F or higher   Following upper endoscopy (EGD)  Vomiting of blood or coffee ground material  New chest pain or pain under the shoulder blades  Painful or persistently difficult swallowing  New shortness of breath  Fever of 100F or higher  Black, tarry-looking stools  For urgent or emergent issues, a gastroenterologist can be reached at any hour by calling [phone].   DIET:  We do recommend a small meal at first, but then you may proceed to your regular diet.  Drink  plenty of fluids but you should avoid alcoholic beverages for 24 hours.  ACTIVITY:  You should plan to take it easy for the rest of today and you should NOT DRIVE or use heavy machinery until tomorrow (because of the sedation medicines used during the test).    FOLLOW UP: Our staff will call the number listed on your records the next business day following your procedure to check on you and address any questions or concerns that you may have regarding the information given to you following your procedure. If we do not reach you, we will leave a message.  However, if you are feeling well and you are not experiencing any problems, there is no need to return our call.  We will assume that you have returned to your regular daily activities without incident.  If any biopsies were taken you will be contacted by phone or by letter within the next 1-3 weeks.  Please call us at [phone] if you have not heard about the biopsies in 3 weeks.    SIGNATURES/CONFIDENTIALITY: You and/or your care partner have signed paperwork which will be entered into your electronic medical record.  These signatures attest to the fact that that the information above on your After Visit Summary has been reviewed and is understood.  Full responsibility of the confidentiality of this discharge information lies with you and/or your care-partner. 

## 2016-02-29 ENCOUNTER — Telehealth: Payer: Self-pay | Admitting: *Deleted

## 2016-02-29 NOTE — Telephone Encounter (Signed)
  Follow up Call-  Call back number 02/28/2016  Post procedure Call Back phone  # [phone]  Permission to leave phone message Yes  Some recent data might be hidden     Patient questions:  Do you have a fever, pain , or abdominal swelling? No. Pain Score  0 *  Have you tolerated food without any problems? Yes.    Have you been able to return to your normal activities? Yes.    Do you have any questions about your discharge instructions: Diet   No. Medications  No. Follow up visit  No.  Do you have questions or concerns about your Care? Yes.    Actions: * If pain score is 4 or above: No action needed, pain <4.  patiernt with stuffy nose and watery eyes. Discussed discharge instructions and 02 usage.

## 2016-03-25 ENCOUNTER — Ambulatory Visit (INDEPENDENT_AMBULATORY_CARE_PROVIDER_SITE_OTHER): Payer: Medicare Other

## 2016-03-25 DIAGNOSIS — Z23 Encounter for immunization: Secondary | ICD-10-CM

## 2016-04-19 ENCOUNTER — Other Ambulatory Visit: Payer: Self-pay | Admitting: Internal Medicine

## 2016-05-07 ENCOUNTER — Ambulatory Visit (INDEPENDENT_AMBULATORY_CARE_PROVIDER_SITE_OTHER): Payer: Medicare Other | Admitting: Internal Medicine

## 2016-05-07 ENCOUNTER — Encounter: Payer: Self-pay | Admitting: Internal Medicine

## 2016-05-07 VITALS — BP 100/60 | HR 70 | Temp 98.2°F | Resp 16 | Ht 68.0 in | Wt 192.0 lb

## 2016-05-07 DIAGNOSIS — I10 Essential (primary) hypertension: Secondary | ICD-10-CM | POA: Diagnosis not present

## 2016-05-07 DIAGNOSIS — E559 Vitamin D deficiency, unspecified: Secondary | ICD-10-CM | POA: Diagnosis not present

## 2016-05-07 DIAGNOSIS — J449 Chronic obstructive pulmonary disease, unspecified: Secondary | ICD-10-CM

## 2016-05-07 DIAGNOSIS — Z79899 Other long term (current) drug therapy: Secondary | ICD-10-CM

## 2016-05-07 DIAGNOSIS — E782 Mixed hyperlipidemia: Secondary | ICD-10-CM

## 2016-05-07 DIAGNOSIS — R7303 Prediabetes: Secondary | ICD-10-CM

## 2016-05-07 NOTE — Progress Notes (Signed)
Assessment and Plan:  Hypertension:  -stop ziac -keep BP log -Continue medication,  -monitor blood pressure at home.  -Continue DASH diet.   -Reminder to go to the ER if any CP, SOB, nausea, dizziness, severe HA, changes vision/speech, left arm numbness and tingling, and jaw pain.  Cholesterol: -Continue diet and exercise.  -Check cholesterol.   Pre-diabetes: -Continue diet and exercise.  -Check A1C  Vitamin D Def: -check level -continue medications.   Continue diet and meds as discussed. Further disposition pending results of labs.  HPI 65 y.o. male  presents for 3 month follow up with hypertension, hyperlipidemia, prediabetes and vitamin D.   His blood pressure has been controlled at home, today their BP is BP: 100/60.   He does not workout. He denies chest pain, shortness of breath, dizziness.  Blood pressure has been running low at home.  He does have momentary dizziness when he stands up.     He is on cholesterol medication and denies myalgias. His cholesterol is at goal. The cholesterol last visit was:   Lab Results  Component Value Date   CHOL 137 02/05/2016   HDL 58 02/05/2016   LDLCALC 59 02/05/2016   TRIG 99 02/05/2016   CHOLHDL 2.4 02/05/2016     He has been working on diet and exercise for prediabetes, and denies foot ulcerations, hyperglycemia, hypoglycemia , increased appetite, nausea, paresthesia of the feet, polydipsia, polyuria, visual disturbances, vomiting and weight loss. Last A1C in the office was:  Lab Results  Component Value Date   HGBA1C 5.3 02/05/2016    Patient is on Vitamin D supplement.  Lab Results  Component Value Date   VD25OH 61 02/05/2016     He reports that he is aware that he really needs to work on his diet.  He realizes that he has not been losing weight because he has not been able to cut back on his eating.    Current Medications:  Current Outpatient Prescriptions on File Prior to Visit  Medication Sig Dispense Refill  .  aspirin 81 MG tablet Take 81 mg by mouth every other day.     Marland Kitchen atorvastatin (LIPITOR) 40 MG tablet Take 1 tablet by mouth  daily 90 tablet 1  . bisoprolol-hydrochlorothiazide (ZIAC) 5-6.25 MG tablet Take 1 tablet every morning for BP 90 tablet 1  . buPROPion (WELLBUTRIN XL) 150 MG 24 hr tablet Take 1 tablet by mouth  daily 90 tablet 1  . Cholecalciferol (VITAMIN D-3) 1000 UNITS CAPS Take 2,000 Units by mouth daily.    . ranitidine (ZANTAC) 150 MG tablet TAKE 1 TABLET BY MOUTH TWO  TIMES DAILY 180 tablet 1  . solifenacin (VESICARE) 10 MG tablet Take 1 tablet (10 mg total) by mouth daily. 90 tablet 3   Current Facility-Administered Medications on File Prior to Visit  Medication Dose Route Frequency Provider Last Rate Last Dose  . 0.9 %  sodium chloride infusion  500 mL Intravenous Continuous Ladene Artist, MD        Medical History:  Past Medical History:  Diagnosis Date  . COPD (chronic obstructive pulmonary disease) ([place])   . GERD (gastroesophageal reflux disease)   . Hematuria   . HLD (hyperlipidemia)   . Hypertension   . IBS (irritable bowel syndrome)   . Pre-diabetes   . Vitamin D deficiency     Allergies: No Known Allergies   Review of Systems:  Review of Systems  Constitutional: Negative for chills, fever and malaise/fatigue.  HENT: Negative  for congestion, ear pain and sore throat.   Eyes: Negative.   Respiratory: Negative for cough, shortness of breath and wheezing.   Cardiovascular: Negative for chest pain, palpitations and leg swelling.  Gastrointestinal: Negative for abdominal pain, blood in stool, constipation, diarrhea, heartburn and melena.  Genitourinary: Negative.   Skin: Negative.   Neurological: Negative for dizziness, sensory change, loss of consciousness and headaches.  Psychiatric/Behavioral: Negative for depression. The patient is not nervous/anxious and does not have insomnia.     Family history- Review and unchanged  Social history- Review and  unchanged  Physical Exam: BP 100/60   Pulse 70   Temp 98.2 F (36.8 C) (Temporal)   Resp 16   Ht 5\' 8"  (1.727 m)   Wt 192 lb (87.1 kg)   BMI 29.19 kg/m  Wt Readings from Last 3 Encounters:  05/07/16 192 lb (87.1 kg)  02/28/16 187 lb (84.8 kg)  02/14/16 187 lb 12.8 oz (85.2 kg)    General Appearance: Well nourished well developed, in no apparent distress. Eyes: PERRLA, EOMs, conjunctiva no swelling or erythema ENT/Mouth: Ear canals normal without obstruction, swelling, erythma, discharge.  TMs normal bilaterally.  Oropharynx moist, clear, without exudate, or postoropharyngeal swelling. Neck: Supple, thyroid normal,no cervical adenopathy  Respiratory: Respiratory effort normal, Breath sounds clear A&P without rhonchi, wheeze, or rale.  No retractions, no accessory usage. Cardio: RRR with no MRGs. Brisk peripheral pulses without edema.  Abdomen: Soft, + BS,  Non tender, no guarding, rebound, hernias, masses. Musculoskeletal: Full ROM, 5/5 strength, Normal gait Skin: Warm, dry without rashes, lesions, ecchymosis.  Neuro: Awake and oriented X 3, Cranial nerves intact. Normal muscle tone, no cerebellar symptoms. Psych: Normal affect, Insight and Judgment appropriate.    Starlyn Skeans, PA-C 10:38 AM [facility]

## 2016-05-13 ENCOUNTER — Encounter: Payer: Self-pay | Admitting: Internal Medicine

## 2016-07-24 DIAGNOSIS — H31002 Unspecified chorioretinal scars, left eye: Secondary | ICD-10-CM | POA: Diagnosis not present

## 2016-07-24 DIAGNOSIS — Z961 Presence of intraocular lens: Secondary | ICD-10-CM | POA: Diagnosis not present

## 2016-07-28 ENCOUNTER — Other Ambulatory Visit: Payer: Self-pay | Admitting: Physician Assistant

## 2016-08-04 ENCOUNTER — Other Ambulatory Visit: Payer: Self-pay | Admitting: Internal Medicine

## 2016-08-04 DIAGNOSIS — N3281 Overactive bladder: Secondary | ICD-10-CM

## 2016-08-11 ENCOUNTER — Encounter: Payer: Self-pay | Admitting: Physician Assistant

## 2016-08-11 ENCOUNTER — Ambulatory Visit (INDEPENDENT_AMBULATORY_CARE_PROVIDER_SITE_OTHER): Payer: Medicare Other | Admitting: Physician Assistant

## 2016-08-11 VITALS — BP 118/74 | HR 68 | Temp 97.5°F | Resp 16 | Ht 68.0 in | Wt 189.4 lb

## 2016-08-11 DIAGNOSIS — F411 Generalized anxiety disorder: Secondary | ICD-10-CM | POA: Diagnosis not present

## 2016-08-11 DIAGNOSIS — E782 Mixed hyperlipidemia: Secondary | ICD-10-CM

## 2016-08-11 DIAGNOSIS — K219 Gastro-esophageal reflux disease without esophagitis: Secondary | ICD-10-CM

## 2016-08-11 DIAGNOSIS — K5732 Diverticulitis of large intestine without perforation or abscess without bleeding: Secondary | ICD-10-CM

## 2016-08-11 DIAGNOSIS — R7303 Prediabetes: Secondary | ICD-10-CM

## 2016-08-11 DIAGNOSIS — Z0001 Encounter for general adult medical examination with abnormal findings: Secondary | ICD-10-CM

## 2016-08-11 DIAGNOSIS — N3281 Overactive bladder: Secondary | ICD-10-CM

## 2016-08-11 DIAGNOSIS — Z0000 Encounter for general adult medical examination without abnormal findings: Secondary | ICD-10-CM

## 2016-08-11 DIAGNOSIS — I10 Essential (primary) hypertension: Secondary | ICD-10-CM | POA: Diagnosis not present

## 2016-08-11 DIAGNOSIS — E559 Vitamin D deficiency, unspecified: Secondary | ICD-10-CM | POA: Diagnosis not present

## 2016-08-11 DIAGNOSIS — R6889 Other general symptoms and signs: Secondary | ICD-10-CM

## 2016-08-11 DIAGNOSIS — J449 Chronic obstructive pulmonary disease, unspecified: Secondary | ICD-10-CM

## 2016-08-11 DIAGNOSIS — I700 Atherosclerosis of aorta: Secondary | ICD-10-CM

## 2016-08-11 DIAGNOSIS — Z79899 Other long term (current) drug therapy: Secondary | ICD-10-CM

## 2016-08-11 DIAGNOSIS — J4489 Other specified chronic obstructive pulmonary disease: Secondary | ICD-10-CM

## 2016-08-11 LAB — CBC WITH DIFFERENTIAL/PLATELET
BASOS PCT: 1 %
Basophils Absolute: 65 cells/uL (ref 0–200)
Eosinophils Absolute: 130 cells/uL (ref 15–500)
Eosinophils Relative: 2 %
HCT: 42 % (ref 38.5–50.0)
Hemoglobin: 14 g/dL (ref 13.2–17.1)
LYMPHS PCT: 26 %
Lymphs Abs: 1690 cells/uL (ref 850–3900)
MCH: 30.2 pg (ref 27.0–33.0)
MCHC: 33.3 g/dL (ref 32.0–36.0)
MCV: 90.5 fL (ref 80.0–100.0)
MONOS PCT: 10 %
MPV: 10.9 fL (ref 7.5–12.5)
Monocytes Absolute: 650 cells/uL (ref 200–950)
Neutro Abs: 3965 cells/uL (ref 1500–7800)
Neutrophils Relative %: 61 %
PLATELETS: 282 10*3/uL (ref 140–400)
RBC: 4.64 MIL/uL (ref 4.20–5.80)
RDW: 13.6 % (ref 11.0–15.0)
WBC: 6.5 10*3/uL (ref 3.8–10.8)

## 2016-08-11 LAB — HEPATIC FUNCTION PANEL
ALT: 29 U/L (ref 9–46)
AST: 27 U/L (ref 10–35)
Albumin: 4.2 g/dL (ref 3.6–5.1)
Alkaline Phosphatase: 85 U/L (ref 40–115)
BILIRUBIN DIRECT: 0.1 mg/dL (ref <=0.2)
BILIRUBIN INDIRECT: 0.4 mg/dL (ref 0.2–1.2)
Total Bilirubin: 0.5 mg/dL (ref 0.2–1.2)
Total Protein: 6.8 g/dL (ref 6.1–8.1)

## 2016-08-11 LAB — BASIC METABOLIC PANEL WITH GFR
BUN: 18 mg/dL (ref 7–25)
CALCIUM: 9.8 mg/dL (ref 8.6–10.3)
CO2: 29 mmol/L (ref 20–31)
Chloride: 104 mmol/L (ref 98–110)
Creat: 1.45 mg/dL — ABNORMAL HIGH (ref 0.70–1.25)
GFR, EST AFRICAN AMERICAN: 57 mL/min — AB (ref >=60)
GFR, Est Non African American: 49 mL/min — ABNORMAL LOW (ref >=60)
Glucose, Bld: 109 mg/dL — ABNORMAL HIGH (ref 65–99)
POTASSIUM: 4.5 mmol/L (ref 3.5–5.3)
Sodium: 140 mmol/L (ref 135–146)

## 2016-08-11 LAB — LIPID PANEL
CHOL/HDL RATIO: 2.4 ratio (ref <5.0)
CHOLESTEROL: 134 mg/dL (ref <200)
HDL: 57 mg/dL (ref >40)
LDL Cholesterol: 53 mg/dL (ref <100)
TRIGLYCERIDES: 118 mg/dL (ref <150)
VLDL: 24 mg/dL (ref <30)

## 2016-08-11 LAB — TSH: TSH: 1.3 mIU/L (ref 0.40–4.50)

## 2016-08-11 NOTE — Progress Notes (Signed)
Patient ID: [name] Park, male   DOB: 1950-10-19, 66 y.o.   MRN: [id number] Medicare wellness and 3 month follow up  Assessment and Plan:  Essential hypertension - continue medications, DASH diet, exercise and monitor at home. Call if greater than 130/80. -     CBC with Differential/Platelet -     BASIC METABOLIC PANEL WITH GFR -     Hepatic function panel -     TSH  COPD (chronic obstructive pulmonary disease) with chronic bronchitis ([place]) Has stopped smoking, no symptoms, continue to follow up  Mixed hyperlipidemia -continue medications, check lipids, decrease fatty foods, increase activity.  -     Lipid panel  Prediabetes -     Hemoglobin A1c Discussed general issues about diabetes pathophysiology and management., Educational material distributed., Suggested low cholesterol diet., Encouraged aerobic exercise., Discussed foot care., Reminded to get yearly retinal exam.   Medication management -     Magnesium  Vitamin D deficiency -     VITAMIN D 25 Hydroxy (Vit-D Deficiency, Fractures)  Generalized anxiety disorder Continue medications  OAB (overactive bladder) Continue vesicare  Diverticulitis of colon without hemorrhage UTD  Gastroesophageal reflux disease, esophagitis presence not specified Continue PPI/H2 blocker, diet discussed  Encounter for Medicare annual wellness exam  Thoracic aorta atherosclerosis ([place]) Control blood pressure, cholesterol, glucose, increase exercise.    Future Appointments Date Time Provider [place]  03/10/2017 10:00 AM Unk Pinto, MD [facility] None    Continue diet and meds as discussed. Further disposition pending results of labs. During the course of the visit the patient was educated and counseled about appropriate screening and preventive services including:    Pneumococcal vaccine   Influenza vaccine  Td vaccine  Screening electrocardiogram  Screening mammography  Bone densitometry  screening  Colorectal cancer screening  Diabetes screening  Glaucoma screening  Nutrition counseling   Advanced directives: given info/requested  HPI 66 y.o. male  presents for 3 month follow up with hypertension, hyperlipidemia, prediabetes and vitamin D and medicare wellness visit.    His blood pressure has been controlled at home, he restarted 1/4 of the 5mg  and BP has been better, today their BP is BP: 118/74.   He does workout. He denies chest pain, shortness of breath, dizziness. He use to smoke, quit over 11 years ago, does have diagnosis of COPD via CXR in 2013 but denies any SOB and has not had any exacerbations.  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   Lab Results  Component Value Date   GFRNONAA 50 (L) 02/05/2016    He is on cholesterol medication, he is on 1/2 lipitor 3 days a week and denies myalgias. His cholesterol is at goal. The cholesterol last visit was:   Lab Results  Component Value Date   CHOL 137 02/05/2016   HDL 58 02/05/2016   LDLCALC 59 02/05/2016   TRIG 99 02/05/2016   CHOLHDL 2.4 02/05/2016    He has been working on diet and exercise for prediabetes, and denies foot ulcerations, hyperglycemia, hypoglycemia , increased appetite, nausea, paresthesia of the feet, polydipsia, polyuria, visual disturbances, vomiting and weight loss. Last A1C in the office was:  Lab Results  Component Value Date   HGBA1C 5.3 02/05/2016   Patient is on Vitamin D supplement.  Lab Results  Component Value Date   VD25OH 20 02/05/2016   He is on vesicare for OAB.   BMI is Body mass index is 28.8 kg/m., he is working on diet and exercise.  Wt Readings from Last 3 Encounters:  08/11/16 189 lb 6.4 oz (85.9 kg)  05/07/16 192 lb (87.1 kg)  02/28/16 187 lb (84.8 kg)    Current Medications:  Current Outpatient Prescriptions on File Prior to Visit  Medication Sig Dispense Refill  . aspirin 81 MG tablet Take 81 mg by mouth every other day.     Marland Kitchen atorvastatin (LIPITOR) 40 MG  tablet Take 1 tablet by mouth  daily 90 tablet 1  . buPROPion (WELLBUTRIN XL) 150 MG 24 hr tablet TAKE 1 TABLET BY MOUTH  DAILY 90 tablet 1  . Cholecalciferol (VITAMIN D-3) 1000 UNITS CAPS Take 2,000 Units by mouth daily.    . ranitidine (ZANTAC) 150 MG tablet TAKE 1 TABLET BY MOUTH TWO  TIMES DAILY 180 tablet 1  . VESICARE 10 MG tablet TAKE 1 TABLET (10 MG TOTAL) BY MOUTH DAILY. 90 tablet 1  . bisoprolol-hydrochlorothiazide (ZIAC) 5-6.25 MG tablet Take 1 tablet every morning for BP 90 tablet 1   Current Facility-Administered Medications on File Prior to Visit  Medication Dose Route Frequency Provider Last Rate Last Dose  . 0.9 %  sodium chloride infusion  500 mL Intravenous Continuous Ladene Artist, MD        Medical History:  Past Medical History:  Diagnosis Date  . COPD (chronic obstructive pulmonary disease) ([place])   . GERD (gastroesophageal reflux disease)   . Hematuria   . HLD (hyperlipidemia)   . Hypertension   . IBS (irritable bowel syndrome)   . Pre-diabetes   . Vitamin D deficiency    Preventative care:  Last colonoscopy: 2017 Dr. Fuller Plan.  EGD: 01/2013 DEXA: N/A CXR 09/02/2013 CT chest 01/2016  Prior vaccinations: TD or Tdap: 2012         Influenza: 2016 Pneumococcal: 2016 Prevnar13: 2015 Shingles/Zostavax: 2015  Names of Other Physician/Practitioners you currently use: 1. [facility]- here for primary care 2. Dr. Katy Apo, eye doctor, last visit 07/2016, yearly, s/p cataract surgery 3. Dr. Quillian Quince, dentist, last visit 6 months ago, has appt this month.  Patient Care Team: Unk Pinto, MD as PCP - General (Internal Medicine) Katy Apo, MD as Consulting Physician (Ophthalmology) Ladene Artist, MD as Consulting Physician (Gastroenterology) Eula Listen, DDS as Referring Physician (Dentistry) Druscilla Brownie, MD as Consulting Physician (Dermatology)  Allergies No Known Allergies  SURGICAL HISTORY He  has  a past surgical history that includes Cervical fusion; Trigger finger release (Right); Carpal tunnel release (Right); and Hand surgery (Left). FAMILY HISTORY His family history includes COPD in his mother; Cancer in his father; Diverticulosis in his mother; Hypertension in his mother; Irritable bowel syndrome in his mother; Kidney disease in his father; Lung cancer in his father; Lupus in his mother; Other in his father; Stroke in his father. SOCIAL HISTORY He  reports that he quit smoking about 12 years ago. His smoking use included Cigarettes. He has a 52.50 pack-year smoking history. He has never used smokeless tobacco. He reports that he drinks alcohol. He reports that he does not use drugs.  MEDICARE WELLNESS OBJECTIVES: Physical activity: Current Exercise Habits: The patient does not participate in regular exercise at present Cardiac risk factors: Cardiac Risk Factors include: advanced age (>42men, >41 women);dyslipidemia;hypertension;male gender;sedentary lifestyle Depression/mood screen:   Depression screen [facility] 2/9 08/11/2016  Decreased Interest 0  Down, Depressed, Hopeless 0  PHQ - 2 Score 0    ADLs:  In your present state of health, do you have any difficulty performing  the following activities: 08/11/2016 02/05/2016  Hearing? N N  Vision? N N  Difficulty concentrating or making decisions? N N  Walking or climbing stairs? N N  Dressing or bathing? N N  Doing errands, shopping? N N  Preparing Food and eating ? - -  Using the Toilet? - -  In the past six months, have you accidently leaked urine? - -  Do you have problems with loss of bowel control? - -  Managing your Medications? - -  Managing your Finances? - -  Housekeeping or managing your Housekeeping? - -  Some recent data might be hidden     Cognitive Testing  Alert? Yes  Normal Appearance?Yes  Oriented to person? Yes  Place? Yes   Time? Yes  Recall of three objects?  Yes  Can perform simple calculations?  Yes  Displays appropriate judgment?Yes  Can read the correct time from a watch face?Yes  EOL planning: Does Patient Have a Medical Advance Directive?: Yes Type of Advance Directive: Healthcare Power of Attorney, Living will Does patient want to make changes to medical advance directive?: No - Patient declined Copy of [place] in Chart?: No - copy requested    Review of Systems:  Review of Systems  Constitutional: Negative for chills, fever and malaise/fatigue.  HENT: Negative for congestion, ear pain and sore throat.   Respiratory: Negative for cough, shortness of breath and wheezing.   Cardiovascular: Negative for chest pain, palpitations and leg swelling.  Gastrointestinal: Negative for blood in stool, constipation, diarrhea, heartburn and melena.  Genitourinary: Negative.   Musculoskeletal: Positive for myalgias.  Skin: Negative.   Neurological: Negative for dizziness, sensory change, loss of consciousness and headaches.  Psychiatric/Behavioral: Negative for depression. The patient is not nervous/anxious and does not have insomnia.     Physical Exam: BP 118/74   Pulse 68   Temp 97.5 F (36.4 C)   Resp 16   Ht 5\' 8"  (1.727 m)   Wt 189 lb 6.4 oz (85.9 kg)   BMI 28.80 kg/m  Wt Readings from Last 3 Encounters:  08/11/16 189 lb 6.4 oz (85.9 kg)  05/07/16 192 lb (87.1 kg)  02/28/16 187 lb (84.8 kg)    General Appearance: Well nourished well developed, in no apparent distress. Eyes: PERRLA, EOMs, conjunctiva no swelling or erythema ENT/Mouth: Ear canals normal without obstruction, swelling, erythma, discharge.  TMs normal bilaterally.  Oropharynx moist, clear, without exudate, or postoropharyngeal swelling. Neck: Supple, thyroid normal,no cervical adenopathy  Respiratory: Respiratory effort normal, Breath sounds clear A&P without rhonchi, wheeze, or rale.  No retractions, no accessory usage. Cardio: RRR with no MRGs. Brisk peripheral pulses without edema.   Abdomen: Soft, + BS,  Non tender, no guarding, rebound, hernias, masses. Musculoskeletal: Full ROM, 5/5 strength, Normal gait Skin: Warm, dry without rashes, lesions, ecchymosis.  Neuro: Awake and oriented X 3, Cranial nerves intact. Normal muscle tone, no cerebellar symptoms. Psych: Normal affect, Insight and Judgment appropriate.    Medicare Attestation I have personally reviewed: The patient's medical and social history Their use of alcohol, tobacco or illicit drugs Their current medications and supplements The patient's functional ability including ADLs,fall risks, home safety risks, cognitive, and hearing and visual impairment Diet and physical activities Evidence for depression or mood disorders  The patient's weight, height, BMI, and visual acuity have been recorded in the chart.  I have made referrals, counseling, and provided education to the patient based on review of the above and I have provided the  patient with a written personalized care plan for preventive services.   Vicie Mutters, PA-C 11:03 AM [facility]

## 2016-08-11 NOTE — Patient Instructions (Signed)
Bad carbs also include fruit juice, alcohol, and sweet tea. These are empty calories that do not signal to your brain that you are full.   Please remember the good carbs are still carbs which convert into sugar. So please measure them out no more than 1/2-1 cup of rice, oatmeal, pasta, and beans  Veggies are however free foods! Pile them on.   Not all fruit is created equal. Please see the list below, the fruit at the bottom is higher in sugars than the fruit at the top. Please avoid all dried fruits.      Vascular Dementia Introduction Dementia is a condition in which a person has problems with thinking, memory, and behavior that are severe enough to interfere with daily life. Vascular dementia is a type of dementia. It results from brain damage that is caused by the brain not getting enough blood. Vascular dementia usually begins between 75 and 63 years of age. What are the causes? Vascular dementia is caused by conditions that lessen blood flow to the brain. Common causes include:  Multiple small strokes. These may happen without symptoms (silent stroke).  Major stroke.  Damage to small blood vessels in the brain (cerebral small vessel disease). What increases the risk?  Advancing age.  Having had a stroke.  Having high blood pressure (hypertension) or high cholesterol.  Having a disease that affects the heart or blood vessels.  Smoking.  Having diabetes.  Being male.  Being obese.  Not being active.  Having depression. What are the signs or symptoms? Symptoms can vary a lot from one person to another. Symptoms may be mild or severe depending on the amount of damage and which parts of the brain have been affected. Symptoms may begin suddenly or may develop gradually. Symptoms may remain stable, or they may get worse over time. Symptoms of vascular dementia may be similar to those of Alzheimer disease. The two conditions can occur together (mixed  dementia). Symptoms of vascular dementia may include: Mental  Confusion.  Memory problems.  Poor attention and concentration.  Trouble understanding speech.  Depression.  Personality changes.  Trouble recognizing familiar people.  Agitation or aggression.  Paranoia.  Delusions or hallucinations. Physical  Weakness.  Poor balance.  Loss of bladder or bowel control (incontinence).  Unsteady walking (gait).  Speaking problems. Behavioral  Getting lost in familiar places.  Problems with planning and judgment.  Trouble following instructions.  Social problems.  Emotional outbursts.  Trouble with daily activities and self-care.  Problems handling money. How is this diagnosed? There is not a specific test to diagnose vascular dementia. The health care provider will consider the person's medical history and symptoms or changes that are reported by friends and family. The health care provider will do a physical exam and may order lab tests or other tests that check brain and nervous system function. Tests that may be done include:  Blood tests.  Brain imaging tests.  Tests of movement, speech, and other daily activities (neurological exam).  Tests of memory, thinking, and problem-solving (neuropsychological or neurocognitive testing). Diagnosis may involve several specialists. These may include a health care provider who specializes in the brain and nervous system (neurologist), a provider who specializes in disorders of the mind (psychiatrist), and a provider who focuses on speech and language changes (Electrical engineer). How is this treated? There is no cure for vascular dementia. Brain damage that has already occurred cannot be reversed. Treatment depends on:  How severe the condition is.  Which parts of the brain have been affected.  The person's overall health. Treatment measures aim to:  Treat the underlying cause of vascular dementia and manage  risk factors. This may include:  Controlling blood pressure.  Lowering cholesterol.  Treating diabetes.  Quitting smoking.  Losing weight.  Manage symptoms.  Prevent further brain damage.  Improve the person's health and quality of life. Treatment for dementia may involve a team of health care providers, including:  A neurologist.  A psychiatrist.  An occupational therapist.  A speech pathologist.  A cardiologist.  An exercise physiologist or physical therapist. Follow these instructions at home: Home care for a person with vascular dementia depends on what caused the condition and how severe the symptoms are. General guidelines for care at home include:  Following the health care provider's instructions for treating the condition that caused the dementia.  Using medicines only as told by the person's health care provider.  Creating a safe living space.  Learning ways to help the person remember people, appointments, and daily activities.  Finding a support group to help caregivers and family to cope with the effects of dementia.  Helping family and friends learn about ways to communicate with a person who has dementia.  Making sure the person keeps all follow-up visits and goes to all rehabilitation appointments as told by the health care team. This is important. Contact a health care provider if:  A fever develops.  New behavioral problems develop.  Problems with swallowing develop.  Confusion gets worse.  Sleepiness gets worse. Get help right away if:  Loss of consciousness occurs.  There is a sudden loss of speech, balance, or thinking ability.  New numbness or paralysis occurs.  Sudden, severe headache occurs.  Vision is lost or suddenly gets worse in one or both eyes. This information is not intended to replace advice given to you by your health care provider. Make sure you discuss any questions you have with your health care  provider. Document Released: 05/30/2002 Document Revised: 11/15/2015 Document Reviewed: 09/20/2014  2017 Else[employer]

## 2016-08-12 LAB — VITAMIN D 25 HYDROXY (VIT D DEFICIENCY, FRACTURES): Vit D, 25-Hydroxy: 59 ng/mL (ref 30–100)

## 2016-08-12 LAB — HEMOGLOBIN A1C
Hgb A1c MFr Bld: 5.4 % (ref <5.7)
Mean Plasma Glucose: 108 mg/dL

## 2016-08-12 LAB — MAGNESIUM: Magnesium: 1.8 mg/dL (ref 1.5–2.5)

## 2016-09-24 DIAGNOSIS — D1801 Hemangioma of skin and subcutaneous tissue: Secondary | ICD-10-CM | POA: Diagnosis not present

## 2016-09-24 DIAGNOSIS — L309 Dermatitis, unspecified: Secondary | ICD-10-CM | POA: Diagnosis not present

## 2016-09-24 DIAGNOSIS — L821 Other seborrheic keratosis: Secondary | ICD-10-CM | POA: Diagnosis not present

## 2016-10-03 DIAGNOSIS — M545 Low back pain: Secondary | ICD-10-CM | POA: Diagnosis not present

## 2016-10-12 ENCOUNTER — Other Ambulatory Visit: Payer: Self-pay | Admitting: Internal Medicine

## 2016-10-12 DIAGNOSIS — I10 Essential (primary) hypertension: Secondary | ICD-10-CM

## 2016-10-13 ENCOUNTER — Encounter (INDEPENDENT_AMBULATORY_CARE_PROVIDER_SITE_OTHER): Payer: Self-pay | Admitting: Orthopaedic Surgery

## 2016-10-13 ENCOUNTER — Telehealth (INDEPENDENT_AMBULATORY_CARE_PROVIDER_SITE_OTHER): Payer: Self-pay | Admitting: Orthopaedic Surgery

## 2016-10-13 ENCOUNTER — Ambulatory Visit (INDEPENDENT_AMBULATORY_CARE_PROVIDER_SITE_OTHER): Payer: Medicare Other | Admitting: Orthopaedic Surgery

## 2016-10-13 DIAGNOSIS — M545 Low back pain, unspecified: Secondary | ICD-10-CM

## 2016-10-13 MED ORDER — DICLOFENAC SODIUM 75 MG PO TBEC: 75.0000 mg | DELAYED_RELEASE_TABLET | Freq: Two times a day (BID) | ORAL | 2 refills | Status: DC

## 2016-10-13 MED ORDER — METHOCARBAMOL 500 MG PO TABS: 500.0000 mg | ORAL_TABLET | Freq: Four times a day (QID) | ORAL | 2 refills | Status: DC | PRN

## 2016-10-13 NOTE — Telephone Encounter (Signed)
Pt called to remind you about a referral for phys therapy.

## 2016-10-13 NOTE — Progress Notes (Signed)
Office Visit Note   Patient: [name] Park           Date of Birth: July 24, 1950           MRN: [id number] Visit Date: 10/13/2016              Requested by: Unk Pinto, MD [address] [place], [place] [id number] PCP: Alesia Richards, MD   Assessment & Plan: Visit Diagnoses:  1. Acute midline low back pain without sciatica     Plan: Patient has improving lumbar strain. I did prescribe him Robaxin and diclofenac. Physical therapy referral was made. Questions encouraged and answered. Follow-up with me as needed.  Follow-Up Instructions: Return if symptoms worsen or fail to improve.   Orders:  No orders of the defined types were placed in this encounter.  Meds ordered this encounter  Medications  . methocarbamol (ROBAXIN) 500 MG tablet    Sig: Take 1 tablet (500 mg total) by mouth every 6 (six) hours as needed for muscle spasms.    Dispense:  30 tablet    Refill:  2  . diclofenac (VOLTAREN) 75 MG EC tablet    Sig: Take 1 tablet (75 mg total) by mouth 2 (two) times daily.    Dispense:  30 tablet    Refill:  2      Procedures: No procedures performed   Clinical Data: No additional findings.   Subjective: Chief Complaint  Patient presents with  . Lower Back - Pain    Gentleman who has had back pain for a few weeks acutely after loading things into his truck.. He has taken prednisone and Flexeril and has overall gotten better. He still has some discomfort. Denies any radicular symptoms or any bowel or bladder dysfunction. I did take care of his mother in law hip fracture about 3 years ago.    Review of Systems  Constitutional: Negative.   All other systems reviewed and are negative.    Objective: Vital Signs: There were no vitals taken for this visit.  Physical Exam  Constitutional: He is oriented to person, place, and time. He appears well-developed and well-nourished.  HENT:  Head: Normocephalic and atraumatic.  Eyes: Pupils  are equal, round, and reactive to light.  Neck: Neck supple.  Pulmonary/Chest: Effort normal.  Abdominal: Soft.  Musculoskeletal: Normal range of motion.  Neurological: He is alert and oriented to person, place, and time.  Skin: Skin is warm.  Psychiatric: He has a normal mood and affect. His behavior is normal. Judgment and thought content normal.  Nursing note and vitals reviewed.   Ortho Exam Low back exam shows no palpable defects or step-offs. His range of motion is adequate. No sciatic tension signs or focal findings. Specialty Comments:  No specialty comments available.  Imaging: No results found.   PMFS History: Patient Active Problem List   Diagnosis Date Noted  . Thoracic aorta atherosclerosis ([facility]) 08/11/2016  . Encounter for general adult medical examination with abnormal findings 02/05/2016  . COPD (chronic obstructive pulmonary disease) with chronic bronchitis ([place]) 09/07/2014  . Generalized anxiety disorder 09/07/2014  . Hyperlipidemia 06/08/2014  . GERD  03/08/2014  . OAB (overactive bladder) 03/08/2014  . Diverticulitis of colon without hemorrhage 03/08/2014  . Essential hypertension 12/05/2013  . Prediabetes 12/05/2013  . Medication management 12/05/2013  . Vitamin D deficiency 06/21/2013   Past Medical History:  Diagnosis Date  . COPD (chronic obstructive pulmonary disease) ([facility])   . GERD (gastroesophageal reflux disease)   .  Hematuria   . HLD (hyperlipidemia)   . Hypertension   . IBS (irritable bowel syndrome)   . Pre-diabetes   . Vitamin D deficiency     Family History  Problem Relation Age of Onset  . Irritable bowel syndrome Mother   . Diverticulosis Mother   . Lupus Mother   . COPD Mother   . Hypertension Mother   . Lung cancer Father   . Other Father     bladder polyps  . Stroke Father   . Kidney disease Father   . Cancer Father   . Colon cancer Neg Hx   . Esophageal cancer Neg Hx   . Rectal cancer Neg Hx   . Stomach cancer Neg  Hx     Past Surgical History:  Procedure Laterality Date  . CARPAL TUNNEL RELEASE Right   . CERVICAL FUSION    . HAND SURGERY Left   . TRIGGER FINGER RELEASE Right    thumb   Social History   Occupational History  . retired    Social History Main Topics  . Smoking status: Former Smoker    Packs/day: 1.50    Years: 35.00    Types: Cigarettes    Quit date: 02/05/2004  . Smokeless tobacco: Never Used  . Alcohol use Yes     Comment: seldom  . Drug use: No  . Sexual activity: Not on file

## 2016-10-15 NOTE — Telephone Encounter (Signed)
Called pt I advised him we gave him a PT form on his last appt 10/13/16 and he states he has it. I told him he would have to choose a location and call and schedule at his convinence.  Pt aware.

## 2016-10-20 DIAGNOSIS — M545 Low back pain: Secondary | ICD-10-CM | POA: Diagnosis not present

## 2016-11-10 ENCOUNTER — Other Ambulatory Visit: Payer: Self-pay | Admitting: Internal Medicine

## 2016-11-24 ENCOUNTER — Ambulatory Visit (INDEPENDENT_AMBULATORY_CARE_PROVIDER_SITE_OTHER): Payer: Medicare Other | Admitting: Physician Assistant

## 2016-11-24 ENCOUNTER — Encounter (HOSPITAL_COMMUNITY)
Admission: EM | Disposition: A | Discharge status: Home / Self Care | Payer: Self-pay | Source: Home / Self Care | Attending: Emergency Medicine

## 2016-11-24 ENCOUNTER — Observation Stay (HOSPITAL_COMMUNITY): Payer: Medicare Other | Admitting: Certified Registered Nurse Anesthetist

## 2016-11-24 ENCOUNTER — Encounter (HOSPITAL_COMMUNITY): Payer: Self-pay | Admitting: *Deleted

## 2016-11-24 ENCOUNTER — Ambulatory Visit
Admission: RE | Admit: 2016-11-24 | Discharge: 2016-11-24 | Disposition: A | Discharge status: Home / Self Care | Payer: Medicare Other | Source: Ambulatory Visit | Attending: Physician Assistant | Admitting: Physician Assistant

## 2016-11-24 ENCOUNTER — Other Ambulatory Visit: Payer: Self-pay | Admitting: Physician Assistant

## 2016-11-24 ENCOUNTER — Observation Stay (HOSPITAL_COMMUNITY)
Admission: EM | Admit: 2016-11-24 | Discharge: 2016-11-25 | Disposition: A | Discharge status: Home / Self Care | Payer: Medicare Other | Attending: General Surgery | Admitting: General Surgery

## 2016-11-24 VITALS — BP 116/70 | HR 72 | Temp 97.3°F | Resp 16 | Ht 68.0 in | Wt 190.0 lb

## 2016-11-24 DIAGNOSIS — K352 Acute appendicitis with generalized peritonitis: Secondary | ICD-10-CM | POA: Diagnosis not present

## 2016-11-24 DIAGNOSIS — R6883 Chills (without fever): Secondary | ICD-10-CM | POA: Diagnosis not present

## 2016-11-24 DIAGNOSIS — Z8379 Family history of other diseases of the digestive system: Secondary | ICD-10-CM | POA: Diagnosis not present

## 2016-11-24 DIAGNOSIS — K37 Unspecified appendicitis: Secondary | ICD-10-CM | POA: Diagnosis not present

## 2016-11-24 DIAGNOSIS — Z7982 Long term (current) use of aspirin: Secondary | ICD-10-CM | POA: Insufficient documentation

## 2016-11-24 DIAGNOSIS — R609 Edema, unspecified: Secondary | ICD-10-CM | POA: Diagnosis not present

## 2016-11-24 DIAGNOSIS — E559 Vitamin D deficiency, unspecified: Secondary | ICD-10-CM | POA: Diagnosis not present

## 2016-11-24 DIAGNOSIS — Z981 Arthrodesis status: Secondary | ICD-10-CM | POA: Insufficient documentation

## 2016-11-24 DIAGNOSIS — Z87891 Personal history of nicotine dependence: Secondary | ICD-10-CM | POA: Insufficient documentation

## 2016-11-24 DIAGNOSIS — R197 Diarrhea, unspecified: Secondary | ICD-10-CM

## 2016-11-24 DIAGNOSIS — K353 Acute appendicitis with localized peritonitis: Secondary | ICD-10-CM | POA: Diagnosis not present

## 2016-11-24 DIAGNOSIS — Z8249 Family history of ischemic heart disease and other diseases of the circulatory system: Secondary | ICD-10-CM | POA: Insufficient documentation

## 2016-11-24 DIAGNOSIS — Z825 Family history of asthma and other chronic lower respiratory diseases: Secondary | ICD-10-CM | POA: Diagnosis not present

## 2016-11-24 DIAGNOSIS — I700 Atherosclerosis of aorta: Secondary | ICD-10-CM | POA: Diagnosis not present

## 2016-11-24 DIAGNOSIS — R10823 Right lower quadrant rebound abdominal tenderness: Secondary | ICD-10-CM

## 2016-11-24 DIAGNOSIS — K3589 Other acute appendicitis without perforation or gangrene: Secondary | ICD-10-CM

## 2016-11-24 DIAGNOSIS — K219 Gastro-esophageal reflux disease without esophagitis: Secondary | ICD-10-CM | POA: Insufficient documentation

## 2016-11-24 DIAGNOSIS — I10 Essential (primary) hypertension: Secondary | ICD-10-CM | POA: Diagnosis not present

## 2016-11-24 DIAGNOSIS — K429 Umbilical hernia without obstruction or gangrene: Secondary | ICD-10-CM | POA: Insufficient documentation

## 2016-11-24 DIAGNOSIS — Z801 Family history of malignant neoplasm of trachea, bronchus and lung: Secondary | ICD-10-CM | POA: Insufficient documentation

## 2016-11-24 DIAGNOSIS — E785 Hyperlipidemia, unspecified: Secondary | ICD-10-CM | POA: Diagnosis not present

## 2016-11-24 DIAGNOSIS — K589 Irritable bowel syndrome without diarrhea: Secondary | ICD-10-CM | POA: Insufficient documentation

## 2016-11-24 DIAGNOSIS — K3580 Unspecified acute appendicitis: Secondary | ICD-10-CM | POA: Diagnosis not present

## 2016-11-24 DIAGNOSIS — N3281 Overactive bladder: Secondary | ICD-10-CM | POA: Insufficient documentation

## 2016-11-24 DIAGNOSIS — R0600 Dyspnea, unspecified: Secondary | ICD-10-CM

## 2016-11-24 DIAGNOSIS — Z79899 Other long term (current) drug therapy: Secondary | ICD-10-CM | POA: Insufficient documentation

## 2016-11-24 DIAGNOSIS — R7303 Prediabetes: Secondary | ICD-10-CM | POA: Insufficient documentation

## 2016-11-24 DIAGNOSIS — Z8719 Personal history of other diseases of the digestive system: Secondary | ICD-10-CM | POA: Insufficient documentation

## 2016-11-24 DIAGNOSIS — I739 Peripheral vascular disease, unspecified: Secondary | ICD-10-CM | POA: Diagnosis not present

## 2016-11-24 DIAGNOSIS — J449 Chronic obstructive pulmonary disease, unspecified: Secondary | ICD-10-CM | POA: Insufficient documentation

## 2016-11-24 DIAGNOSIS — F411 Generalized anxiety disorder: Secondary | ICD-10-CM | POA: Insufficient documentation

## 2016-11-24 DIAGNOSIS — R1031 Right lower quadrant pain: Secondary | ICD-10-CM | POA: Diagnosis not present

## 2016-11-24 HISTORY — PX: LAPAROSCOPIC APPENDECTOMY: SHX408

## 2016-11-24 HISTORY — DX: Unspecified acute appendicitis: K35.80

## 2016-11-24 LAB — COMPREHENSIVE METABOLIC PANEL
ALT: 28 U/L (ref 17–63)
AST: 29 U/L (ref 15–41)
Albumin: 4.2 g/dL (ref 3.5–5.0)
Alkaline Phosphatase: 83 U/L (ref 38–126)
Anion gap: 11 (ref 5–15)
BUN: 16 mg/dL (ref 6–20)
CHLORIDE: 99 mmol/L — AB (ref 101–111)
CO2: 26 mmol/L (ref 22–32)
CREATININE: 1.54 mg/dL — AB (ref 0.61–1.24)
Calcium: 9.4 mg/dL (ref 8.9–10.3)
GFR calc Af Amer: 52 mL/min — ABNORMAL LOW (ref >60)
GFR, EST NON AFRICAN AMERICAN: 45 mL/min — AB (ref >60)
Glucose, Bld: 84 mg/dL (ref 65–99)
Potassium: 3.9 mmol/L (ref 3.5–5.1)
Sodium: 136 mmol/L (ref 135–145)
TOTAL PROTEIN: 7.1 g/dL (ref 6.5–8.1)
Total Bilirubin: 1.4 mg/dL — ABNORMAL HIGH (ref 0.3–1.2)

## 2016-11-24 LAB — HEPATIC FUNCTION PANEL
ALT: 23 U/L (ref 9–46)
AST: 24 U/L (ref 10–35)
Albumin: 4.1 g/dL (ref 3.6–5.1)
Alkaline Phosphatase: 80 U/L (ref 40–115)
BILIRUBIN DIRECT: 0.2 mg/dL (ref <=0.2)
BILIRUBIN INDIRECT: 0.8 mg/dL (ref 0.2–1.2)
Total Bilirubin: 1 mg/dL (ref 0.2–1.2)
Total Protein: 6.9 g/dL (ref 6.1–8.1)

## 2016-11-24 LAB — URINALYSIS, ROUTINE W REFLEX MICROSCOPIC
Bilirubin Urine: NEGATIVE
GLUCOSE, UA: NEGATIVE mg/dL
Hgb urine dipstick: NEGATIVE
Ketones, ur: 5 mg/dL — AB
LEUKOCYTES UA: NEGATIVE
NITRITE: NEGATIVE
PROTEIN: NEGATIVE mg/dL
Specific Gravity, Urine: 1.01 (ref 1.005–1.030)
pH: 6 (ref 5.0–8.0)

## 2016-11-24 LAB — ABO/RH: ABO/RH(D): B POS

## 2016-11-24 LAB — BASIC METABOLIC PANEL WITH GFR
BUN: 18 mg/dL (ref 7–25)
CALCIUM: 9.5 mg/dL (ref 8.6–10.3)
CHLORIDE: 101 mmol/L (ref 98–110)
CO2: 27 mmol/L (ref 20–31)
Creat: 1.64 mg/dL — ABNORMAL HIGH (ref 0.70–1.25)
GFR, EST NON AFRICAN AMERICAN: 43 mL/min — AB (ref >=60)
GFR, Est African American: 49 mL/min — ABNORMAL LOW (ref >=60)
Glucose, Bld: 84 mg/dL (ref 65–99)
Potassium: 5.2 mmol/L (ref 3.5–5.3)
SODIUM: 138 mmol/L (ref 135–146)

## 2016-11-24 LAB — CBC WITH DIFFERENTIAL/PLATELET
Basophils Absolute: 0 cells/uL (ref 0–200)
Basophils Relative: 0 %
EOS PCT: 2 %
Eosinophils Absolute: 118 cells/uL (ref 15–500)
HCT: 39 % (ref 38.5–50.0)
HEMOGLOBIN: 13.2 g/dL (ref 13.2–17.1)
LYMPHS ABS: 1652 {cells}/uL (ref 850–3900)
LYMPHS PCT: 28 %
MCH: 31.1 pg (ref 27.0–33.0)
MCHC: 33.8 g/dL (ref 32.0–36.0)
MCV: 92 fL (ref 80.0–100.0)
MPV: 10.5 fL (ref 7.5–12.5)
Monocytes Absolute: 708 cells/uL (ref 200–950)
Monocytes Relative: 12 %
NEUTROS PCT: 58 %
Neutro Abs: 3422 cells/uL (ref 1500–7800)
Platelets: 256 10*3/uL (ref 140–400)
RBC: 4.24 MIL/uL (ref 4.20–5.80)
RDW: 13.6 % (ref 11.0–15.0)
WBC: 5.9 10*3/uL (ref 3.8–10.8)

## 2016-11-24 LAB — CBC
HCT: 38.9 % — ABNORMAL LOW (ref 39.0–52.0)
Hemoglobin: 13 g/dL (ref 13.0–17.0)
MCH: 30.3 pg (ref 26.0–34.0)
MCHC: 33.4 g/dL (ref 30.0–36.0)
MCV: 90.7 fL (ref 78.0–100.0)
PLATELETS: 238 10*3/uL (ref 150–400)
RBC: 4.29 MIL/uL (ref 4.22–5.81)
RDW: 13.4 % (ref 11.5–15.5)
WBC: 6.5 10*3/uL (ref 4.0–10.5)

## 2016-11-24 LAB — BRAIN NATRIURETIC PEPTIDE: BRAIN NATRIURETIC PEPTIDE: 24.5 pg/mL (ref <100)

## 2016-11-24 LAB — LIPASE, BLOOD: LIPASE: 38 U/L (ref 11–51)

## 2016-11-24 LAB — TSH: TSH: 0.65 mIU/L (ref 0.40–4.50)

## 2016-11-24 SURGERY — APPENDECTOMY, LAPAROSCOPIC
Anesthesia: General | Site: Abdomen

## 2016-11-24 MED ORDER — DOCUSATE SODIUM 100 MG PO CAPS
100.0000 mg | ORAL_CAPSULE | Freq: Two times a day (BID) | ORAL | Status: DC
  Administered 2016-11-24 – 2016-11-25 (×2): 100 mg via ORAL
  Filled 2016-11-24 (×2): qty 1

## 2016-11-24 MED ORDER — ROCURONIUM BROMIDE 100 MG/10ML IV SOLN
INTRAVENOUS | Status: DC | PRN
  Administered 2016-11-24: 50 mg via INTRAVENOUS

## 2016-11-24 MED ORDER — ONDANSETRON HCL 4 MG/2ML IJ SOLN
INTRAMUSCULAR | Status: AC
  Filled 2016-11-24: qty 4

## 2016-11-24 MED ORDER — SODIUM CHLORIDE 0.9 % IV SOLN
INTRAVENOUS | Status: DC
  Administered 2016-11-24 – 2016-11-25 (×2): via INTRAVENOUS

## 2016-11-24 MED ORDER — SUGAMMADEX SODIUM 200 MG/2ML IV SOLN
INTRAVENOUS | Status: DC | PRN
  Administered 2016-11-24: 350 mg via INTRAVENOUS

## 2016-11-24 MED ORDER — LACTATED RINGERS IV SOLN
INTRAVENOUS | Status: DC | PRN
  Administered 2016-11-24 (×2): via INTRAVENOUS

## 2016-11-24 MED ORDER — FENTANYL CITRATE (PF) 100 MCG/2ML IJ SOLN
25.0000 ug | INTRAMUSCULAR | Status: DC | PRN
  Administered 2016-11-24 (×3): 50 ug via INTRAVENOUS

## 2016-11-24 MED ORDER — MIDAZOLAM HCL 2 MG/2ML IJ SOLN
INTRAMUSCULAR | Status: AC
  Filled 2016-11-24: qty 2

## 2016-11-24 MED ORDER — DIPHENHYDRAMINE HCL 50 MG/ML IJ SOLN: 12.5000 mg | Freq: Four times a day (QID) | INTRAMUSCULAR | Status: DC | PRN

## 2016-11-24 MED ORDER — ONDANSETRON HCL 4 MG/2ML IJ SOLN
4.0000 mg | Freq: Four times a day (QID) | INTRAMUSCULAR | Status: DC | PRN
Start: 2016-11-24 — End: 2016-11-24

## 2016-11-24 MED ORDER — ONDANSETRON HCL 4 MG/2ML IJ SOLN
4.0000 mg | Freq: Four times a day (QID) | INTRAMUSCULAR | Status: DC | PRN
  Administered 2016-11-25: 4 mg via INTRAVENOUS
  Filled 2016-11-24: qty 2

## 2016-11-24 MED ORDER — HYDROMORPHONE HCL 1 MG/ML IJ SOLN: 0.5000 mg | INTRAMUSCULAR | Status: DC | PRN

## 2016-11-24 MED ORDER — BUPIVACAINE-EPINEPHRINE (PF) 0.25% -1:200000 IJ SOLN
INTRAMUSCULAR | Status: DC | PRN
  Administered 2016-11-24: 20 mL

## 2016-11-24 MED ORDER — ROCURONIUM BROMIDE 10 MG/ML (PF) SYRINGE
PREFILLED_SYRINGE | INTRAVENOUS | Status: AC
  Filled 2016-11-24: qty 5

## 2016-11-24 MED ORDER — PROPOFOL 10 MG/ML IV BOLUS
INTRAVENOUS | Status: AC
  Filled 2016-11-24: qty 40

## 2016-11-24 MED ORDER — LIDOCAINE 2% (20 MG/ML) 5 ML SYRINGE
INTRAMUSCULAR | Status: AC
  Filled 2016-11-24: qty 5

## 2016-11-24 MED ORDER — SODIUM CHLORIDE 0.9 % IV BOLUS (SEPSIS)
1000.0000 mL | Freq: Once | INTRAVENOUS | Status: AC
  Administered 2016-11-24: 1000 mL via INTRAVENOUS

## 2016-11-24 MED ORDER — FENTANYL CITRATE (PF) 100 MCG/2ML IJ SOLN
INTRAMUSCULAR | Status: AC
  Filled 2016-11-24: qty 2

## 2016-11-24 MED ORDER — METRONIDAZOLE IN NACL 5-0.79 MG/ML-% IV SOLN
500.0000 mg | Freq: Once | INTRAVENOUS | Status: AC
  Administered 2016-11-24: 500 mg via INTRAVENOUS
  Filled 2016-11-24: qty 100

## 2016-11-24 MED ORDER — HEPARIN SODIUM (PORCINE) 5000 UNIT/ML IJ SOLN: 5000.0000 [IU] | Freq: Three times a day (TID) | INTRAMUSCULAR | Status: DC

## 2016-11-24 MED ORDER — METRONIDAZOLE IN NACL 5-0.79 MG/ML-% IV SOLN
500.0000 mg | Freq: Three times a day (TID) | INTRAVENOUS | Status: DC
  Administered 2016-11-25 (×2): 500 mg via INTRAVENOUS
  Filled 2016-11-24 (×3): qty 100

## 2016-11-24 MED ORDER — BUPIVACAINE HCL 0.25 % IJ SOLN: INTRAMUSCULAR | Status: DC | PRN

## 2016-11-24 MED ORDER — OXYCODONE HCL 5 MG PO TABS: 5.0000 mg | ORAL_TABLET | Freq: Once | ORAL | Status: DC | PRN

## 2016-11-24 MED ORDER — OXYCODONE HCL 5 MG PO TABS
5.0000 mg | ORAL_TABLET | ORAL | Status: DC | PRN
  Administered 2016-11-24 – 2016-11-25 (×2): 5 mg via ORAL
  Filled 2016-11-24 (×2): qty 1

## 2016-11-24 MED ORDER — IOPAMIDOL (ISOVUE-300) INJECTION 61%
100.0000 mL | Freq: Once | INTRAVENOUS | Status: AC | PRN
  Administered 2016-11-24: 100 mL via INTRAVENOUS

## 2016-11-24 MED ORDER — DEXTROSE 5 % IV SOLN
2.0000 g | Freq: Once | INTRAVENOUS | Status: AC
  Administered 2016-11-24: 2 g via INTRAVENOUS
  Filled 2016-11-24: qty 2

## 2016-11-24 MED ORDER — LIDOCAINE 2% (20 MG/ML) 5 ML SYRINGE
INTRAMUSCULAR | Status: DC | PRN
  Administered 2016-11-24: 60 mg via INTRAVENOUS

## 2016-11-24 MED ORDER — BISOPROLOL-HYDROCHLOROTHIAZIDE 5-6.25 MG PO TABS
0.5000 | ORAL_TABLET | Freq: Every day | ORAL | Status: DC
  Administered 2016-11-25: 0.5 via ORAL
  Filled 2016-11-24: qty 0.5

## 2016-11-24 MED ORDER — SODIUM CHLORIDE 0.9 % IR SOLN
Status: DC | PRN
  Administered 2016-11-24: 1

## 2016-11-24 MED ORDER — FENTANYL CITRATE (PF) 250 MCG/5ML IJ SOLN
INTRAMUSCULAR | Status: AC
  Filled 2016-11-24: qty 5

## 2016-11-24 MED ORDER — SUGAMMADEX SODIUM 200 MG/2ML IV SOLN
INTRAVENOUS | Status: AC
  Filled 2016-11-24: qty 2

## 2016-11-24 MED ORDER — FENTANYL CITRATE (PF) 100 MCG/2ML IJ SOLN
INTRAMUSCULAR | Status: AC
  Administered 2016-11-24: 50 ug via INTRAVENOUS
  Filled 2016-11-24: qty 2

## 2016-11-24 MED ORDER — DEXAMETHASONE SODIUM PHOSPHATE 10 MG/ML IJ SOLN
INTRAMUSCULAR | Status: DC | PRN
  Administered 2016-11-24: 10 mg via INTRAVENOUS

## 2016-11-24 MED ORDER — OXYCODONE HCL 5 MG/5ML PO SOLN: 5.0000 mg | Freq: Once | ORAL | Status: DC | PRN

## 2016-11-24 MED ORDER — ACETAMINOPHEN 650 MG RE SUPP: 650.0000 mg | Freq: Four times a day (QID) | RECTAL | Status: DC | PRN

## 2016-11-24 MED ORDER — SUCCINYLCHOLINE CHLORIDE 200 MG/10ML IV SOSY
PREFILLED_SYRINGE | INTRAVENOUS | Status: DC | PRN
  Administered 2016-11-24: 100 mg via INTRAVENOUS

## 2016-11-24 MED ORDER — DEXAMETHASONE SODIUM PHOSPHATE 10 MG/ML IJ SOLN
INTRAMUSCULAR | Status: AC
  Filled 2016-11-24: qty 1

## 2016-11-24 MED ORDER — PHENYLEPHRINE HCL 10 MG/ML IJ SOLN
INTRAMUSCULAR | Status: DC | PRN
  Administered 2016-11-24: 20 ug/min via INTRAVENOUS

## 2016-11-24 MED ORDER — DEXTROSE 5 % IV SOLN
2.0000 g | INTRAVENOUS | Status: DC
  Filled 2016-11-24: qty 2

## 2016-11-24 MED ORDER — DIPHENHYDRAMINE HCL 12.5 MG/5ML PO ELIX: 12.5000 mg | ORAL_SOLUTION | Freq: Four times a day (QID) | ORAL | Status: DC | PRN

## 2016-11-24 MED ORDER — MIDAZOLAM HCL 2 MG/2ML IJ SOLN
INTRAMUSCULAR | Status: DC | PRN
  Administered 2016-11-24: 2 mg via INTRAVENOUS

## 2016-11-24 MED ORDER — ONDANSETRON HCL 4 MG/2ML IJ SOLN
INTRAMUSCULAR | Status: DC | PRN
  Administered 2016-11-24: 4 mg via INTRAVENOUS

## 2016-11-24 MED ORDER — ONDANSETRON 4 MG PO TBDP: 4.0000 mg | ORAL_TABLET | Freq: Four times a day (QID) | ORAL | Status: DC | PRN

## 2016-11-24 MED ORDER — BUPROPION HCL ER (XL) 150 MG PO TB24
150.0000 mg | ORAL_TABLET | Freq: Every day | ORAL | Status: DC
  Administered 2016-11-25: 150 mg via ORAL
  Filled 2016-11-24: qty 1

## 2016-11-24 MED ORDER — PHENYLEPHRINE 40 MCG/ML (10ML) SYRINGE FOR IV PUSH (FOR BLOOD PRESSURE SUPPORT)
PREFILLED_SYRINGE | INTRAVENOUS | Status: DC | PRN
  Administered 2016-11-24: 80 ug via INTRAVENOUS

## 2016-11-24 MED ORDER — BUPIVACAINE HCL (PF) 0.25 % IJ SOLN
INTRAMUSCULAR | Status: AC
  Filled 2016-11-24: qty 30

## 2016-11-24 MED ORDER — METRONIDAZOLE 500 MG PO TABS: 500.0000 mg | ORAL_TABLET | Freq: Three times a day (TID) | ORAL | 0 refills | Status: DC

## 2016-11-24 MED ORDER — MORPHINE SULFATE (PF) 4 MG/ML IV SOLN
4.0000 mg | INTRAVENOUS | Status: DC | PRN
Start: 2016-11-24 — End: 2016-11-24

## 2016-11-24 MED ORDER — ACETAMINOPHEN 325 MG PO TABS: 650.0000 mg | ORAL_TABLET | Freq: Four times a day (QID) | ORAL | Status: DC | PRN

## 2016-11-24 MED ORDER — FAMOTIDINE 20 MG PO TABS
20.0000 mg | ORAL_TABLET | Freq: Every day | ORAL | Status: DC
  Administered 2016-11-25: 20 mg via ORAL
  Filled 2016-11-24: qty 1

## 2016-11-24 MED ORDER — PROPOFOL 10 MG/ML IV BOLUS
INTRAVENOUS | Status: DC | PRN
  Administered 2016-11-24: 160 mg via INTRAVENOUS

## 2016-11-24 MED ORDER — BUPIVACAINE-EPINEPHRINE (PF) 0.25% -1:200000 IJ SOLN
INTRAMUSCULAR | Status: AC
  Filled 2016-11-24: qty 30

## 2016-11-24 MED ORDER — CIPROFLOXACIN HCL 500 MG PO TABS: 500.0000 mg | ORAL_TABLET | Freq: Two times a day (BID) | ORAL | 0 refills | Status: DC

## 2016-11-24 MED ORDER — FENTANYL CITRATE (PF) 100 MCG/2ML IJ SOLN
INTRAMUSCULAR | Status: DC | PRN
  Administered 2016-11-24: 50 ug via INTRAVENOUS
  Administered 2016-11-24: 100 ug via INTRAVENOUS

## 2016-11-24 MED ORDER — SUCCINYLCHOLINE CHLORIDE 200 MG/10ML IV SOSY
PREFILLED_SYRINGE | INTRAVENOUS | Status: AC
  Filled 2016-11-24: qty 10

## 2016-11-24 SURGICAL SUPPLY — 45 items
ADH SKN CLS APL DERMABOND .7 (GAUZE/BANDAGES/DRESSINGS) ×1
APPLIER CLIP 5 13 M/L LIGAMAX5 (MISCELLANEOUS)
APR CLP MED LRG 5 ANG JAW (MISCELLANEOUS)
BAG RETRIEVAL 10 (BASKET) ×1
BAG RETRIEVAL 10MM (BASKET) ×1
BLADE CLIPPER SURG (BLADE) IMPLANT
CANISTER SUCT 3000ML PPV (MISCELLANEOUS) ×3 IMPLANT
CHLORAPREP W/TINT 26ML (MISCELLANEOUS) ×3 IMPLANT
CLIP APPLIE 5 13 M/L LIGAMAX5 (MISCELLANEOUS) IMPLANT
COVER SURGICAL LIGHT HANDLE (MISCELLANEOUS) ×3 IMPLANT
CUTTER ENDO LINEAR 45M (STAPLE) ×3 IMPLANT
DERMABOND ADVANCED (GAUZE/BANDAGES/DRESSINGS) ×2
DERMABOND ADVANCED .7 DNX12 (GAUZE/BANDAGES/DRESSINGS) ×1 IMPLANT
DEVICE PMI PUNCTURE CLOSURE (MISCELLANEOUS) ×3 IMPLANT
ELECT REM PT RETURN 9FT ADLT (ELECTROSURGICAL) ×3
ELECTRODE REM PT RTRN 9FT ADLT (ELECTROSURGICAL) ×1 IMPLANT
GLOVE BIO SURGEON STRL SZ 6 (GLOVE) ×3 IMPLANT
GLOVE BIOGEL PI IND STRL 6.5 (GLOVE) ×1 IMPLANT
GLOVE BIOGEL PI INDICATOR 6.5 (GLOVE) ×2
GOWN STRL REUS W/ TWL LRG LVL3 (GOWN DISPOSABLE) ×2 IMPLANT
GOWN STRL REUS W/TWL LRG LVL3 (GOWN DISPOSABLE) ×6
KIT BASIN OR (CUSTOM PROCEDURE TRAY) ×3 IMPLANT
KIT ROOM TURNOVER OR (KITS) ×3 IMPLANT
NDL INSUFFLATION 14GA 120MM (NEEDLE) ×1 IMPLANT
NEEDLE INSUFFLATION 14GA 120MM (NEEDLE) ×3 IMPLANT
NS IRRIG 1000ML POUR BTL (IV SOLUTION) ×3 IMPLANT
PAD ARMBOARD 7.5X6 YLW CONV (MISCELLANEOUS) ×6 IMPLANT
RELOAD 45 VASCULAR/THIN (ENDOMECHANICALS) IMPLANT
RELOAD STAPLE 45 2.5 WHT GRN (ENDOMECHANICALS) IMPLANT
RELOAD STAPLE 45 3.5 BLU ETS (ENDOMECHANICALS) IMPLANT
RELOAD STAPLE TA45 3.5 REG BLU (ENDOMECHANICALS) IMPLANT
SCISSORS LAP 5X35 DISP (ENDOMECHANICALS) IMPLANT
SET IRRIG TUBING LAPAROSCOPIC (IRRIGATION / IRRIGATOR) ×3 IMPLANT
SHEARS HARMONIC ACE PLUS 36CM (ENDOMECHANICALS) IMPLANT
SLEEVE ENDOPATH XCEL 5M (ENDOMECHANICALS) ×3 IMPLANT
SPECIMEN JAR SMALL (MISCELLANEOUS) ×3 IMPLANT
SUT MNCRL AB 4-0 PS2 18 (SUTURE) ×3 IMPLANT
SYS BAG RETRIEVAL 10MM (BASKET) ×1
SYSTEM BAG RETRIEVAL 10MM (BASKET) ×1 IMPLANT
TOWEL OR 17X24 6PK STRL BLUE (TOWEL DISPOSABLE) ×3 IMPLANT
TRAY FOLEY CATH SILVER 16FR (SET/KITS/TRAYS/PACK) ×3 IMPLANT
TRAY LAPAROSCOPIC MC (CUSTOM PROCEDURE TRAY) ×3 IMPLANT
TROCAR BLADELESS 12MM (ENDOMECHANICALS) ×3 IMPLANT
TROCAR XCEL NON-BLD 5MMX100MML (ENDOMECHANICALS) ×3 IMPLANT
TUBING INSUFFLATION (TUBING) ×3 IMPLANT

## 2016-11-24 NOTE — Patient Instructions (Signed)
Abdominal Pain, Adult Abdominal pain can be caused by many things. Often, abdominal pain is not serious and it gets better with no treatment or by being treated at home. However, sometimes abdominal pain is serious. Your health care provider will do a medical history and a physical exam to try to determine the cause of your abdominal pain. Follow these instructions at home:  Take over-the-counter and prescription medicines only as told by your health care provider. Do not take a laxative unless told by your health care provider.  Drink enough fluid to keep your urine clear or pale yellow.  Watch your condition for any changes.  Keep all follow-up visits as told by your health care provider. This is important. Contact a health care provider if:  Your abdominal pain changes or gets worse.  You are not hungry or you lose weight without trying.  You are constipated or have diarrhea for more than 2-3 days.  You have pain when you urinate or have a bowel movement.  Your abdominal pain wakes you up at night.  Your pain gets worse with meals, after eating, or with certain foods.  You are throwing up and cannot keep anything down.  You have a fever. Get help right away if:  Your pain does not go away as soon as your health care provider told you to expect.  You cannot stop throwing up.  Your pain is only in areas of the abdomen, such as the right side or the left lower portion of the abdomen.  You have bloody or black stools, or stools that look like tar.  You have severe pain, cramping, or bloating in your abdomen.  You have signs of dehydration, such as: ? Dark urine, very little urine, or no urine. ? Cracked lips. ? Dry mouth. ? Sunken eyes. ? Sleepiness. ? Weakness. This information is not intended to replace advice given to you by your health care provider. Make sure you discuss any questions you have with your health care provider. Document Released: 03/19/2005 Document  Revised: 12/28/2015 Document Reviewed: 11/21/2015 Elsevier Interactive Patient Education  2017 [place] of Breath, Adult Shortness of breath is when a person has trouble breathing enough air, or when a person feels like she or he is having trouble breathing in enough air. Shortness of breath could be a sign of medical problem. Follow these instructions at home: Pay attention to any changes in your symptoms. Take these actions to help with your condition:  Do not smoke. Smoking is a common cause of shortness of breath. If you smoke and you need help quitting, ask your health care provider.  Avoid things that can irritate your airways, such as: ? Mold. ? Dust. ? Air pollution. ? Chemical fumes. ? Things that can cause allergy symptoms (allergens), if you have allergies.  Keep your living space clean and free of mold and dust.  Rest as needed. Slowly return to your usual activities.  Take over-the-counter and prescription medicines, including oxygen and inhaled medicines, only as told by your health care provider.  Keep all follow-up visits as told by your health care provider. This is important.  Contact a health care provider if:  Your condition does not improve as soon as expected.  You have a hard time doing your normal activities, even after you rest.  You have new symptoms. Get help right away if:  Your shortness of breath gets worse.  You have shortness of breath when you are resting.  You feel light-headed or you faint.  You have a cough that is not controlled with medicines.  You cough up blood.  You have pain with breathing.  You have pain in your chest, arms, shoulders, or abdomen.  You have a fever.  You cannot walk up stairs or exercise the way that you normally do. This information is not intended to replace advice given to you by your health care provider. Make sure you discuss any questions you have with your health care  provider. Document Released: 03/04/2001 Document Revised: 12/29/2015 Document Reviewed: 11/15/2015 Elsevier Interactive Patient Education  [employer].

## 2016-11-24 NOTE — Anesthesia Preprocedure Evaluation (Signed)
Anesthesia Evaluation  Patient identified by MRN, date of birth, ID band Patient awake    Reviewed: Allergy & Precautions, H&P , NPO status , Patient's Chart, lab work & pertinent test results  Airway Mallampati: II   Neck ROM: full    Dental   Pulmonary COPD, former smoker,    breath sounds clear to auscultation       Cardiovascular hypertension, + Peripheral Vascular Disease   Rhythm:regular Rate:Normal     Neuro/Psych  Neuromuscular disease    GI/Hepatic GERD  ,  Endo/Other    Renal/GU Renal InsufficiencyRenal disease     Musculoskeletal   Abdominal   Peds  Hematology   Anesthesia Other Findings   Reproductive/Obstetrics                             Anesthesia Physical Anesthesia Plan  ASA: III  Anesthesia Plan: General   Post-op Pain Management:    Induction: Intravenous  PONV Risk Score and Plan: 4 or greater and Ondansetron, Dexamethasone, Propofol and Midazolam  Airway Management Planned:   Additional Equipment:   Intra-op Plan:   Post-operative Plan: Extubation in OR  Informed Consent: I have reviewed the patients History and Physical, chart, labs and discussed the procedure including the risks, benefits and alternatives for the proposed anesthesia with the patient or authorized representative who has indicated his/her understanding and acceptance.     Plan Discussed with: CRNA, Anesthesiologist and Surgeon  Anesthesia Plan Comments:         Anesthesia Quick Evaluation

## 2016-11-24 NOTE — Op Note (Signed)
Operative Report  MANOJ ENRIQUEZ 66 y.o. male  [id number]  [id number]  11/24/2016  Surgeon: Clovis Riley   Assistant: none  Procedure performed: Laparoscopic Appendectomy  Preop diagnosis: Acute appendicitis  Post-op diagnosis/intraop findings: Acute appendicitis  Specimens: appendix  EBL: 29UT  Complications: none  Description of procedure: After obtaining informed consent the patient was brought to the operating room. Antibiotics and subcutaneous heparin were administered. SCD's were applied. General endotracheal anesthesia was initiated and a formal time-out was performed. The abdomen was prepped and draped in the usual sterile fashion and the abdomen was entered via a infraumbilical incision and blunt dissection through the small umbilical hernia sac, a 65YY trocar placed and insufflated to 15 mmHg. The abdomen was inspected and there is no evidence of injury from our entry. A suprapubic 5 mm trocar and a left lower quadrant 5 mm trocar were introduced under direct visualization following infiltration with local. The patient was then placed in Trendelenburg and rotated to the left and the small bowel was reflected cephalad. The appendix was visualized: it was acutely inflamed with scant murky periappendiceal fluid. The appendix was partially retrocecal. A combination of blunt dissection and hook electrocautery were used to free it of its retroperitoneal attachments. Great care was taken to ensure no injury to surrounding retroperitoneal structures, cecum or terminal ileum. A window was created at the base of the appendix and a blue load linear cutting stapler was used to transect the appendix from the cecum. The harmonic scalpel was then used to transect the appendiceal mesentery. Some bleeding from the appendiceal artery was controlled with further harmonic. The appendix was placed in an Endo Catch bag and removed through our 12 mm trocar site. The right lower quadrant was  inspected and irrigated. The staple line on the cecum was intact and viable. Hemostasis was confirmed. The omentum was directed to lie over the right lower quadrant. The umbilical fascia was closed with a figure-of-eight 0 vicryl and the closure was inspected laparoscopically and found to be airtight and free of any entrapped structures. The abdomen was desufflated and all trocars removed. The skin incisions were closed with running subcuticular monocryl and Dermabond. The patient was awakened, extubated and transported to the recovery room in stable condition.   All counts were correct at the completion of the case.

## 2016-11-24 NOTE — Progress Notes (Signed)
Subjective:    Patient ID: [name] Park, male    DOB: 07/02/1950, 66 y.o.   MRN: [id number]  HPI 65 y.o. WM presents with AB pain x June 02  States he was eating an apple and grilled chicken salad on Saturday when he had AB pain/cramping, went and had diarrhea, felt better until 3 AM, had bilateral AB pain no matter what position, chills, off and on until 6-7 AM, still had pain but not has bad. Started to drink Gatorade and water, jello, feels better but still some tenderness/pain. Some nausea but no vomiting. Wife at same salad without issues. No dark stools, black stool. Last colonoscopy 02/2016.  Has been on diclofenac for back pain x 1 month, stopped 3 weeks ago, still on zantac 150mg .   He also complains of bilateral leg swelling x 1-2 weeks. No change in medication. Weight is down. He has had some shortness of breath with exertion x 1-2 weeks. He sleeps on his side, no orthopnea, no PND, no cough, wheezing, sputum production. He has history of COPD.  Wt Readings from Last 3 Encounters:  11/24/16 190 lb (86.2 kg)  08/11/16 189 lb 6.4 oz (85.9 kg)  05/07/16 192 lb (87.1 kg)    Blood pressure 116/70, pulse 72, temperature 97.3 F (36.3 C), resp. rate 16, height 5\' 8"  (1.727 m), weight 190 lb (86.2 kg).  Medications Current Outpatient Prescriptions on File Prior to Visit  Medication Sig  . aspirin 81 MG tablet Take 81 mg by mouth every other day.   Marland Kitchen atorvastatin (LIPITOR) 40 MG tablet Take 1 tablet by mouth  daily  . bisoprolol-hydrochlorothiazide (ZIAC) 5-6.25 MG tablet TAKE 1 TABLET EVERY MORNING FOR BP  . buPROPion (WELLBUTRIN XL) 150 MG 24 hr tablet TAKE 1 TABLET BY MOUTH  DAILY  . Cholecalciferol (VITAMIN D-3) 1000 UNITS CAPS Take 2,000 Units by mouth daily.  . ranitidine (ZANTAC) 150 MG tablet TAKE 1 TABLET BY MOUTH TWO  TIMES DAILY  . VESICARE 10 MG tablet TAKE 1 TABLET (10 MG TOTAL) BY MOUTH DAILY.   Current Facility-Administered Medications on File Prior to Visit   Medication  . 0.9 %  sodium chloride infusion    Problem list He has Vitamin D deficiency; Essential hypertension; Prediabetes; Medication management; GERD ; OAB (overactive bladder); Diverticulitis of colon without hemorrhage; Hyperlipidemia; COPD (chronic obstructive pulmonary disease) with chronic bronchitis ([place]); Generalized anxiety disorder; Encounter for general adult medical examination with abnormal findings; and Thoracic aorta atherosclerosis ([place]) on his problem list.   Review of Systems  Constitutional: Positive for appetite change and chills. Negative for activity change, diaphoresis, fatigue, fever and unexpected weight change.  HENT: Negative.   Respiratory: Positive for shortness of breath. Negative for apnea, cough, choking, chest tightness, wheezing and stridor.   Cardiovascular: Positive for leg swelling. Negative for chest pain and palpitations.  Gastrointestinal: Positive for abdominal pain, diarrhea and nausea. Negative for abdominal distention, anal bleeding, blood in stool, constipation, rectal pain and vomiting.  Genitourinary: Negative.  Negative for difficulty urinating, flank pain and frequency.  Musculoskeletal: Negative.   Neurological: Negative.   Psychiatric/Behavioral: Negative.        Objective:   Physical Exam  Constitutional: He is oriented to person, place, and time. He appears well-developed and well-nourished.  HENT:  Head: Normocephalic and atraumatic.  Eyes: Conjunctivae are normal. Pupils are equal, round, and reactive to light. No scleral icterus.  Neck: Normal range of motion. Neck supple.  Cardiovascular: Normal rate, regular rhythm  and normal heart sounds.   No murmur heard. Pulmonary/Chest: Effort normal and breath sounds normal. No respiratory distress. He has no wheezes. He has no rales.  Abdominal: Soft. He exhibits no ascites and no mass. Bowel sounds are decreased. There is no hepatosplenomegaly. There is tenderness (negative  psoas/obturator) in the right lower quadrant. There is rebound and guarding. There is no rigidity, no CVA tenderness, no tenderness at McBurney's point and negative Murphy's sign. No hernia.  Musculoskeletal: Normal range of motion. He exhibits edema (1-2+ edema bilateral legs).  Neurological: He is alert and oriented to person, place, and time. He has normal reflexes. No cranial nerve deficit.  Skin: Skin is warm and dry. No rash noted.      Assessment & Plan:   Right lower quadrant abdominal tenderness with rebound tenderness with chills and diarrhea, likely diverticulitis, rule out appendicitis, perforation/abscess Bland foods, if worse go to ER STAT CT scan -     CT Abdomen Pelvis W Contrast; Future -     CT Abdomen Pelvis Wo Contrast; Future -     Lactic acid, plasma -     metroNIDAZOLE (FLAGYL) 500 MG tablet; Take 1 tablet (500 mg total) by mouth 3 (three) times daily. -     ciprofloxacin (CIPRO) 500 MG tablet; Take 1 tablet (500 mg total) by mouth 2 (two) times daily.  Dyspnea, unspecified type with edema Weight is stable, no CP, no accompaniments, no PND, + history of COPD Check labs If any worsening symptoms, any CP/SOB go to ER -     CBC with Differential/Platelet -     BASIC METABOLIC PANEL WITH GFR -     Hepatic function panel -     TSH -     Brain natriuretic peptide   The patient was advised to call immediately if he has any concerning symptoms in the interval. The patient voices understanding of current treatment options and is in agreement with the current care plan.The patient knows to call the clinic with any problems, questions or concerns or go to the ER if any further progression of symptoms.

## 2016-11-24 NOTE — ED Notes (Signed)
Patient states he does not want pain medication right now as his pain is "hardly there" and denies nausea. Explained to patient to inform RN if pain increases.

## 2016-11-24 NOTE — ED Provider Notes (Signed)
[facility] DEPT Provider Note   CSN: [id number] Arrival date & time: 11/24/16  1435     History   Chief Complaint Chief Complaint  Patient presents with  . Abdominal Pain    HPI [name] Park is a 66 y.o. male.  HPI  The pt is a 66 y/o male, no prior surgical history, preasents with 36 hours of RlQ pain.  Has had nausea and LOA with chills yesterday.  No fevers, no vomiting, no changes in bowel habits, urinary habits.  Has had no food today - last ate last night.  He was sent to the hospital from radiology after he was told that he had appendicitis on the CT scan. The patient rates his current pain is 3 out of 10.  Past Medical History:  Diagnosis Date  . COPD (chronic obstructive pulmonary disease) ([place])   . GERD (gastroesophageal reflux disease)   . Hematuria   . HLD (hyperlipidemia)   . Hypertension   . IBS (irritable bowel syndrome)   . Pre-diabetes   . Vitamin D deficiency     Patient Active Problem List   Diagnosis Date Noted  . Acute appendicitis 11/24/2016  . Thoracic aorta atherosclerosis ([place]) 08/11/2016  . Encounter for general adult medical examination with abnormal findings 02/05/2016  . COPD (chronic obstructive pulmonary disease) with chronic bronchitis ([place]) 09/07/2014  . Generalized anxiety disorder 09/07/2014  . Hyperlipidemia 06/08/2014  . GERD  03/08/2014  . OAB (overactive bladder) 03/08/2014  . Diverticulitis of colon without hemorrhage 03/08/2014  . Essential hypertension 12/05/2013  . Prediabetes 12/05/2013  . Medication management 12/05/2013  . Vitamin D deficiency 06/21/2013    Past Surgical History:  Procedure Laterality Date  . CARPAL TUNNEL RELEASE Right   . CERVICAL FUSION    . HAND SURGERY Left   . TRIGGER FINGER RELEASE Right    thumb       Home Medications    Prior to Admission medications   Medication Sig Start Date End Date Taking? Authorizing Provider  aspirin EC 81 MG tablet Take 81 mg by mouth daily. Take 1  tablet (81 mg) by mouth 3 times week - Sunday, Wednesday and Friday - with lunch   Yes [provider]  atorvastatin (LIPITOR) 40 MG tablet Take 1 tablet by mouth  daily Patient taking differently: TAKE 1 TABLET (40 MG) BY MOUTH THREE TIMES WEEK AT BEDTIME - TUESDAY, [place], [place] 02/21/16  Yes Unk Pinto, MD  bisoprolol-hydrochlorothiazide (ZIAC) 5-6.25 MG tablet TAKE 1 TABLET EVERY MORNING FOR BP Patient taking differently: TAKE 1/2 TABLET BY MOUTH EVERY MORNING FOR BP 10/12/16  Yes Unk Pinto, MD  buPROPion (WELLBUTRIN XL) 150 MG 24 hr tablet TAKE 1 TABLET BY MOUTH  DAILY Patient taking differently: TAKE 1 TABLET BY MOUTH  DAILY AFTER LUNCH 07/28/16  Yes Unk Pinto, MD  Cholecalciferol (VITAMIN D-3) 1000 UNITS CAPS Take 2,000 Units by mouth daily.   Yes [provider]  hydrocortisone 2.5 % lotion Apply 1 application topically daily as needed (scalp itching).  09/24/16  Yes [provider]  ranitidine (ZANTAC) 150 MG tablet TAKE 1 TABLET BY MOUTH TWO  TIMES DAILY 11/10/16  Yes Vicie Mutters, PA-C  VESICARE 10 MG tablet TAKE 1 TABLET (10 MG TOTAL) BY MOUTH DAILY. Patient taking differently: Take 5 mg by mouth daily.  08/04/16  Yes Vicie Mutters, PA-C  ciprofloxacin (CIPRO) 500 MG tablet Take 1 tablet (500 mg total) by mouth 2 (two) times daily. Patient taking differently: Take 500  mg by mouth 2 (two) times daily. 7 day course prescribed 11/24/16 11/24/16 12/01/16  Vicie Mutters, PA-C  metroNIDAZOLE (FLAGYL) 500 MG tablet Take 1 tablet (500 mg total) by mouth 3 (three) times daily. Patient taking differently: Take 500 mg by mouth 3 (three) times daily. 7 day course prescribed 11/24/16 11/24/16 12/01/16  Vicie Mutters, PA-C    Family History Family History  Problem Relation Age of Onset  . Irritable bowel syndrome Mother   . Diverticulosis Mother   . Lupus Mother   . COPD Mother   . Hypertension Mother   . Lung cancer Father   . Other Father         bladder polyps  . Stroke Father   . Kidney disease Father   . Cancer Father   . Colon cancer Neg Hx   . Esophageal cancer Neg Hx   . Rectal cancer Neg Hx   . Stomach cancer Neg Hx     Social History Social History  Substance Use Topics  . Smoking status: Former Smoker    Packs/day: 1.50    Years: 35.00    Types: Cigarettes    Quit date: 02/05/2004  . Smokeless tobacco: Never Used  . Alcohol use Yes     Comment: seldom     Allergies   Patient has no known allergies.   Review of Systems Review of Systems  All other systems reviewed and are negative.    Physical Exam Updated Vital Signs BP (!) 145/75 (BP Location: Left Arm)   Pulse (!) 102   Temp 98.6 F (37 C) (Oral)   Resp 19   Ht 5\' 8"  (1.727 m)   Wt 86.2 kg (190 lb 0.6 oz)   SpO2 98%   BMI 28.89 kg/m   Physical Exam  Constitutional: He appears well-developed and well-nourished. No distress.  HENT:  Head: Normocephalic and atraumatic.  Mouth/Throat: Oropharynx is clear and moist. No oropharyngeal exudate.  Eyes: Conjunctivae and EOM are normal. Pupils are equal, round, and reactive to light. Right eye exhibits no discharge. Left eye exhibits no discharge. No scleral icterus.  Neck: Normal range of motion. Neck supple. No JVD present. No thyromegaly present.  Cardiovascular: Normal rate, regular rhythm, normal heart sounds and intact distal pulses.  Exam reveals no gallop and no friction rub.   No murmur heard. Pulmonary/Chest: Effort normal and breath sounds normal. No respiratory distress. He has no wheezes. He has no rales.  Abdominal: Soft. Bowel sounds are normal. He exhibits no distension and no mass. There is tenderness ( Tender to palpation in the right lower quadrant, mild Rovsing sign, no peritoneal signs does not have acute abdomen).  Musculoskeletal: Normal range of motion. He exhibits no edema or tenderness.  Lymphadenopathy:    He has no cervical adenopathy.  Neurological: He is alert.  Coordination normal.  Skin: Skin is warm and dry. No rash noted. No erythema.  Psychiatric: He has a normal mood and affect. His behavior is normal.  Nursing note and vitals reviewed.    ED Treatments / Results  Labs (all labs ordered are listed, but only abnormal results are displayed) Labs Reviewed  COMPREHENSIVE METABOLIC PANEL - Abnormal; Notable for the following:       Result Value   Chloride 99 (*)    Creatinine, Ser 1.54 (*)    Total Bilirubin 1.4 (*)    GFR calc non Af Amer 45 (*)    GFR calc Af Amer 52 (*)    All  other components within normal limits  CBC - Abnormal; Notable for the following:    HCT 38.9 (*)    All other components within normal limits  URINALYSIS, ROUTINE W REFLEX MICROSCOPIC - Abnormal; Notable for the following:    Color, Urine COLORLESS (*)    Ketones, ur 5 (*)    All other components within normal limits  BASIC METABOLIC PANEL - Abnormal; Notable for the following:    Sodium 132 (*)    Chloride 100 (*)    Glucose, Bld 150 (*)    Creatinine, Ser 1.40 (*)    Calcium 8.5 (*)    GFR calc non Af Amer 50 (*)    GFR calc Af Amer 59 (*)    All other components within normal limits  CBC - Abnormal; Notable for the following:    RBC 3.96 (*)    Hemoglobin 12.1 (*)    HCT 36.1 (*)    All other components within normal limits  LIPASE, BLOOD  TYPE AND SCREEN  ABO/RH  SURGICAL PATHOLOGY    EKG  EKG Interpretation None       Radiology Ct Abdomen Pelvis W Contrast  Result Date: 11/24/2016 CLINICAL DATA:  Right lower quadrant abdominal pain and tenderness with repair and tenderness for 2 days. Fever and chills. EXAM: CT ABDOMEN AND PELVIS WITH CONTRAST TECHNIQUE: Multidetector CT imaging of the abdomen and pelvis was performed using the standard protocol following bolus administration of intravenous contrast. Creatinine was obtained on site at [facility] at [address] Results: Creatinine 1.3 mg/dL. CONTRAST:  142mL ISOVUE-300  IOPAMIDOL (ISOVUE-300) INJECTION 61% COMPARISON:  11/19/2005 CT abdomen/ pelvis. FINDINGS: Lower chest: No significant pulmonary nodules or acute consolidative airspace disease. Hepatobiliary: Normal liver with no liver mass. Normal gallbladder with no radiopaque cholelithiasis. No biliary ductal dilatation. Pancreas: Heterogeneous fatty infiltration of the pancreas. No pancreatic mass or duct dilation. Spleen: Normal size. No mass. Adrenals/Urinary Tract: No discrete adrenal nodules. No hydronephrosis. Subcentimeter hypodense renal cortical lesions in the upper right and lower left kidneys, too small to characterize, for which no further follow-up is required. Normal bladder. Stomach/Bowel: Grossly normal stomach. Normal caliber small bowel with no definite small bowel wall thickening. Appendix measures 9 mm diameter, minimally increased from 7 mm on 11/19/2005. There is borderline mild appendiceal wall thickening and hyperenhancement in the proximal appendix without significant periappendiceal fat stranding. No large bowel wall thickening, significant diverticulosis or significant pericolonic fat stranding. Vascular/Lymphatic: Atherosclerotic nonaneurysmal abdominal aorta. Patent portal, splenic, hepatic and renal veins. No pathologically enlarged lymph nodes in the abdomen or pelvis. Reproductive: Top-normal size prostate. Other: No pneumoperitoneum, ascites or focal fluid collection. Small fat containing umbilical hernia is increased in size. Symmetric mild gynecomastia. Musculoskeletal: No aggressive appearing focal osseous lesions. Mild thoracolumbar spondylosis. IMPRESSION: 1. CT findings are equivocal for early acute appendicitis. Mildly dilated appendix, slightly increased in caliber compared to a 2007 CT study. Borderline mild appendiceal wall thickening and hyperenhancement. Surgical consultation advised. 2. No evidence of bowel obstruction, perforation or abscess. 3. Small fat containing umbilical  hernia. 4. Aortic atherosclerosis. These results will be called to the ordering clinician or representative by the Radiologist Assistant, and communication documented in the PACS or zVision Dashboard. Electronically Signed   By: Ilona Sorrel M.D.   On: 11/24/2016 14:08    Procedures Procedures (including critical care time)  Medications Ordered in ED Medications  ondansetron (ZOFRAN) injection 4 mg (4 mg Intravenous Given 11/25/16 0606)  heparin injection 5,000 Units (  Subcutaneous MAR Unhold 11/24/16 2103)  0.9 %  sodium chloride infusion ( Intravenous New Bag/Given 11/25/16 0648)  cefTRIAXone (ROCEPHIN) 2 g in dextrose 5 % 50 mL IVPB ( Intravenous MAR Unhold 11/24/16 2103)    And  metroNIDAZOLE (FLAGYL) IVPB 500 mg (500 mg Intravenous New Bag/Given 11/25/16 0900)  acetaminophen (TYLENOL) tablet 650 mg ( Oral MAR Unhold 11/24/16 2103)    Or  acetaminophen (TYLENOL) suppository 650 mg ( Rectal MAR Unhold 11/24/16 2103)  oxyCODONE (Oxy IR/ROXICODONE) immediate release tablet 5-10 mg (5 mg Oral Given 11/25/16 0606)  HYDROmorphone (DILAUDID) injection 0.5 mg ( Intravenous MAR Unhold 11/24/16 2103)  diphenhydrAMINE (BENADRYL) 12.5 MG/5ML elixir 12.5 mg ( Oral MAR Unhold 11/24/16 2103)    Or  diphenhydrAMINE (BENADRYL) injection 12.5 mg ( Intravenous MAR Unhold 11/24/16 2103)  docusate sodium (COLACE) capsule 100 mg (100 mg Oral Given 11/25/16 0823)  ondansetron (ZOFRAN-ODT) disintegrating tablet 4 mg ( Oral MAR Unhold 11/24/16 2103)  famotidine (PEPCID) tablet 20 mg (20 mg Oral Given 11/25/16 0823)  buPROPion (WELLBUTRIN XL) 24 hr tablet 150 mg (150 mg Oral Given 11/25/16 0823)  bisoprolol-hydrochlorothiazide (ZIAC) 5-6.25 MG per tablet 0.5 tablet (0.5 tablets Oral Given 11/25/16 0823)  fentaNYL (SUBLIMAZE) 100 MCG/2ML injection (not administered)  alum & mag hydroxide-simeth (MAALOX/MYLANTA) 200-200-20 MG/5ML suspension 30 mL (not administered)  cefTRIAXone (ROCEPHIN) 2 g in dextrose 5 % 50 mL IVPB (0 g Intravenous  Stopped 11/24/16 1707)    And  metroNIDAZOLE (FLAGYL) IVPB 500 mg (500 mg Intravenous New Bag/Given 11/24/16 1708)  sodium chloride 0.9 % bolus 1,000 mL (0 mLs Intravenous Stopped 11/24/16 1707)     Initial Impression / Assessment and Plan / ED Course  I have reviewed the triage vital signs and the nursing notes.  Pertinent labs & imaging results that were available during my care of the patient were reviewed by me and considered in my medical decision making (see chart for details).  Clinical Course as of Nov 25 1009  Mon Nov 24, 2016  1624 Discussed with OR Nurse to Dr. Hulen Skains currently operating.  Message given.  [BM]    Clinical Course User Index [BM] Noemi Chapel, MD   The patient is well-appearing but does have tenderness over the appendix, CT scan confirms a dilated appendix consistent with acute appendicitis. Will discussed with general surgery.   Morphine and zofran PRN, Fluids  Stable appaering  Final Clinical Impressions(s) / ED Diagnoses   Final diagnoses:  Other acute appendicitis    New Prescriptions Current Discharge Medication List       Noemi Chapel, MD 11/25/16 1011

## 2016-11-24 NOTE — Anesthesia Procedure Notes (Signed)

## 2016-11-24 NOTE — ED Triage Notes (Signed)
Pt reports onset Saturday of RLQ. Denies n/v or fever. Pt went for outpatient CT scan and sent here for + appendicitis.

## 2016-11-24 NOTE — Transfer of Care (Signed)
Immediate Anesthesia Transfer of Care Note  Patient: [name] Park  Procedure(s) Performed: Procedure(s): APPENDECTOMY LAPAROSCOPIC (N/A)  Patient Location: PACU  Anesthesia Type:General  Level of Consciousness: awake, alert , oriented and patient cooperative  Airway & Oxygen Therapy: Patient Spontanous Breathing  Post-op Assessment: Report given to RN and Post -op Vital signs reviewed and stable  Post vital signs: Reviewed and stable  Last Vitals:  Vitals:   11/24/16 1730 11/24/16 1745  BP: (!) 141/72 129/63  Pulse: 77 78  Resp:    Temp:      Last Pain:  Vitals:   11/24/16 1745  TempSrc:   PainSc: 2          Complications: No apparent anesthesia complications

## 2016-11-24 NOTE — ED Notes (Signed)
Pt stated that he went to the bathroom, prior to coming back to the room. Pt will try later.

## 2016-11-24 NOTE — H&P (Signed)
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 He was eating an apple with peanutbutter on Saturday evening when the pain began. It was in the general lower abdomen and began as a dull colicky pain. He had diarrhea that evening with some relief of the pain. Denies melena or hematochezia. He awoke with pain again around 1:30 Sunday morning. This pain remained general and was on and off. He was able to eat some jello but does endorse nausea. No emesis. He endorses feeling cold and dehydrated but no fevers. The pain did gradually localize and become more sharp on the right side. He went to see his PCP this morning and was sent for a CT with the results copied below- possible early appendicitis. He was then sent to ER for further eval. His pain is minimal at present.  He has never had abdominal surgery Last colonoscopy was in October last year and was negative per patient Has hx of IBS and has had an episode of diverticulitis in the past.   No Known Allergies  Past Medical History:  Diagnosis Date  . COPD (chronic obstructive pulmonary disease) ([place])   . GERD (gastroesophageal reflux disease)   . Hematuria   . HLD (hyperlipidemia)   . Hypertension   . IBS (irritable bowel syndrome)   . Pre-diabetes   . Vitamin D deficiency     Past Surgical History:  Procedure Laterality Date  . CARPAL TUNNEL RELEASE Right   . CERVICAL FUSION    . HAND SURGERY Left   . TRIGGER FINGER RELEASE Right    thumb    Family History  Problem Relation Age of Onset  . Irritable bowel syndrome Mother   . Diverticulosis Mother   . Lupus Mother   . COPD Mother   . Hypertension Mother   . Lung cancer Father   . Other Father        bladder polyps  . Stroke Father   . Kidney disease Father   . Cancer Father   . Colon cancer Neg Hx   . Esophageal cancer Neg Hx   . Rectal cancer  Neg Hx   . Stomach cancer Neg Hx     Social History   Social History  . Marital status: Married    Spouse name: N/A  . Number of children: 1  . Years of education: N/A   Occupational History  . retired    Social History Main Topics  . Smoking status: Former Smoker    Packs/day: 1.50    Years: 35.00    Types: Cigarettes    Quit date: 02/05/2004  . Smokeless tobacco: Never Used  . Alcohol use Yes     Comment: seldom  . Drug use: No  . Sexual activity: Not Asked   Other Topics Concern  . None   Social History Narrative  . None    Current Facility-Administered Medications on File Prior to Encounter  Medication Dose Route Frequency Provider Last Rate Last Dose  . 0.9 %  sodium chloride infusion  500 mL Intravenous Continuous Ladene Artist, MD       Current Outpatient Prescriptions on File Prior to Encounter  Medication Sig Dispense Refill  . atorvastatin (LIPITOR) 40 MG tablet Take 1 tablet by mouth  daily (Patient taking differently: TAKE 1 TABLET (40 MG) BY MOUTH THREE TIMES WEEK AT BEDTIME -  TUESDAY, THURSDAY, SATURDAY) 90 tablet 1  . bisoprolol-hydrochlorothiazide (ZIAC) 5-6.25 MG tablet TAKE 1 TABLET EVERY MORNING FOR BP (Patient taking differently: TAKE 1/2 TABLET BY MOUTH EVERY MORNING FOR BP) 90 tablet 1  . buPROPion (WELLBUTRIN XL) 150 MG 24 hr tablet TAKE 1 TABLET BY MOUTH  DAILY (Patient taking differently: TAKE 1 TABLET BY MOUTH  DAILY AFTER LUNCH) 90 tablet 1  . Cholecalciferol (VITAMIN D-3) 1000 UNITS CAPS Take 2,000 Units by mouth daily.    . ranitidine (ZANTAC) 150 MG tablet TAKE 1 TABLET BY MOUTH TWO  TIMES DAILY 180 tablet 1  . VESICARE 10 MG tablet TAKE 1 TABLET (10 MG TOTAL) BY MOUTH DAILY. (Patient taking differently: Take 5 mg by mouth daily. ) 90 tablet 1  . ciprofloxacin (CIPRO) 500 MG tablet Take 1 tablet (500 mg total) by mouth 2 (two) times daily. (Patient taking differently: Take 500 mg by mouth 2 (two) times daily. 7 day course prescribed  11/24/16) 14 tablet 0  . metroNIDAZOLE (FLAGYL) 500 MG tablet Take 1 tablet (500 mg total) by mouth 3 (three) times daily. (Patient taking differently: Take 500 mg by mouth 3 (three) times daily. 7 day course prescribed 11/24/16) 21 tablet 0    Review of Systems: a complete, 10pt review of systems was completed with pertinent positives and negatives as documented in the HPI.   Physical Exam: Vitals:   11/24/16 1645 11/24/16 1700  BP: 133/65 126/75  Pulse: 78 81  Resp:    Temp:     Gen: A&Ox3, no distress  Head: normocephalic, atraumatic, EOMI, anicteric.  Neck: supple without mass or thyromegaly Chest: unlabored respirations symmetrical air entry  Cardiovascular: RRR with palpable distal pulses, minimal nonpitting bilateral lower extremity edema Abdomen: soft, obese, nondistended. Mildly tender in RLQ without guarding. Negative rovsing negative psoas Extremities: warm, without edema, no deformities  Neuro: grossly intact Psych: appropriate mood and affect, normal insight Skin: no lesions or rashes on limited skin exam  CBC Latest Ref Rng & Units 11/24/2016 11/24/2016 08/11/2016  WBC 4.0 - 10.5 K/uL 6.5 5.9 6.5  Hemoglobin 13.0 - 17.0 g/dL 13.0 13.2 14.0  Hematocrit 39.0 - 52.0 % 38.9(L) 39.0 42.0  Platelets 150 - 400 K/uL 238 256 282    CMP Latest Ref Rng & Units 11/24/2016 08/11/2016 02/05/2016  Glucose 65 - 99 mg/dL 84 109(H) 112(H)  BUN 7 - 25 mg/dL 18 18 18   Creatinine 0.70 - 1.25 mg/dL 1.64(H) 1.45(H) 1.44(H)  Sodium 135 - 146 mmol/L 138 140 139  Potassium 3.5 - 5.3 mmol/L 5.2 4.5 4.7  Chloride 98 - 110 mmol/L 101 104 104  CO2 20 - 31 mmol/L 27 29 28   Calcium 8.6 - 10.3 mg/dL 9.5 9.8 9.4  Total Protein 6.1 - 8.1 g/dL 6.9 6.8 6.8  Total Bilirubin 0.2 - 1.2 mg/dL 1.0 0.5 0.5  Alkaline Phos 40 - 115 U/L 80 85 83  AST 10 - 35 U/L 24 27 24   ALT 9 - 46 U/L 23 29 25     No results found for: INR, PROTIME  Imaging: CT ABDOMEN AND PELVIS WITH CONTRAST  TECHNIQUE: Multidetector CT  imaging of the abdomen and pelvis was performed using the standard protocol following bolus administration of intravenous contrast.  Creatinine was obtained on site at [facility] at [address]  Results: Creatinine 1.3 mg/dL.  CONTRAST:  124mL ISOVUE-300 IOPAMIDOL (ISOVUE-300) INJECTION 61%  COMPARISON:  11/19/2005 CT abdomen/ pelvis.  FINDINGS: Lower chest: No significant pulmonary nodules or  acute consolidative airspace disease.  Hepatobiliary: Normal liver with no liver mass. Normal gallbladder with no radiopaque cholelithiasis. No biliary ductal dilatation.  Pancreas: Heterogeneous fatty infiltration of the pancreas. No pancreatic mass or duct dilation.  Spleen: Normal size. No mass.  Adrenals/Urinary Tract: No discrete adrenal nodules. No hydronephrosis. Subcentimeter hypodense renal cortical lesions in the upper right and lower left kidneys, too small to characterize, for which no further follow-up is required. Normal bladder.  Stomach/Bowel: Grossly normal stomach. Normal caliber small bowel with no definite small bowel wall thickening. Appendix measures 9 mm diameter, minimally increased from 7 mm on 11/19/2005. There is borderline mild appendiceal wall thickening and hyperenhancement in the proximal appendix without significant periappendiceal fat stranding. No large bowel wall thickening, significant diverticulosis or significant pericolonic fat stranding.  Vascular/Lymphatic: Atherosclerotic nonaneurysmal abdominal aorta. Patent portal, splenic, hepatic and renal veins. No pathologically enlarged lymph nodes in the abdomen or pelvis.  Reproductive: Top-normal size prostate.  Other: No pneumoperitoneum, ascites or focal fluid collection. Small fat containing umbilical hernia is increased in size. Symmetric mild gynecomastia.  Musculoskeletal: No aggressive appearing focal osseous lesions. Mild thoracolumbar  spondylosis.  IMPRESSION: 1. CT findings are equivocal for early acute appendicitis. Mildly dilated appendix, slightly increased in caliber compared to a 2007 CT study. Borderline mild appendiceal wall thickening and hyperenhancement. Surgical consultation advised. 2. No evidence of bowel obstruction, perforation or abscess. 3. Small fat containing umbilical hernia. 4. Aortic atherosclerosis. These results will be called to the ordering clinician or representative by the Radiologist Assistant, and communication documented in the PACS or zVision Dashboard.   Electronically Signed   By: Ilona Sorrel M.D.   On: 11/24/2016 14:08  A/P: 66yo gentleman with CT findings concerning for early appendicitis, although the duration and quality of symptoms are somewhat unusual for this in the setting of a normal WBC. I suspect he has had a gastroenteritis which may have caused some lymphadenopathy or swelling and secondary appendiceal dilation or inflammation, perhaps spurring very early appendicitis as seen on CT. I discussed laparoscopic appendectomy with him including risks of bleeding, pain, scarring, injury to intraabdominal structures, staple line leak or abscess, conversion to open surgery, blood clot, pneumonia, stroke, heart attack or death. He expressed understanding and his questions were answered to his satisfaction. Will proceed with laparoscopic appendectomy this evening.    Romana Juniper, MD [facility] Surgery, [place] Pager [phone]

## 2016-11-24 NOTE — Progress Notes (Signed)
D; Pt came out c/o ext ream ABD Pain, tight. Notified MD, VS pt to evaluated, it might be the air, call if any changes. Marland Kitchen

## 2016-11-25 ENCOUNTER — Encounter (HOSPITAL_COMMUNITY): Payer: Self-pay | Admitting: Surgery

## 2016-11-25 LAB — CBC
HCT: 36.1 % — ABNORMAL LOW (ref 39.0–52.0)
HEMOGLOBIN: 12.1 g/dL — AB (ref 13.0–17.0)
MCH: 30.6 pg (ref 26.0–34.0)
MCHC: 33.5 g/dL (ref 30.0–36.0)
MCV: 91.2 fL (ref 78.0–100.0)
PLATELETS: 233 10*3/uL (ref 150–400)
RBC: 3.96 MIL/uL — ABNORMAL LOW (ref 4.22–5.81)
RDW: 13.3 % (ref 11.5–15.5)
WBC: 9 10*3/uL (ref 4.0–10.5)

## 2016-11-25 LAB — LACTIC ACID, PLASMA: LACTIC ACID: 15 mg/dL (ref 4–16)

## 2016-11-25 LAB — BASIC METABOLIC PANEL
ANION GAP: 9 (ref 5–15)
BUN: 14 mg/dL (ref 6–20)
CALCIUM: 8.5 mg/dL — AB (ref 8.9–10.3)
CO2: 23 mmol/L (ref 22–32)
CREATININE: 1.4 mg/dL — AB (ref 0.61–1.24)
Chloride: 100 mmol/L — ABNORMAL LOW (ref 101–111)
GFR calc Af Amer: 59 mL/min — ABNORMAL LOW (ref >60)
GFR, EST NON AFRICAN AMERICAN: 50 mL/min — AB (ref >60)
GLUCOSE: 150 mg/dL — AB (ref 65–99)
Potassium: 4.4 mmol/L (ref 3.5–5.1)
Sodium: 132 mmol/L — ABNORMAL LOW (ref 135–145)

## 2016-11-25 LAB — TYPE AND SCREEN
ABO/RH(D): B POS
Antibody Screen: NEGATIVE

## 2016-11-25 MED ORDER — OXYCODONE HCL 5 MG PO TABS: 5.0000 mg | ORAL_TABLET | Freq: Four times a day (QID) | ORAL | 0 refills | Status: DC | PRN

## 2016-11-25 MED ORDER — ALUM & MAG HYDROXIDE-SIMETH 200-200-20 MG/5ML PO SUSP
30.0000 mL | ORAL | Status: DC | PRN
  Administered 2016-11-25: 30 mL via ORAL
  Filled 2016-11-25: qty 30

## 2016-11-25 NOTE — Plan of Care (Signed)
Problem: Safety: Goal: Ability to remain free from injury will improve Outcome: Progressing Patient has remained free of falls during this admission. Family at bedside. Patient alert and oriented. Call bell within reach. Bed in low and locked position. Clean and clear environment maintained. Patient verbalized understanding of safety instruction. 3/4 siderails in place.   Problem: Pain Management: Goal: General experience of comfort will improve Outcome: Progressing Pain being controlled with PRN PO pain medications

## 2016-11-25 NOTE — Care Management Obs Status (Signed)
[place] NOTIFICATION   Patient Details  Name: RANNIE CRANEY MRN: [id number] Date of Birth: 13-Jan-1951   Medicare Observation Status Notification Given:  Yes    Marilu Favre, RN 11/25/2016, 10:58 AM

## 2016-11-25 NOTE — Progress Notes (Signed)
Patient ID: [name] Park, male   DOB: 04-11-1951, 66 y.o.   MRN: [id number]  [facility] Surgery Progress Note  1 Day Post-Op  Subjective: CC- appendicitis Abdominal pain improving this morning. Tolerating liquids, has not yet had any solid food. Feels bloated and having indigestion; protonix helping. Denies n/v. Has not ambulated yet since surgery.  Objective: Vital signs in last 24 hours: Temp:  [97 F (36.1 C)-98.6 F (37 C)] 98.6 F (37 C) (06/05 0539) Pulse Rate:  [72-102] 102 (06/05 0539) Resp:  [13-20] 19 (06/05 0539) BP: (116-145)/(61-82) 145/75 (06/05 0539) SpO2:  [95 %-100 %] 98 % (06/05 0539) Weight:  [190 lb (86.2 kg)-190 lb 0.6 oz (86.2 kg)] 190 lb 0.6 oz (86.2 kg) (06/04 2106) Last BM Date:  (not since surgery)  Intake/Output from previous day: 06/04 0701 - 06/05 0700 In: 3164.6 [I.V.:2014.6; IV Piggyback:1150] Out: 1385 [Urine:1375; Blood:10] Intake/Output this shift: No intake/output data recorded.  PE: Gen:  Alert, NAD, pleasant Pulm:  CTAB, no W/R/R, effort normal Abd: Soft, distended, appropriately tender, +BS, no HSM, no hernia, incisions C/D/I Ext:  No erythema, edema, or tenderness BUE/BLE   Lab Results:   Recent Labs  11/24/16 1630 11/25/16 0431  WBC 6.5 9.0  HGB 13.0 12.1*  HCT 38.9* 36.1*  PLT 238 233   BMET  Recent Labs  11/24/16 1630 11/25/16 0431  NA 136 132*  K 3.9 4.4  CL 99* 100*  CO2 26 23  GLUCOSE 84 150*  BUN 16 14  CREATININE 1.54* 1.40*  CALCIUM 9.4 8.5*   PT/INR No results for input(s): LABPROT, INR in the last 72 hours. CMP     Component Value Date/Time   NA 132 (L) 11/25/2016 0431   K 4.4 11/25/2016 0431   CL 100 (L) 11/25/2016 0431   CO2 23 11/25/2016 0431   GLUCOSE 150 (H) 11/25/2016 0431   BUN 14 11/25/2016 0431   CREATININE 1.40 (H) 11/25/2016 0431   CREATININE 1.64 (H) 11/24/2016 1158   CALCIUM 8.5 (L) 11/25/2016 0431   PROT 7.1 11/24/2016 1630   ALBUMIN 4.2 11/24/2016 1630   AST 29  11/24/2016 1630   ALT 28 11/24/2016 1630   ALKPHOS 83 11/24/2016 1630   BILITOT 1.4 (H) 11/24/2016 1630   GFRNONAA 50 (L) 11/25/2016 0431   GFRNONAA 43 (L) 11/24/2016 1158   GFRAA 59 (L) 11/25/2016 0431   GFRAA 49 (L) 11/24/2016 1158   Lipase     Component Value Date/Time   LIPASE 38 11/24/2016 1630       Studies/Results: Ct Abdomen Pelvis W Contrast  Result Date: 11/24/2016 CLINICAL DATA:  Right lower quadrant abdominal pain and tenderness with repair and tenderness for 2 days. Fever and chills. EXAM: CT ABDOMEN AND PELVIS WITH CONTRAST TECHNIQUE: Multidetector CT imaging of the abdomen and pelvis was performed using the standard protocol following bolus administration of intravenous contrast. Creatinine was obtained on site at [facility] at [address] Results: Creatinine 1.3 mg/dL. CONTRAST:  17mL ISOVUE-300 IOPAMIDOL (ISOVUE-300) INJECTION 61% COMPARISON:  11/19/2005 CT abdomen/ pelvis. FINDINGS: Lower chest: No significant pulmonary nodules or acute consolidative airspace disease. Hepatobiliary: Normal liver with no liver mass. Normal gallbladder with no radiopaque cholelithiasis. No biliary ductal dilatation. Pancreas: Heterogeneous fatty infiltration of the pancreas. No pancreatic mass or duct dilation. Spleen: Normal size. No mass. Adrenals/Urinary Tract: No discrete adrenal nodules. No hydronephrosis. Subcentimeter hypodense renal cortical lesions in the upper right and lower left kidneys, too small to characterize, for which  no further follow-up is required. Normal bladder. Stomach/Bowel: Grossly normal stomach. Normal caliber small bowel with no definite small bowel wall thickening. Appendix measures 9 mm diameter, minimally increased from 7 mm on 11/19/2005. There is borderline mild appendiceal wall thickening and hyperenhancement in the proximal appendix without significant periappendiceal fat stranding. No large bowel wall thickening, significant diverticulosis or  significant pericolonic fat stranding. Vascular/Lymphatic: Atherosclerotic nonaneurysmal abdominal aorta. Patent portal, splenic, hepatic and renal veins. No pathologically enlarged lymph nodes in the abdomen or pelvis. Reproductive: Top-normal size prostate. Other: No pneumoperitoneum, ascites or focal fluid collection. Small fat containing umbilical hernia is increased in size. Symmetric mild gynecomastia. Musculoskeletal: No aggressive appearing focal osseous lesions. Mild thoracolumbar spondylosis. IMPRESSION: 1. CT findings are equivocal for early acute appendicitis. Mildly dilated appendix, slightly increased in caliber compared to a 2007 CT study. Borderline mild appendiceal wall thickening and hyperenhancement. Surgical consultation advised. 2. No evidence of bowel obstruction, perforation or abscess. 3. Small fat containing umbilical hernia. 4. Aortic atherosclerosis. These results will be called to the ordering clinician or representative by the Radiologist Assistant, and communication documented in the PACS or zVision Dashboard. Electronically Signed   By: Ilona Sorrel M.D.   On: 11/24/2016 14:08    Anti-infectives: Anti-infectives    Start     Dose/Rate Route Frequency Ordered Stop   11/25/16 1600  cefTRIAXone (ROCEPHIN) 2 g in dextrose 5 % 50 mL IVPB     2 g 100 mL/hr over 30 Minutes Intravenous Every 24 hours 11/24/16 1741     11/25/16 0100  metroNIDAZOLE (FLAGYL) IVPB 500 mg     500 mg 100 mL/hr over 60 Minutes Intravenous Every 8 hours 11/24/16 1741     11/24/16 1630  cefTRIAXone (ROCEPHIN) 2 g in dextrose 5 % 50 mL IVPB     2 g 100 mL/hr over 30 Minutes Intravenous  Once 11/24/16 1617 11/24/16 1707   11/24/16 1630  metroNIDAZOLE (FLAGYL) IVPB 500 mg     500 mg 100 mL/hr over 60 Minutes Intravenous  Once 11/24/16 1617 11/24/16 1808       Assessment/Plan HTN HLD GERD COPD  Acute appendicitis S/p Laparoscopic Appendectomy 6/4 Dr. Kae Heller - POD 1  ID - rocephin/flagyl  6/4>>6/5 FEN - full liquids advance as tolerated VTE - SCDs, heparin  Plan - Continue IVF this morning for AKI. Advance diet as tolerated. Please ambulate with patient today. Will recheck later today for possible discharge. He does not need abx at discharge, and will follow-up in clinic in 2-3 weeks.   LOS: 0 days    Jerrye Beavers , [facility] Surgery 11/25/2016, 8:39 AM Pager: [phone] Consults: [phone] Mon-Fri 7:00 am-4:30 pm Sat-Sun 7:00 am-11:30 am

## 2016-11-25 NOTE — Discharge Instructions (Signed)

## 2016-11-25 NOTE — Discharge Summary (Signed)
[place] Discharge Summary   Patient ID: [name] Park MRN: [id number] DOB/AGE: 1950/12/01 66 y.o.  Admit date: 11/24/2016 Discharge date: 11/25/2016  Admitting Diagnosis: Acute appendicitis  Discharge Diagnosis Patient Active Problem List   Diagnosis Date Noted  . Acute appendicitis 11/24/2016  . Thoracic aorta atherosclerosis ([place]) 08/11/2016  . Encounter for general adult medical examination with abnormal findings 02/05/2016  . COPD (chronic obstructive pulmonary disease) with chronic bronchitis ([place]) 09/07/2014  . Generalized anxiety disorder 09/07/2014  . Hyperlipidemia 06/08/2014  . GERD  03/08/2014  . OAB (overactive bladder) 03/08/2014  . Diverticulitis of colon without hemorrhage 03/08/2014  . Essential hypertension 12/05/2013  . Prediabetes 12/05/2013  . Medication management 12/05/2013  . Vitamin D deficiency 06/21/2013    Consultants None  Imaging: Ct Abdomen Pelvis W Contrast  Result Date: 11/24/2016 CLINICAL DATA:  Right lower quadrant abdominal pain and tenderness with repair and tenderness for 2 days. Fever and chills. EXAM: CT ABDOMEN AND PELVIS WITH CONTRAST TECHNIQUE: Multidetector CT imaging of the abdomen and pelvis was performed using the standard protocol following bolus administration of intravenous contrast. Creatinine was obtained on site at [place] at [address] Results: Creatinine 1.3 mg/dL. CONTRAST:  14mL ISOVUE-300 IOPAMIDOL (ISOVUE-300) INJECTION 61% COMPARISON:  11/19/2005 CT abdomen/ pelvis. FINDINGS: Lower chest: No significant pulmonary nodules or acute consolidative airspace disease. Hepatobiliary: Normal liver with no liver mass. Normal gallbladder with no radiopaque cholelithiasis. No biliary ductal dilatation. Pancreas: Heterogeneous fatty infiltration of the pancreas. No pancreatic mass or duct dilation. Spleen: Normal size. No mass. Adrenals/Urinary Tract: No discrete adrenal nodules. No  hydronephrosis. Subcentimeter hypodense renal cortical lesions in the upper right and lower left kidneys, too small to characterize, for which no further follow-up is required. Normal bladder. Stomach/Bowel: Grossly normal stomach. Normal caliber small bowel with no definite small bowel wall thickening. Appendix measures 9 mm diameter, minimally increased from 7 mm on 11/19/2005. There is borderline mild appendiceal wall thickening and hyperenhancement in the proximal appendix without significant periappendiceal fat stranding. No large bowel wall thickening, significant diverticulosis or significant pericolonic fat stranding. Vascular/Lymphatic: Atherosclerotic nonaneurysmal abdominal aorta. Patent portal, splenic, hepatic and renal veins. No pathologically enlarged lymph nodes in the abdomen or pelvis. Reproductive: Top-normal size prostate. Other: No pneumoperitoneum, ascites or focal fluid collection. Small fat containing umbilical hernia is increased in size. Symmetric mild gynecomastia. Musculoskeletal: No aggressive appearing focal osseous lesions. Mild thoracolumbar spondylosis. IMPRESSION: 1. CT findings are equivocal for early acute appendicitis. Mildly dilated appendix, slightly increased in caliber compared to a 2007 CT study. Borderline mild appendiceal wall thickening and hyperenhancement. Surgical consultation advised. 2. No evidence of bowel obstruction, perforation or abscess. 3. Small fat containing umbilical hernia. 4. Aortic atherosclerosis. These results will be called to the ordering clinician or representative by the Radiologist Assistant, and communication documented in the PACS or zVision Dashboard. Electronically Signed   By: Ilona Sorrel M.D.   On: 11/24/2016 14:08    Procedures Dr. Kae Heller (11/24/16) - Laparoscopic Appendectomy  Hospital Course:  [name] Park is a 66yo male who presented to [facility] 11/24/16 with 2 days of general lower abdominal pain associated with diarrhea, nausea and  chills. Patient went to see his PCP who ordered a CT scan; scan showed early appendicitis and he was recommended to go to the ED. Patient was admitted and underwent procedure listed above.  Tolerated procedure well and was transferred to the floor.  Diet was advanced as tolerated.  On POD1 the patient  was voiding well, tolerating diet, ambulating well, pain well controlled, vital signs stable, incisions c/d/i and felt stable for discharge home.  Patient will follow up in our office in 2 weeks and knows to call with questions or concerns.    I have personally reviewed the patients medication history on the [place] controlled substance database.    Physical Exam: Gen:  Alert, NAD, pleasant Pulm:  CTAB, no W/R/R, effort normal Abd: Soft, distended, appropriately tender, +BS, no HSM, no hernia, incisions C/D/I Ext:  No erythema, edema, or tenderness BUE/BLE   Allergies as of 11/25/2016   No Known Allergies     Medication List    STOP taking these medications   ciprofloxacin 500 MG tablet Commonly known as:  CIPRO   metroNIDAZOLE 500 MG tablet Commonly known as:  FLAGYL     TAKE these medications   aspirin EC 81 MG tablet Take 81 mg by mouth daily. Take 1 tablet (81 mg) by mouth 3 times week - Sunday, Wednesday and Friday - with lunch   atorvastatin 40 MG tablet Commonly known as:  LIPITOR Take 1 tablet by mouth  daily What changed:  See the new instructions.   bisoprolol-hydrochlorothiazide 5-6.25 MG tablet Commonly known as:  ZIAC TAKE 1 TABLET EVERY MORNING FOR BP What changed:  See the new instructions.   buPROPion 150 MG 24 hr tablet Commonly known as:  WELLBUTRIN XL TAKE 1 TABLET BY MOUTH  DAILY What changed:  See the new instructions.   hydrocortisone 2.5 % lotion Apply 1 application topically daily as needed (scalp itching).   oxyCODONE 5 MG immediate release tablet Commonly known as:  Oxy IR/ROXICODONE Take 1-2 tablets (5-10 mg total) by mouth every 6 (six) hours as  needed for moderate pain.   ranitidine 150 MG tablet Commonly known as:  ZANTAC TAKE 1 TABLET BY MOUTH TWO  TIMES DAILY   VESICARE 10 MG tablet Generic drug:  solifenacin TAKE 1 TABLET (10 MG TOTAL) BY MOUTH DAILY. What changed:  how much to take   Vitamin D-3 1000 units Caps Take 2,000 Units by mouth daily.        Follow-up [place] [facility], [place]. Call in 3 week(s).   Specialty:  General Surgery Why:  We are working on your appointment, please call to confirm Contact information: [address] [place] [phone]          Signed: Jerrye Beavers, [facility] Surgery 11/25/2016, 2:04 PM Pager: [phone] Consults: [phone] Mon-Fri 7:00 am-4:30 pm Sat-Sun 7:00 am-11:30 am

## 2016-11-25 NOTE — Progress Notes (Signed)
Discharge paperwork given to patient. Prescription given. Patient is ready for discharge. 

## 2016-11-26 ENCOUNTER — Encounter: Payer: Self-pay | Admitting: Physician Assistant

## 2016-11-26 NOTE — Anesthesia Postprocedure Evaluation (Signed)
Anesthesia Post Note  Patient: [name] Park  Procedure(s) Performed: Procedure(s) (LRB): APPENDECTOMY LAPAROSCOPIC (N/A)     Patient location during evaluation: PACU Anesthesia Type: General Level of consciousness: awake and alert and patient cooperative Pain management: pain level controlled Vital Signs Assessment: post-procedure vital signs reviewed and stable Respiratory status: spontaneous breathing and respiratory function stable Cardiovascular status: stable Anesthetic complications: no    Last Vitals:  Vitals:   11/25/16 0539 11/25/16 1411  BP: (!) 145/75 124/62  Pulse: (!) 102 84  Resp: 19 18  Temp: 37 C 37 C    Last Pain:  Vitals:   11/25/16 1411  TempSrc: Oral  PainSc:                  [name]

## 2017-02-01 ENCOUNTER — Other Ambulatory Visit: Payer: Self-pay | Admitting: Internal Medicine

## 2017-02-02 ENCOUNTER — Other Ambulatory Visit: Payer: Self-pay

## 2017-02-02 DIAGNOSIS — N3281 Overactive bladder: Secondary | ICD-10-CM

## 2017-02-02 MED ORDER — SOLIFENACIN SUCCINATE 10 MG PO TABS: 10.0000 mg | ORAL_TABLET | Freq: Every day | ORAL | 1 refills | Status: DC

## 2017-03-10 ENCOUNTER — Ambulatory Visit (INDEPENDENT_AMBULATORY_CARE_PROVIDER_SITE_OTHER): Payer: Medicare Other | Admitting: Internal Medicine

## 2017-03-10 ENCOUNTER — Encounter: Payer: Self-pay | Admitting: Internal Medicine

## 2017-03-10 VITALS — BP 128/76 | HR 60 | Temp 97.6°F | Resp 16 | Ht 68.0 in | Wt 190.0 lb

## 2017-03-10 DIAGNOSIS — Z125 Encounter for screening for malignant neoplasm of prostate: Secondary | ICD-10-CM

## 2017-03-10 DIAGNOSIS — Z79899 Other long term (current) drug therapy: Secondary | ICD-10-CM | POA: Diagnosis not present

## 2017-03-10 DIAGNOSIS — E559 Vitamin D deficiency, unspecified: Secondary | ICD-10-CM

## 2017-03-10 DIAGNOSIS — I10 Essential (primary) hypertension: Secondary | ICD-10-CM | POA: Diagnosis not present

## 2017-03-10 DIAGNOSIS — Z0000 Encounter for general adult medical examination without abnormal findings: Secondary | ICD-10-CM

## 2017-03-10 DIAGNOSIS — E782 Mixed hyperlipidemia: Secondary | ICD-10-CM | POA: Diagnosis not present

## 2017-03-10 DIAGNOSIS — R7303 Prediabetes: Secondary | ICD-10-CM

## 2017-03-10 DIAGNOSIS — Z136 Encounter for screening for cardiovascular disorders: Secondary | ICD-10-CM

## 2017-03-10 DIAGNOSIS — Z0001 Encounter for general adult medical examination with abnormal findings: Secondary | ICD-10-CM

## 2017-03-10 DIAGNOSIS — Z1211 Encounter for screening for malignant neoplasm of colon: Secondary | ICD-10-CM

## 2017-03-10 NOTE — Progress Notes (Signed)
[facility]   Unk Pinto, M.D.     Uvaldo Bristle. Silverio Lay, P.A.-C Liane Comber, [place]                [address] [place], [place] [address] Telephone [phone] Telefax [phone] Annual  Screening/Preventative Visit  & Comprehensive Evaluation & Examination     This very nice 66 y.o. MWM presents for a Screening/Preventative Visit & comprehensive evaluation and management of multiple medical co-morbidities.  Patient has been followed for HTN, Prediabetes, Hyperlipidemia and Vitamin D Deficiency. Patient has GERD controlled w/prudent diet and his meds.      HTN predates circa 2012. Patient's BP has been controlled at home.  Today's BP is at goal -  128/76. Patient denies any cardiac symptoms as chest pain, palpitations, shortness of breath, dizziness or ankle swelling.     Patient's hyperlipidemia is controlled with diet and medications. Patient denies myalgias or other medication SE's. Last lipids were at goal: Lab Results  Component Value Date   CHOL 134 08/11/2016   HDL 57 08/11/2016   LDLCALC 53 08/11/2016   TRIG 118 08/11/2016   CHOLHDL 2.4 08/11/2016      Patient has prediabetes wit A1c 5.8% and 6.3% in 2011 and then down to 5.9% in 2016. aPatient denies reactive hypoglycemic symptoms, visual blurring, diabetic polys or paresthesias. Last A1c was at goal: Lab Results  Component Value Date   HGBA1C 5.4 08/11/2016       Finally, patient has history of Vitamin D Deficiency ("31" in 2013) and last vitamin D was at goal: Lab Results  Component Value Date   VD25OH 59 08/11/2016   Current Outpatient Prescriptions on File Prior to Visit  Medication Sig  . aspirin EC 81 MG tablet Take 81 mg by mouth daily. Take 1 tablet (81 mg) by mouth 3 times week - Sunday, Wednesday and Friday - with lunch  . atorvastatin (LIPITOR) 40 MG tablet TAKE 1 TABLET BY MOUTH  DAILY  .  bisoprolol-hydrochlorothiazide (ZIAC) 5-6.25 MG tablet TAKE 1 TABLET EVERY MORNING FOR BP (Patient taking differently: TAKE 1/2 TABLET BY MOUTH EVERY MORNING FOR BP)  . buPROPion (WELLBUTRIN XL) 150 MG 24 hr tablet TAKE 1 TABLET BY MOUTH  DAILY  . Cholecalciferol (VITAMIN D-3) 1000 UNITS CAPS Take 2,000 Units by mouth daily.  . hydrocortisone 2.5 % lotion Apply 1 application topically daily as needed (scalp itching).   . ranitidine (ZANTAC) 150 MG tablet TAKE 1 TABLET BY MOUTH TWO  TIMES DAILY  . solifenacin (VESICARE) 10 MG tablet Take 1 tablet (10 mg total) by mouth daily.   Current Facility-Administered Medications on File Prior to Visit  Medication  . 0.9 %  sodium chloride infusion   No Known Allergies Past Medical History:  Diagnosis Date  . COPD (chronic obstructive pulmonary disease) ([place])   . GERD (gastroesophageal reflux disease)   . Hematuria   . HLD (hyperlipidemia)   . Hypertension   . IBS (irritable bowel syndrome)   . Pre-diabetes   . Vitamin D deficiency    Health Maintenance  Topic Date Due  . INFLUENZA VACCINE  01/21/2017  . TETANUS/TDAP  04/28/2021  . COLONOSCOPY  02/27/2026  . Hepatitis C Screening  Completed  . PNA vac Low Risk Adult  Completed   Immunization History  Administered Date(s) Administered  . Influenza Whole 04/07/2013  .  Influenza, High Dose Seasonal PF 04/13/2014, 03/27/2015, 03/25/2016  . PPD Test 12/05/2013  . Pneumococcal Conjugate-13 06/08/2014  . Pneumococcal Polysaccharide-23 09/07/2014  . Tdap 04/29/2011  . Zoster 06/12/2014   Past Surgical History:  Procedure Laterality Date  . CARPAL TUNNEL RELEASE Right   . CERVICAL FUSION    . HAND SURGERY Left   . LAPAROSCOPIC APPENDECTOMY N/A 11/24/2016   Procedure: APPENDECTOMY LAPAROSCOPIC;  Surgeon: Clovis Riley, MD;  Location: [facility] OR;  Service: General;  Laterality: N/A;  . TRIGGER FINGER RELEASE Right    thumb   Family History  Problem Relation Age of Onset  . Irritable bowel  syndrome Mother   . Diverticulosis Mother   . Lupus Mother   . COPD Mother   . Hypertension Mother   . Lung cancer Father   . Other Father        bladder polyps  . Stroke Father   . Kidney disease Father   . Cancer Father   . Colon cancer Neg Hx   . Esophageal cancer Neg Hx   . Rectal cancer Neg Hx   . Stomach cancer Neg Hx    Social History   Social History  . Marital status: Married    Spouse name: N/A  . Number of children: 1  . Years of education: N/A   Occupational History  . retired    Social History Main Topics  . Smoking status: Former Smoker    Packs/day: 1.50    Years: 35.00    Types: Cigarettes    Quit date: 02/05/2004  . Smokeless tobacco: Never Used  . Alcohol use Yes     Comment: seldom  . Drug use: No  . Sexual activity: Active    ROS Constitutional: Denies fever, chills, weight loss/gain, headaches, insomnia,  night sweats or change in appetite. Does c/o fatigue. Eyes: Denies redness, blurred vision, diplopia, discharge, itchy or watery eyes.  ENT: Denies discharge, congestion, post nasal drip, epistaxis, sore throat, earache, hearing loss, dental pain, Tinnitus, Vertigo, Sinus pain or snoring.  Cardio: Denies chest pain, palpitations, irregular heartbeat, syncope, dyspnea, diaphoresis, orthopnea, PND, claudication or edema Respiratory: denies cough, dyspnea, DOE, pleurisy, hoarseness, laryngitis or wheezing.  Gastrointestinal: Denies dysphagia, heartburn, reflux, water brash, pain, cramps, nausea, vomiting, bloating, diarrhea, constipation, hematemesis, melena, hematochezia, jaundice or hemorrhoids Genitourinary: Denies dysuria, frequency, urgency, nocturia, hesitancy, discharge, hematuria or flank pain Musculoskeletal: Denies arthralgia, myalgia, stiffness, Jt. Swelling, pain, limp or strain/sprain. Denies Falls. Skin: Denies puritis, rash, hives, warts, acne, eczema or change in skin lesion Neuro: No weakness, tremor, incoordination, spasms,  paresthesia or pain Psychiatric: Denies confusion, memory loss or sensory loss. Denies Depression. Endocrine: Denies change in weight, skin, hair change, nocturia, and paresthesia, diabetic polys, visual blurring or hyper / hypo glycemic episodes.  Heme/Lymph: No excessive bleeding, bruising or enlarged lymph nodes.  Physical Exam  BP 128/76   Pulse 60   Temp 97.6 F (36.4 C)   Resp 16   Ht 5\' 8"  (1.727 m)   Wt 190 lb (86.2 kg)   BMI 28.89 kg/m   General Appearance: Well nourished and well groomed and in no apparent distress.  Eyes: PERRLA, EOMs, conjunctiva no swelling or erythema, normal fundi and vessels. Sinuses: No frontal/maxillary tenderness ENT/Mouth: EACs patent / TMs  nl. Nares clear without erythema, swelling, mucoid exudates. Oral hygiene is good. No erythema, swelling, or exudate. Tongue normal, non-obstructing. Tonsils not swollen or erythematous. Hearing normal.  Neck: Supple, thyroid normal. No bruits, nodes or  JVD. Respiratory: Respiratory effort normal.  BS equal and clear bilateral without rales, rhonci, wheezing or stridor. Cardio: Heart sounds are normal with regular rate and rhythm and no murmurs, rubs or gallops. Peripheral pulses are normal and equal bilaterally without edema. No aortic or femoral bruits. Chest: symmetric with normal excursions and percussion.  Abdomen: Soft, with Nl bowel sounds. Nontender, no guarding, rebound, hernias, masses, or organomegaly.  Lymphatics: Non tender without lymphadenopathy.  Genitourinary: No hernias.Testes nl. DRE - prostate nl for age - smooth & firm w/o nodules. Musculoskeletal: Full ROM all peripheral extremities, joint stability, 5/5 strength, and normal gait. Skin: Warm and dry without rashes, lesions, cyanosis, clubbing or  ecchymosis.  Neuro: Cranial nerves intact, reflexes equal bilaterally. Normal muscle tone, no cerebellar symptoms. Sensation intact.  Pysch: Alert and oriented X 3 with normal affect, insight and  judgment appropriate.   Assessment and Plan  1. Annual Preventative/Screening Exam   2. Essential hypertension  - EKG 12-Lead - [place], RETROPERITNL ABD,  LTD - Urinalysis, Routine w reflex microscopic - Microalbumin / creatinine urine ratio - CBC with Differential/Platelet - BASIC METABOLIC PANEL WITH GFR - Magnesium - TSH  3. Hyperlipidemia, mixed  - EKG 12-Lead - [place], RETROPERITNL ABD,  LTD - Hepatic function panel - Lipid panel  4. Prediabetes  - EKG 12-Lead - Hemoglobin A1c - Insulin, fasting  5. Vitamin D deficiency  - VITAMIN D 25 Hydroxy  6. Colon cancer screening  - POC Hemoccult Bld/Stl  7. Prostate cancer screening  - PSA  8. Screening for ischemic heart disease  - EKG 12-Lead  9. Screening for AAA (aortic abdominal aneurysm)  - [place], RETROPERITNL ABD,  LTD  10. Medication management  - Urinalysis, Routine w reflex microscopic - Microalbumin / creatinine urine ratio - CBC with Differential/Platelet - BASIC METABOLIC PANEL WITH GFR - Hepatic function panel - Magnesium - Lipid panel - TSH - Hemoglobin A1c - Insulin, fasting - VITAMIN D 25 Hydroxy        Patient was counseled in prudent diet, weight control to achieve/maintain BMI less than 25, BP monitoring, regular exercise and medications as discussed.  Discussed med effects and SE's. Routine screening labs and tests as requested with regular follow-up as recommended. Over 40 minutes of exam, counseling, chart review and high complex critical decision making was performed

## 2017-03-10 NOTE — Patient Instructions (Signed)

## 2017-03-11 LAB — HEPATIC FUNCTION PANEL
AG RATIO: 1.8 (calc) (ref 1.0–2.5)
ALBUMIN MSPROF: 4.4 g/dL (ref 3.6–5.1)
ALT: 24 U/L (ref 9–46)
AST: 22 U/L (ref 10–35)
Alkaline phosphatase (APISO): 94 U/L (ref 40–115)
Bilirubin, Direct: 0.1 mg/dL (ref 0.0–0.2)
GLOBULIN: 2.5 g/dL (ref 1.9–3.7)
Indirect Bilirubin: 0.3 mg/dL (calc) (ref 0.2–1.2)
Total Bilirubin: 0.4 mg/dL (ref 0.2–1.2)
Total Protein: 6.9 g/dL (ref 6.1–8.1)

## 2017-03-11 LAB — URINALYSIS, ROUTINE W REFLEX MICROSCOPIC
BILIRUBIN URINE: NEGATIVE
Bacteria, UA: NONE SEEN /HPF
GLUCOSE, UA: NEGATIVE
HGB URINE DIPSTICK: NEGATIVE
Hyaline Cast: NONE SEEN /LPF
KETONES UR: NEGATIVE
NITRITE: NEGATIVE
PROTEIN: NEGATIVE
RBC / HPF: NONE SEEN /HPF (ref 0–2)
SQUAMOUS EPITHELIAL / LPF: NONE SEEN /HPF (ref <=5)
Specific Gravity, Urine: 1.011 (ref 1.001–1.03)
WBC, UA: NONE SEEN /HPF (ref 0–5)
pH: 6 (ref 5.0–8.0)

## 2017-03-11 LAB — CBC WITH DIFFERENTIAL/PLATELET
BASOS ABS: 38 {cells}/uL (ref 0–200)
Basophils Relative: 0.6 %
EOS PCT: 2.1 %
Eosinophils Absolute: 132 cells/uL (ref 15–500)
HCT: 40 % (ref 38.5–50.0)
HEMOGLOBIN: 13.6 g/dL (ref 13.2–17.1)
Lymphs Abs: 1544 cells/uL (ref 850–3900)
MCH: 30.3 pg (ref 27.0–33.0)
MCHC: 34 g/dL (ref 32.0–36.0)
MCV: 89.1 fL (ref 80.0–100.0)
MPV: 11.1 fL (ref 7.5–12.5)
Monocytes Relative: 11.2 %
NEUTROS ABS: 3881 {cells}/uL (ref 1500–7800)
NEUTROS PCT: 61.6 %
Platelets: 275 10*3/uL (ref 140–400)
RBC: 4.49 10*6/uL (ref 4.20–5.80)
RDW: 12.3 % (ref 11.0–15.0)
Total Lymphocyte: 24.5 %
WBC mixed population: 706 cells/uL (ref 200–950)
WBC: 6.3 10*3/uL (ref 3.8–10.8)

## 2017-03-11 LAB — VITAMIN D 25 HYDROXY (VIT D DEFICIENCY, FRACTURES): VIT D 25 HYDROXY: 59 ng/mL (ref 30–100)

## 2017-03-11 LAB — BASIC METABOLIC PANEL WITH GFR
BUN/Creatinine Ratio: 14 (calc) (ref 6–22)
BUN: 21 mg/dL (ref 7–25)
CHLORIDE: 101 mmol/L (ref 98–110)
CO2: 29 mmol/L (ref 20–32)
Calcium: 9.9 mg/dL (ref 8.6–10.3)
Creat: 1.5 mg/dL — ABNORMAL HIGH (ref 0.70–1.25)
GFR, Est African American: 55 mL/min/{1.73_m2} — ABNORMAL LOW (ref >=60)
GFR, Est Non African American: 47 mL/min/{1.73_m2} — ABNORMAL LOW (ref >=60)
GLUCOSE: 86 mg/dL (ref 65–99)
POTASSIUM: 4.2 mmol/L (ref 3.5–5.3)
SODIUM: 139 mmol/L (ref 135–146)

## 2017-03-11 LAB — HEMOGLOBIN A1C
HEMOGLOBIN A1C: 5.5 %{Hb} (ref <5.7)
Mean Plasma Glucose: 111 (calc)
eAG (mmol/L): 6.2 (calc)

## 2017-03-11 LAB — LIPID PANEL
Cholesterol: 155 mg/dL (ref <200)
HDL: 56 mg/dL (ref >40)
LDL Cholesterol (Calc): 77 mg/dL (calc)
Non-HDL Cholesterol (Calc): 99 mg/dL (calc) (ref <130)
TRIGLYCERIDES: 141 mg/dL (ref <150)
Total CHOL/HDL Ratio: 2.8 (calc) (ref <5.0)

## 2017-03-11 LAB — PSA: PSA: 0.8 ng/mL (ref <=4.0)

## 2017-03-11 LAB — MAGNESIUM: Magnesium: 2 mg/dL (ref 1.5–2.5)

## 2017-03-11 LAB — INSULIN, FASTING: INSULIN: 5.8 u[IU]/mL (ref 2.0–19.6)

## 2017-03-11 LAB — MICROALBUMIN / CREATININE URINE RATIO
CREATININE, URINE: 86 mg/dL (ref 20–320)
Microalb Creat Ratio: 6 mcg/mg creat (ref <30)
Microalb, Ur: 0.5 mg/dL

## 2017-03-11 LAB — TSH: TSH: 1.2 m[IU]/L (ref 0.40–4.50)

## 2017-03-31 ENCOUNTER — Ambulatory Visit: Payer: Medicare Other | Admitting: *Deleted

## 2017-04-08 ENCOUNTER — Ambulatory Visit (INDEPENDENT_AMBULATORY_CARE_PROVIDER_SITE_OTHER): Payer: Medicare Other | Admitting: Podiatry

## 2017-04-08 ENCOUNTER — Encounter: Payer: Self-pay | Admitting: Podiatry

## 2017-04-08 VITALS — Resp 16 | Ht 69.0 in | Wt 190.0 lb

## 2017-04-08 DIAGNOSIS — L853 Xerosis cutis: Secondary | ICD-10-CM | POA: Diagnosis not present

## 2017-04-08 DIAGNOSIS — S90222A Contusion of left lesser toe(s) with damage to nail, initial encounter: Secondary | ICD-10-CM | POA: Diagnosis not present

## 2017-04-08 MED ORDER — CEPHALEXIN 500 MG PO CAPS: 500.0000 mg | ORAL_CAPSULE | Freq: Three times a day (TID) | ORAL | 2 refills | Status: DC

## 2017-04-08 NOTE — Progress Notes (Signed)
Subjective:    Patient ID: [name] Park, male    DOB: 1951/01/27, 66 y.o.   MRN: [id number]  HPI 66 year old male presents the office today for concerns of soreness to his left big toe. He states that he was walking down a hill and certain pair of shoes and he stated after [place]ina subluxated toenail. Had some pain with the shoe in the software the shoe the pain went away although he started where the shoe again causes him symptoms. He just her that she is away. He states that there is no blood underneath the nail plate has started also get somewhat red around the toenail. Denies a drainage or pus any red streaks. He has has dry skin to his heels he's been seen a dermatologist for this. Denies any open sores or skin fissures. No other concerns today.   Review of Systems  All other systems reviewed and are negative.  Past Medical History:  Diagnosis Date  . COPD (chronic obstructive pulmonary disease) ([place])   . GERD (gastroesophageal reflux disease)   . Hematuria   . HLD (hyperlipidemia)   . Hypertension   . IBS (irritable bowel syndrome)   . Pre-diabetes   . Vitamin D deficiency     Past Surgical History:  Procedure Laterality Date  . CARPAL TUNNEL RELEASE Right   . CERVICAL FUSION    . HAND SURGERY Left   . LAPAROSCOPIC APPENDECTOMY N/A 11/24/2016   Procedure: APPENDECTOMY LAPAROSCOPIC;  Surgeon: Clovis Riley, MD;  Location: [facility] OR;  Service: General;  Laterality: N/A;  . TRIGGER FINGER RELEASE Right    thumb     Current Outpatient Prescriptions:  .  aspirin EC 81 MG tablet, Take 81 mg by mouth daily. Take 1 tablet (81 mg) by mouth 3 times week - Sunday, Wednesday and Friday - with lunch, Disp: , Rfl:  .  atorvastatin (LIPITOR) 40 MG tablet, TAKE 1 TABLET BY MOUTH  DAILY, Disp: 90 tablet, Rfl: 1 .  bisoprolol-hydrochlorothiazide (ZIAC) 5-6.25 MG tablet, TAKE 1 TABLET EVERY MORNING FOR BP (Patient taking differently: TAKE 1/2 TABLET BY MOUTH EVERY MORNING FOR BP), Disp: 90  tablet, Rfl: 1 .  buPROPion (WELLBUTRIN XL) 150 MG 24 hr tablet, TAKE 1 TABLET BY MOUTH  DAILY, Disp: 90 tablet, Rfl: 1 .  Cholecalciferol (VITAMIN D-3) 1000 UNITS CAPS, Take 2,000 Units by mouth daily., Disp: , Rfl:  .  hydrocortisone 2.5 % lotion, Apply 1 application topically daily as needed (scalp itching). , Disp: , Rfl:  .  ranitidine (ZANTAC) 150 MG tablet, TAKE 1 TABLET BY MOUTH TWO  TIMES DAILY, Disp: 180 tablet, Rfl: 1 .  solifenacin (VESICARE) 10 MG tablet, Take 1 tablet (10 mg total) by mouth daily., Disp: 90 tablet, Rfl: 1 .  cephALEXin (KEFLEX) 500 MG capsule, Take 1 capsule (500 mg total) by mouth 3 (three) times daily., Disp: 30 capsule, Rfl: 2  No Known Allergies  Social History   Social History  . Marital status: Married    Spouse name: N/A  . Number of children: 1  . Years of education: N/A   Occupational History  . retired    Social History Main Topics  . Smoking status: Former Smoker    Packs/day: 1.50    Years: 35.00    Types: Cigarettes    Quit date: 02/05/2004  . Smokeless tobacco: Never Used  . Alcohol use Yes     Comment: seldom  . Drug use: No  . Sexual activity: Not  on file   Other Topics Concern  . Not on file   Social History Narrative  . No narrative on file          Objective:   Physical Exam  General: AAO x3, NAD  Dermatological: Left hallux toenail with evidence of subungual hematoma on the lateral aspect the nail started to lift the underlying nail bed. There is minimal erythema around the nail border but there is no ascending cellulitis. There is no drainage or pus expressed. There is no extension of the hyperpigmentation into the surrounding skin. There is no open lesions identified otherwise. Dry skin plantar heels bilaterally.  Vascular: Dorsalis Pedis artery and Posterior Tibial artery pedal pulses are 2/4 bilateral with immedate capillary fill time. Pedal hair growth present. There is no pain with calf compression, swelling,  warmth, erythema.   Neruologic: Grossly intact via light touch bilateral.  Protective threshold with Semmes Wienstein monofilament intact to all pedal sites bilateral.   Musculoskeletal: Tenderness left hallux toenail. No other areas of tenderness. Muscular strength 5/5 in all groups tested bilateral.  Gait: Unassisted, Nonantalgic.      Assessment & Plan:  66 year old male left hallux subungual hematoma with localized erythema, dry skin on the heels -Treatment options discussed including all alternatives, risks, and complications -Etiology of symptoms were discussed -At this point I recommended nail avulsion however he wishes to hold off on this. I'll able to sharply debrided toenail without any complications or bleeding. Recommended Epsom salts soaks daily as well as interbody ointment and a bandage. Prescribed Keflex. Symptoms not improve next week or so we'll do the nail avulsion. -Recommended moisturizer to the heels daily. Discussed Moisturizer.  Celesta Gentile, DPM

## 2017-04-08 NOTE — Patient Instructions (Signed)

## 2017-04-20 ENCOUNTER — Encounter: Payer: Self-pay | Admitting: Podiatry

## 2017-04-20 ENCOUNTER — Ambulatory Visit (INDEPENDENT_AMBULATORY_CARE_PROVIDER_SITE_OTHER): Payer: Medicare Other | Admitting: Podiatry

## 2017-04-20 DIAGNOSIS — S90222D Contusion of left lesser toe(s) with damage to nail, subsequent encounter: Secondary | ICD-10-CM

## 2017-04-23 DIAGNOSIS — S90222A Contusion of left lesser toe(s) with damage to nail, initial encounter: Secondary | ICD-10-CM | POA: Insufficient documentation

## 2017-04-23 NOTE — Progress Notes (Signed)
Subjective: [name] Park presents the office status today for follow-up evaluation of left hallux cephalohematoma localized erythema. He finished his course of antibiotics he states he is having no pain of the nail and the redness has resolved and he is not is any drainage or pus. He has no recent injury or trauma he has no other concerns today. Denies any systemic complaints such as fevers, chills, nausea, vomiting. No acute changes since last appointment, and no other complaints at this time.   Objective: AAO x3, NAD DP/PT pulses palpable bilaterally, CRT less than 3 seconds Left hallux toenail appears to be firmly adhered to the underlying nail bed however is evidence of simple hematoma. There is no surrounding erythema, ascending cellulitis. No drainage or pus expressed today. There is no pain with nail. No clinical signs of infection No open lesions or pre-ulcerative lesions.  No pain with calf compression, swelling, warmth, erythema  Assessment: Subungual hematoma left hallux without signs of infection  Plan: -All treatment options discussed with the patient including all alternatives, risks, complications.  -At this point is no redness in the nails firmly adhered. Is also no pain. Wishes to hold off on taking the toenail off at this point. Discussed this any loosening of the nail or any other signs or symptoms of recurrent infection to call the office and he'll likely need the nail removed decreased splint. -Patient encouraged to call the office with any questions, concerns, change in symptoms.   Celesta Gentile, DPM

## 2017-05-11 ENCOUNTER — Other Ambulatory Visit: Payer: Self-pay | Admitting: Physician Assistant

## 2017-06-10 ENCOUNTER — Encounter: Payer: Self-pay | Admitting: Adult Health

## 2017-06-10 DIAGNOSIS — E663 Overweight: Secondary | ICD-10-CM | POA: Insufficient documentation

## 2017-06-10 DIAGNOSIS — Z87891 Personal history of nicotine dependence: Secondary | ICD-10-CM | POA: Insufficient documentation

## 2017-06-10 NOTE — Progress Notes (Signed)
FOLLOW UP  Assessment and Plan:   Diagnoses and all orders for this visit:  Thoracic aorta atherosclerosis ([place]) Control blood pressure, cholesterol, glucose, increase exercise.   COPD (chronic obstructive pulmonary disease) with chronic bronchitis (HCC) 53 pack year hx; quit smoking; continue to encourage avoidance of triggers; asymptomatic; monitoring; encourage regular exercise   Former smoker (54 pack year) Continue annual low dose screening CT per recommendations  Hypertension Well controlled with current medications  Monitor blood pressure at home; patient to call if consistently greater than 130/80 Continue DASH diet.   Reminder to go to the ER if any CP, SOB, nausea, dizziness, severe HA, changes vision/speech, left arm numbness and tingling and jaw pain.  Cholesterol Continue medication  Continue low cholesterol diet and exercise.  Check lipid panel.   Other abnormal glucose A1C well controlled on last several checks; defer today  Continue diet and exercise.  Perform daily foot/skin check, notify office of any concerning changes.  BMP  Overweight Long discussion about weight loss, diet, and exercise Recommended diet heavy in fruits and veggies and low in animal meats, cheeses, and dairy products, appropriate calorie intake Discussed appropriate weight for height Follow up in 3 months  Vitamin D Def/ osteoporosis prevention Continue supplementation Check Vit D level  CKD stage 3 Increase fluids, avoid NSAIDS, monitor sugars, will monitor  Continue diet and meds as discussed. Further disposition pending results of labs. Discussed med's effects and SE's.   Over 30 minutes of exam, counseling, chart review, and critical decision making was performed.   Future Appointments  Date Time Provider [place]  09/16/2017 10:30 AM Unk Pinto, MD [facility] None  04/01/2018  9:00 AM Unk Pinto, MD [facility] None     ----------------------------------------------------------------------------------------------------------------------  HPI 66 y.o. male  presents for 3 month follow up on hypertension, cholesterol, history of abnormal glucose, COPD, weight and vitamin D deficiency. He is a former smoking with a 53 pack year smoking history.  BMI is Body mass index is 29.04 kg/m., he has not been working on diet and exercise. Wt Readings from Last 3 Encounters:  06/11/17 191 lb (86.6 kg)  04/08/17 190 lb (86.2 kg)  03/10/17 190 lb (86.2 kg)   His blood pressure has been controlled at home, today their BP is BP: 110/68  He does not workout - plans to restart.  He denies chest pain, shortness of breath, dizziness.   He is on cholesterol medication and denies myalgias. His cholesterol is at goal. The cholesterol last visit was:   Lab Results  Component Value Date   CHOL 155 03/10/2017   HDL 56 03/10/2017   LDLCALC 53 08/11/2016   TRIG 141 03/10/2017   CHOLHDL 2.8 03/10/2017    He has not been working on diet and exercise for history of abnormal glucose, and denies increased appetite, nausea, paresthesia of the feet, polydipsia, polyuria, visual disturbances and vomiting. Last A1C in the office was:  Lab Results  Component Value Date   HGBA1C 5.5 03/10/2017   Patient is on Vitamin D supplement though remains below goal:   Lab Results  Component Value Date   VD25OH 59 03/10/2017        Current Medications:  Current Outpatient Medications on File Prior to Visit  Medication Sig  . aspirin EC 81 MG tablet Take 81 mg by mouth daily. Take 1 tablet (81 mg) by mouth 3 times week - Sunday, Wednesday and Friday - with lunch  . atorvastatin (LIPITOR) 40 MG tablet TAKE 1  TABLET BY MOUTH  DAILY  . bisoprolol-hydrochlorothiazide (ZIAC) 5-6.25 MG tablet TAKE 1 TABLET EVERY MORNING FOR BP (Patient taking differently: TAKE 1/2 TABLET BY MOUTH EVERY MORNING FOR BP)  . buPROPion (WELLBUTRIN XL) 150 MG 24 hr  tablet TAKE 1 TABLET BY MOUTH  DAILY  . cephALEXin (KEFLEX) 500 MG capsule Take 1 capsule (500 mg total) by mouth 3 (three) times daily.  . Cholecalciferol (VITAMIN D-3) 1000 UNITS CAPS Take 2,000 Units by mouth daily.  . hydrocortisone 2.5 % lotion Apply 1 application topically daily as needed (scalp itching).   . ranitidine (ZANTAC) 150 MG tablet TAKE 1 TABLET BY MOUTH TWO  TIMES DAILY  . solifenacin (VESICARE) 10 MG tablet Take 1 tablet (10 mg total) by mouth daily.   No current facility-administered medications on file prior to visit.      Allergies: No Known Allergies   Medical History:  Past Medical History:  Diagnosis Date  . Acute appendicitis 11/24/2016  . COPD (chronic obstructive pulmonary disease) ([place])   . GERD (gastroesophageal reflux disease)   . Hematuria   . HLD (hyperlipidemia)   . Hypertension   . IBS (irritable bowel syndrome)   . Pre-diabetes   . Vitamin D deficiency    Family history- Reviewed and unchanged Social history- Reviewed and unchanged   Review of Systems:  Review of Systems  Constitutional: Negative for malaise/fatigue and weight loss.  HENT: Negative for hearing loss and tinnitus.   Eyes: Negative for blurred vision and double vision.  Respiratory: Positive for wheezing (He reports rare wheezing without dyspnea - not triggered by exercise). Negative for cough and shortness of breath.   Cardiovascular: Negative for chest pain, palpitations, orthopnea, claudication and leg swelling.  Gastrointestinal: Negative for abdominal pain, blood in stool, constipation, diarrhea, heartburn, melena, nausea and vomiting.  Genitourinary: Negative.   Musculoskeletal: Negative for joint pain and myalgias.  Skin: Negative for rash.  Neurological: Negative for dizziness, tingling, sensory change, weakness and headaches.  Endo/Heme/Allergies: Negative for polydipsia.  Psychiatric/Behavioral: Negative.   All other systems reviewed and are negative.   Physical  Exam: BP 110/68   Pulse (!) 56   Temp (!) 97.5 F (36.4 C)   Ht 5\' 8"  (1.727 m)   Wt 191 lb (86.6 kg)   SpO2 97%   BMI 29.04 kg/m  Wt Readings from Last 3 Encounters:  06/11/17 191 lb (86.6 kg)  04/08/17 190 lb (86.2 kg)  03/10/17 190 lb (86.2 kg)   General Appearance: Well nourished, in no apparent distress. Eyes: PERRLA, EOMs, conjunctiva no swelling or erythema Sinuses: No Frontal/maxillary tenderness ENT/Mouth: Ext aud canals clear, TMs without erythema, bulging. No erythema, swelling, or exudate on post pharynx.  Tonsils not swollen or erythematous. Hearing normal.  Neck: Supple, thyroid normal.  Respiratory: Respiratory effort normal, BS equal bilaterally without rales, rhonchi, wheezing or stridor.  Cardio: RRR with no MRGs. Brisk peripheral pulses without edema.  Abdomen: Soft, + BS.  Non tender, no guarding, rebound, hernias, masses. Lymphatics: Non tender without lymphadenopathy.  Musculoskeletal: Full ROM, 5/5 strength, Normal gait Skin: Warm, dry without rashes, lesions, ecchymosis.  Neuro: Cranial nerves intact. No cerebellar symptoms.  Psych: Awake and oriented X 3, normal affect, Insight and Judgment appropriate.    Izora Ribas, NP 11:23 AM [facility]

## 2017-06-11 ENCOUNTER — Encounter: Payer: Self-pay | Admitting: Adult Health

## 2017-06-11 ENCOUNTER — Ambulatory Visit (INDEPENDENT_AMBULATORY_CARE_PROVIDER_SITE_OTHER): Payer: Medicare Other | Admitting: Adult Health

## 2017-06-11 VITALS — BP 110/68 | HR 56 | Temp 97.5°F | Ht 68.0 in | Wt 191.0 lb

## 2017-06-11 DIAGNOSIS — I700 Atherosclerosis of aorta: Secondary | ICD-10-CM | POA: Diagnosis not present

## 2017-06-11 DIAGNOSIS — E559 Vitamin D deficiency, unspecified: Secondary | ICD-10-CM

## 2017-06-11 DIAGNOSIS — J449 Chronic obstructive pulmonary disease, unspecified: Secondary | ICD-10-CM

## 2017-06-11 DIAGNOSIS — Z87891 Personal history of nicotine dependence: Secondary | ICD-10-CM

## 2017-06-11 DIAGNOSIS — Z79899 Other long term (current) drug therapy: Secondary | ICD-10-CM

## 2017-06-11 DIAGNOSIS — N183 Chronic kidney disease, stage 3 unspecified: Secondary | ICD-10-CM | POA: Insufficient documentation

## 2017-06-11 DIAGNOSIS — I10 Essential (primary) hypertension: Secondary | ICD-10-CM | POA: Diagnosis not present

## 2017-06-11 DIAGNOSIS — R7309 Other abnormal glucose: Secondary | ICD-10-CM

## 2017-06-11 DIAGNOSIS — E663 Overweight: Secondary | ICD-10-CM

## 2017-06-11 DIAGNOSIS — E782 Mixed hyperlipidemia: Secondary | ICD-10-CM | POA: Diagnosis not present

## 2017-06-11 MED ORDER — ALBUTEROL SULFATE HFA 108 (90 BASE) MCG/ACT IN AERS
2.0000 | INHALATION_SPRAY | RESPIRATORY_TRACT | 0 refills | Status: DC | PRN
Start: 2017-06-11 — End: 2019-10-15

## 2017-06-11 NOTE — Patient Instructions (Signed)
80-100 ounces of water or unsweet tea daily for kidney health  Start walking every day - start with 15 min and work up to 30 min stretches for lung and heart health   Here is some information to help you keep your heart healthy: Move it! - Aim for 30 mins of activity every day. Take it slowly at first. Talk to [place] before starting any new exercise program.   Lose it.  -Body Mass Index (BMI) can indicate if you need to lose weight. A healthy range is 18.5-24.9. For a BMI calculator, go to [employer]  Waist Management -Excess abdominal fat is a risk factor for heart disease, diabetes, asthma, stroke and more. Ideal waist circumference is less than 35" for women and less than 40" for men.   Eat Right -focus on fruits, vegetables, whole grains, and meals you make yourself. Avoid foods with trans fat and high sugar/sodium content.   Snooze or Snore? - Loud snoring can be a sign of sleep apnea, a significant risk factor for high blood pressure, heart attach, stroke, and heart arrhythmias.  Kick the habit -Quit Smoking! Avoid second hand smoke. A single cigarette raises your blood pressure for 20 mins and increases the risk of heart attack and stroke for the next 24 hours.   Are Aspirin and Supplements right for you? -Add ENTERIC COATED low dose 81 mg Aspirin daily OR can do every other day if you have easy bruising to protect your heart and head. As well as to reduce risk of Colon Cancer by 20 %, Skin Cancer by 26 % , Melanoma by 46% and Pancreatic cancer by 60%  Say "No to Stress -There may be little you can do about problems that cause stress. However, techniques such as long walks, meditation, and exercise can help you manage it.   Start Now! - Make changes one at a time and set reasonable goals to increase your likelihood of success.

## 2017-06-12 LAB — HEPATIC FUNCTION PANEL
AG RATIO: 1.6 (calc) (ref 1.0–2.5)
ALKALINE PHOSPHATASE (APISO): 89 U/L (ref 40–115)
ALT: 26 U/L (ref 9–46)
AST: 27 U/L (ref 10–35)
Albumin: 4.2 g/dL (ref 3.6–5.1)
BILIRUBIN DIRECT: 0.1 mg/dL (ref 0.0–0.2)
BILIRUBIN INDIRECT: 0.5 mg/dL (ref 0.2–1.2)
Globulin: 2.6 g/dL (calc) (ref 1.9–3.7)
TOTAL PROTEIN: 6.8 g/dL (ref 6.1–8.1)
Total Bilirubin: 0.6 mg/dL (ref 0.2–1.2)

## 2017-06-12 LAB — LIPID PANEL
CHOLESTEROL: 145 mg/dL (ref <200)
HDL: 53 mg/dL (ref >40)
LDL Cholesterol (Calc): 73 mg/dL (calc)
NON-HDL CHOLESTEROL (CALC): 92 mg/dL (ref <130)
TRIGLYCERIDES: 102 mg/dL (ref <150)
Total CHOL/HDL Ratio: 2.7 (calc) (ref <5.0)

## 2017-06-12 LAB — BASIC METABOLIC PANEL WITH GFR
BUN / CREAT RATIO: 13 (calc) (ref 6–22)
BUN: 19 mg/dL (ref 7–25)
CO2: 29 mmol/L (ref 20–32)
CREATININE: 1.49 mg/dL — AB (ref 0.70–1.25)
Calcium: 10.4 mg/dL — ABNORMAL HIGH (ref 8.6–10.3)
Chloride: 103 mmol/L (ref 98–110)
GFR, EST AFRICAN AMERICAN: 55 mL/min/{1.73_m2} — AB (ref >=60)
GFR, Est Non African American: 48 mL/min/{1.73_m2} — ABNORMAL LOW (ref >=60)
GLUCOSE: 87 mg/dL (ref 65–99)
Potassium: 5.1 mmol/L (ref 3.5–5.3)
SODIUM: 142 mmol/L (ref 135–146)

## 2017-06-12 LAB — CBC WITH DIFFERENTIAL/PLATELET
BASOS ABS: 49 {cells}/uL (ref 0–200)
Basophils Relative: 0.8 %
EOS ABS: 238 {cells}/uL (ref 15–500)
Eosinophils Relative: 3.9 %
HCT: 38.1 % — ABNORMAL LOW (ref 38.5–50.0)
Hemoglobin: 13.2 g/dL (ref 13.2–17.1)
Lymphs Abs: 1360 cells/uL (ref 850–3900)
MCH: 30.6 pg (ref 27.0–33.0)
MCHC: 34.6 g/dL (ref 32.0–36.0)
MCV: 88.4 fL (ref 80.0–100.0)
MONOS PCT: 13.6 %
MPV: 10.7 fL (ref 7.5–12.5)
Neutro Abs: 3623 cells/uL (ref 1500–7800)
Neutrophils Relative %: 59.4 %
PLATELETS: 283 10*3/uL (ref 140–400)
RBC: 4.31 10*6/uL (ref 4.20–5.80)
RDW: 12.3 % (ref 11.0–15.0)
TOTAL LYMPHOCYTE: 22.3 %
WBC mixed population: 830 cells/uL (ref 200–950)
WBC: 6.1 10*3/uL (ref 3.8–10.8)

## 2017-06-12 LAB — VITAMIN D 25 HYDROXY (VIT D DEFICIENCY, FRACTURES): Vit D, 25-Hydroxy: 89 ng/mL (ref 30–100)

## 2017-06-12 LAB — TSH: TSH: 0.68 mIU/L (ref 0.40–4.50)

## 2017-07-27 DIAGNOSIS — H31002 Unspecified chorioretinal scars, left eye: Secondary | ICD-10-CM | POA: Diagnosis not present

## 2017-07-27 DIAGNOSIS — Z961 Presence of intraocular lens: Secondary | ICD-10-CM | POA: Diagnosis not present

## 2017-08-24 NOTE — Progress Notes (Signed)
Assessment and Plan:  [name] Park was seen today for uri.  Diagnoses and all orders for this visit:  Viral URI with cough Benign exam- Discussed the importance of avoiding unnecessary antibiotic therapy. Suggested symptomatic OTC remedies. Nasal saline spray for congestion, steam baths/humidifier Nasal steroids, allergy pill, oral steroids Follow up as needed. -     predniSONE (DELTASONE) 20 MG tablet; 2 tablets daily for 3 days, 1 tablet daily for 4 days. -     promethazine-dextromethorphan (PROMETHAZINE-DM) 6.25-15 MG/5ML syrup; Take 5 mLs by mouth 4 (four) times daily as needed for cough.  Other orders Only to be filled for progressive sx, fever Go to the ER if any chest pain, shortness of breath, nausea, dizziness, severe HA, changes vision/speech -     azithromycin (ZITHROMAX) 250 MG tablet; Take 2 tablets (500 mg) on  Day 1,  followed by 1 tablet (250 mg) once daily on Days 2 through 5.  Further disposition pending results of labs. Discussed med's effects and SE's.   Over 15 minutes of exam, counseling, chart review, and critical decision making was performed.   Future Appointments  Date Time Provider [place]  09/16/2017 10:30 AM Unk Pinto, MD [facility] None  04/01/2018  9:00 AM Unk Pinto, MD [facility] None    ------------------------------------------------------------------------------------------------------------------   HPI BP 130/70   Pulse 90   Temp 97.9 F (36.6 C)   Ht 5\' 8"  (1.727 m)   Wt 193 lb (87.5 kg)   SpO2 96%   BMI 29.35 kg/m    67 y.o.male , former smoker with 54 pack year hx quit in 01/2004, COPD on imaging without need for daily inhalers thus far presents for sore throat that began 3/1 - 5 days ago- began to worsen but then resolved, now has a non-productive cough and some tightness in his chest after coughing. No cough noted while in office today. Endorses mild wheezing well controlled by albuterol inhaler. He also endorses mild  dyspnea after coughing. Denies fever, headaches, palpitations, rashes, LE edema, positional dyspnea.    He has been prescribed albuterol in the past -   He has been taking mucinex without significant improvement  Past Medical History:  Diagnosis Date  . Acute appendicitis 11/24/2016  . COPD (chronic obstructive pulmonary disease) ([place])   . GERD (gastroesophageal reflux disease)   . Hematuria   . HLD (hyperlipidemia)   . Hypertension   . IBS (irritable bowel syndrome)   . Pre-diabetes   . Vitamin D deficiency      No Known Allergies  Current Outpatient Medications on File Prior to Visit  Medication Sig  . albuterol (VENTOLIN HFA) 108 (90 Base) MCG/ACT inhaler Inhale 2 puffs into the lungs every 4 (four) hours as needed for wheezing or shortness of breath.  Marland Kitchen aspirin EC 81 MG tablet Take 81 mg by mouth daily. Take 1 tablet (81 mg) by mouth 3 times week - Sunday, Wednesday and Friday - with lunch  . atorvastatin (LIPITOR) 40 MG tablet TAKE 1 TABLET BY MOUTH  DAILY  . bisoprolol-hydrochlorothiazide (ZIAC) 5-6.25 MG tablet TAKE 1 TABLET EVERY MORNING FOR BP (Patient taking differently: TAKE 1/2 TABLET BY MOUTH EVERY MORNING FOR BP)  . buPROPion (WELLBUTRIN XL) 150 MG 24 hr tablet TAKE 1 TABLET BY MOUTH  DAILY  . Cholecalciferol (VITAMIN D-3) 1000 UNITS CAPS Take 3,000 Units by mouth daily.   . hydrocortisone 2.5 % lotion Apply 1 application topically daily as needed (scalp itching).   . ranitidine (ZANTAC) 150 MG  tablet TAKE 1 TABLET BY MOUTH TWO  TIMES DAILY  . solifenacin (VESICARE) 10 MG tablet Take 1 tablet (10 mg total) by mouth daily. (Patient taking differently: Take 10 mg by mouth daily. 1/2 tablet daily)  . cephALEXin (KEFLEX) 500 MG capsule Take 1 capsule (500 mg total) by mouth 3 (three) times daily. (Patient not taking: Reported on 08/25/2017)   No current facility-administered medications on file prior to visit.     ROS: Review of Systems  Constitutional: Negative for  chills, diaphoresis, fever and malaise/fatigue.  HENT: Positive for sore throat. Negative for congestion, ear discharge, ear pain, hearing loss, sinus pain and tinnitus.   Eyes: Negative for blurred vision, pain, discharge and redness.  Respiratory: Positive for cough, shortness of breath and wheezing. Negative for hemoptysis, sputum production and stridor.   Cardiovascular: Negative for chest pain, palpitations and orthopnea.  Gastrointestinal: Negative for abdominal pain, diarrhea, nausea and vomiting.  Genitourinary: Negative.   Musculoskeletal: Negative for joint pain and myalgias.  Skin: Negative for rash.  Neurological: Negative for dizziness, sensory change, weakness and headaches.  Endo/Heme/Allergies: Negative for environmental allergies.  Psychiatric/Behavioral: Negative.   All other systems reviewed and are negative.   Physical Exam:  BP 130/70   Pulse 90   Temp 97.9 F (36.6 C)   Ht 5\' 8"  (1.727 m)   Wt 193 lb (87.5 kg)   SpO2 96%   BMI 29.35 kg/m   General Appearance: Well nourished, in no apparent distress. Eyes: PERRLA, EOMs, conjunctiva no swelling or erythema Sinuses: No Frontal/maxillary tenderness ENT/Mouth: Ext aud canals clear, TMs without erythema, bulging. No erythema, swelling, or exudate on post pharynx.  Tonsils not swollen or erythematous. Hearing normal.  Neck: Supple.  Respiratory: Respiratory effort normal, BS equal bilaterally without rales, rhonchi, wheezing or stridor.  Cardio: RRR with no MRGs. Brisk peripheral pulses without edema.  Abdomen: Soft, + BS.  Non tender, no guarding, rebound, hernias, masses. Lymphatics: Non tender without lymphadenopathy.  Musculoskeletal: symmetrical strength, normal gait.  Skin: Warm, dry without rashes, lesions, ecchymosis.  Neuro: Cranial nerves intact. Normal muscle tone, no cerebellar symptoms.   Psych: Awake and oriented X 3, normal affect, Insight and Judgment appropriate.     Izora Ribas, NP 2:40  PM [facility]

## 2017-08-25 ENCOUNTER — Ambulatory Visit (INDEPENDENT_AMBULATORY_CARE_PROVIDER_SITE_OTHER): Payer: Medicare Other | Admitting: Adult Health

## 2017-08-25 ENCOUNTER — Encounter: Payer: Self-pay | Admitting: Adult Health

## 2017-08-25 VITALS — BP 130/70 | HR 90 | Temp 97.9°F | Ht 68.0 in | Wt 193.0 lb

## 2017-08-25 DIAGNOSIS — J069 Acute upper respiratory infection, unspecified: Secondary | ICD-10-CM | POA: Diagnosis not present

## 2017-08-25 DIAGNOSIS — B9789 Other viral agents as the cause of diseases classified elsewhere: Secondary | ICD-10-CM | POA: Diagnosis not present

## 2017-08-25 MED ORDER — AZITHROMYCIN 250 MG PO TABS
ORAL_TABLET | ORAL | 1 refills | Status: AC
Start: 2017-08-25 — End: 2017-08-30

## 2017-08-25 MED ORDER — PREDNISONE 20 MG PO TABS: ORAL_TABLET | ORAL | 0 refills | Status: DC

## 2017-08-25 MED ORDER — PROMETHAZINE-DM 6.25-15 MG/5ML PO SYRP: 5.0000 mL | ORAL_SOLUTION | Freq: Four times a day (QID) | ORAL | 1 refills | Status: DC | PRN

## 2017-08-25 NOTE — Patient Instructions (Signed)
Common causes of cough OR hoarseness OR sore throat:   Allergies, Viral Infections, Acid Reflux and Bacterial Infections.   Allergies and viral infections cause a cough OR sore throat by post nasal drip and are often worse at night, can also have sneezing, lower grade fevers, clear/yellow mucus. This is best treated with allergy medications or nasal sprays.  Please get on allegra for 1-2 weeks The strongest is allegra or fexafinadine  Cheapest at [employer], [employer], [employer]   Bacterial infections are more severe than allergies or viral infections with fever, teeth pain, fatigue. This can be treated with prednisone and the same over the counter medication and after 7 days can be treated with an antibiotic.   Silent reflux/GERD can cause a cough OR sore throat OR hoarseness WITHOUT heart burn because the esophagus that goes to the stomach and trachea that goes to the lungs are very close and when you lay down the acid can irritate your throat and lungs. This can cause hoarseness, cough, and wheezing. Please stop any alcohol or anti-inflammatories like aleve/advil/ibuprofen and start an over the counter Prilosec or omeprazole 1-2 times daily 30mins before food for 2 weeks, then switch to over the counter zantac/ratinidine or pepcid/famotadine once at night for 2 weeks.    sometimes irritation causes more irritation. Try voice rest, use sugar free cough drops to prevent coughing, and try to stop clearing your throat.   If you ever have a cough that does not go away after trying these things please make a follow up visit for further evaluation or we can refer you to a specialist. Or if you ever have shortness of breath or chest pain go to the ER.    

## 2017-09-05 ENCOUNTER — Other Ambulatory Visit: Payer: Self-pay | Admitting: Physician Assistant

## 2017-09-05 ENCOUNTER — Other Ambulatory Visit: Payer: Self-pay | Admitting: Internal Medicine

## 2017-09-05 DIAGNOSIS — N3281 Overactive bladder: Secondary | ICD-10-CM

## 2017-09-16 ENCOUNTER — Ambulatory Visit: Payer: Self-pay | Admitting: Internal Medicine

## 2017-09-18 NOTE — Progress Notes (Signed)
Patient ID: [name] Park, male   DOB: July 25, 1950, 68 y.o.   MRN: [id number] Medicare wellness and 3 month follow up  Assessment and Plan:   Essential hypertension - continue medications, DASH diet, exercise and monitor at home. Call if greater than 130/80.  -     CBC with Differential/Platelet -     BASIC METABOLIC PANEL WITH GFR -     Hepatic function panel -     TSH  Thoracic aorta atherosclerosis (HCC) Control blood pressure, cholesterol, glucose, increase exercise.   COPD (chronic obstructive pulmonary disease) with chronic bronchitis (HCC) No triggers, well controlled symptoms, cont to monitor  Chronic kidney disease, stage 3 (HCC) Increase fluids, avoid NSAIDS, monitor sugars, will monitor -     BASIC METABOLIC PANEL WITH GFR  Medication management -     Magnesium  Mixed hyperlipidemia -continue medications, check lipids, decrease fatty foods, increase activity. -     Lipid panel  Other abnormal glucose Discussed disease progression and risks Discussed diet/exercise, weight management and risk modification  Vitamin D deficiency Continue supplement  Generalized anxiety disorder Continue wellbutrin  Overweight (BMI 25.0-29.9)  Overweight  - long discussion about weight loss, diet, and exercise -recommended diet heavy in fruits and veggies and low in animal meats, cheeses, and dairy products  Encounter for screening for malignant neoplasm of respiratory organs 54 pack, no symptoms, quit 14 years ago, will get one more low dose screening -lung cancer screening with low dose CT discussed as recommended by guidelines based on age, number of pack year history.  Discussed risks of screening including but not limited to false positives on xray, further testing or consultation with specialist, and possible false negative CT as well. Understanding expressed and wishes to proceed with CT testing. Order placed.  -     CT CHEST LUNG CA SCREEN LOW DOSE W/O CM; Future  Right  ear pain Right ear impacted cerumen - stop using Qtips, irrigation used in the office without complications, use OTC drops/oil at home to prevent reoccurence   Future Appointments  Date Time Provider [place]  04/01/2018  9:00 AM Unk Pinto, MD [facility] None    Continue diet and meds as discussed. Further disposition pending results of labs. During the course of the visit the patient was educated and counseled about appropriate screening and preventive services including:    Pneumococcal vaccine   Influenza vaccine  Td vaccine  Screening electrocardiogram  Screening mammography  Bone densitometry screening  Colorectal cancer screening  Diabetes screening  Glaucoma screening  Nutrition counseling   Advanced directives: given info/requested  HPI 67 y.o. male  presents for 3 month follow up with hypertension, hyperlipidemia, prediabetes and vitamin D and medicare wellness visit.   He notes for last several months he has been coughing more, worse in the morning, occ with mucus white. He has had some more wheezing, some SOB.  No night sweats.    His blood pressure has been controlled at home, he restarted 1/2 of the 5mg  and BP has been better, today their BP is BP: 120/64.    He does workout. He denies chest pain, shortness of breath, dizziness. He use to smoke, quit over 11 years ago, does have diagnosis of COPD via CXR in 2013 but denies any SOB and has not had any exacerbations.  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   Lab Results  Component Value Date   GFRNONAA 48 (L) 06/11/2017    He is on  cholesterol medication, he is on 1 lipitor 3 days a week and denies myalgias. His cholesterol is at goal. The cholesterol last visit was:   Lab Results  Component Value Date   CHOL 145 06/11/2017   HDL 53 06/11/2017   LDLCALC 73 06/11/2017   TRIG 102 06/11/2017   CHOLHDL 2.7 06/11/2017    He has been working on diet and exercise for prediabetes, and denies foot  ulcerations, hyperglycemia, hypoglycemia , increased appetite, nausea, paresthesia of the feet, polydipsia, polyuria, visual disturbances, vomiting and weight loss. Last A1C in the office was:  Lab Results  Component Value Date   HGBA1C 5.5 03/10/2017   Patient is on Vitamin D supplement.  Lab Results  Component Value Date   VD25OH 24 06/11/2017    He is on vesicare for OAB.   He is on wellbutrin for anxiety/depression which helps.  BMI is Body mass index is 29.35 kg/m., he is working on diet and exercise. Wt Readings from Last 3 Encounters:  09/22/17 193 lb (87.5 kg)  08/25/17 193 lb (87.5 kg)  06/11/17 191 lb (86.6 kg)    Current Medications:  Current Outpatient Medications on File Prior to Visit  Medication Sig Dispense Refill  . albuterol (VENTOLIN HFA) 108 (90 Base) MCG/ACT inhaler Inhale 2 puffs into the lungs every 4 (four) hours as needed for wheezing or shortness of breath. 1 Inhaler 0  . aspirin EC 81 MG tablet Take 81 mg by mouth daily. Take 1 tablet (81 mg) by mouth 3 times week - Sunday, Wednesday and Friday - with lunch    . atorvastatin (LIPITOR) 40 MG tablet TAKE 1 TABLET BY MOUTH  DAILY 90 tablet 1  . bisoprolol-hydrochlorothiazide (ZIAC) 5-6.25 MG tablet TAKE 1 TABLET EVERY MORNING FOR BP (Patient taking differently: TAKE 1/2 TABLET BY MOUTH EVERY MORNING FOR BP) 90 tablet 1  . buPROPion (WELLBUTRIN XL) 150 MG 24 hr tablet TAKE 1 TABLET BY MOUTH  DAILY 90 tablet 1  . Cholecalciferol (VITAMIN D-3) 1000 UNITS CAPS Take 3,000 Units by mouth daily.     . hydrocortisone 2.5 % lotion Apply 1 application topically daily as needed (scalp itching).     . ranitidine (ZANTAC) 150 MG tablet TAKE 1 TABLET BY MOUTH TWO  TIMES DAILY 180 tablet 1  . VESICARE 10 MG tablet TAKE 1 TABLET BY MOUTH  DAILY (Patient taking differently: TAKE 1/2 TABLET BY MOUTH  DAILY) 90 tablet 1   No current facility-administered medications on file prior to visit.     Medical History:  Past Medical  History:  Diagnosis Date  . Acute appendicitis 11/24/2016  . COPD (chronic obstructive pulmonary disease) ([place])   . GERD (gastroesophageal reflux disease)   . Hematuria   . HLD (hyperlipidemia)   . Hypertension   . IBS (irritable bowel syndrome)   . Pre-diabetes   . Vitamin D deficiency    Preventative care:  Immunization History  Administered Date(s) Administered  . Influenza Whole 04/07/2013  . Influenza, High Dose Seasonal PF 04/13/2014, 03/27/2015, 03/25/2016  . PPD Test 12/05/2013  . Pneumococcal Conjugate-13 06/08/2014  . Pneumococcal Polysaccharide-23 09/07/2014  . Tdap 04/29/2011  . Zoster 06/12/2014    Last colonoscopy: 2017 Dr. Fuller Plan.  EGD: 01/2013 DEXA: N/A CXR 09/02/2013 CT chest 01/2016 Ct AB 11/2016  Prior vaccinations: TD or Tdap: 2012         Influenza: 2017 ? Patient thinks got it here- check sept paper visit.  Pneumococcal: 2016 Prevnar13: 2015 Shingles/Zostavax: 2015  Names of Other Physician/Practitioners you currently use: 1. [facility]- here for primary care 2. Dr. Katy Apo, eye doctor, last visit 07/2016, yearly, s/p cataract surgery 3. Dr. Quillian Quince, dentist, last visit 6 months ago, has appt this month.  Patient Care Team: Unk Pinto, MD as PCP - General (Internal Medicine) Katy Apo, MD as Consulting Physician (Ophthalmology) Ladene Artist, MD as Consulting Physician (Gastroenterology) Eula Listen, DDS as Referring Physician (Dentistry) Druscilla Brownie, MD as Consulting Physician (Dermatology)  Allergies No Known Allergies  SURGICAL HISTORY He  has a past surgical history that includes Cervical fusion; Trigger finger release (Right); Carpal tunnel release (Right); Hand surgery (Left); and laparoscopic appendectomy (N/A, 11/24/2016). FAMILY HISTORY His family history includes COPD in his mother; Cancer in his father; Diverticulosis in his mother; Hypertension in his mother; Irritable  bowel syndrome in his mother; Kidney disease in his father; Lung cancer in his father; Lupus in his mother; Other in his father; Stroke in his father. SOCIAL HISTORY He  reports that he quit smoking about 13 years ago. His smoking use included cigarettes. He has a 52.50 pack-year smoking history. He has never used smokeless tobacco. He reports that he drinks alcohol. He reports that he does not use drugs.  MEDICARE WELLNESS OBJECTIVES: Physical activity: Current Exercise Habits: Home exercise routine, Type of exercise: walking, Time (Minutes): 30, Frequency (Times/Week): 2, Weekly Exercise (Minutes/Week): 60 Cardiac risk factors: Cardiac Risk Factors include: advanced age (>8men, >65 women);dyslipidemia;hypertension;obesity (BMI >30kg/m2);male gender;sedentary lifestyle Depression/mood screen:   Depression screen [facility] 2/9 09/22/2017  Decreased Interest 0  Down, Depressed, Hopeless 0  PHQ - 2 Score 0    ADLs:  In your present state of health, do you have any difficulty performing the following activities: 09/22/2017 03/10/2017  Hearing? N N  Vision? N N  Difficulty concentrating or making decisions? N N  Walking or climbing stairs? N N  Dressing or bathing? N N  Doing errands, shopping? N N  Some recent data might be hidden     Cognitive Testing  Alert? Yes  Normal Appearance?Yes  Oriented to person? Yes  Place? Yes   Time? Yes  Recall of three objects?  Yes  Can perform simple calculations? Yes  Displays appropriate judgment?Yes  Can read the correct time from a watch face?Yes  EOL planning: Does Patient Have a Medical Advance Directive?: Yes Does patient want to make changes to medical advance directive?: No - Patient declined    Review of Systems:  Review of Systems  Constitutional: Negative for chills, fever and malaise/fatigue.  HENT: Negative for congestion, ear pain and sore throat.   Respiratory: Negative for cough, shortness of breath and wheezing.   Cardiovascular:  Negative for chest pain, palpitations and leg swelling.  Gastrointestinal: Negative for blood in stool, constipation, diarrhea, heartburn and melena.  Genitourinary: Negative.   Musculoskeletal: Negative for myalgias.  Skin: Negative.   Neurological: Negative for dizziness, sensory change, loss of consciousness and headaches.  Psychiatric/Behavioral: Negative for depression. The patient is not nervous/anxious and does not have insomnia.     Physical Exam: BP 120/64   Pulse 69   Temp (!) 97.5 F (36.4 C)   Ht 5\' 8"  (1.727 m)   Wt 193 lb (87.5 kg)   SpO2 98%   BMI 29.35 kg/m  Wt Readings from Last 3 Encounters:  09/22/17 193 lb (87.5 kg)  08/25/17 193 lb (87.5 kg)  06/11/17 191 lb (86.6 kg)    General Appearance: Well  nourished well developed, in no apparent distress. Eyes: PERRLA, EOMs, conjunctiva no swelling or erythema ENT/Mouth: Ear canals normal without obstruction, swelling, erythma, discharge.  TMs normal bilaterally.  Oropharynx moist, clear, without exudate, or postoropharyngeal swelling. Neck: Supple, thyroid normal,no cervical adenopathy  Respiratory: Respiratory effort normal, Breath sounds clear A&P without rhonchi, wheeze, or rale.  No retractions, no accessory usage. Cardio: RRR with no MRGs. Brisk peripheral pulses without edema.  Abdomen: Soft, + BS,  Non tender, no guarding, rebound, hernias, masses. Musculoskeletal: Full ROM, 5/5 strength, Normal gait Skin: Warm, dry without rashes, lesions, ecchymosis.  Neuro: Awake and oriented X 3, Cranial nerves intact. Normal muscle tone, no cerebellar symptoms. Psych: Normal affect, Insight and Judgment appropriate.    Medicare Attestation I have personally reviewed: The patient's medical and social history Their use of alcohol, tobacco or illicit drugs Their current medications and supplements The patient's functional ability including ADLs,fall risks, home safety risks, cognitive, and hearing and visual  impairment Diet and physical activities Evidence for depression or mood disorders  The patient's weight, height, BMI, and visual acuity have been recorded in the chart.  I have made referrals, counseling, and provided education to the patient based on review of the above and I have provided the patient with a written personalized care plan for preventive services.   Vicie Mutters, PA-C 11:29 AM [facility]

## 2017-09-22 ENCOUNTER — Encounter: Payer: Self-pay | Admitting: Physician Assistant

## 2017-09-22 ENCOUNTER — Ambulatory Visit (INDEPENDENT_AMBULATORY_CARE_PROVIDER_SITE_OTHER): Payer: Medicare Other | Admitting: Physician Assistant

## 2017-09-22 VITALS — BP 120/64 | HR 69 | Temp 97.5°F | Ht 68.0 in | Wt 193.0 lb

## 2017-09-22 DIAGNOSIS — F411 Generalized anxiety disorder: Secondary | ICD-10-CM | POA: Diagnosis not present

## 2017-09-22 DIAGNOSIS — R6889 Other general symptoms and signs: Secondary | ICD-10-CM

## 2017-09-22 DIAGNOSIS — J449 Chronic obstructive pulmonary disease, unspecified: Secondary | ICD-10-CM

## 2017-09-22 DIAGNOSIS — H6121 Impacted cerumen, right ear: Secondary | ICD-10-CM

## 2017-09-22 DIAGNOSIS — Z0001 Encounter for general adult medical examination with abnormal findings: Secondary | ICD-10-CM | POA: Diagnosis not present

## 2017-09-22 DIAGNOSIS — Z0000 Encounter for general adult medical examination without abnormal findings: Secondary | ICD-10-CM

## 2017-09-22 DIAGNOSIS — J4489 Other specified chronic obstructive pulmonary disease: Secondary | ICD-10-CM

## 2017-09-22 DIAGNOSIS — N183 Chronic kidney disease, stage 3 unspecified: Secondary | ICD-10-CM

## 2017-09-22 DIAGNOSIS — H9201 Otalgia, right ear: Secondary | ICD-10-CM

## 2017-09-22 DIAGNOSIS — Z79899 Other long term (current) drug therapy: Secondary | ICD-10-CM | POA: Diagnosis not present

## 2017-09-22 DIAGNOSIS — E559 Vitamin D deficiency, unspecified: Secondary | ICD-10-CM | POA: Diagnosis not present

## 2017-09-22 DIAGNOSIS — E782 Mixed hyperlipidemia: Secondary | ICD-10-CM | POA: Diagnosis not present

## 2017-09-22 DIAGNOSIS — N3281 Overactive bladder: Secondary | ICD-10-CM | POA: Diagnosis not present

## 2017-09-22 DIAGNOSIS — Z87891 Personal history of nicotine dependence: Secondary | ICD-10-CM

## 2017-09-22 DIAGNOSIS — R7309 Other abnormal glucose: Secondary | ICD-10-CM | POA: Diagnosis not present

## 2017-09-22 DIAGNOSIS — K5732 Diverticulitis of large intestine without perforation or abscess without bleeding: Secondary | ICD-10-CM

## 2017-09-22 DIAGNOSIS — E663 Overweight: Secondary | ICD-10-CM

## 2017-09-22 DIAGNOSIS — Z122 Encounter for screening for malignant neoplasm of respiratory organs: Secondary | ICD-10-CM

## 2017-09-22 DIAGNOSIS — K219 Gastro-esophageal reflux disease without esophagitis: Secondary | ICD-10-CM

## 2017-09-22 DIAGNOSIS — I10 Essential (primary) hypertension: Secondary | ICD-10-CM

## 2017-09-22 DIAGNOSIS — I700 Atherosclerosis of aorta: Secondary | ICD-10-CM

## 2017-09-22 LAB — HEPATIC FUNCTION PANEL
AG Ratio: 1.5 (calc) (ref 1.0–2.5)
ALBUMIN MSPROF: 4.2 g/dL (ref 3.6–5.1)
ALT: 24 U/L (ref 9–46)
AST: 23 U/L (ref 10–35)
Alkaline phosphatase (APISO): 91 U/L (ref 40–115)
BILIRUBIN TOTAL: 0.5 mg/dL (ref 0.2–1.2)
Bilirubin, Direct: 0.1 mg/dL (ref 0.0–0.2)
GLOBULIN: 2.8 g/dL (ref 1.9–3.7)
Indirect Bilirubin: 0.4 mg/dL (calc) (ref 0.2–1.2)
Total Protein: 7 g/dL (ref 6.1–8.1)

## 2017-09-22 LAB — CBC WITH DIFFERENTIAL/PLATELET
BASOS ABS: 40 {cells}/uL (ref 0–200)
BASOS PCT: 0.6 %
EOS ABS: 261 {cells}/uL (ref 15–500)
EOS PCT: 3.9 %
HEMATOCRIT: 39.7 % (ref 38.5–50.0)
HEMOGLOBIN: 13.7 g/dL (ref 13.2–17.1)
LYMPHS ABS: 1394 {cells}/uL (ref 850–3900)
MCH: 30.4 pg (ref 27.0–33.0)
MCHC: 34.5 g/dL (ref 32.0–36.0)
MCV: 88 fL (ref 80.0–100.0)
MONOS PCT: 13.8 %
MPV: 10.9 fL (ref 7.5–12.5)
NEUTROS ABS: 4080 {cells}/uL (ref 1500–7800)
Neutrophils Relative %: 60.9 %
Platelets: 307 10*3/uL (ref 140–400)
RBC: 4.51 10*6/uL (ref 4.20–5.80)
RDW: 12.5 % (ref 11.0–15.0)
Total Lymphocyte: 20.8 %
WBC mixed population: 925 cells/uL (ref 200–950)
WBC: 6.7 10*3/uL (ref 3.8–10.8)

## 2017-09-22 LAB — LIPID PANEL
Cholesterol: 165 mg/dL (ref <200)
HDL: 56 mg/dL (ref >40)
LDL CHOLESTEROL (CALC): 85 mg/dL
NON-HDL CHOLESTEROL (CALC): 109 mg/dL (ref <130)
TRIGLYCERIDES: 143 mg/dL (ref <150)
Total CHOL/HDL Ratio: 2.9 (calc) (ref <5.0)

## 2017-09-22 LAB — TSH: TSH: 0.74 mIU/L (ref 0.40–4.50)

## 2017-09-22 LAB — BASIC METABOLIC PANEL WITH GFR
BUN/Creatinine Ratio: 12 (calc) (ref 6–22)
BUN: 20 mg/dL (ref 7–25)
CO2: 31 mmol/L (ref 20–32)
CREATININE: 1.61 mg/dL — AB (ref 0.70–1.25)
Calcium: 9.9 mg/dL (ref 8.6–10.3)
Chloride: 103 mmol/L (ref 98–110)
GFR, EST NON AFRICAN AMERICAN: 43 mL/min/{1.73_m2} — AB (ref >=60)
GFR, Est African American: 50 mL/min/{1.73_m2} — ABNORMAL LOW (ref >=60)
GLUCOSE: 106 mg/dL — AB (ref 65–99)
Potassium: 5.2 mmol/L (ref 3.5–5.3)
Sodium: 141 mmol/L (ref 135–146)

## 2017-09-22 LAB — MAGNESIUM: Magnesium: 2.1 mg/dL (ref 1.5–2.5)

## 2017-09-22 NOTE — Patient Instructions (Addendum)
Please pick one of the over the counter allergy medications below and take it once daily for allergies.  Claritin or loratadine cheapest but likely the weakest  Zyrtec or certizine at night because it can make you sleepy The strongest is allegra or fexafinadine  Cheapest at [employer], [employer], [employer]  Use a dropper or use a cap to put peroxide, olive oil,mineral oil or canola oil in the effected ear- 2-3 times a week. Let it soak for 20-30 min then you can take a shower or use a baby bulb with warm water to wash out the ear wax.  Do not use Qtips    Chronic Obstructive Pulmonary Disease Chronic obstructive pulmonary disease (COPD) is a long-term (chronic) condition that affects the lungs. COPD is a general term that can be used to describe many different lung problems that cause lung swelling (inflammation) and limit airflow, including chronic bronchitis and emphysema. If you have COPD, your lung function will probably never return to normal. In most cases, it gets worse over time. However, there are steps you can take to slow the progression of the disease and improve your quality of life. What are the causes? This condition may be caused by:  Smoking. This is the most common cause.  Certain genes passed down through families.  What increases the risk? The following factors may make you more likely to develop this condition:  Secondhand smoke from cigarettes, pipes, or cigars.  Exposure to chemicals and other irritants such as fumes and dust in the work environment.  Chronic lung conditions or infections.  What are the signs or symptoms? Symptoms of this condition include:  Shortness of breath, especially during physical activity.  Chronic cough with a large amount of thick mucus. Sometimes the cough may not have any mucus (dry cough).  Wheezing.  Rapid breaths.  Gray or bluish discoloration (cyanosis) of the skin, especially in your fingers, toes, or lips.  Feeling tired  (fatigue).  Weight loss.  Chest tightness.  Frequent infections.  Episodes when breathing symptoms become much worse (exacerbations).  Swelling in the ankles, feet, or legs. This may occur in later stages of the disease.  How is this diagnosed? This condition is diagnosed based on:  Your medical history.  A physical exam.  You may also have tests, including:  Lung (pulmonary) function tests. This may include a spirometry test, which measures your ability to exhale properly.  Chest X-ray.  CT scan.  Blood tests.  How is this treated? This condition may be treated with:  Medicines. These may include inhaled rescue medicines to treat acute exacerbations as well as long-term, or maintenance, medicines to prevent flare-ups of COPD. ? Bronchodilators help treat COPD by dilating the airways to allow increased airflow and make your breathing more comfortable. ? Steroids can reduce airway inflammation and help prevent exacerbations.  Smoking cessation. If you smoke, your health care provider may ask you to quit, and may also recommend therapy or replacement products to help you quit.  Pulmonary rehabilitation. This may involve working with a team of health care providers and specialists, such as respiratory, occupational, and physical therapists.  Exercise and physical activity. These are beneficial for nearly all people with COPD.  Nutrition therapy to gain weight, if you are underweight.  Oxygen. Supplemental oxygen therapy is only helpful if you have a low oxygen level in your blood (hypoxemia).  Lung surgery or transplant.  Palliative care. This is to help people with COPD feel comfortable when treatment is  no longer working.  Follow these instructions at home: Medicines  Take over-the-counter and prescription medicines (inhaled or pills) only as told by your health care provider.  Talk to your health care provider before taking any cough or allergy medicines. You  may need to avoid certain medicines that dry out your airways. Lifestyle  If you are a smoker, the most important thing that you can do is to stop smoking. Do not use any products that contain nicotine or tobacco, such as cigarettes and e-cigarettes. If you need help quitting, ask your health care provider. Continuing to smoke will cause the disease to progress faster.  Avoid exposure to things that irritate your lungs, such as smoke, chemicals, and fumes.  Stay active, but balance activity with periods of rest. Exercise and physical activity will help you maintain your ability to do things you want to do.  Learn and use relaxation techniques to manage stress and to control your breathing.  Get the right amount of sleep and get quality sleep. Most adults need 7 or more hours per night.  Eat healthy foods. Eating smaller, more frequent meals and resting before meals may help you maintain your strength. Controlled breathing Learn and use controlled breathing techniques as directed by your health care provider. Controlled breathing techniques include:  Pursed lip breathing. Start by breathing in (inhaling) through your nose for 1 second. Then, purse your lips as if you were going to whistle and breathe out (exhale) through the pursed lips for 2 seconds.  Diaphragmatic breathing. Start by putting one hand on your abdomen just above your waist. Inhale slowly through your nose. The hand on your abdomen should move out. Then purse your lips and exhale slowly. You should be able to feel the hand on your abdomen moving in as you exhale.  Controlled coughing Learn and use controlled coughing to clear mucus from your lungs. Controlled coughing is a series of short, progressive coughs. The steps of controlled coughing are: 1. Lean your head slightly forward. 2. Breathe in deeply using diaphragmatic breathing. 3. Try to hold your breath for 3 seconds. 4. Keep your mouth slightly open while coughing  twice. 5. Spit any mucus out into a tissue. 6. Rest and repeat the steps once or twice as needed.  General instructions  Make sure you receive all the vaccines that your health care provider recommends, especially the pneumococcal and influenza vaccines. Preventing infection and hospitalization is very important when you have COPD.  Use oxygen therapy and pulmonary rehabilitation if directed to by your health care provider. If you require home oxygen therapy, ask your health care provider whether you should purchase a pulse oximeter to measure your oxygen level at home.  Work with your health care provider to develop a COPD action plan. This will help you know what steps to take if your condition gets worse.  Keep other chronic health conditions under control as told by your health care provider.  Avoid extreme temperature and humidity changes.  Avoid contact with people who have an illness that spreads from person to person (is contagious), such as viral infections or pneumonia.  Keep all follow-up visits as told by your health care provider. This is important. Contact a health care provider if:  You are coughing up more mucus than usual.  There is a change in the color or thickness of your mucus.  Your breathing is more labored than usual.  Your breathing is faster than usual.  You have difficulty sleeping.  You need to use your rescue medicines or inhalers more often than expected.  You have trouble doing routine activities such as getting dressed or walking around the house. Get help right away if:  You have shortness of breath while you are resting.  You have shortness of breath that prevents you from: ? Being able to talk. ? Performing your usual physical activities.  You have chest pain lasting longer than 5 minutes.  Your skin color is more blue (cyanotic) than usual.  You measure low oxygen saturations for longer than 5 minutes with a pulse oximeter.  You have  a fever.  You feel too tired to breathe normally. Summary  Chronic obstructive pulmonary disease (COPD) is a long-term (chronic) condition that affects the lungs.  Your lung function will probably never return to normal. In most cases, it gets worse over time. However, there are steps you can take to slow the progression of the disease and improve your quality of life.  Treatment for COPD may include taking medicines, quitting smoking, pulmonary rehabilitation, and changes to diet and exercise. As the disease progresses, you may need oxygen therapy, a lung transplant, or palliative care.  To help manage your condition, do not smoke, avoid exposure to things that irritate your lungs, stay up to date on all vaccines, and follow your health care provider's instructions for taking medicines. This information is not intended to replace advice given to you by your health care provider. Make sure you discuss any questions you have with your health care provider. Document Released: 03/19/2005 Document Revised: 07/14/2016 Document Reviewed: 07/14/2016 Elsevier Interactive Patient Education  [employer].

## 2017-09-23 ENCOUNTER — Other Ambulatory Visit: Payer: Self-pay

## 2017-09-23 ENCOUNTER — Encounter: Payer: Self-pay | Admitting: Physician Assistant

## 2017-09-23 DIAGNOSIS — L821 Other seborrheic keratosis: Secondary | ICD-10-CM | POA: Diagnosis not present

## 2017-09-23 DIAGNOSIS — D485 Neoplasm of uncertain behavior of skin: Secondary | ICD-10-CM | POA: Diagnosis not present

## 2017-09-23 DIAGNOSIS — L218 Other seborrheic dermatitis: Secondary | ICD-10-CM | POA: Diagnosis not present

## 2017-09-23 DIAGNOSIS — D229 Melanocytic nevi, unspecified: Secondary | ICD-10-CM | POA: Diagnosis not present

## 2017-09-23 DIAGNOSIS — L814 Other melanin hyperpigmentation: Secondary | ICD-10-CM | POA: Diagnosis not present

## 2017-09-23 DIAGNOSIS — L570 Actinic keratosis: Secondary | ICD-10-CM | POA: Diagnosis not present

## 2017-10-08 ENCOUNTER — Ambulatory Visit
Admission: RE | Admit: 2017-10-08 | Discharge: 2017-10-08 | Disposition: A | Discharge status: Home / Self Care | Payer: Medicare Other | Source: Ambulatory Visit | Attending: Physician Assistant | Admitting: Physician Assistant

## 2017-10-08 DIAGNOSIS — Z87891 Personal history of nicotine dependence: Secondary | ICD-10-CM | POA: Diagnosis not present

## 2017-10-08 DIAGNOSIS — Z122 Encounter for screening for malignant neoplasm of respiratory organs: Secondary | ICD-10-CM

## 2017-11-17 ENCOUNTER — Other Ambulatory Visit: Payer: Self-pay | Admitting: Internal Medicine

## 2017-11-17 DIAGNOSIS — I10 Essential (primary) hypertension: Secondary | ICD-10-CM

## 2017-11-29 ENCOUNTER — Other Ambulatory Visit: Payer: Self-pay | Admitting: Adult Health

## 2018-03-14 ENCOUNTER — Other Ambulatory Visit: Payer: Self-pay | Admitting: Internal Medicine

## 2018-04-01 ENCOUNTER — Ambulatory Visit (INDEPENDENT_AMBULATORY_CARE_PROVIDER_SITE_OTHER): Payer: Medicare Other | Admitting: Internal Medicine

## 2018-04-01 ENCOUNTER — Encounter: Payer: Self-pay | Admitting: Internal Medicine

## 2018-04-01 VITALS — BP 124/76 | HR 60 | Temp 97.9°F | Resp 16 | Ht 68.0 in | Wt 194.0 lb

## 2018-04-01 DIAGNOSIS — E782 Mixed hyperlipidemia: Secondary | ICD-10-CM | POA: Diagnosis not present

## 2018-04-01 DIAGNOSIS — E559 Vitamin D deficiency, unspecified: Secondary | ICD-10-CM | POA: Diagnosis not present

## 2018-04-01 DIAGNOSIS — I10 Essential (primary) hypertension: Secondary | ICD-10-CM

## 2018-04-01 DIAGNOSIS — Z0000 Encounter for general adult medical examination without abnormal findings: Secondary | ICD-10-CM

## 2018-04-01 DIAGNOSIS — N138 Other obstructive and reflux uropathy: Secondary | ICD-10-CM

## 2018-04-01 DIAGNOSIS — Z87891 Personal history of nicotine dependence: Secondary | ICD-10-CM

## 2018-04-01 DIAGNOSIS — N183 Chronic kidney disease, stage 3 unspecified: Secondary | ICD-10-CM

## 2018-04-01 DIAGNOSIS — Z1212 Encounter for screening for malignant neoplasm of rectum: Secondary | ICD-10-CM

## 2018-04-01 DIAGNOSIS — Z136 Encounter for screening for cardiovascular disorders: Secondary | ICD-10-CM | POA: Diagnosis not present

## 2018-04-01 DIAGNOSIS — R7303 Prediabetes: Secondary | ICD-10-CM | POA: Diagnosis not present

## 2018-04-01 DIAGNOSIS — R7309 Other abnormal glucose: Secondary | ICD-10-CM

## 2018-04-01 DIAGNOSIS — N401 Enlarged prostate with lower urinary tract symptoms: Secondary | ICD-10-CM

## 2018-04-01 DIAGNOSIS — Z8249 Family history of ischemic heart disease and other diseases of the circulatory system: Secondary | ICD-10-CM

## 2018-04-01 DIAGNOSIS — Z0001 Encounter for general adult medical examination with abnormal findings: Secondary | ICD-10-CM

## 2018-04-01 DIAGNOSIS — Z1211 Encounter for screening for malignant neoplasm of colon: Secondary | ICD-10-CM

## 2018-04-01 DIAGNOSIS — I700 Atherosclerosis of aorta: Secondary | ICD-10-CM

## 2018-04-01 DIAGNOSIS — Z122 Encounter for screening for malignant neoplasm of respiratory organs: Secondary | ICD-10-CM

## 2018-04-01 DIAGNOSIS — Z79899 Other long term (current) drug therapy: Secondary | ICD-10-CM

## 2018-04-01 DIAGNOSIS — K219 Gastro-esophageal reflux disease without esophagitis: Secondary | ICD-10-CM

## 2018-04-01 DIAGNOSIS — Z125 Encounter for screening for malignant neoplasm of prostate: Secondary | ICD-10-CM

## 2018-04-01 MED ORDER — FAMOTIDINE 40 MG PO TABS: ORAL_TABLET | ORAL | 3 refills | Status: DC

## 2018-04-01 NOTE — Patient Instructions (Addendum)

## 2018-04-01 NOTE — Progress Notes (Signed)
[name]   [name] Park, M.D.     [name] Park. [name] Park, P.A.-C [name] Park, [place]                [address] [place], [place] [address] Telephone [phone] Telefax [phone] Annual  Screening/Preventative Visit  & Comprehensive Evaluation & Examination     This very nice 67 y.o. MWM presents for a Screening /Preventative Visit & comprehensive evaluation and management of multiple medical co-morbidities.  Patient has been followed for HTN, HLD, Prediabetes and Vitamin D Deficiency. Patient's GERD is controlled w/his meds and prudent diet     HTN predates since 2012. Patient's BP has been controlled at home.  Today's BP is at goal - 124/76. Patient denies any cardiac symptoms as chest pain, palpitations, shortness of breath, dizziness or ankle swelling.     Patient's hyperlipidemia is controlled with diet and medications. Patient denies myalgias or other medication SE's. Last lipids were at goal: Lab Results  Component Value Date   CHOL 165 09/22/2017   HDL 56 09/22/2017   LDLCALC 85 09/22/2017   TRIG 143 09/22/2017   CHOLHDL 2.9 09/22/2017      Patient has hx/o prediabetes (A1c 5.8% & 6.3%/2011 and 5.9%/2016) and patient denies reactive hypoglycemic symptoms, visual blurring, diabetic polys or paresthesias. Last A1c was Normal at goal: Lab Results  Component Value Date   HGBA1C 5.5 03/10/2017       Finally, patient has history of Vitamin D Deficiency ("31"/2013) and last vitamin D was at goal" Lab Results  Component Value Date   VD25OH 89 06/11/2017   Current Outpatient Medications on File Prior to Visit  Medication Sig  . albuterol (VENTOLIN HFA) 108 (90 Base) MCG/ACT inhaler Inhale 2 puffs into the lungs every 4 (four) hours as needed for wheezing or shortness of breath.  Marland Kitchen aspirin EC 81 MG tablet Take 81 mg by mouth daily. Take 1 tablet (81 mg) by mouth 3 times week -  Sunday, Wednesday and Friday - with lunch  . atorvastatin (LIPITOR) 40 MG tablet TAKE 1 TABLET BY MOUTH  DAILY  . bisoprolol-hydrochlorothiazide (ZIAC) 5-6.25 MG tablet TAKE 1 TABLET EVERY MORNING FOR BLOOD PRESSURE  . buPROPion (WELLBUTRIN XL) 150 MG 24 hr tablet TAKE 1 TABLET BY MOUTH  DAILY  . Cholecalciferol (VITAMIN D-3) 1000 UNITS CAPS Take 3,000 Units by mouth daily.   . hydrocortisone 2.5 % lotion Apply 1 application topically daily as needed (scalp itching).   . ranitidine (ZANTAC) 150 MG tablet TAKE 1 TABLET BY MOUTH TWO  TIMES DAILY  . VESICARE 10 MG tablet TAKE 1 TABLET BY MOUTH  DAILY (Patient taking differently: TAKE 1/2 TABLET BY MOUTH  DAILY)   No current facility-administered medications on file prior to visit.    No Known Allergies Past Medical History:  Diagnosis Date  . Acute appendicitis 11/24/2016  . COPD (chronic obstructive pulmonary disease) ([place])   . GERD (gastroesophageal reflux disease)   . Hematuria   . HLD (hyperlipidemia)   . Hypertension   . IBS (irritable bowel syndrome)   . Pre-diabetes   . Vitamin D deficiency    Health Maintenance  Topic Date Due  . INFLUENZA VACCINE  01/21/2018  . TETANUS/TDAP  04/28/2021  . COLONOSCOPY  02/27/2026  . Hepatitis C Screening  Completed  . PNA vac Low Risk Adult  Completed  Immunization History  Administered Date(s) Administered  . Influenza Whole 04/07/2013  . Influenza, High Dose Seasonal PF 04/13/2014, 03/27/2015, 03/25/2016  . PPD Test 12/05/2013  . Pneumococcal Conjugate-13 06/08/2014  . Pneumococcal Polysaccharide-23 09/07/2014  . Tdap 04/29/2011  . Zoster 06/12/2014   Last Colon - 02/28/2016 -  Dr Fuller Plan - recc 10 yr f/u due Sept 2027  Past Surgical History:  Procedure Laterality Date  . CARPAL TUNNEL RELEASE Right   . CERVICAL FUSION    . HAND SURGERY Left   . LAPAROSCOPIC APPENDECTOMY N/A 11/24/2016   Procedure: APPENDECTOMY LAPAROSCOPIC;  Surgeon: Clovis Riley, MD;  Location: [facility] OR;   Service: General;  Laterality: N/A;  . TRIGGER FINGER RELEASE Right    thumb   Family History  Problem Relation Age of Onset  . Irritable bowel syndrome Mother   . Diverticulosis Mother   . Lupus Mother   . COPD Mother   . Hypertension Mother   . Lung cancer Father   . Other Father        bladder polyps  . Stroke Father   . Kidney disease Father   . Cancer Father   . Colon cancer Neg Hx   . Esophageal cancer Neg Hx   . Rectal cancer Neg Hx   . Stomach cancer Neg Hx    Social History   Socioeconomic History  . Marital status: Married    Spouse name: [name]  . Number of children: 1 daughter - 2 GC & 3rd step GC  . Years of education: Not on file  . Highest education level: Not on file  Occupational History  . Occupation: retired  Tobacco Use  . Smoking status: Former Smoker    Packs/day: 1.50    Years: 35.00    Pack years: 52.50    Types: Cigarettes    Last attempt to quit: 02/05/2004    Years since quitting: 14.1  . Smokeless tobacco: Never Used  Substance and Sexual Activity  . Alcohol use: Yes    Comment: seldom  . Drug use: No  . Sexual activity: Not on file    ROS Constitutional: Denies fever, chills, weight loss/gain, headaches, insomnia,  night sweats or change in appetite. Does c/o fatigue. Eyes: Denies redness, blurred vision, diplopia, discharge, itchy or watery eyes.  ENT: Denies discharge, congestion, post nasal drip, epistaxis, sore throat, earache, hearing loss, dental pain, Tinnitus, Vertigo, Sinus pain or snoring.  Cardio: Denies chest pain, palpitations, irregular heartbeat, syncope, dyspnea, diaphoresis, orthopnea, PND, claudication or edema Respiratory: denies cough, dyspnea, DOE, pleurisy, hoarseness, laryngitis or wheezing.  Gastrointestinal: Denies dysphagia, heartburn, reflux, water brash, pain, cramps, nausea, vomiting, bloating, diarrhea, constipation, hematemesis, melena, hematochezia, jaundice or hemorrhoids Genitourinary: Denies dysuria,  frequency, urgency, nocturia, hesitancy, discharge, hematuria or flank pain Musculoskeletal: Denies arthralgia, myalgia, stiffness, Jt. Swelling, pain, limp or strain/sprain. Denies Falls. Skin: Denies puritis, rash, hives, warts, acne, eczema or change in skin lesion Neuro: No weakness, tremor, incoordination, spasms, paresthesia or pain Psychiatric: Denies confusion, memory loss or sensory loss. Denies Depression. Endocrine: Denies change in weight, skin, hair change, nocturia, and paresthesia, diabetic polys, visual blurring or hyper / hypo glycemic episodes.  Heme/Lymph: No excessive bleeding, bruising or enlarged lymph nodes.  Physical Exam  BP 124/76   Pulse 60   Temp 97.9 F (36.6 C)   Resp 16   Ht 5\' 8"  (1.727 m)   Wt 194 lb (88 kg)   BMI 29.50 kg/m   General Appearance: Well nourished and well  groomed and in no apparent distress.  Eyes: PERRLA, EOMs, conjunctiva no swelling or erythema, normal fundi and vessels. Sinuses: No frontal/maxillary tenderness ENT/Mouth: EACs patent / TMs  nl. Nares clear without erythema, swelling, mucoid exudates. Oral hygiene is good. No erythema, swelling, or exudate. Tongue normal, non-obstructing. Tonsils not swollen or erythematous. Hearing normal.  Neck: Supple, thyroid not palpable. No bruits, nodes or JVD. Respiratory: Respiratory effort normal.  BS equal and clear bilateral without rales, rhonci, wheezing or stridor. Cardio: Heart sounds are normal with regular rate and rhythm and no murmurs, rubs or gallops. Peripheral pulses are normal and equal bilaterally without edema. No aortic or femoral bruits. Chest: symmetric with normal excursions and percussion.  Abdomen: Soft, with Nl bowel sounds. Nontender, no guarding, rebound, hernias, masses, or organomegaly.  Lymphatics: Non tender without lymphadenopathy.  Genitourinary: No hernias.Testes nl. DRE - prostate nl for age - smooth & firm w/o nodules. Musculoskeletal: Full ROM all peripheral  extremities, joint stability, 5/5 strength, and normal gait. Skin: Warm and dry without rashes, lesions, cyanosis, clubbing or  ecchymosis.  Neuro: Cranial nerves intact, reflexes equal bilaterally. Normal muscle tone, no cerebellar symptoms. Sensation intact.  Pysch: Alert and oriented X 3 with normal affect, insight and judgment appropriate.   Assessment and Plan  1. Annual Preventative/Screening Exam   2. Essential hypertension  - EKG 12-Lead - [place], RETROPERITNL ABD,  LTD - Urinalysis, Routine w reflex microscopic - Microalbumin / creatinine urine ratio - CBC with Differential/Platelet - COMPLETE METABOLIC PANEL WITH GFR - Magnesium - TSH  3. Hyperlipidemia, mixed  - EKG 12-Lead - [place], RETROPERITNL ABD,  LTD - Lipid panel - TSH  4. Abnormal glucose  - EKG 12-Lead - [place], RETROPERITNL ABD,  LTD - Hemoglobin A1c - Insulin, random  5. Vitamin D deficiency  - VITAMIN D 25 Hydroxyl  6. Prediabetes  - EKG 12-Lead - [place], RETROPERITNL ABD,  LTD - Hemoglobin A1c - Insulin, random  7. Chronic kidney disease, stage 3 (HCC)  - COMPLETE METABOLIC PANEL WITH GFR  8. Gastroesophageal reflux disease  - CBC with Differential/Platelet  9. Screening for colorectal cancer  - POC Hemoccult Bld/Stl  10. BPH with obstruction/lower urinary tract symptoms  - PSA  11. Prostate cancer screening  - PSA  12. Encounter for screening for malignant neoplasm of respiratory organs   13. Screening for ischemic heart disease  - EKG 12-Lead  14. Thoracic aorta atherosclerosis (HCC)  - EKG 12-Lead - [place], RETROPERITNL ABD,  LTD  15. Screening for AAA (aortic abdominal aneurysm)  - [place], RETROPERITNL ABD,  LTD  16. FH: hypertension  - EKG 12-Lead - [place], RETROPERITNL ABD,  LTD  17. Former smoker (54 pack year)  - EKG 12-Lead - [place], RETROPERITNL ABD,  LTD  18. Medication management  - Urinalysis, Routine w reflex microscopic - Microalbumin / creatinine urine ratio - CBC  with Differential/Platelet - COMPLETE METABOLIC PANEL WITH GFR - Magnesium - Lipid panel - TSH - Hemoglobin A1c - Insulin, random - VITAMIN D 25 Hydroxyl      Patient was counseled in prudent diet, weight control to achieve/maintain BMI less than 25, BP monitoring, regular exercise and medications as discussed.  Discussed med effects and SE's. Routine screening labs and tests as requested with regular follow-up as recommended. Over 40 minutes of exam, counseling, chart review and high complex critical decision making was performed

## 2018-04-02 LAB — INSULIN, RANDOM: INSULIN: 1.9 u[IU]/mL — AB (ref 2.0–19.6)

## 2018-04-02 LAB — CBC WITH DIFFERENTIAL/PLATELET
BASOS PCT: 0.6 %
Basophils Absolute: 38 cells/uL (ref 0–200)
EOS PCT: 3.3 %
Eosinophils Absolute: 211 cells/uL (ref 15–500)
HEMATOCRIT: 38.6 % (ref 38.5–50.0)
HEMOGLOBIN: 13.2 g/dL (ref 13.2–17.1)
LYMPHS ABS: 1402 {cells}/uL (ref 850–3900)
MCH: 30.6 pg (ref 27.0–33.0)
MCHC: 34.2 g/dL (ref 32.0–36.0)
MCV: 89.6 fL (ref 80.0–100.0)
MPV: 11.3 fL (ref 7.5–12.5)
Monocytes Relative: 10.2 %
NEUTROS ABS: 4096 {cells}/uL (ref 1500–7800)
Neutrophils Relative %: 64 %
Platelets: 261 10*3/uL (ref 140–400)
RBC: 4.31 10*6/uL (ref 4.20–5.80)
RDW: 12.5 % (ref 11.0–15.0)
Total Lymphocyte: 21.9 %
WBC mixed population: 653 cells/uL (ref 200–950)
WBC: 6.4 10*3/uL (ref 3.8–10.8)

## 2018-04-02 LAB — LIPID PANEL
Cholesterol: 134 mg/dL (ref <200)
HDL: 50 mg/dL (ref >40)
LDL Cholesterol (Calc): 67 mg/dL (calc)
Non-HDL Cholesterol (Calc): 84 mg/dL (calc) (ref <130)
TRIGLYCERIDES: 88 mg/dL (ref <150)
Total CHOL/HDL Ratio: 2.7 (calc) (ref <5.0)

## 2018-04-02 LAB — URINALYSIS, ROUTINE W REFLEX MICROSCOPIC
BILIRUBIN URINE: NEGATIVE
Bacteria, UA: NONE SEEN /HPF
GLUCOSE, UA: NEGATIVE
HGB URINE DIPSTICK: NEGATIVE
Hyaline Cast: NONE SEEN /LPF
KETONES UR: NEGATIVE
NITRITE: NEGATIVE
PH: 6.5 (ref 5.0–8.0)
PROTEIN: NEGATIVE
RBC / HPF: NONE SEEN /HPF (ref 0–2)
Specific Gravity, Urine: 1.014 (ref 1.001–1.03)
WBC UA: NONE SEEN /HPF (ref 0–5)

## 2018-04-02 LAB — COMPLETE METABOLIC PANEL WITH GFR
AG Ratio: 1.6 (calc) (ref 1.0–2.5)
ALT: 24 U/L (ref 9–46)
AST: 24 U/L (ref 10–35)
Albumin: 4.2 g/dL (ref 3.6–5.1)
Alkaline phosphatase (APISO): 94 U/L (ref 40–115)
BUN/Creatinine Ratio: 14 (calc) (ref 6–22)
BUN: 19 mg/dL (ref 7–25)
CALCIUM: 9.5 mg/dL (ref 8.6–10.3)
CHLORIDE: 102 mmol/L (ref 98–110)
CO2: 27 mmol/L (ref 20–32)
Creat: 1.37 mg/dL — ABNORMAL HIGH (ref 0.70–1.25)
GFR, Est African American: 61 mL/min/{1.73_m2} (ref >=60)
GFR, Est Non African American: 53 mL/min/{1.73_m2} — ABNORMAL LOW (ref >=60)
GLOBULIN: 2.6 g/dL (ref 1.9–3.7)
Glucose, Bld: 86 mg/dL (ref 65–99)
Potassium: 4.3 mmol/L (ref 3.5–5.3)
Sodium: 139 mmol/L (ref 135–146)
Total Bilirubin: 0.5 mg/dL (ref 0.2–1.2)
Total Protein: 6.8 g/dL (ref 6.1–8.1)

## 2018-04-02 LAB — TSH: TSH: 0.82 m[IU]/L (ref 0.40–4.50)

## 2018-04-02 LAB — HEMOGLOBIN A1C
HEMOGLOBIN A1C: 5.8 %{Hb} — AB (ref <5.7)
MEAN PLASMA GLUCOSE: 120 (calc)
eAG (mmol/L): 6.6 (calc)

## 2018-04-02 LAB — MICROALBUMIN / CREATININE URINE RATIO
Creatinine, Urine: 111 mg/dL (ref 20–320)
Microalb Creat Ratio: 11 mcg/mg creat (ref <30)
Microalb, Ur: 1.2 mg/dL

## 2018-04-02 LAB — PSA: PSA: 0.8 ng/mL (ref <=4.0)

## 2018-04-02 LAB — VITAMIN D 25 HYDROXY (VIT D DEFICIENCY, FRACTURES): Vit D, 25-Hydroxy: 82 ng/mL (ref 30–100)

## 2018-04-02 LAB — MAGNESIUM: MAGNESIUM: 1.9 mg/dL (ref 1.5–2.5)

## 2018-04-04 ENCOUNTER — Encounter: Payer: Self-pay | Admitting: Internal Medicine

## 2018-04-08 ENCOUNTER — Ambulatory Visit (INDEPENDENT_AMBULATORY_CARE_PROVIDER_SITE_OTHER): Payer: Medicare Other

## 2018-04-08 DIAGNOSIS — Z23 Encounter for immunization: Secondary | ICD-10-CM | POA: Diagnosis not present

## 2018-04-19 ENCOUNTER — Ambulatory Visit (INDEPENDENT_AMBULATORY_CARE_PROVIDER_SITE_OTHER): Payer: Medicare Other | Admitting: Adult Health

## 2018-04-19 ENCOUNTER — Encounter: Payer: Self-pay | Admitting: Adult Health

## 2018-04-19 VITALS — BP 122/72 | HR 80 | Temp 96.8°F | Ht 68.0 in | Wt 194.2 lb

## 2018-04-19 DIAGNOSIS — M62830 Muscle spasm of back: Secondary | ICD-10-CM

## 2018-04-19 DIAGNOSIS — M545 Low back pain, unspecified: Secondary | ICD-10-CM

## 2018-04-19 MED ORDER — METHOCARBAMOL 500 MG PO TABS: 500.0000 mg | ORAL_TABLET | Freq: Four times a day (QID) | ORAL | 0 refills | Status: DC

## 2018-04-19 MED ORDER — MELOXICAM 15 MG PO TABS: ORAL_TABLET | ORAL | 1 refills | Status: DC

## 2018-04-19 NOTE — Progress Notes (Signed)
Assessment and Plan:  [name] Park was seen today for back pain.  Diagnoses and all orders for this visit:  Acute left-sided low back pain without sciatica/ Lumbar paraspinal muscle spasm - negative straight leg Prescribed NSAIDs, muscle relaxer, RICE, and exercise given Hx of self-limited recurrent episodes If not better follow up in office or will refer to PT/orthopedics. Natural history and expected course discussed. Questions answered. Neurosurgeon distributed. Proper lifting, bending technique discussed. Stretching exercises discussed. Short (2-4 day) period of relative rest recommended until acute symptoms improve. Heat to affected area as needed for local pain relief. -     methocarbamol (ROBAXIN) 500 MG tablet; Take 1 tablet (500 mg total) by mouth 4 (four) times daily. -     meloxicam (MOBIC) 15 MG tablet; Take one daily with food for 2 weeks, can take with tylenol, can not take with aleve, iburpofen, then as needed daily for pain   Further disposition pending results of labs. Discussed med's effects and SE's.   Over 15 minutes of exam, counseling, chart review, and critical decision making was performed.   Future Appointments  Date Time Provider [place]  07/05/2018 10:45 AM Liane Comber, NP [facility] None  10/06/2018 11:00 AM Unk Pinto, MD [facility] None  04/14/2019  9:00 AM Unk Pinto, MD [facility] None    ------------------------------------------------------------------------------------------------------------------   HPI BP 122/72   Pulse 80   Temp (!) 96.8 F (36 C)   Ht 5\' 8"  (1.727 m)   Wt 194 lb 3.2 oz (88.1 kg)   SpO2 98%   BMI 29.53 kg/m   67 y.o.male presents accompanied by his wife for evaluation of lower back pain x 1 weeks that began while on vacation after moving luggage up steps; he reports he woke up with L lumbar back pain, denies radiating pains. He has hx of recurrent self-limited episodes of lower back pain, last  saw Dr. Erlinda Hong 1 year ago. Reports feels tight and achy, constant, 7/10, reports tylenol helps some for a few hours (325mg ). Has not tried heat or cold. Denies fever/chills, parasthesias, LE weakenss, loss of bowel or bladder control, changes in gait.    Past Medical History:  Diagnosis Date  . Acute appendicitis 11/24/2016  . COPD (chronic obstructive pulmonary disease) ([place])   . GERD (gastroesophageal reflux disease)   . Hematuria   . HLD (hyperlipidemia)   . Hypertension   . IBS (irritable bowel syndrome)   . Pre-diabetes   . Vitamin D deficiency      No Known Allergies  Current Outpatient Medications on File Prior to Visit  Medication Sig  . albuterol (VENTOLIN HFA) 108 (90 Base) MCG/ACT inhaler Inhale 2 puffs into the lungs every 4 (four) hours as needed for wheezing or shortness of breath.  Marland Kitchen aspirin EC 81 MG tablet Take 81 mg by mouth daily. Take 1 tablet (81 mg) by mouth 3 times week - Sunday, Wednesday and Friday - with lunch  . atorvastatin (LIPITOR) 40 MG tablet TAKE 1 TABLET BY MOUTH  DAILY  . bisoprolol-hydrochlorothiazide (ZIAC) 5-6.25 MG tablet TAKE 1 TABLET EVERY MORNING FOR BLOOD PRESSURE  . buPROPion (WELLBUTRIN XL) 150 MG 24 hr tablet TAKE 1 TABLET BY MOUTH  DAILY  . Cholecalciferol (VITAMIN D-3) 1000 UNITS CAPS Take 3,000 Units by mouth daily.   . famotidine (PEPCID) 40 MG tablet Take 1 tablet at bedtime for Acid Reflux  . hydrocortisone 2.5 % lotion Apply 1 application topically daily as needed (scalp itching).   . VESICARE 10  MG tablet TAKE 1 TABLET BY MOUTH  DAILY (Patient taking differently: TAKE 1/2 TABLET BY MOUTH  DAILY)   No current facility-administered medications on file prior to visit.     ROS: all negative except above.   Physical Exam:  BP 122/72   Pulse 80   Temp (!) 96.8 F (36 C)   Ht 5\' 8"  (1.727 m)   Wt 194 lb 3.2 oz (88.1 kg)   SpO2 98%   BMI 29.53 kg/m   General Appearance: Well nourished, in no acute distress, appears  uncomfortable Eyes: conjunctiva no swelling or erythema ENT/Mouth: Hearing normal.  Neck: Supple  Respiratory: Respiratory effort normal  Cardio: Brisk peripheral pulses without edema.  Abdomen: Soft, + BS.  Non tender, no guarding, rebound, hernias, masses. No CVA tenderness. Lymphatics: Non tender without lymphadenopathy.  Musculoskeletal: Patient is able to ambulate well. Gait is  Antalgic. Straight leg raising with dorsiflexion negative bilaterally for radicular symptoms. Sensory exam in the legs are normal. Knee reflexes are normal Ankle reflexes are normal Strength is normal and symmetric in arms and legs. There is not SI tenderness to palpation.  There is paraspinal muscle spasm.  There is not midline tenderness.  ROM of spine with  limited in all spheres due to pain.  Skin: Warm, dry without rashes, lesions, ecchymosis.  Neuro:  Normal muscle tone, Sensation intact.  Psych: Awake and oriented X 3, normal affect, Insight and Judgment appropriate.     Izora Ribas, NP 3:20 PM [facility]

## 2018-04-19 NOTE — Patient Instructions (Signed)
Low Back Strain A strain is a stretch or tear in a muscle or the strong cords of tissue that attach muscle to bone (tendons). Strains of the lower back (lumbar spine) are a common cause of low back pain. A strain occurs when muscles or tendons are torn or are stretched beyond their limits. The muscles may become inflamed, resulting in pain and sudden muscle tightening (spasms). A strain can happen suddenly due to an injury (trauma), or it can develop gradually due to overuse. There are three types of strains:  Grade 1 is a mild strain involving a minor tear of the muscle fibers or tendons. This may cause some pain but no loss of muscle strength.  Grade 2 is a moderate strain involving a partial tear of the muscle fibers or tendons. This causes more severe pain and some loss of muscle strength.  Grade 3 is a severe strain involving a complete tear of the muscle or tendon. This causes severe pain and complete or nearly complete loss of muscle strength. What are the causes? This condition may be caused by:  Trauma, such as a fall or a hit to the body.  Twisting or overstretching the back. This may result from doing activities that require a lot of energy, such as lifting heavy objects. What increases the risk? The following factors may increase your risk of getting this condition:  Playing contact sports.  Participating in sports or activities that put excessive stress on the back and require a lot of bending and twisting, including:  Lifting weights or heavy objects.  Gymnastics.  Soccer.  Figure skating.  Snowboarding.  Being overweight or obese.  Having poor strength and flexibility. What are the signs or symptoms? Symptoms of this condition may include:  Sharp or dull pain in the lower back that does not go away. Pain may extend to the buttocks.  Stiffness.  Limited range of motion.  Inability to stand up straight due to stiffness or pain.  Muscle spasms. How is this  diagnosed?   This condition may be diagnosed based on:  Your symptoms.  Your medical history.  A physical exam.  Your health care provider may push on certain areas of your back to determine the source of your pain.  You may be asked to bend forward, backward, and side to side to assess the severity of your pain and your range of motion.  Imaging tests, such as:  X-rays.  MRI. How is this treated? Treatment for this condition may include:  Applying heat and cold to the affected area.  Medicines to help relieve pain and to relax your muscles (muscle relaxants).  NSAIDs to help reduce swelling and discomfort.  Physical therapy. When your symptoms improve, it is important to gradually return to your normal routine as soon as possible to reduce pain, avoid stiffness, and avoid loss of muscle strength. Generally, symptoms should improve within 6 weeks of treatment. However, recovery time varies. Follow these instructions at home: Managing pain, stiffness, and swelling  If directed, apply ice to the injured area during the first 24 hours after your injury.  Put ice in a plastic bag.  Place a towel between your skin and the bag.  Leave the ice on for 20 minutes, 2-3 times a day.  If directed, apply heat to the affected area as often as told by your health care provider. Use the heat source that your health care provider recommends, such as a moist heat pack or a heating pad.    or a heating pad. ? Place a towel between your skin and the heat source. ? Leave the heat on for 20-30 minutes. ? Remove the heat if your skin turns bright red. This is especially important if you are unable to feel pain, heat, or cold. You may have a greater risk of getting burned. Activity  Rest and return to your normal activities as told by your health care provider. Ask your health care provider what activities are safe for you.  Avoid activities that take a lot of effort (are strenuous) for as long as told  by your health care provider.  Do exercises as told by your health care provider. General instructions   Take over-the-counter and prescription medicines only as told by your health care provider.  If you have questions or concerns about safety while taking pain medicine, talk with your health care provider.  Do not drive or operate heavy machinery until you know how your pain medicine affects you.  Do not use any tobacco products, such as cigarettes, chewing tobacco, and e-cigarettes. Tobacco can delay bone healing. If you need help quitting, ask your health care provider.  Keep all follow-up visits as told by your health care provider. This is important. How is this prevented?  Warm up and stretch before being active.  Cool down and stretch after being active.  Give your body time to rest between periods of activity.  Avoid: ? Being physically inactive for long periods at a time. ? Exercising or playing sports when you are tired or in pain.  Use correct form when playing sports and lifting heavy objects.  Use good posture when sitting and standing.  Maintain a healthy weight.  Sleep on a mattress with medium firmness to support your back.  Make sure to use equipment that fits you, including shoes that fit well.  Be safe and responsible while being active to avoid falls.  Do at least 150 minutes of moderate-intensity exercise each week, such as brisk walking or water aerobics. Try a form of exercise that takes stress off your back, such as swimming or stationary cycling.  Maintain physical fitness, including: ? Strength. ? Flexibility. ? Cardiovascular fitness. ? Endurance. Contact a health care provider if:  Your back pain does not improve after 6 weeks of treatment.  Your symptoms get worse. Get help right away if:  Your back pain is severe.  You are unable to stand or walk.  You develop pain in your legs.  You develop weakness in your buttocks or  legs.  You have difficulty controlling when you urinate or when you have a bowel movement. This information is not intended to replace advice given to you by your health care provider. Make sure you discuss any questions you have with your health care provider. Document Released: 06/09/2005 Document Revised: 02/14/2016 Document Reviewed: 03/21/2015 Elsevier Interactive Patient Education  2017 [place].    Back Exercises The following exercises strengthen the muscles that help to support the back. They also help to keep the lower back flexible. Doing these exercises can help to prevent back pain or lessen existing pain. If you have back pain or discomfort, try doing these exercises 2-3 times each day or as told by your health care provider. When the pain goes away, do them once each day, but increase the number of times that you repeat the steps for each exercise (do more repetitions). If you do not have back pain or discomfort, do these exercises once each  day or as told by your health care provider. Exercises Single Knee to Chest  Repeat these steps 3-5 times for each leg: 1. Lie on your back on a firm bed or the floor with your legs extended. 2. Bring one knee to your chest. Your other leg should stay extended and in contact with the floor. 3. Hold your knee in place by grabbing your knee or thigh. 4. Pull on your knee until you feel a gentle stretch in your lower back. 5. Hold the stretch for 10-30 seconds. 6. Slowly release and straighten your leg.  Pelvic Tilt  Repeat these steps 5-10 times: 1. Lie on your back on a firm bed or the floor with your legs extended. 2. Bend your knees so they are pointing toward the ceiling and your feet are flat on the floor. 3. Tighten your lower abdominal muscles to press your lower back against the floor. This motion will tilt your pelvis so your tailbone points up toward the ceiling instead of pointing to your feet or the floor. 4. With gentle  tension and even breathing, hold this position for 5-10 seconds.  Cat-Cow  Repeat these steps until your lower back becomes more flexible: 1. Get into a hands-and-knees position on a firm surface. Keep your hands under your shoulders, and keep your knees under your hips. You may place padding under your knees for comfort. 2. Let your head hang down, and point your tailbone toward the floor so your lower back becomes rounded like the back of a cat. 3. Hold this position for 5 seconds. 4. Slowly lift your head and point your tailbone up toward the ceiling so your back forms a sagging arch like the back of a cow. 5. Hold this position for 5 seconds.  Press-Ups  Repeat these steps 5-10 times: 1. Lie on your abdomen (face-down) on the floor. 2. Place your palms near your head, about shoulder-width apart. 3. While you keep your back as relaxed as possible and keep your hips on the floor, slowly straighten your arms to raise the top half of your body and lift your shoulders. Do not use your back muscles to raise your upper torso. You may adjust the placement of your hands to make yourself more comfortable. 4. Hold this position for 5 seconds while you keep your back relaxed. 5. Slowly return to lying flat on the floor.  Bridges  Repeat these steps 10 times: 1. Lie on your back on a firm surface. 2. Bend your knees so they are pointing toward the ceiling and your feet are flat on the floor. 3. Tighten your buttocks muscles and lift your buttocks off of the floor until your waist is at almost the same height as your knees. You should feel the muscles working in your buttocks and the back of your thighs. If you do not feel these muscles, slide your feet 1-2 inches farther away from your buttocks. 4. Hold this position for 3-5 seconds. 5. Slowly lower your hips to the starting position, and allow your buttocks muscles to relax completely.  If this exercise is too easy, try doing it with your arms  crossed over your chest. Abdominal Crunches  Repeat these steps 5-10 times: 1. Lie on your back on a firm bed or the floor with your legs extended. 2. Bend your knees so they are pointing toward the ceiling and your feet are flat on the floor. 3. Cross your arms over your chest. 4. Tip your chin slightly  toward your chest without bending your neck. 5. Tighten your abdominal muscles and slowly raise your trunk (torso) high enough to lift your shoulder blades a tiny bit off of the floor. Avoid raising your torso higher than that, because it can put too much stress on your low back and it does not help to strengthen your abdominal muscles. 6. Slowly return to your starting position.  Back Lifts Repeat these steps 5-10 times: 1. Lie on your abdomen (face-down) with your arms at your sides, and rest your forehead on the floor. 2. Tighten the muscles in your legs and your buttocks. 3. Slowly lift your chest off of the floor while you keep your hips pressed to the floor. Keep the back of your head in line with the curve in your back. Your eyes should be looking at the floor. 4. Hold this position for 3-5 seconds. 5. Slowly return to your starting position.  Contact a health care provider if:  Your back pain or discomfort gets much worse when you do an exercise.  Your back pain or discomfort does not lessen within 2 hours after you exercise. If you have any of these problems, stop doing these exercises right away. Do not do them again unless your health care provider says that you can. Get help right away if:  You develop sudden, severe back pain. If this happens, stop doing the exercises right away. Do not do them again unless your health care provider says that you can. This information is not intended to replace advice given to you by your health care provider. Make sure you discuss any questions you have with your health care provider. Document Released: 07/17/2004 Document Revised:  10/17/2015 Document Reviewed: 08/03/2014 Elsevier Interactive Patient Education  2017 [place] tablets What is this medicine? METHOCARBAMOL (meth oh KAR ba mole) helps to relieve pain and stiffness in muscles caused by strains, sprains, or other injury to your muscles. This medicine may be used for other purposes; ask your health care provider or pharmacist if you have questions. COMMON BRAND NAME(S): Robaxin What should I tell my health care provider before I take this medicine? They need to know if you have any of these conditions: -kidney disease -seizures -an unusual or allergic reaction to methocarbamol, other medicines, foods, dyes, or preservatives -pregnant or trying to get pregnant -breast-feeding How should I use this medicine? Take this medicine by mouth with a full glass of water. Follow the directions on the prescription label. Take your medicine at regular intervals. Do not take your medicine more often than directed. Talk to your pediatrician regarding the use of this medicine in children. Special care may be needed. Overdosage: If you think you have taken too much of this medicine contact a poison control center or emergency room at once. NOTE: This medicine is only for you. Do not share this medicine with others. What if I miss a dose? If you miss a dose, take it as soon as you can. If it is almost time for your next dose, take only the next dose. Do not take double or extra doses. What may interact with this medicine? Do not take this medication with any of the following medicines: -narcotic medicines for cough This medicine may also interact with the following medications: -alcohol -antihistamines for allergy, cough and cold -certain medicines for anxiety or sleep -certain medicines for depression like amitriptyline, fluoxetine, sertraline -certain medicines for seizures like phenobarbital, primidone -cholinesterase inhibitors like  neostigmine,  ambenonium, and pyridostigmine bromide -general anesthetics like halothane, isoflurane, methoxyflurane, propofol -local anesthetics like lidocaine, pramoxine, tetracaine -medicines that relax muscles for surgery -narcotic medicines for pain -phenothiazines like chlorpromazine, mesoridazine, prochlorperazine, thioridazine This list may not describe all possible interactions. Give your health care provider a list of all the medicines, herbs, non-prescription drugs, or dietary supplements you use. Also tell them if you smoke, drink alcohol, or use illegal drugs. Some items may interact with your medicine. What should I watch for while using this medicine? Tell your doctor or health care professional if your symptoms do not start to get better or if they get worse. You may get drowsy or dizzy. Do not drive, use machinery, or do anything that needs mental alertness until you know how this medicine affects you. Do not stand or sit up quickly, especially if you are an older patient. This reduces the risk of dizzy or fainting spells. Alcohol may interfere with the effect of this medicine. Avoid alcoholic drinks. If you are taking another medicine that also causes drowsiness, you may have more side effects. Give your health care provider a list of all medicines you use. Your doctor will tell you how much medicine to take. Do not take more medicine than directed. Call emergency for help if you have problems breathing or unusual sleepiness. What side effects may I notice from receiving this medicine? Side effects that you should report to your doctor or health care professional as soon as possible: -allergic reactions like skin rash, itching or hives, swelling of the face, lips, or tongue -breathing problems -confusion -seizures -unusually weak or tired Side effects that usually do not require medical attention (report to your doctor or health care professional if they continue or are  bothersome): -dizziness -headache -metallic taste -tiredness -upset stomach This list may not describe all possible side effects. Call your doctor for medical advice about side effects. You may report side effects to FDA at 1-800-FDA-1088. Where should I keep my medicine? Keep out of the reach of children. Store at room temperature between 20 and 25 degrees C (68 and 77 degrees F). Keep container tightly closed. Throw away any unused medicine after the expiration date. NOTE: This sheet is a summary. It may not cover all possible information. If you have questions about this medicine, talk to your doctor, pharmacist, or health care provider.  2018 Elsevier/Gold Standard (2015-03-20 13:11:54)    Meloxicam tablets What is this medicine? MELOXICAM (mel OX i cam) is a non-steroidal anti-inflammatory drug (NSAID). It is used to reduce swelling and to treat pain. It may be used for osteoarthritis, rheumatoid arthritis, or juvenile rheumatoid arthritis. This medicine may be used for other purposes; ask your health care provider or pharmacist if you have questions. COMMON BRAND NAME(S): Mobic What should I tell my health care provider before I take this medicine? They need to know if you have any of these conditions: -bleeding disorders -cigarette smoker -coronary artery bypass graft (CABG) surgery within the past 2 weeks -drink more than 3 alcohol-containing drinks per day -heart disease -high blood pressure -history of stomach bleeding -kidney disease -liver disease -lung or breathing disease, like asthma -stomach or intestine problems -an unusual or allergic reaction to meloxicam, aspirin, other NSAIDs, other medicines, foods, dyes, or preservatives -pregnant or trying to get pregnant -breast-feeding How should I use this medicine? Take this medicine by mouth with a full glass of water. Follow the directions on the prescription label. You can take it with or without  food. If it upsets  your stomach, take it with food. Take your medicine at regular intervals. Do not take it more often than directed. Do not stop taking except on your doctor's advice. A special MedGuide will be given to you by the pharmacist with each prescription and refill. Be sure to read this information carefully each time. Talk to your pediatrician regarding the use of this medicine in children. While this drug may be prescribed for selected conditions, precautions do apply. Patients over 22 years old may have a stronger reaction and need a smaller dose. Overdosage: If you think you have taken too much of this medicine contact a poison control center or emergency room at once. NOTE: This medicine is only for you. Do not share this medicine with others. What if I miss a dose? If you miss a dose, take it as soon as you can. If it is almost time for your next dose, take only that dose. Do not take double or extra doses. What may interact with this medicine? Do not take this medicine with any of the following medications: -cidofovir -ketorolac This medicine may also interact with the following medications: -aspirin and aspirin-like medicines -certain medicines for blood pressure, heart disease, irregular heart beat -certain medicines for depression, anxiety, or psychotic disturbances -certain medicines that treat or prevent blood clots like warfarin, enoxaparin, dalteparin, apixaban, dabigatran, rivaroxaban -cyclosporine -digoxin -diuretics -methotrexate -other NSAIDs, medicines for pain and inflammation, like ibuprofen and naproxen -pemetrexed This list may not describe all possible interactions. Give your health care provider a list of all the medicines, herbs, non-prescription drugs, or dietary supplements you use. Also tell them if you smoke, drink alcohol, or use illegal drugs. Some items may interact with your medicine. What should I watch for while using this medicine? Tell your doctor or healthcare  professional if your symptoms do not start to get better or if they get worse. Do not take other medicines that contain aspirin, ibuprofen, or naproxen with this medicine. Side effects such as stomach upset, nausea, or ulcers may be more likely to occur. Many medicines available without a prescription should not be taken with this medicine. This medicine can cause ulcers and bleeding in the stomach and intestines at any time during treatment. This can happen with no warning and may cause death. There is increased risk with taking this medicine for a long time. Smoking, drinking alcohol, older age, and poor health can also increase risks. Call your doctor right away if you have stomach pain or blood in your vomit or stool. This medicine does not prevent heart attack or stroke. In fact, this medicine may increase the chance of a heart attack or stroke. The chance may increase with longer use of this medicine and in people who have heart disease. If you take aspirin to prevent heart attack or stroke, talk with your doctor or health care professional. What side effects may I notice from receiving this medicine? Side effects that you should report to your doctor or health care professional as soon as possible: -allergic reactions like skin rash, itching or hives, swelling of the face, lips, or tongue -nausea, vomiting -signs and symptoms of a blood clot such as breathing problems; changes in vision; chest pain; severe, sudden headache; pain, swelling, warmth in the leg; trouble speaking; sudden numbness or weakness of the face, arm, or leg -signs and symptoms of bleeding such as bloody or black, tarry stools; red or dark-brown urine; spitting up blood or brown material  that looks like coffee grounds; red spots on the skin; unusual bruising or bleeding from the eye, gums, or nose -signs and symptoms of liver injury like dark yellow or brown urine; general ill feeling or flu-like symptoms; light-colored stools;  loss of appetite; nausea; right upper belly pain; unusually weak or tired; yellowing of the eyes or skin -signs and symptoms of stroke like changes in vision; confusion; trouble speaking or understanding; severe headaches; sudden numbness or weakness of the face, arm, or leg; trouble walking; dizziness; loss of balance or coordination Side effects that usually do not require medical attention (report to your doctor or health care professional if they continue or are bothersome): -constipation -diarrhea -gas This list may not describe all possible side effects. Call your doctor for medical advice about side effects. You may report side effects to FDA at 1-800-FDA-1088. Where should I keep my medicine? Keep out of the reach of children. Store at room temperature between 15 and 30 degrees C (59 and 86 degrees F). Throw away any unused medicine after the expiration date. NOTE: This sheet is a summary. It may not cover all possible information. If you have questions about this medicine, talk to your doctor, pharmacist, or health care provider.  2018 Elsevier/Gold Standard (2015-07-11 19:28:16)

## 2018-07-02 DIAGNOSIS — R918 Other nonspecific abnormal finding of lung field: Secondary | ICD-10-CM | POA: Insufficient documentation

## 2018-07-02 NOTE — Progress Notes (Signed)
Patient ID: [name] Park, male   DOB: 12-08-1950, 68 y.o.   MRN: [id number]   Medicare wellness and 3 month follow up   ----------------------------------------------------------------------------  Assessment and Plan:   Encounter for annual medicare wellness visit  Essential hypertension - continue medications, DASH diet, exercise and monitor at home. Call if greater than 130/80.  -     CBC with Differential/Platelet -     CMP/GFR -     TSH  Thoracic aorta atherosclerosis (HCC) Control blood pressure, cholesterol, glucose, increase exercise.   COPD (chronic obstructive pulmonary disease) with chronic bronchitis (HCC) Former smoker, dx per imaging, No triggers, well controlled symptoms, cont to monitor  Chronic kidney disease, stage 3 (HCC) Increase fluids, avoid NSAIDS, monitor sugars, will monitor -     CMP WITH GFR  Medication management -     Magnesium  Mixed hyperlipidemia -continue medications, check lipids, decrease fatty foods, increase activity. -     Lipid panel  Other abnormal glucose Discussed disease progression and risks Discussed diet/exercise, weight management and risk modification  Vitamin D deficiency Continue supplement  Generalized anxiety disorder Continue wellbutrin  Overweight (BMI 25.0-29.9) - long discussion about weight loss, diet, and exercise -recommended diet heavy in fruits and veggies and low in animal meats, cheeses, and dairy products  54 pack year smoking history/lung nodules 54 pack, no symptoms, quit 14 years ago, will get one more low dose screening Benign appearing lung nodules noted on last chest CT with annual follow up recommended, will order to schedule for 09/2018 after discussion -     CT CHEST LUNG CA SCREEN LOW DOSE W/O CM; Future   Future Appointments  Date Time Provider [place]  10/06/2018 11:00 AM Unk Pinto, MD [facility] None  04/14/2019  9:00 AM Unk Pinto, MD [facility] None     Continue diet and meds as discussed. Further disposition pending results of labs. During the course of the visit the patient was educated and counseled about appropriate screening and preventive services including:    Pneumococcal vaccine   Influenza vaccine  Td vaccine  Screening electrocardiogram  Screening mammography  Bone densitometry screening  Colorectal cancer screening  Diabetes screening  Glaucoma screening  Nutrition counseling   Advanced directives: given info/requested  HPI 68 y.o. male  presents for 3 month follow up with hypertension, hyperlipidemia, prediabetes and vitamin D and medicare wellness visit.    He is on vesicare for OAB.   He is on wellbutrin for anxiety/depression which helps.   BMI is Body mass index is 29.13 kg/m., he has not been working on diet and exercise. Wt Readings from Last 3 Encounters:  07/05/18 191 lb 9.6 oz (86.9 kg)  04/19/18 194 lb 3.2 oz (88.1 kg)  04/01/18 194 lb (88 kg)    His blood pressure has been controlled at home, he restarted 1/2 of the 5mg  and BP has been better, today their BP is BP: 120/78.    He does workout. He denies chest pain, shortness of breath, dizziness.  He use to smoke, 54 pack years, quit over 14 years ago, does have diagnosis of COPD via CXR in 2013 but denies any SOB and has not had any exacerbations.  Had low dose screening chest CT that showed multiple nodules felt to be benign appearing, recommended annual follow up.    He is on cholesterol medication, he is on 1 lipitor 3 days a week and denies myalgias. His cholesterol is at goal. The cholesterol last visit was:  Lab Results  Component Value Date   CHOL 134 04/01/2018   HDL 50 04/01/2018   LDLCALC 67 04/01/2018   TRIG 88 04/01/2018   CHOLHDL 2.7 04/01/2018    He has been working on diet and exercise for prediabetes, and denies foot ulcerations, hyperglycemia, hypoglycemia , increased appetite, nausea, paresthesia of the feet,  polydipsia, polyuria, visual disturbances, vomiting and weight loss. Last A1C in the office was:  Lab Results  Component Value Date   HGBA1C 5.8 (H) 04/01/2018   Patient is on Vitamin D supplement.  Lab Results  Component Value Date   VD25OH 82 04/01/2018     Lab Results  Component Value Date   GFRNONAA 53 (L) 04/01/2018      Current Medications:  Current Outpatient Medications on File Prior to Visit  Medication Sig Dispense Refill  . albuterol (VENTOLIN HFA) 108 (90 Base) MCG/ACT inhaler Inhale 2 puffs into the lungs every 4 (four) hours as needed for wheezing or shortness of breath. 1 Inhaler 0  . aspirin EC 81 MG tablet Take 81 mg by mouth daily. Take 1 tablet (81 mg) by mouth 3 times week - Sunday, Wednesday and Friday - with lunch    . atorvastatin (LIPITOR) 40 MG tablet TAKE 1 TABLET BY MOUTH  DAILY 90 tablet 1  . bisoprolol-hydrochlorothiazide (ZIAC) 5-6.25 MG tablet TAKE 1 TABLET EVERY MORNING FOR BLOOD PRESSURE 90 tablet 1  . buPROPion (WELLBUTRIN XL) 150 MG 24 hr tablet TAKE 1 TABLET BY MOUTH  DAILY 90 tablet 1  . Cholecalciferol (VITAMIN D-3) 1000 UNITS CAPS Take 3,000 Units by mouth daily.     . famotidine (PEPCID) 40 MG tablet Take 1 tablet at bedtime for Acid Reflux 90 tablet 3  . VESICARE 10 MG tablet TAKE 1 TABLET BY MOUTH  DAILY (Patient taking differently: TAKE 1/2 TABLET BY MOUTH  DAILY) 90 tablet 1   No current facility-administered medications on file prior to visit.     Medical History:  Past Medical History:  Diagnosis Date  . Acute appendicitis 11/24/2016  . COPD (chronic obstructive pulmonary disease) ([place])   . GERD (gastroesophageal reflux disease)   . Hematuria   . HLD (hyperlipidemia)   . Hypertension   . IBS (irritable bowel syndrome)   . Pre-diabetes   . Vitamin D deficiency    Preventative care:  Immunization History  Administered Date(s) Administered  . Influenza Whole 04/07/2013  . Influenza, High Dose Seasonal PF 04/13/2014,  03/27/2015, 03/25/2016, 04/08/2018  . PPD Test 12/05/2013  . Pneumococcal Conjugate-13 06/08/2014  . Pneumococcal Polysaccharide-23 09/07/2014  . Tdap 04/29/2011  . Zoster 06/12/2014    Last colonoscopy: 2017 Dr. Fuller Plan. 10 year follow up EGD: 01/2013 DEXA: N/A CXR 09/02/2013 CT chest 09/2017 - nodules - 1 year follow up Ct AB 11/2016  Prior vaccinations: TD or Tdap: 2012         Influenza: 2019 Pneumococcal: 2016 Prevnar13: 2015 Shingles/Zostavax: 2015  Names of Other Physician/Practitioners you currently use: 1. [facility]- here for primary care 2. Dr. Katy Apo, eye doctor, last visit 2019, yearly,  3. Dr. Marland Kitchen new dentist, last visit 06/2018, goes q69m  Patient Care Team: Unk Pinto, MD as PCP - General (Internal Medicine) Katy Apo, MD as Consulting Physician (Ophthalmology) Ladene Artist, MD as Consulting Physician (Gastroenterology) Eula Listen, DDS as Referring Physician (Dentistry) Druscilla Brownie, MD as Consulting Physician (Dermatology)  Allergies No Known Allergies  SURGICAL HISTORY He  has a past surgical history  that includes Cervical fusion; Trigger finger release (Right); Carpal tunnel release (Right); Hand surgery (Left); and laparoscopic appendectomy (N/A, 11/24/2016). FAMILY HISTORY His family history includes COPD in his mother; Cancer in his father; Diverticulosis in his mother; Hypertension in his mother; Irritable bowel syndrome in his mother; Kidney disease in his father; Lung cancer in his father; Lupus in his mother; Other in his father; Stroke in his father. SOCIAL HISTORY He  reports that he quit smoking about 14 years ago. His smoking use included cigarettes. He has a 52.50 pack-year smoking history. He has never used smokeless tobacco. He reports current alcohol use. He reports that he does not use drugs.  MEDICARE WELLNESS OBJECTIVES: Physical activity: Current Exercise Habits: The patient  does not participate in regular exercise at present(doing yardwork), Exercise limited by: None identified Cardiac risk factors: Cardiac Risk Factors include: advanced age (>64men, >58 women);dyslipidemia;hypertension;male gender;sedentary lifestyle;smoking/ tobacco exposure Depression/mood screen:   Depression screen [facility] 2/9 07/05/2018  Decreased Interest 0  Down, Depressed, Hopeless 0  PHQ - 2 Score 0    ADLs:  In your present state of health, do you have any difficulty performing the following activities: 07/05/2018 04/04/2018  Hearing? N N  Vision? N N  Difficulty concentrating or making decisions? N N  Walking or climbing stairs? N N  Dressing or bathing? N N  Doing errands, shopping? N N  Some recent data might be hidden     Cognitive Testing  Alert? Yes  Normal Appearance?Yes  Oriented to person? Yes  Place? Yes   Time? Yes  Recall of three objects?  Yes  Can perform simple calculations? Yes  Displays appropriate judgment?Yes  Can read the correct time from a watch face?Yes  EOL planning: Does Patient Have a Medical Advance Directive?: Yes Type of Advance Directive: Healthcare Power of Attorney, Living will Does patient want to make changes to medical advance directive?: No - Patient declined Copy of [place] in Chart?: No - copy requested    Review of Systems:  Review of Systems  Constitutional: Negative for chills, fever and malaise/fatigue.  HENT: Negative for congestion, ear pain and sore throat.   Respiratory: Negative for cough, shortness of breath and wheezing.   Cardiovascular: Negative for chest pain, palpitations and leg swelling.  Gastrointestinal: Negative for blood in stool, constipation, diarrhea, heartburn and melena.  Genitourinary: Negative.   Musculoskeletal: Negative for myalgias.  Skin: Negative.   Neurological: Negative for dizziness, sensory change, loss of consciousness and headaches.  Psychiatric/Behavioral: Negative for  depression. The patient is not nervous/anxious and does not have insomnia.     Physical Exam: BP 120/78   Pulse 62   Temp (!) 97.4 F (36.3 C)   Ht 5\' 8"  (1.727 m)   Wt 191 lb 9.6 oz (86.9 kg)   SpO2 99%   BMI 29.13 kg/m  Wt Readings from Last 3 Encounters:  07/05/18 191 lb 9.6 oz (86.9 kg)  04/19/18 194 lb 3.2 oz (88.1 kg)  04/01/18 194 lb (88 kg)    General Appearance: Well nourished well developed, in no apparent distress. Eyes: PERRLA, EOMs, conjunctiva no swelling or erythema ENT/Mouth: Ear canals normal with thick wax bilaterally easily removed by loop/curette, without swelling, erythma, discharge.  TMs normal bilaterally.  Oropharynx moist, clear, without exudate, or postoropharyngeal swelling. Neck: Supple, thyroid normal,no cervical adenopathy  Respiratory: Respiratory effort normal, Breath sounds clear A&P without rhonchi, wheeze, or rale.  No retractions, no accessory usage. Cardio: RRR with no MRGs.  Brisk peripheral pulses without edema.  Abdomen: Soft, + BS,  Non tender, no guarding, rebound, hernias, masses. Musculoskeletal: Full ROM, 5/5 strength, Normal gait Skin: Warm, dry without rashes, lesions, ecchymosis.  Neuro: Awake and oriented X 3, Cranial nerves intact. Normal muscle tone, no cerebellar symptoms. Psych: Normal affect, Insight and Judgment appropriate.    Medicare Attestation I have personally reviewed: The patient's medical and social history Their use of alcohol, tobacco or illicit drugs Their current medications and supplements The patient's functional ability including ADLs,fall risks, home safety risks, cognitive, and hearing and visual impairment Diet and physical activities Evidence for depression or mood disorders  The patient's weight, height, BMI, and visual acuity have been recorded in the chart.  I have made referrals, counseling, and provided education to the patient based on review of the above and I have provided the patient with a  written personalized care plan for preventive services.   Izora Ribas, NP 11:06 AM [facility]

## 2018-07-05 ENCOUNTER — Encounter: Payer: Self-pay | Admitting: Adult Health

## 2018-07-05 ENCOUNTER — Ambulatory Visit (INDEPENDENT_AMBULATORY_CARE_PROVIDER_SITE_OTHER): Payer: Medicare Other | Admitting: Adult Health

## 2018-07-05 VITALS — BP 120/78 | HR 62 | Temp 97.4°F | Ht 68.0 in | Wt 191.6 lb

## 2018-07-05 DIAGNOSIS — Z0000 Encounter for general adult medical examination without abnormal findings: Secondary | ICD-10-CM

## 2018-07-05 DIAGNOSIS — K5790 Diverticulosis of intestine, part unspecified, without perforation or abscess without bleeding: Secondary | ICD-10-CM | POA: Diagnosis not present

## 2018-07-05 DIAGNOSIS — I10 Essential (primary) hypertension: Secondary | ICD-10-CM

## 2018-07-05 DIAGNOSIS — E782 Mixed hyperlipidemia: Secondary | ICD-10-CM

## 2018-07-05 DIAGNOSIS — E663 Overweight: Secondary | ICD-10-CM

## 2018-07-05 DIAGNOSIS — K219 Gastro-esophageal reflux disease without esophagitis: Secondary | ICD-10-CM | POA: Diagnosis not present

## 2018-07-05 DIAGNOSIS — Z0001 Encounter for general adult medical examination with abnormal findings: Secondary | ICD-10-CM

## 2018-07-05 DIAGNOSIS — R6889 Other general symptoms and signs: Secondary | ICD-10-CM

## 2018-07-05 DIAGNOSIS — R7303 Prediabetes: Secondary | ICD-10-CM

## 2018-07-05 DIAGNOSIS — J449 Chronic obstructive pulmonary disease, unspecified: Secondary | ICD-10-CM

## 2018-07-05 DIAGNOSIS — N3281 Overactive bladder: Secondary | ICD-10-CM

## 2018-07-05 DIAGNOSIS — N183 Chronic kidney disease, stage 3 unspecified: Secondary | ICD-10-CM

## 2018-07-05 DIAGNOSIS — R918 Other nonspecific abnormal finding of lung field: Secondary | ICD-10-CM

## 2018-07-05 DIAGNOSIS — Z87891 Personal history of nicotine dependence: Secondary | ICD-10-CM

## 2018-07-05 DIAGNOSIS — F411 Generalized anxiety disorder: Secondary | ICD-10-CM

## 2018-07-05 DIAGNOSIS — E559 Vitamin D deficiency, unspecified: Secondary | ICD-10-CM

## 2018-07-05 DIAGNOSIS — I700 Atherosclerosis of aorta: Secondary | ICD-10-CM

## 2018-07-05 NOTE — Patient Instructions (Signed)
[name] , Thank you for taking time to come for your Medicare Wellness Visit. I appreciate your ongoing commitment to your health goals. Please review the following plan we discussed and let me know if I can assist you in the future.   These are the goals we discussed: Goals    . Exercise 150 min/wk Moderate Activity    . Weight (lb) < 185 lb (83.9 kg)       This is a list of the screening recommended for you and due dates:  Health Maintenance  Topic Date Due  . Tetanus Vaccine  04/28/2021  . Colon Cancer Screening  02/27/2026  . Flu Shot  Completed  .  Hepatitis C: One time screening is recommended by Center for Disease Control  (CDC) for  adults born from [birth year] through 1967.   Completed  . Pneumonia vaccines  Completed      Pulmonary Nodule A pulmonary nodule is a small, round growth of tissue in the lung. It is sometimes referred to as a "shadow" or "spot on the lung." Nodules range in size from less than 1/5 of an inch (4 mm) to a little bigger than an inch (30 mm). Pulmonary nodules can be either noncancerous (benign) or cancerous (malignant). Most are noncancerous. Smaller nodules in people who do not smoke and do not have any other risk factors for lung cancer are more likely to be noncancerous. Larger, irregular nodules in people who smoke or who have a strong family history of lung cancer are more likely to be cancerous. What are the causes? This condition may be caused by:  A bacterial, fungal, or viral infection, such as tuberculosis. The infection is usually an old and inactive one.  A noncancerous mass of tissue.  Inflammation from conditions such as rheumatoid arthritis.  Abnormal blood vessels in the lungs.  Cancerous tissue, such as lung cancer or a cancer in another part of the body that has spread to the lung. What are the signs or symptoms? This condition usually does not cause symptoms. If symptoms appear, they are usually related to the underlying cause.  For example, if the condition is caused by an infection, you may have a cough or fever. How is this diagnosed? This condition is usually diagnosed with an X-ray or CT scan. To help determine whether a pulmonary nodule is benign or malignant, your health care provider will:  Take your medical history.  Perform a physical exam.  Order tests, including: ? Blood tests. ? A skin test called a tuberculin test. This test is done to check if you have been exposed to the germ that causes tuberculosis. ? Chest X-rays. ? A CT scan. This test shows smaller pulmonary nodules more clearly and with more detail than an X-ray. ? A positron emission tomography (PET) scan. This test is done to check if the nodule is cancerous. During the test, a safe amount of a radioactive substance is injected into the bloodstream. Then a picture is taken. ? Biopsy. In this test, a tiny piece of the pulmonary nodule is removed and then examined under a microscope. How is this treated? Treatment for this condition depends on whether the pulmonary nodule is malignant or benign as well as your risk of getting cancer.  Noncancerous nodules usually do not need to be treated, but they may need to be monitored with CT scans. If a CT scan shows that the pulmonary nodule got bigger, more tests may be done.  Some nodules need  to be removed. If this is the case, you may have a procedure called a thoractomy. During the procedure, your health care provider will make an incision in your chest and remove the part of the lung where the nodule is located. Follow these instructions at home:   Take over-the-counter and prescription medicines only as told by your health care provider.  Do not use any products that contain nicotine or tobacco, such as cigarettes and e-cigarettes. If you need help quitting, ask your health care provider.  Keep all follow-up visits as told by your health care provider. This is important. Contact a health care  provider if:  You have trouble breathing when you are active.  You feel sick or unusually tired.  You do not feel like eating.  You lose weight without trying.  You develop chills or night sweats. Get help right away if:  You cannot catch your breath.  You begin wheezing.  You cannot stop coughing.  You cough up blood.  You become dizzy or feel like you are going to faint.  You have sudden chest pain.  You have a fever or persistent symptoms for more than 2-3 days.  You have a fever and your symptoms suddenly get worse. Summary  A pulmonary nodule is a small, round growth of tissue in the lung. Most pulmonary nodules are noncancerous.  This condition is usually diagnosed with an X-ray or CT scan.  Common causes of pulmonary nodules include infection, inflammation, and noncancerous growths.  Though less common, if a nodule is found to be cancerous, you will need specific diagnostic tests and treatment options as directed by your medical provider.  Treatment for this condition depends on whether the pulmonary nodule is benign or malignant as well as your risk of getting cancer. This information is not intended to replace advice given to you by your health care provider. Make sure you discuss any questions you have with your health care provider. Document Released: 04/06/2009 Document Revised: 07/08/2016 Document Reviewed: 07/08/2016 Elsevier Interactive Patient Education  2019 [employer].

## 2018-07-06 LAB — CBC WITH DIFFERENTIAL/PLATELET
Absolute Monocytes: 676 cells/uL (ref 200–950)
BASOS ABS: 52 {cells}/uL (ref 0–200)
Basophils Relative: 0.8 %
EOS ABS: 169 {cells}/uL (ref 15–500)
Eosinophils Relative: 2.6 %
HCT: 39.5 % (ref 38.5–50.0)
HEMOGLOBIN: 13.6 g/dL (ref 13.2–17.1)
Lymphs Abs: 1521 cells/uL (ref 850–3900)
MCH: 31.1 pg (ref 27.0–33.0)
MCHC: 34.4 g/dL (ref 32.0–36.0)
MCV: 90.2 fL (ref 80.0–100.0)
MPV: 11.3 fL (ref 7.5–12.5)
Monocytes Relative: 10.4 %
Neutro Abs: 4082 cells/uL (ref 1500–7800)
Neutrophils Relative %: 62.8 %
PLATELETS: 280 10*3/uL (ref 140–400)
RBC: 4.38 10*6/uL (ref 4.20–5.80)
RDW: 12.3 % (ref 11.0–15.0)
TOTAL LYMPHOCYTE: 23.4 %
WBC: 6.5 10*3/uL (ref 3.8–10.8)

## 2018-07-06 LAB — COMPLETE METABOLIC PANEL WITH GFR
AG Ratio: 1.6 (calc) (ref 1.0–2.5)
ALBUMIN MSPROF: 4.3 g/dL (ref 3.6–5.1)
ALKALINE PHOSPHATASE (APISO): 107 U/L (ref 40–115)
ALT: 29 U/L (ref 9–46)
AST: 27 U/L (ref 10–35)
BUN / CREAT RATIO: 12 (calc) (ref 6–22)
BUN: 19 mg/dL (ref 7–25)
CALCIUM: 10.4 mg/dL — AB (ref 8.6–10.3)
CO2: 30 mmol/L (ref 20–32)
CREATININE: 1.54 mg/dL — AB (ref 0.70–1.25)
Chloride: 101 mmol/L (ref 98–110)
GFR, EST NON AFRICAN AMERICAN: 45 mL/min/{1.73_m2} — AB (ref >=60)
GFR, Est African American: 53 mL/min/{1.73_m2} — ABNORMAL LOW (ref >=60)
GLOBULIN: 2.7 g/dL (ref 1.9–3.7)
Glucose, Bld: 90 mg/dL (ref 65–99)
Potassium: 5 mmol/L (ref 3.5–5.3)
SODIUM: 142 mmol/L (ref 135–146)
Total Bilirubin: 0.6 mg/dL (ref 0.2–1.2)
Total Protein: 7 g/dL (ref 6.1–8.1)

## 2018-07-06 LAB — LIPID PANEL
Cholesterol: 149 mg/dL (ref <200)
HDL: 54 mg/dL (ref >40)
LDL Cholesterol (Calc): 76 mg/dL (calc)
NON-HDL CHOLESTEROL (CALC): 95 mg/dL (ref <130)
Total CHOL/HDL Ratio: 2.8 (calc) (ref <5.0)
Triglycerides: 107 mg/dL (ref <150)

## 2018-07-06 LAB — HEMOGLOBIN A1C
Hgb A1c MFr Bld: 5.8 % of total Hgb — ABNORMAL HIGH (ref <5.7)
Mean Plasma Glucose: 120 (calc)
eAG (mmol/L): 6.6 (calc)

## 2018-07-06 LAB — TSH: TSH: 0.97 mIU/L (ref 0.40–4.50)

## 2018-08-02 DIAGNOSIS — Z961 Presence of intraocular lens: Secondary | ICD-10-CM | POA: Diagnosis not present

## 2018-08-02 DIAGNOSIS — H26493 Other secondary cataract, bilateral: Secondary | ICD-10-CM | POA: Diagnosis not present

## 2018-08-02 DIAGNOSIS — H31002 Unspecified chorioretinal scars, left eye: Secondary | ICD-10-CM | POA: Diagnosis not present

## 2018-10-06 ENCOUNTER — Ambulatory Visit (INDEPENDENT_AMBULATORY_CARE_PROVIDER_SITE_OTHER): Payer: Medicare Other | Admitting: Internal Medicine

## 2018-10-06 ENCOUNTER — Other Ambulatory Visit: Payer: Self-pay

## 2018-10-06 ENCOUNTER — Encounter: Payer: Self-pay | Admitting: Internal Medicine

## 2018-10-06 VITALS — BP 132/76 | HR 76 | Temp 97.9°F | Resp 16 | Ht 68.0 in | Wt 190.8 lb

## 2018-10-06 DIAGNOSIS — I10 Essential (primary) hypertension: Secondary | ICD-10-CM

## 2018-10-06 DIAGNOSIS — R7303 Prediabetes: Secondary | ICD-10-CM | POA: Diagnosis not present

## 2018-10-06 DIAGNOSIS — E559 Vitamin D deficiency, unspecified: Secondary | ICD-10-CM | POA: Diagnosis not present

## 2018-10-06 DIAGNOSIS — K219 Gastro-esophageal reflux disease without esophagitis: Secondary | ICD-10-CM

## 2018-10-06 DIAGNOSIS — E782 Mixed hyperlipidemia: Secondary | ICD-10-CM

## 2018-10-06 DIAGNOSIS — Z79899 Other long term (current) drug therapy: Secondary | ICD-10-CM

## 2018-10-06 DIAGNOSIS — R7309 Other abnormal glucose: Secondary | ICD-10-CM

## 2018-10-06 NOTE — Patient Instructions (Signed)

## 2018-10-06 NOTE — Progress Notes (Signed)
History of Present Illness:      This very nice 68 y.o. MWM presents for 6 month follow up with HTN, HLD, Pre-Diabetes and Vitamin D Deficiency. Patient has GERD controlled on his meds.      Patient is treated for HTN & BP has been controlled at home. Today's BP is at goal -  132/76. Patient has had no complaints of any cardiac type chest pain, palpitations, dyspnea / orthopnea / PND, dizziness, claudication, or dependent edema.      Hyperlipidemia is controlled with diet & Atorvastatin. Patient denies myalgias or other med SE's. Last Lipids were at goal: Lab Results  Component Value Date   CHOL 149 07/05/2018   HDL 54 07/05/2018   LDLCALC 76 07/05/2018   TRIG 107 07/05/2018   CHOLHDL 2.8 07/05/2018        Also, the patient has history of PreDiabetes (A1c 6.3% / 2011)  and has had no symptoms of reactive hypoglycemia, diabetic polys, paresthesias or visual blurring.  Last A1c was not at goal: Lab Results  Component Value Date   HGBA1C 5.8 (H) 07/05/2018    Further, the patient also has history of Vitamin D Deficiency ("31" / 2013)  and supplements vitamin D without any suspected side-effects. Last vitamin D was at goal: Lab Results  Component Value Date   VD25OH 82 04/01/2018   Current Outpatient Medications on File Prior to Visit  Medication Sig  . albuterol (VENTOLIN HFA) 108 (90 Base) MCG/ACT inhaler Inhale 2 puffs into the lungs every 4 (four) hours as needed for wheezing or shortness of breath.  Marland Kitchen aspirin EC 81 MG tablet Take 81 mg by mouth daily. Take 1 tablet (81 mg) by mouth 3 times week - Sunday, Wednesday and Friday - with lunch  . atorvastatin (LIPITOR) 40 MG tablet TAKE 1 TABLET BY MOUTH  DAILY  . bisoprolol-hydrochlorothiazide (ZIAC) 5-6.25 MG tablet TAKE 1 TABLET EVERY MORNING FOR BLOOD PRESSURE  . buPROPion (WELLBUTRIN XL) 150 MG 24 hr tablet TAKE 1 TABLET BY MOUTH  DAILY  . Cholecalciferol (VITAMIN D-3) 1000 UNITS CAPS Take 5,000 Units by mouth daily.   .  famotidine (PEPCID) 40 MG tablet Take 1 tablet at bedtime for Acid Reflux  . VESICARE 10 MG tablet TAKE 1 TABLET BY MOUTH  DAILY (Patient taking differently: TAKE 1/2 TABLET BY MOUTH  DAILY)   No current facility-administered medications on file prior to visit.    No Known Allergies   PMHx:   Past Medical History:  Diagnosis Date  . Acute appendicitis 11/24/2016  . COPD (chronic obstructive pulmonary disease) ([place])   . GERD (gastroesophageal reflux disease)   . Hematuria   . HLD (hyperlipidemia)   . Hypertension   . IBS (irritable bowel syndrome)   . Pre-diabetes   . Vitamin D deficiency    Immunization History  Administered Date(s) Administered  . Influenza Whole 04/07/2013  . Influenza, High Dose Seasonal PF 04/13/2014, 03/27/2015, 03/25/2016, 04/08/2018  . PPD Test 12/05/2013  . Pneumococcal Conjugate-13 06/08/2014  . Pneumococcal Polysaccharide-23 09/07/2014  . Tdap 04/29/2011  . Zoster 06/12/2014   Past Surgical History:  Procedure Laterality Date  . CARPAL TUNNEL RELEASE Right   . CERVICAL FUSION    . HAND SURGERY Left   . LAPAROSCOPIC APPENDECTOMY N/A 11/24/2016   Procedure: APPENDECTOMY LAPAROSCOPIC;  Surgeon: Clovis Riley, MD;  Location: [facility];  Service: General;  Laterality: N/A;  . TRIGGER FINGER RELEASE Right    thumb   FHx:  Reviewed / unchanged  SHx:    Reviewed / unchanged   Systems Review:  Constitutional: Denies fever, chills, wt changes, headaches, insomnia, fatigue, night sweats, change in appetite. Eyes: Denies redness, blurred vision, diplopia, discharge, itchy, watery eyes.  ENT: Denies discharge, congestion, post nasal drip, epistaxis, sore throat, earache, hearing loss, dental pain, tinnitus, vertigo, sinus pain, snoring.  CV: Denies chest pain, palpitations, irregular heartbeat, syncope, dyspnea, diaphoresis, orthopnea, PND, claudication or edema. Respiratory: denies cough, dyspnea, DOE, pleurisy, hoarseness, laryngitis, wheezing.   Gastrointestinal: Denies dysphagia, odynophagia, heartburn, reflux, water brash, abdominal pain or cramps, nausea, vomiting, bloating, diarrhea, constipation, hematemesis, melena, hematochezia  or hemorrhoids. Genitourinary: Denies dysuria, frequency, urgency, nocturia, hesitancy, discharge, hematuria or flank pain. Musculoskeletal: Denies arthralgias, myalgias, stiffness, jt. swelling, pain, limping or strain/sprain.  Skin: Denies pruritus, rash, hives, warts, acne, eczema or change in skin lesion(s). Neuro: No weakness, tremor, incoordination, spasms, paresthesia or pain. Psychiatric: Denies confusion, memory loss or sensory loss. Endo: Denies change in weight, skin or hair change.  Heme/Lymph: No excessive bleeding, bruising or enlarged lymph nodes.  Physical Exam  BP 132/76   Pulse 76   Temp 97.9 F (36.6 C)   Resp 16   Ht 5\' 8"  (1.727 m)   Wt 190 lb 12.8 oz (86.5 kg)   BMI 29.01 kg/m   Appears  well nourished, well groomed  and in no distress.  Eyes: PERRLA, EOMs, conjunctiva no swelling or erythema. Sinuses: No frontal/maxillary tenderness ENT/Mouth: EAC's clear, TM's nl w/o erythema, bulging. Nares clear w/o erythema, swelling, exudates. Oropharynx clear without erythema or exudates. Oral hygiene is good. Tongue normal, non obstructing. Hearing intact.  Neck: Supple. Thyroid not palpable. Car 2+/2+ without bruits, nodes or JVD. Chest: Respirations nl with BS clear & equal w/o rales, rhonchi, wheezing or stridor.  Cor: Heart sounds normal w/ regular rate and rhythm without sig. murmurs, gallops, clicks or rubs. Peripheral pulses normal and equal  without edema.  Abdomen: Soft & bowel sounds normal. Non-tender w/o guarding, rebound, hernias, masses or organomegaly.  Lymphatics: Unremarkable.  Musculoskeletal: Full ROM all peripheral extremities, joint stability, 5/5 strength and normal gait.  Skin: Warm, dry without exposed rashes, lesions or ecchymosis apparent.  Neuro:  Cranial nerves intact, reflexes equal bilaterally. Sensory-motor testing grossly intact. Tendon reflexes grossly intact.  Pysch: Alert & oriented x 3.  Insight and judgement nl & appropriate. No ideations.  Assessment and Plan:  1. Essential hypertension  - Continue medication, monitor blood pressure at home.  - Continue DASH diet.  Reminder to go to the ER if any CP,  SOB, nausea, dizziness, severe HA, changes vision/speech.  - CBC with Differential/Platelet - COMPLETE METABOLIC PANEL WITH GFR - Magnesium - TSH  2. Hyperlipidemia, mixed  - Continue diet/meds, exercise,& lifestyle modifications.  - Continue monitor periodic cholesterol/liver & renal functions   - Lipid panel - TSH  3. Abnormal glucose  - Continue diet, exercise  - Lifestyle modifications.  - Monitor appropriate labs.  - Hemoglobin A1c - Insulin, random  4. Vitamin D deficiency  - Continue supplementation.  - VITAMIN D 25 Hydroxyl  5. Prediabetes  - Hemoglobin A1c - Insulin, random  6. Gastroesophageal reflux disease  - CBC with Differential/Platelet  7. Medication management  - CBC with Differential/Platelet - COMPLETE METABOLIC PANEL WITH GFR - Magnesium - Lipid panel - TSH - Hemoglobin A1c - Insulin, random - VITAMIN D 25 Hydroxyl       Discussed  regular exercise, BP monitoring, weight control to  achieve/maintain BMI less than 25 and discussed med and SE's. Recommended labs to assess and monitor clinical status with further disposition pending results of labs. I discussed the assessment and treatment plan with the patient. The patient was provided an opportunity to ask questions and all were answered. The patient agreed with the plan and demonstrated an understanding of the instructions.Over 30 minutes of exam, counseling, chart review was performed.       Kirtland Bouchard, MD

## 2018-10-07 LAB — CBC WITH DIFFERENTIAL/PLATELET
Absolute Monocytes: 551 cells/uL (ref 200–950)
Basophils Absolute: 38 cells/uL (ref 0–200)
Basophils Relative: 0.7 %
Eosinophils Absolute: 189 cells/uL (ref 15–500)
Eosinophils Relative: 3.5 %
HCT: 39.1 % (ref 38.5–50.0)
Hemoglobin: 13.3 g/dL (ref 13.2–17.1)
Lymphs Abs: 1145 cells/uL (ref 850–3900)
MCH: 30.6 pg (ref 27.0–33.0)
MCHC: 34 g/dL (ref 32.0–36.0)
MCV: 90.1 fL (ref 80.0–100.0)
MPV: 11.2 fL (ref 7.5–12.5)
Monocytes Relative: 10.2 %
Neutro Abs: 3478 cells/uL (ref 1500–7800)
Neutrophils Relative %: 64.4 %
Platelets: 268 10*3/uL (ref 140–400)
RBC: 4.34 10*6/uL (ref 4.20–5.80)
RDW: 12.3 % (ref 11.0–15.0)
Total Lymphocyte: 21.2 %
WBC: 5.4 10*3/uL (ref 3.8–10.8)

## 2018-10-07 LAB — COMPLETE METABOLIC PANEL WITH GFR
AG Ratio: 1.8 (calc) (ref 1.0–2.5)
ALT: 23 U/L (ref 9–46)
AST: 24 U/L (ref 10–35)
Albumin: 4.2 g/dL (ref 3.6–5.1)
Alkaline phosphatase (APISO): 88 U/L (ref 35–144)
BUN/Creatinine Ratio: 11 (calc) (ref 6–22)
BUN: 18 mg/dL (ref 7–25)
CO2: 29 mmol/L (ref 20–32)
Calcium: 9.9 mg/dL (ref 8.6–10.3)
Chloride: 102 mmol/L (ref 98–110)
Creat: 1.62 mg/dL — ABNORMAL HIGH (ref 0.70–1.25)
GFR, Est African American: 49 mL/min/{1.73_m2} — ABNORMAL LOW (ref >=60)
GFR, Est Non African American: 43 mL/min/{1.73_m2} — ABNORMAL LOW (ref >=60)
Globulin: 2.4 g/dL (calc) (ref 1.9–3.7)
Glucose, Bld: 106 mg/dL — ABNORMAL HIGH (ref 65–99)
Potassium: 4.6 mmol/L (ref 3.5–5.3)
Sodium: 139 mmol/L (ref 135–146)
Total Bilirubin: 0.5 mg/dL (ref 0.2–1.2)
Total Protein: 6.6 g/dL (ref 6.1–8.1)

## 2018-10-07 LAB — HEMOGLOBIN A1C
Hgb A1c MFr Bld: 5.7 % of total Hgb — ABNORMAL HIGH (ref <5.7)
Mean Plasma Glucose: 117 (calc)
eAG (mmol/L): 6.5 (calc)

## 2018-10-07 LAB — LIPID PANEL
Cholesterol: 131 mg/dL (ref <200)
HDL: 49 mg/dL (ref >=40)
LDL Cholesterol (Calc): 63 mg/dL (calc)
Non-HDL Cholesterol (Calc): 82 mg/dL (calc) (ref <130)
Total CHOL/HDL Ratio: 2.7 (calc) (ref <5.0)
Triglycerides: 108 mg/dL (ref <150)

## 2018-10-07 LAB — INSULIN, RANDOM: Insulin: 29.5 u[IU]/mL — ABNORMAL HIGH

## 2018-10-07 LAB — MAGNESIUM: Magnesium: 2 mg/dL (ref 1.5–2.5)

## 2018-10-07 LAB — TSH: TSH: 0.81 mIU/L (ref 0.40–4.50)

## 2018-10-07 LAB — VITAMIN D 25 HYDROXY (VIT D DEFICIENCY, FRACTURES): Vit D, 25-Hydroxy: 74 ng/mL (ref 30–100)

## 2018-10-09 ENCOUNTER — Other Ambulatory Visit: Payer: Self-pay | Admitting: Internal Medicine

## 2018-10-09 DIAGNOSIS — N3281 Overactive bladder: Secondary | ICD-10-CM

## 2018-10-09 DIAGNOSIS — I10 Essential (primary) hypertension: Secondary | ICD-10-CM

## 2018-10-12 ENCOUNTER — Inpatient Hospital Stay: Admission: RE | Admit: 2018-10-12 | Payer: Medicare Other | Source: Ambulatory Visit

## 2018-11-23 ENCOUNTER — Other Ambulatory Visit: Payer: Self-pay

## 2018-11-23 ENCOUNTER — Ambulatory Visit
Admission: RE | Admit: 2018-11-23 | Discharge: 2018-11-23 | Disposition: A | Discharge status: Home / Self Care | Payer: Medicare Other | Source: Ambulatory Visit | Attending: Adult Health | Admitting: Adult Health

## 2018-11-23 DIAGNOSIS — Z87891 Personal history of nicotine dependence: Secondary | ICD-10-CM | POA: Diagnosis not present

## 2018-11-23 DIAGNOSIS — R918 Other nonspecific abnormal finding of lung field: Secondary | ICD-10-CM

## 2018-12-07 ENCOUNTER — Encounter: Payer: Self-pay | Admitting: Orthopaedic Surgery

## 2018-12-07 ENCOUNTER — Ambulatory Visit: Payer: Self-pay

## 2018-12-07 ENCOUNTER — Ambulatory Visit (INDEPENDENT_AMBULATORY_CARE_PROVIDER_SITE_OTHER): Payer: Medicare Other | Admitting: Orthopaedic Surgery

## 2018-12-07 ENCOUNTER — Other Ambulatory Visit: Payer: Self-pay

## 2018-12-07 DIAGNOSIS — M545 Low back pain, unspecified: Secondary | ICD-10-CM

## 2018-12-07 MED ORDER — TIZANIDINE HCL 4 MG PO TABS: 4.0000 mg | ORAL_TABLET | Freq: Two times a day (BID) | ORAL | 0 refills | Status: DC | PRN

## 2018-12-07 MED ORDER — PREDNISONE 10 MG (21) PO TBPK: ORAL_TABLET | ORAL | 0 refills | Status: DC

## 2018-12-07 NOTE — Progress Notes (Signed)
Office Visit Note   Patient: [name] Park           Date of Birth: 30-Apr-1951           MRN: [id number] Visit Date: 12/07/2018              Requested by: Unk Pinto, [address],  [place] [address] PCP: Unk Pinto, MD   Assessment & Plan: Visit Diagnoses:  1. Acute left-sided low back pain without sciatica     Plan: Impression is left-sided lumbar strain.  We will start the patient on a steroid taper and muscle relaxer.  Have also provided him with a physical therapy prescription once his symptoms improve.  He will follow-up with [place] as needed.  Call with concerns or questions in the meantime.  Follow-Up Instructions: Return if symptoms worsen or fail to improve.   Orders:  Orders Placed This Encounter  Procedures   XR Lumbar Spine 2-3 Views   Meds ordered this encounter  Medications   predniSONE (STERAPRED UNI-PAK 21 TAB) 10 MG (21) TBPK tablet    Sig: Take as directed    Dispense:  21 tablet    Refill:  0   tiZANidine (ZANAFLEX) 4 MG tablet    Sig: Take 1 tablet (4 mg total) by mouth 2 (two) times daily as needed for muscle spasms.    Dispense:  20 tablet    Refill:  0      Procedures: No procedures performed   Clinical Data: No additional findings.   Subjective: Chief Complaint  Patient presents with   Lower Back - Pain    HPI patient is a pleasant 68 year old gentleman who presents our clinic today with left lower back pain.  This initially began approximately 1 week ago without any known injury or change in activity.  He started taking Mobic which seem to be relieving his symptoms until yesterday.  He was putting on a sock when he twisted and felt a pop to his left lower back.  Since then he has had a catch type feeling to the left lower back.  He is had associated muscle spasms as well.  He is unsure exactly what elicits his pain, but notes that stepping or walking certain ways seems to aggravated at times.  He  has tried using ice without significant relief of symptoms.  He denies any weakness to either lower extremity.  He denies any numbness, tingling or burning to either lower extremity.  No bowel or bladder change and no saddle paresthesias.  He does have a history of cervical spine fusion.  No history of epidural steroid injection or surgery to the lumbar spine.  Review of Systems as detailed in HPI.  All others reviewed and are negative.   Objective: Vital Signs: There were no vitals taken for this visit.  Physical Exam well-developed well-nourished gentleman in no acute distress.  Alert and oriented x3.  Ortho Exam examination of the lumbar spine reveals no spinous tenderness.  He does have slight tenderness to the paraspinous musculature left lower lumbar spine.  Positive straight leg raise.  No focal weakness.  He is neurovascularly intact distally.  Specialty Comments:  No specialty comments available.  Imaging: Xr Lumbar Spine 2-3 Views  Result Date: 12/07/2018 X-rays demonstrate diffuse degenerative disc disease with anterior spurring.  Otherwise, no acute findings    PMFS History: Patient Active Problem List   Diagnosis Date Noted   Medication management 10/06/2018   Abnormal  glucose 10/06/2018   Pulmonary nodules 07/02/2018   Prediabetes 04/01/2018   Chronic kidney disease, stage 3 ([place]) 06/11/2017   Overweight (BMI 25.0-29.9) 06/10/2017   Former smoker (21 pack year) 06/10/2017   Thoracic aorta atherosclerosis ([place]) 08/11/2016   COPD (chronic obstructive pulmonary disease) with chronic bronchitis ([place]) 09/07/2014   Generalized anxiety disorder 09/07/2014   Hyperlipidemia, mixed 06/08/2014   Gastroesophageal reflux disease 03/08/2014   OAB (overactive bladder) 03/08/2014   Diverticulosis 03/08/2014   Essential hypertension 12/05/2013   Vitamin D deficiency 06/21/2013   Past Medical History:  Diagnosis Date   Acute appendicitis 11/24/2016   COPD  (chronic obstructive pulmonary disease) ([facility])    GERD (gastroesophageal reflux disease)    Hematuria    HLD (hyperlipidemia)    Hypertension    IBS (irritable bowel syndrome)    Pre-diabetes    Vitamin D deficiency     Family History  Problem Relation Age of Onset   Irritable bowel syndrome Mother    Diverticulosis Mother    Lupus Mother    COPD Mother    Hypertension Mother    Lung cancer Father    Other Father        bladder polyps   Stroke Father    Kidney disease Father    Cancer Father    Colon cancer Neg Hx    Esophageal cancer Neg Hx    Rectal cancer Neg Hx    Stomach cancer Neg Hx     Past Surgical History:  Procedure Laterality Date   CARPAL TUNNEL RELEASE Right    CERVICAL FUSION     HAND SURGERY Left    LAPAROSCOPIC APPENDECTOMY N/A 11/24/2016   Procedure: APPENDECTOMY LAPAROSCOPIC;  Surgeon: Clovis Riley, MD;  Location: [facility];  Service: General;  Laterality: N/A;   TRIGGER FINGER RELEASE Right    thumb   Social History   Occupational History   Occupation: retired  Tobacco Use   Smoking status: Former Smoker    Packs/day: 1.50    Years: 35.00    Pack years: 52.50    Types: Cigarettes    Quit date: 02/05/2004    Years since quitting: 14.8   Smokeless tobacco: Never Used  Substance and Sexual Activity   Alcohol use: Yes    Comment: seldom   Drug use: No   Sexual activity: Not on file

## 2018-12-27 ENCOUNTER — Other Ambulatory Visit: Payer: Self-pay | Admitting: Internal Medicine

## 2019-01-05 NOTE — Progress Notes (Signed)
FOLLOW UP  Assessment and Plan:   Memory changes Declines medications, normal neuro, ? Vascular, check labs Follow up if any changes in symptoms Continue exercise, this is good for your overall health and well-being Stay well hydrated , Healthy diet with fresh fruits and vegetables Incorporate strategies that may help compensate for deficits such as Maintain a regular schedule Avoid multitasking  Use a daily organizer to keep track of important appointments and information   Thoracic aorta atherosclerosis ([place]) Control blood pressure, cholesterol, glucose, increase exercise.   COPD (chronic obstructive pulmonary disease) with chronic bronchitis ([facility]) 53 pack year hx; quit smoking; continue to encourage avoidance of triggers; asymptomatic; monitoring; encourage regular exercise   Former smoker (54 pack year) Continue annual low dose screening CT per recommendations  Hypertension Well controlled with current medications  Monitor blood pressure at home; patient to call if consistently greater than 130/80 Continue DASH diet.   Reminder to go to the ER if any CP, SOB, nausea, dizziness, severe HA, changes vision/speech, left arm numbness and tingling and jaw pain.  Cholesterol Continue medication  Continue low cholesterol diet and exercise.  Check lipid panel.   Other abnormal glucose A1C well controlled on last several checks; defer today  Continue diet and exercise.  Perform daily foot/skin check, notify office of any concerning changes.  BMP  Overweight Long discussion about weight loss, diet, and exercise Recommended diet heavy in fruits and veggies and low in animal meats, cheeses, and dairy products, appropriate calorie intake Discussed appropriate weight for height Follow up in 3 months  Vitamin D Def/ osteoporosis prevention Continue supplementation Check Vit D level  CKD stage 3 Increase fluids, avoid NSAIDS, monitor sugars, will monitor  Continue diet and  meds as discussed. Further disposition pending results of labs. Discussed med's effects and SE's.   Over 30 minutes of exam, counseling, chart review, and critical decision making was performed.   Future Appointments  Date Time Provider [place]  04/14/2019  9:00 AM Unk Pinto, MD [facility] None    ----------------------------------------------------------------------------------------------------------------------  HPI 68 y.o. male  presents for 3 month follow up on hypertension, cholesterol, history of abnormal glucose, COPD, weight and vitamin D deficiency.   States he has some short term memory loss, issues with remembering names, and his wife states he is not remember things she is telling him, has been going on x years, states started after taking statin for a while. He is not having an issues doing his medications, he is not having an issues with driving at this time.  States he has more stress, slight depression due to virus. He is on wellbutrin.   No tremor, changes in memory, headaches, no numbness or tingling anywhere. He has never had MRI.   He is a former smoking with a 53 pack year smoking history, getting low dose CT scans, last one was 11/2018  BMI is Body mass index is 28.52 kg/m., he has not been working on diet and exercise. Wt Readings from Last 3 Encounters:  01/06/19 187 lb 9.6 oz (85.1 kg)  11/23/18 190 lb 11.2 oz (86.5 kg)  10/06/18 190 lb 12.8 oz (86.5 kg)   His blood pressure has been controlled at home, today their BP is BP: 135/72  He does not workout -due to back, has stopped also due to heat.  He denies chest pain, shortness of breath, dizziness.   He is on cholesterol medication and denies myalgias. His cholesterol is at goal. The cholesterol last visit was:  Lab Results  Component Value Date   CHOL 131 10/06/2018   HDL 49 10/06/2018   LDLCALC 63 10/06/2018   TRIG 108 10/06/2018   CHOLHDL 2.7 10/06/2018    He has not been working  on diet and exercise for history of abnormal glucose, and denies increased appetite, nausea, paresthesia of the feet, polydipsia, polyuria, visual disturbances and vomiting. Last A1C in the office was:  Lab Results  Component Value Date   HGBA1C 5.7 (H) 10/06/2018   Patient is on Vitamin D supplement though remains below goal:   Lab Results  Component Value Date   VD25OH 74 10/06/2018        Current Medications:  Current Outpatient Medications on File Prior to Visit  Medication Sig  . albuterol (VENTOLIN HFA) 108 (90 Base) MCG/ACT inhaler Inhale 2 puffs into the lungs every 4 (four) hours as needed for wheezing or shortness of breath.  Marland Kitchen aspirin EC 81 MG tablet Take 81 mg by mouth daily. Take 1 tablet (81 mg) by mouth 3 times week - Sunday, Wednesday and Friday - with lunch  . atorvastatin (LIPITOR) 40 MG tablet Take 1 tablet Daily for Cholesterol  . bisoprolol-hydrochlorothiazide (ZIAC) 5-6.25 MG tablet Take 1 Tablet Daily for BP  . buPROPion (WELLBUTRIN XL) 150 MG 24 hr tablet Take 1 tablet Daily for Mood  . Cholecalciferol (VITAMIN D-3) 1000 UNITS CAPS Take 5,000 Units by mouth daily.   . famotidine (PEPCID) 40 MG tablet Take 1 tablet at bedtime for Acid Reflux  . solifenacin (VESICARE) 10 MG tablet Take 1 tablet Daily for Bladder   No current facility-administered medications on file prior to visit.      Allergies: No Known Allergies   Medical History:  Past Medical History:  Diagnosis Date  . Acute appendicitis 11/24/2016  . COPD (chronic obstructive pulmonary disease) ([place])   . GERD (gastroesophageal reflux disease)   . Hematuria   . HLD (hyperlipidemia)   . Hypertension   . IBS (irritable bowel syndrome)   . Pre-diabetes   . Vitamin D deficiency    Family history- Reviewed and unchanged Social history- Reviewed and unchanged   Review of Systems:  Review of Systems  Constitutional: Negative for malaise/fatigue and weight loss.  HENT: Negative for hearing loss  and tinnitus.   Eyes: Negative for blurred vision and double vision.  Respiratory: Negative for cough, shortness of breath and wheezing (He reports rare wheezing without dyspnea - not triggered by exercise).   Cardiovascular: Negative for chest pain, palpitations, orthopnea, claudication and leg swelling.  Gastrointestinal: Negative for abdominal pain, blood in stool, constipation, diarrhea, heartburn, melena, nausea and vomiting.  Genitourinary: Negative.   Musculoskeletal: Negative for joint pain and myalgias.  Skin: Negative for rash.  Neurological: Negative for dizziness, tingling, sensory change, weakness and headaches.  Endo/Heme/Allergies: Negative for polydipsia.  Psychiatric/Behavioral: Positive for memory loss. Negative for depression and hallucinations. The patient is not nervous/anxious and does not have insomnia.   All other systems reviewed and are negative.   Physical Exam: BP 135/72   Pulse 76   Temp (!) 97.4 F (36.3 C)   Ht 5\' 8"  (1.727 m)   Wt 187 lb 9.6 oz (85.1 kg)   SpO2 97%   BMI 28.52 kg/m  Wt Readings from Last 3 Encounters:  01/06/19 187 lb 9.6 oz (85.1 kg)  11/23/18 190 lb 11.2 oz (86.5 kg)  10/06/18 190 lb 12.8 oz (86.5 kg)   General Appearance: Well nourished, in no apparent distress.  Eyes: PERRLA, EOMs, conjunctiva no swelling or erythema Sinuses: No Frontal/maxillary tenderness ENT/Mouth: Ext aud canals clear, TMs without erythema, bulging. No erythema, swelling, or exudate on post pharynx.  Tonsils not swollen or erythematous. Hearing normal.  Neck: Supple, thyroid normal.  Respiratory: Respiratory effort normal, BS equal bilaterally without rales, rhonchi, wheezing or stridor.  Cardio: RRR with no MRGs. Brisk peripheral pulses without edema.  Abdomen: Soft, + BS.  Non tender, no guarding, rebound, hernias, masses. Lymphatics: Non tender without lymphadenopathy.  Musculoskeletal: Full ROM, 5/5 strength, Normal gait Skin: Warm, dry without rashes,  lesions, ecchymosis.  Neuro: Cranial nerves intact. No cerebellar symptoms.  Psych: Awake and oriented X 3, normal affect, Insight and Judgment appropriate.    Vicie Mutters, PA-C 11:17 AM [facility]

## 2019-01-06 ENCOUNTER — Ambulatory Visit (INDEPENDENT_AMBULATORY_CARE_PROVIDER_SITE_OTHER): Payer: Medicare Other | Admitting: Physician Assistant

## 2019-01-06 ENCOUNTER — Other Ambulatory Visit: Payer: Self-pay

## 2019-01-06 VITALS — BP 135/72 | HR 76 | Temp 97.4°F | Ht 68.0 in | Wt 187.6 lb

## 2019-01-06 DIAGNOSIS — E538 Deficiency of other specified B group vitamins: Secondary | ICD-10-CM | POA: Diagnosis not present

## 2019-01-06 DIAGNOSIS — N183 Chronic kidney disease, stage 3 unspecified: Secondary | ICD-10-CM

## 2019-01-06 DIAGNOSIS — E559 Vitamin D deficiency, unspecified: Secondary | ICD-10-CM

## 2019-01-06 DIAGNOSIS — D509 Iron deficiency anemia, unspecified: Secondary | ICD-10-CM

## 2019-01-06 DIAGNOSIS — R413 Other amnesia: Secondary | ICD-10-CM

## 2019-01-06 DIAGNOSIS — J449 Chronic obstructive pulmonary disease, unspecified: Secondary | ICD-10-CM | POA: Diagnosis not present

## 2019-01-06 DIAGNOSIS — I700 Atherosclerosis of aorta: Secondary | ICD-10-CM | POA: Diagnosis not present

## 2019-01-06 DIAGNOSIS — Z79899 Other long term (current) drug therapy: Secondary | ICD-10-CM

## 2019-01-06 DIAGNOSIS — R7309 Other abnormal glucose: Secondary | ICD-10-CM

## 2019-01-06 DIAGNOSIS — E782 Mixed hyperlipidemia: Secondary | ICD-10-CM | POA: Diagnosis not present

## 2019-01-06 DIAGNOSIS — I10 Essential (primary) hypertension: Secondary | ICD-10-CM

## 2019-01-06 DIAGNOSIS — J4489 Other specified chronic obstructive pulmonary disease: Secondary | ICD-10-CM

## 2019-01-06 NOTE — Patient Instructions (Signed)
We will start donepezil (Aricept) 5mg  daily for four weeks.  If you are tolerating the medication, then after four weeks, we will increase the dose to 10mg  daily.  Side effects include nausea, vomiting, diarrhea, vivid dreams, and muscle cramps.  Please call the clinic if you experience any of these symptoms.  He was informed to call 911 if he develop any new symptoms such as worsening headaches, episodes of blurred vision, double vision or complete loss of vision or speech difficulties or motor weakness.   Vascular Dementia Dementia is a condition in which a person has problems with thinking, memory, and behavior that are severe enough to interfere with daily life. Vascular dementia is a type of dementia. It results from brain damage that is caused by the brain not getting enough blood. This condition may also be called vascular cognitive impairment. What are the causes? Vascular dementia is caused by conditions that lessen blood flow to the brain. Common causes of this condition include:  Multiple small strokes. These may happen without symptoms (silent stroke).  Major stroke.  Damage to small blood vessels in the brain (cerebral small vessel disease). What increases the risk? The following factors may make you more likely to develop this condition:  Having had a stroke.  Having high blood pressure (hypertension) or high cholesterol.  Having a disease that affects the heart or blood vessels.  Smoking.  Having diabetes.  Having metabolic syndrome.  Being obese.  Not being active.  Having depression.  Being over age 6. What are the signs or symptoms? Symptoms can vary from one person to another. Symptoms may be mild or severe depending on the amount of damage and which parts of the brain have been affected. Symptoms may begin suddenly or may develop slowly. Mental symptoms of vascular dementia may include:  Confusion.  Memory problems.  Poor attention and concentration.   Trouble understanding speech.  Depression.  Personality changes.  Trouble recognizing familiar people.  Agitation or aggression.  Paranoia.  Delusions or hallucinations. Physical symptoms of vascular dementia may include:  Weakness.  Poor balance.  Loss of bladder or bowel control (incontinence).  Unsteady walking (gait).  Speaking problems. Behavioral symptoms of vascular dementia may include:  Getting lost in familiar places.  Problems with planning and judgment.  Trouble following instructions.  Social problems.  Emotional outbursts.  Trouble with daily activities and self-care.  Problems handling money. Symptoms may remain stable, or they may get worse over time. Symptoms of vascular dementia may be similar to those of Alzheimer's disease. The two conditions can occur together (mixed dementia). How is this diagnosed? Your health care provider will consider your medical history and symptoms or changes that are reported by friends and family. Your health care provider will do a physical exam and may order lab tests or other tests that check brain and nervous system function. Tests that may be done include:  Blood tests.  Brain imaging tests.  Tests of movement, speech, and other daily activities (neurological exam).  Tests of memory, thinking, and problem-solving (neuropsychological or neurocognitive testing). There is not a specific test to diagnose vascular dementia. Diagnosis may involve several specialists. These may include:  A health care provider who specializes in the brain and nervous system (neurologist).  A health provider who specializes in understanding how problems in the brain can alter behavior and cognitive function (neuropsychologist). How is this treated? There is no cure for vascular dementia. Brain damage that has already occurred cannot be reversed. Treatment  depends on:  How severe the condition is.  Which parts of your brain  have been affected.  Your overall health. Treatment measures aim to:  Treat the underlying cause of vascular dementia and manage risk factors. This may include: ? Controlling blood pressure. ? Lowering cholesterol. ? Treating diabetes. ? Quitting smoking. ? Losing weight or maintaining a healthy weight. ? Eating a healthy, balanced diet. ? Getting regular exercise.  Manage symptoms.  Prevent further brain damage.  Improve the person's health and quality of life. Treatment for dementia may involve a team of health care providers, including:  A neurologist.  A provider who specializes in disorders of the mind (psychiatrist).  A provider who specializes in helping people learn daily living skills (occupational therapist).  A provider who focuses on speech and language changes (Electrical engineer).  A heart specialist (cardiologist).  A provider who helps people learn how to manage physical changes, such as movement and walking (exercise physiologist or physical therapist). Follow these instructions at home: Lifestyle  People with vascular dementia may need regular help at home or daily care from a family member or home health care worker. Home care for a person with vascular dementia depends on what caused the condition and how severe the symptoms are. General guidelines for caregivers include:  Help the person with dementia remember people, appointments, and daily activities.  Help the person with dementia manage his or her medicines.  Help family and friends learn about ways to communicate with the person with dementia.  Create a safe living space to reduce the risk of injury or falls.  Find a support group to help caregivers and family cope with the effects of dementia.  General instructions  Help the person take over-the-counter and prescription medicines only as told by the health care provider.  Follow the health care provider's instructions for treating the  condition that caused the dementia.  Make sure the person keeps all follow-up visits as told by the health care provider. This is important. Contact a health care provider if:  A fever develops.  New behavioral problems develop.  Problems with swallowing develop.  Confusion gets worse.  Sleepiness gets worse. Get help right away if:  Loss of consciousness occurs.  There is a sudden loss of speech, balance, or thinking ability.  New numbness or paralysis occurs.  Sudden, severe headache occurs.  Vision is lost or suddenly gets worse in one or both eyes. Summary  Vascular dementia is a type of dementia. It results from brain damage that is caused by the brain not getting enough blood.  Vascular dementia is caused by conditions that lessen blood flow to the brain. Common causes of this condition include stroke and damage to small blood vessels in the brain.  Treatment focuses on treating the underlying cause of vascular dementia and managing any risk factors.  People with vascular dementia may need regular help at home or daily care from a family member or home health care worker.  Contact a health care provider if you or your caregiver notice any new symptoms. This information is not intended to replace advice given to you by your health care provider. Make sure you discuss any questions you have with your health care provider. Document Released: 05/30/2002 Document Revised: 03/10/2018 Document Reviewed: 03/11/2018 Elsevier Patient Education  [place].  Memory Compensation Strategies  1. Use "WARM" strategy.  W= write it down  A= associate it  R= repeat it  M= make a mental note  2.   You can keep a Social worker.  Use a 3-ring notebook with sections for the following: calendar, important names and phone numbers,  medications, doctors' names/phone numbers, lists/reminders, and a section to journal what you did  each day.   3.    Use a calendar to write  appointments down.  4.    Write yourself a schedule for the day.  This can be placed on the calendar or in a separate section of the Memory Notebook.  Keeping a  regular schedule can help memory.  5.    Use medication organizer with sections for each day or morning/evening pills.  You may need help loading it  6.    Keep a basket, or pegboard by the door.  Place items that you need to take out with you in the basket or on the pegboard.  You may also want to  include a message board for reminders.  7.    Use sticky notes.  Place sticky notes with reminders in a place where the task is performed.  For example: " turn off the  stove" placed by the stove, "lock the door" placed on the door at eye level, " take your medications" on  the bathroom mirror or by the place where you normally take your medications.  8.    Use alarms/timers.  Use while cooking to remind yourself to check on food or as a reminder to take your medicine, or as a  reminder to make a call, or as a reminder to perform another task, etc.

## 2019-01-07 LAB — LIPID PANEL
Cholesterol: 142 mg/dL (ref <200)
HDL: 49 mg/dL (ref >=40)
LDL Cholesterol (Calc): 73 mg/dL (calc)
Non-HDL Cholesterol (Calc): 93 mg/dL (calc) (ref <130)
Total CHOL/HDL Ratio: 2.9 (calc) (ref <5.0)
Triglycerides: 116 mg/dL (ref <150)

## 2019-01-07 LAB — CBC WITH DIFFERENTIAL/PLATELET
Absolute Monocytes: 605 cells/uL (ref 200–950)
Basophils Absolute: 50 cells/uL (ref 0–200)
Basophils Relative: 0.9 %
Eosinophils Absolute: 263 cells/uL (ref 15–500)
Eosinophils Relative: 4.7 %
HCT: 39.3 % (ref 38.5–50.0)
Hemoglobin: 13.3 g/dL (ref 13.2–17.1)
Lymphs Abs: 1064 cells/uL (ref 850–3900)
MCH: 30.8 pg (ref 27.0–33.0)
MCHC: 33.8 g/dL (ref 32.0–36.0)
MCV: 91 fL (ref 80.0–100.0)
MPV: 10.7 fL (ref 7.5–12.5)
Monocytes Relative: 10.8 %
Neutro Abs: 3618 cells/uL (ref 1500–7800)
Neutrophils Relative %: 64.6 %
Platelets: 321 10*3/uL (ref 140–400)
RBC: 4.32 10*6/uL (ref 4.20–5.80)
RDW: 12.5 % (ref 11.0–15.0)
Total Lymphocyte: 19 %
WBC: 5.6 10*3/uL (ref 3.8–10.8)

## 2019-01-07 LAB — SEDIMENTATION RATE: Sed Rate: 29 mm/h — ABNORMAL HIGH (ref 0–20)

## 2019-01-07 LAB — RPR: RPR Ser Ql: NONREACTIVE

## 2019-01-07 LAB — COMPLETE METABOLIC PANEL WITH GFR
AG Ratio: 1.7 (calc) (ref 1.0–2.5)
ALT: 25 U/L (ref 9–46)
AST: 23 U/L (ref 10–35)
Albumin: 4.1 g/dL (ref 3.6–5.1)
Alkaline phosphatase (APISO): 91 U/L (ref 35–144)
BUN/Creatinine Ratio: 13 (calc) (ref 6–22)
BUN: 19 mg/dL (ref 7–25)
CO2: 28 mmol/L (ref 20–32)
Calcium: 9.9 mg/dL (ref 8.6–10.3)
Chloride: 101 mmol/L (ref 98–110)
Creat: 1.51 mg/dL — ABNORMAL HIGH (ref 0.70–1.25)
GFR, Est African American: 54 mL/min/{1.73_m2} — ABNORMAL LOW (ref >=60)
GFR, Est Non African American: 46 mL/min/{1.73_m2} — ABNORMAL LOW (ref >=60)
Globulin: 2.4 g/dL (calc) (ref 1.9–3.7)
Glucose, Bld: 106 mg/dL — ABNORMAL HIGH (ref 65–99)
Potassium: 4.7 mmol/L (ref 3.5–5.3)
Sodium: 139 mmol/L (ref 135–146)
Total Bilirubin: 0.5 mg/dL (ref 0.2–1.2)
Total Protein: 6.5 g/dL (ref 6.1–8.1)

## 2019-01-07 LAB — TSH: TSH: 0.53 mIU/L (ref 0.40–4.50)

## 2019-01-07 LAB — FERRITIN: Ferritin: 95 ng/mL (ref 24–380)

## 2019-01-07 LAB — VITAMIN D 25 HYDROXY (VIT D DEFICIENCY, FRACTURES): Vit D, 25-Hydroxy: 71 ng/mL (ref 30–100)

## 2019-01-07 LAB — IRON, TOTAL/TOTAL IRON BINDING CAP
%SAT: 28 % (calc) (ref 20–48)
Iron: 84 ug/dL (ref 50–180)
TIBC: 295 mcg/dL (calc) (ref 250–425)

## 2019-01-07 LAB — MAGNESIUM: Magnesium: 1.9 mg/dL (ref 1.5–2.5)

## 2019-01-07 LAB — CK: Total CK: 113 U/L (ref 44–196)

## 2019-01-07 LAB — HEMOGLOBIN A1C
Hgb A1c MFr Bld: 5.8 % of total Hgb — ABNORMAL HIGH (ref <5.7)
Mean Plasma Glucose: 120 (calc)
eAG (mmol/L): 6.6 (calc)

## 2019-01-07 LAB — VITAMIN B12: Vitamin B-12: 471 pg/mL (ref 200–1100)

## 2019-02-21 ENCOUNTER — Other Ambulatory Visit: Payer: Self-pay | Admitting: *Deleted

## 2019-02-21 DIAGNOSIS — N3281 Overactive bladder: Secondary | ICD-10-CM

## 2019-02-21 MED ORDER — BUPROPION HCL ER (XL) 150 MG PO TB24: ORAL_TABLET | ORAL | 3 refills | Status: DC

## 2019-02-21 MED ORDER — SOLIFENACIN SUCCINATE 10 MG PO TABS: ORAL_TABLET | ORAL | 3 refills | Status: DC

## 2019-04-11 ENCOUNTER — Other Ambulatory Visit: Payer: Self-pay | Admitting: Internal Medicine

## 2019-04-11 DIAGNOSIS — K219 Gastro-esophageal reflux disease without esophagitis: Secondary | ICD-10-CM

## 2019-04-13 ENCOUNTER — Encounter: Payer: Self-pay | Admitting: Internal Medicine

## 2019-04-13 NOTE — Progress Notes (Signed)
Annual  Screening/Preventative Visit  & Comprehensive Evaluation & Examination     This very nice 68 y.o. MWM presents for a Screening /Preventative Visit & comprehensive evaluation and management of multiple medical co-morbidities.  Patient has been followed for HTN, HLD, Prediabetes and Vitamin D Deficiency.     HTN predates circa 2012. Patient's BP has been controlled at home.  Today's BP is at goal - 126/68. Patient denies any cardiac symptoms as chest pain, palpitations, shortness of breath, dizziness or ankle swelling.     Patient's hyperlipidemia is controlled with diet and medications. Patient denies myalgias or other medication SE's. Last lipids were at goal:  Lab Results  Component Value Date   CHOL 142 01/06/2019   HDL 49 01/06/2019   LDLCALC 73 01/06/2019   TRIG 116 01/06/2019   CHOLHDL 2.9 01/06/2019      Patient has hx/o prediabetes (A1c 5.8% & 6.3% / 2011 and 5.9% / 2016)  and patient denies reactive hypoglycemic symptoms, visual blurring, diabetic polys or paresthesias. Last A1c was near goal:  Lab Results  Component Value Date   HGBA1C 5.8 (H) 01/06/2019       Finally, patient has history of Vitamin D Deficiency ("31" / 2013)  and last vitamin D was atgoal:  Lab Results  Component Value Date   VD25OH 71 01/06/2019   Current Outpatient Medications on File Prior to Visit  Medication Sig  . albuterol (VENTOLIN HFA) 108 (90 Base) MCG/ACT inhaler Inhale 2 puffs into the lungs every 4 (four) hours as needed for wheezing or shortness of breath.  Marland Kitchen aspirin EC 81 MG tablet Take 81 mg by mouth daily. Take 1 tablet (81 mg) by mouth 3 times week - Sunday, Wednesday and Friday - with lunch  . atorvastatin (LIPITOR) 40 MG tablet Take 1 tablet Daily for Cholesterol  . bisoprolol-hydrochlorothiazide (ZIAC) 5-6.25 MG tablet Take 1 Tablet Daily for BP  . buPROPion (WELLBUTRIN XL) 150 MG 24 hr tablet Take 1 tablet Daily for Mood  . Cholecalciferol (VITAMIN D-3) 1000 UNITS CAPS  Take 5,000 Units by mouth daily.   . famotidine (PEPCID) 40 MG tablet Take 1 tablet at Bedtime for   Acid Indigestion & Reflux  . solifenacin (VESICARE) 10 MG tablet Take 1 tablet Daily for Bladder   No current facility-administered medications on file prior to visit.    No Known Allergies   Past Medical History:  Diagnosis Date  . Acute appendicitis 11/24/2016  . COPD (chronic obstructive pulmonary disease) ([place])   . GERD (gastroesophageal reflux disease)   . Hematuria   . HLD (hyperlipidemia)   . Hypertension   . IBS (irritable bowel syndrome)   . Pre-diabetes   . Vitamin D deficiency    Health Maintenance  Topic Date Due  . INFLUENZA VACCINE  01/22/2019  . TETANUS/TDAP  04/28/2021  . COLONOSCOPY  02/27/2026  . Hepatitis C Screening  Completed  . PNA vac Low Risk Adult  Completed   Immunization History  Administered Date(s) Administered  . Influenza Whole 04/07/2013  . Influenza, High Dose Seasonal PF 04/13/2014, 03/27/2015, 03/25/2016, 04/08/2018, 04/14/2019  . PPD Test 12/05/2013  . Pneumococcal Conjugate-13 06/08/2014  . Pneumococcal Polysaccharide-23 09/07/2014  . Tdap 04/29/2011  . Zoster 06/12/2014   Last Colon - 02/28/2016 - Dr Fuller Plan  -recc 10 yr f/u - due Sept 2027  Past Surgical History:  Procedure Laterality Date  . CARPAL TUNNEL RELEASE Right   . CERVICAL FUSION    . HAND SURGERY Left   .  LAPAROSCOPIC APPENDECTOMY N/A 11/24/2016   Procedure: APPENDECTOMY LAPAROSCOPIC;  Surgeon: Clovis Riley, MD;  Location: [facility] OR;  Service: General;  Laterality: N/A;  . TRIGGER FINGER RELEASE Right    thumb   Family History  Problem Relation Age of Onset  . Irritable bowel syndrome Mother   . Diverticulosis Mother   . Lupus Mother   . COPD Mother   . Hypertension Mother   . Lung cancer Father   . Other Father        bladder polyps  . Stroke Father   . Kidney disease Father   . Cancer Father   . Colon cancer Neg Hx   . Esophageal cancer Neg Hx   . Rectal  cancer Neg Hx   . Stomach cancer Neg Hx      ROS Constitutional: Denies fever, chills, weight loss/gain, headaches, insomnia,  night sweats or change in appetite. Does c/o fatigue. Eyes: Denies redness, blurred vision, diplopia, discharge, itchy or watery eyes.  ENT: Denies discharge, congestion, post nasal drip, epistaxis, sore throat, earache, hearing loss, dental pain, Tinnitus, Vertigo, Sinus pain or snoring.  Cardio: Denies chest pain, palpitations, irregular heartbeat, syncope, dyspnea, diaphoresis, orthopnea, PND, claudication or edema Respiratory: denies cough, dyspnea, DOE, pleurisy, hoarseness, laryngitis or wheezing.  Gastrointestinal: Denies dysphagia, heartburn, reflux, water brash, pain, cramps, nausea, vomiting, bloating, diarrhea, constipation, hematemesis, melena, hematochezia, jaundice or hemorrhoids Genitourinary: Denies dysuria, frequency, urgency, nocturia, hesitancy, discharge, hematuria or flank pain Musculoskeletal: Denies arthralgia, myalgia, stiffness, Jt. Swelling, pain, limp or strain/sprain. Denies Falls. Skin: Denies puritis, rash, hives, warts, acne, eczema or change in skin lesion Neuro: No weakness, tremor, incoordination, spasms, paresthesia or pain Psychiatric: Denies confusion, memory loss or sensory loss. Denies Depression. Endocrine: Denies change in weight, skin, hair change, nocturia, and paresthesia, diabetic polys, visual blurring or hyper / hypo glycemic episodes.  Heme/Lymph: No excessive bleeding, bruising or enlarged lymph nodes.  Physical Exam  BP 126/68   Pulse 64   Temp (!) 97.1 F (36.2 C)   Resp 16   Ht 5' 8.5" (1.74 m)   Wt 188 lb (85.3 kg)   BMI 28.17 kg/m   General Appearance: Well nourished and well groomed and in no apparent distress.  Eyes: PERRLA, EOMs, conjunctiva no swelling or erythema, normal fundi and vessels. Sinuses: No frontal/maxillary tenderness ENT/Mouth: EACs patent / TMs  nl. Nares clear without erythema,  swelling, mucoid exudates. Oral hygiene is good. No erythema, swelling, or exudate. Tongue normal, non-obstructing. Tonsils not swollen or erythematous. Hearing normal.  Neck: Supple, thyroid not palpable. No bruits, nodes or JVD. Respiratory: Respiratory effort normal.  BS equal and clear bilateral without rales, rhonci, wheezing or stridor. Cardio: Heart sounds are normal with regular rate and rhythm and no murmurs, rubs or gallops. Peripheral pulses are normal and equal bilaterally without edema. No aortic or femoral bruits. Chest: symmetric with normal excursions and percussion.  Abdomen: Soft, with Nl bowel sounds. Nontender, no guarding, rebound, hernias, masses, or organomegaly.  Lymphatics: Non tender without lymphadenopathy.  Musculoskeletal: Full ROM all peripheral extremities, joint stability, 5/5 strength, and normal gait. Skin: Warm and dry without rashes, lesions, cyanosis, clubbing or  ecchymosis.  Neuro: Cranial nerves intact, reflexes equal bilaterally. Normal muscle tone, no cerebellar symptoms. Sensation intact.  Pysch: Alert and oriented X 3 with normal affect, insight and judgment appropriate.   Assessment and Plan  1. Annual Preventative/Screening Exam   2. Essential hypertension  - Urinalysis, Routine w reflex microscopic - Microalbumin / creatinine  urine ratio - CBC with Differential/Platelet - COMPLETE METABOLIC PANEL WITH GFR - Magnesium - TSH - EKG 12-Lead - [place], RETROPERITNL ABD,  LTD  3. Hyperlipidemia, mixed  - Lipid panel - TSH - EKG 12-Lead - [place], RETROPERITNL ABD,  LTD  4. Abnormal glucose  - Hemoglobin A1c - Insulin, random - EKG 12-Lead  5. 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 Prediabetes  - Hemoglobin A1c - Insulin, random - EKG 12-Lead - [place], RETROPERITNL ABD,  LTD  8. BPH with obstruction/lower urinary tract  symptoms  - PSA  9. B12 deficiency  - Vitamin B12  10. Iron deficiency anemia, hx  - Iron,Total/Total Iron Binding Cap  11. Prostate cancer screening  - PSA  12. Screening for colorectal cancer  - POC Hemoccult Bld/Stl   13. Screening for ischemic heart disease  - EKG 12-Lead  14. Former smoker (54 pack year)  - EKG 12-Lead - [place], RETROPERITNL ABD,  LTD  15. FH: hypertension  - EKG 12-Lead - [place], RETROPERITNL ABD,  LTD  16. Aortic atherosclerosis (HCC)  - [place], RETROPERITNL ABD,  LTD  17. Screening for AAA (aortic abdominal aneurysm)  - [place], RETROPERITNL ABD,  LTD  18. Medication management  - Urinalysis, Routine w reflex microscopic - Microalbumin / creatinine urine ratio - CBC with Differential/Platelet - COMPLETE METABOLIC PANEL WITH GFR - Magnesium - Lipid panel - TSH - Hemoglobin A1c - Insulin, random - VITAMIN D 25 Hydroxy  19. Need for immunization against influenza  - Flu vaccine HIGH DOSE PF (Fluzone High dose)  20. Thoracic aorta atherosclerosis ([facility]          Patient was counseled in prudent diet, weight control to achieve/maintain BMI less than 25, BP monitoring, regular exercise and medications as discussed.  Discussed med effects and SE's. Routine screening labs and tests as requested with regular follow-up as recommended. Over 40 minutes of exam, counseling, chart review and high complex critical decision making was performed   Kirtland Bouchard, MD

## 2019-04-13 NOTE — Patient Instructions (Signed)

## 2019-04-14 ENCOUNTER — Other Ambulatory Visit: Payer: Self-pay

## 2019-04-14 ENCOUNTER — Ambulatory Visit (INDEPENDENT_AMBULATORY_CARE_PROVIDER_SITE_OTHER): Payer: Medicare Other | Admitting: Internal Medicine

## 2019-04-14 ENCOUNTER — Encounter: Payer: Self-pay | Admitting: Internal Medicine

## 2019-04-14 VITALS — BP 126/68 | HR 64 | Temp 97.1°F | Resp 16 | Ht 68.5 in | Wt 188.0 lb

## 2019-04-14 DIAGNOSIS — Z8249 Family history of ischemic heart disease and other diseases of the circulatory system: Secondary | ICD-10-CM

## 2019-04-14 DIAGNOSIS — E559 Vitamin D deficiency, unspecified: Secondary | ICD-10-CM | POA: Diagnosis not present

## 2019-04-14 DIAGNOSIS — Z79899 Other long term (current) drug therapy: Secondary | ICD-10-CM

## 2019-04-14 DIAGNOSIS — Z1211 Encounter for screening for malignant neoplasm of colon: Secondary | ICD-10-CM

## 2019-04-14 DIAGNOSIS — Z87891 Personal history of nicotine dependence: Secondary | ICD-10-CM | POA: Diagnosis not present

## 2019-04-14 DIAGNOSIS — E538 Deficiency of other specified B group vitamins: Secondary | ICD-10-CM

## 2019-04-14 DIAGNOSIS — I700 Atherosclerosis of aorta: Secondary | ICD-10-CM

## 2019-04-14 DIAGNOSIS — N138 Other obstructive and reflux uropathy: Secondary | ICD-10-CM

## 2019-04-14 DIAGNOSIS — D509 Iron deficiency anemia, unspecified: Secondary | ICD-10-CM | POA: Diagnosis not present

## 2019-04-14 DIAGNOSIS — I10 Essential (primary) hypertension: Secondary | ICD-10-CM | POA: Diagnosis not present

## 2019-04-14 DIAGNOSIS — E782 Mixed hyperlipidemia: Secondary | ICD-10-CM

## 2019-04-14 DIAGNOSIS — Z136 Encounter for screening for cardiovascular disorders: Secondary | ICD-10-CM

## 2019-04-14 DIAGNOSIS — Z23 Encounter for immunization: Secondary | ICD-10-CM | POA: Diagnosis not present

## 2019-04-14 DIAGNOSIS — Z0001 Encounter for general adult medical examination with abnormal findings: Secondary | ICD-10-CM

## 2019-04-14 DIAGNOSIS — R7309 Other abnormal glucose: Secondary | ICD-10-CM

## 2019-04-14 DIAGNOSIS — J449 Chronic obstructive pulmonary disease, unspecified: Secondary | ICD-10-CM

## 2019-04-14 DIAGNOSIS — Z0000 Encounter for general adult medical examination without abnormal findings: Secondary | ICD-10-CM | POA: Diagnosis not present

## 2019-04-14 DIAGNOSIS — N401 Enlarged prostate with lower urinary tract symptoms: Secondary | ICD-10-CM

## 2019-04-14 DIAGNOSIS — R7303 Prediabetes: Secondary | ICD-10-CM

## 2019-04-14 DIAGNOSIS — Z125 Encounter for screening for malignant neoplasm of prostate: Secondary | ICD-10-CM

## 2019-04-15 LAB — URINALYSIS, ROUTINE W REFLEX MICROSCOPIC
Bacteria, UA: NONE SEEN /HPF
Bilirubin Urine: NEGATIVE
Glucose, UA: NEGATIVE
Hgb urine dipstick: NEGATIVE
Hyaline Cast: NONE SEEN /LPF
Ketones, ur: NEGATIVE
Nitrite: NEGATIVE
Protein, ur: NEGATIVE
RBC / HPF: NONE SEEN /HPF (ref 0–2)
Specific Gravity, Urine: 1.01 (ref 1.001–1.03)
Squamous Epithelial / HPF: NONE SEEN /HPF (ref <=5)
pH: 7 (ref 5.0–8.0)

## 2019-04-15 LAB — COMPLETE METABOLIC PANEL WITH GFR
AG Ratio: 1.6 (calc) (ref 1.0–2.5)
ALT: 25 U/L (ref 9–46)
AST: 25 U/L (ref 10–35)
Albumin: 4.1 g/dL (ref 3.6–5.1)
Alkaline phosphatase (APISO): 93 U/L (ref 35–144)
BUN/Creatinine Ratio: 12 (calc) (ref 6–22)
BUN: 20 mg/dL (ref 7–25)
CO2: 29 mmol/L (ref 20–32)
Calcium: 9.6 mg/dL (ref 8.6–10.3)
Chloride: 99 mmol/L (ref 98–110)
Creat: 1.67 mg/dL — ABNORMAL HIGH (ref 0.70–1.18)
GFR, Est African American: 47 mL/min/{1.73_m2} — ABNORMAL LOW (ref >=60)
GFR, Est Non African American: 41 mL/min/{1.73_m2} — ABNORMAL LOW (ref >=60)
Globulin: 2.5 g/dL (calc) (ref 1.9–3.7)
Glucose, Bld: 94 mg/dL (ref 65–99)
Potassium: 4.7 mmol/L (ref 3.5–5.3)
Sodium: 136 mmol/L (ref 135–146)
Total Bilirubin: 0.4 mg/dL (ref 0.2–1.2)
Total Protein: 6.6 g/dL (ref 6.1–8.1)

## 2019-04-15 LAB — CBC WITH DIFFERENTIAL/PLATELET
Absolute Monocytes: 593 cells/uL (ref 200–950)
Basophils Absolute: 40 cells/uL (ref 0–200)
Basophils Relative: 0.7 %
Eosinophils Absolute: 171 cells/uL (ref 15–500)
Eosinophils Relative: 3 %
HCT: 38.4 % — ABNORMAL LOW (ref 38.5–50.0)
Hemoglobin: 13 g/dL — ABNORMAL LOW (ref 13.2–17.1)
Lymphs Abs: 1151 cells/uL (ref 850–3900)
MCH: 30.7 pg (ref 27.0–33.0)
MCHC: 33.9 g/dL (ref 32.0–36.0)
MCV: 90.6 fL (ref 80.0–100.0)
MPV: 11 fL (ref 7.5–12.5)
Monocytes Relative: 10.4 %
Neutro Abs: 3745 cells/uL (ref 1500–7800)
Neutrophils Relative %: 65.7 %
Platelets: 266 10*3/uL (ref 140–400)
RBC: 4.24 10*6/uL (ref 4.20–5.80)
RDW: 12.4 % (ref 11.0–15.0)
Total Lymphocyte: 20.2 %
WBC: 5.7 10*3/uL (ref 3.8–10.8)

## 2019-04-15 LAB — TSH: TSH: 0.76 mIU/L (ref 0.40–4.50)

## 2019-04-15 LAB — LIPID PANEL
Cholesterol: 152 mg/dL (ref <200)
HDL: 51 mg/dL (ref >=40)
LDL Cholesterol (Calc): 83 mg/dL (calc)
Non-HDL Cholesterol (Calc): 101 mg/dL (calc) (ref <130)
Total CHOL/HDL Ratio: 3 (calc) (ref <5.0)
Triglycerides: 86 mg/dL (ref <150)

## 2019-04-15 LAB — IRON, TOTAL/TOTAL IRON BINDING CAP
%SAT: 27 % (calc) (ref 20–48)
Iron: 80 ug/dL (ref 50–180)
TIBC: 297 mcg/dL (calc) (ref 250–425)

## 2019-04-15 LAB — HEMOGLOBIN A1C
Hgb A1c MFr Bld: 5.5 % of total Hgb (ref <5.7)
Mean Plasma Glucose: 111 (calc)
eAG (mmol/L): 6.2 (calc)

## 2019-04-15 LAB — VITAMIN D 25 HYDROXY (VIT D DEFICIENCY, FRACTURES): Vit D, 25-Hydroxy: 54 ng/mL (ref 30–100)

## 2019-04-15 LAB — PSA: PSA: 0.7 ng/mL (ref <=4.0)

## 2019-04-15 LAB — VITAMIN B12: Vitamin B-12: 461 pg/mL (ref 200–1100)

## 2019-04-15 LAB — MICROALBUMIN / CREATININE URINE RATIO
Creatinine, Urine: 76 mg/dL (ref 20–320)
Microalb Creat Ratio: 9 mcg/mg creat (ref <30)
Microalb, Ur: 0.7 mg/dL

## 2019-04-15 LAB — MAGNESIUM: Magnesium: 1.9 mg/dL (ref 1.5–2.5)

## 2019-04-15 LAB — INSULIN, RANDOM: Insulin: 7.9 u[IU]/mL

## 2019-06-20 ENCOUNTER — Other Ambulatory Visit: Payer: Self-pay | Admitting: *Deleted

## 2019-06-20 DIAGNOSIS — K219 Gastro-esophageal reflux disease without esophagitis: Secondary | ICD-10-CM

## 2019-06-20 MED ORDER — FAMOTIDINE 40 MG PO TABS: ORAL_TABLET | ORAL | 3 refills | Status: DC

## 2019-07-15 ENCOUNTER — Ambulatory Visit: Payer: Medicare Other | Attending: Internal Medicine

## 2019-07-15 DIAGNOSIS — Z23 Encounter for immunization: Secondary | ICD-10-CM | POA: Insufficient documentation

## 2019-07-15 NOTE — Progress Notes (Signed)
   Covid-19 Vaccination Clinic  Name:  [name] Park    MRN: [id number] DOB: May 08, 1951  07/15/2019  [name] was observed post Covid-19 immunization for 15 minutes without incidence. He was provided with Vaccine Information Sheet and instruction to access the V-Safe system.   [name] was instructed to call 911 with any severe reactions post vaccine: Marland Kitchen Difficulty breathing  . Swelling of your face and throat  . A fast heartbeat  . A bad rash all over your body  . Dizziness and weakness    Immunizations Administered    Name Date Dose VIS Date Route   Pfizer COVID-19 Vaccine 07/15/2019 11:47 AM 0.3 mL 06/03/2019 Intramuscular   Manufacturer: [place]   Lot: [id number]   [place]: [id number]

## 2019-07-20 NOTE — Progress Notes (Signed)
Patient ID: [name] Park, male   DOB: 12-05-1950, 69 y.o.   MRN: [id number]   Medicare wellness and 3 month follow up   ----------------------------------------------------------------------------  Assessment and Plan:   Encounter for annual medicare wellness visit  Essential hypertension - continue medications, DASH diet, exercise and monitor at home. Call if greater than 130/80.  -     CBC with Differential/Platelet -     CMP/GFR -     TSH  Thoracic aorta atherosclerosis ([place]) Per numerous CXR/CTs Control blood pressure, cholesterol, glucose, increase exercise.   COPD (chronic obstructive pulmonary disease) with chronic bronchitis ([place]) Former smoker, dx per imaging, dyspnea with exertion, given trelegy sample to try with coupon, instructions and demonstration given today. He will message back with progress.   Chronic kidney disease, stage 3 (HCC) Increase fluids, avoid NSAIDS, monitor sugars, will monitor -     CMP WITH GFR  Medication management -     Magnesium  Mixed hyperlipidemia -continue medications, check lipids, decrease fatty foods, increase activity. -     Lipid panel  Other abnormal glucose Discussed disease progression and risks Discussed diet/exercise, weight management and risk modification  Vitamin D deficiency Continue supplement  Generalized anxiety disorder Continue wellbutrin  Overweight (BMI 25.0-29.9) - long discussion about weight loss, diet, and exercise -recommended diet heavy in fruits and veggies and low in animal meats, cheeses, and dairy products  54 pack year smoking history/lung nodules 54 pack, no symptoms, quit 15 years ago Continue annual low dose non-contrast nodule follow up per radiology, due 11/2019    Future Appointments  Date Time Provider [place]  08/04/2019  [place] [facility]  10/20/2019 10:30 AM Unk Pinto, MD [facility] None  05/04/2020  9:00 AM Unk Pinto, MD  [facility] None    Continue diet and meds as discussed. Further disposition pending results of labs. During the course of the visit the patient was educated and counseled about appropriate screening and preventive services including:    Pneumococcal vaccine   Influenza vaccine  Td vaccine  Screening electrocardiogram  Screening mammography  Bone densitometry screening  Colorectal cancer screening  Diabetes screening  Glaucoma screening  Nutrition counseling   Advanced directives: given info/requested  HPI 69 y.o. male  presents for 3 month follow up with hypertension, hyperlipidemia, prediabetes and vitamin D and medicare wellness visit.    He is on vesicare for OAB per Dr. Karsten Ro. He is on wellbutrin for anxiety/depression which helps.   BMI is Body mass index is 28.02 kg/m., he has not been working on diet and exercise, walks more when warm, will restart in the spring.  Wt Readings from Last 3 Encounters:  07/21/19 187 lb (84.8 kg)  04/14/19 188 lb (85.3 kg)  01/06/19 187 lb 9.6 oz (85.1 kg)    His blood pressure has been controlled at home, today their BP is BP: 110/70.    He does not workout. He denies chest pain, dizziness.   He is a former smoker, 54 pack years, quit in 2005, does have diagnosis of COPD via CXR in 2013, today he endorses ongoing exertional dyspnea with exercise, denies fatigue, CP or other accompaniments. He currently uses PRN albuterol which does work well for him. He has not tried any daily inhalers. Denies coughing or AM secretions.   Had low dose screening chest CT in 11/2018 that showed multiple nodules felt to be benign appearing, recommended annual follow up.  CT also shows aortic atherosclerosis and  emphysema.    He is on cholesterol medication, he is on 1 lipitor 3 days a week and denies myalgias. His cholesterol is at goal. The cholesterol last visit was:   Lab Results  Component Value Date   CHOL 152 04/14/2019   HDL 51  04/14/2019   LDLCALC 83 04/14/2019   TRIG 86 04/14/2019   CHOLHDL 3.0 04/14/2019    He has been working on diet and exercise for prediabetes, and denies foot ulcerations, hyperglycemia, hypoglycemia , increased appetite, nausea, paresthesia of the feet, polydipsia, polyuria, visual disturbances, vomiting and weight loss. Last A1C in the office was:  Lab Results  Component Value Date   HGBA1C 5.5 04/14/2019   Patient is on Vitamin D supplement.  Lab Results  Component Value Date   VD25OH 54 04/14/2019    He has labile CKD III GFR values which are monitored closely.  Lab Results  Component Value Date   GFRNONAA 41 (L) 04/14/2019     Current Medications:  Current Outpatient Medications on File Prior to Visit  Medication Sig Dispense Refill  . albuterol (VENTOLIN HFA) 108 (90 Base) MCG/ACT inhaler Inhale 2 puffs into the lungs every 4 (four) hours as needed for wheezing or shortness of breath. 1 Inhaler 0  . aspirin EC 81 MG tablet Take 81 mg by mouth daily. Take 1 tablet (81 mg) by mouth 3 times week - Sunday, Wednesday and Friday - with lunch    . atorvastatin (LIPITOR) 40 MG tablet Take 1 tablet Daily for Cholesterol 90 tablet 3  . bisoprolol-hydrochlorothiazide (ZIAC) 5-6.25 MG tablet Take 1 Tablet Daily for BP 90 tablet 3  . buPROPion (WELLBUTRIN XL) 150 MG 24 hr tablet Take 1 tablet Daily for Mood 90 tablet 3  . Cholecalciferol (VITAMIN D-3) 1000 UNITS CAPS Take 5,000 Units by mouth daily.     . famotidine (PEPCID) 40 MG tablet Take 1 tablet at Bedtime for   Acid Indigestion & Reflux 90 tablet 3  . solifenacin (VESICARE) 10 MG tablet Take 1 tablet Daily for Bladder 90 tablet 3   No current facility-administered medications on file prior to visit.    Medical History:  Past Medical History:  Diagnosis Date  . Acute appendicitis 11/24/2016  . COPD (chronic obstructive pulmonary disease) ([place])   . GERD (gastroesophageal reflux disease)   . Hematuria   . HLD (hyperlipidemia)    . Hypertension   . IBS (irritable bowel syndrome)   . Pre-diabetes   . Vitamin D deficiency    Preventative care:  Immunization History  Administered Date(s) Administered  . Influenza Whole 04/07/2013  . Influenza, High Dose Seasonal PF 04/13/2014, 03/27/2015, 03/25/2016, 04/08/2018, 04/14/2019  . PFIZER SARS-COV-2 Vaccination 07/15/2019  . PPD Test 12/05/2013  . Pneumococcal Conjugate-13 06/08/2014  . Pneumococcal Polysaccharide-23 09/07/2014  . Tdap 04/29/2011  . Zoster 06/12/2014    Last colonoscopy: 2017 Dr. Fuller Plan. 10 year follow up EGD: 01/2013 DEXA: N/A CXR 09/02/2013 CT chest 11/2018 - nodules - 1 year follow up Ct AB 11/2016  Prior vaccinations: TD or Tdap: 2012         Influenza: 2020 Pneumococcal: 2016 Prevnar13: 2015 Shingles/Zostavax: 2015 Covid 19: 1/2   Names of Other Physician/Practitioners you currently use: 1. [facility]- here for primary care 2. Dr. Katy Apo, eye doctor, last visit 2020, yearly 3. Dr. Jasmine December, last visit 12/2018, goes q51m  Patient Care Team: Unk Pinto, MD as PCP - General (Internal Medicine) Katy Apo, MD as Consulting  Physician (Ophthalmology) Ladene Artist, MD as Consulting Physician (Gastroenterology) Eula Listen, DDS as Referring Physician (Dentistry)   Allergies No Known Allergies  SURGICAL HISTORY He  has a past surgical history that includes Cervical fusion; Trigger finger release (Right); Carpal tunnel release (Right); Hand surgery (Left); and laparoscopic appendectomy (N/A, 11/24/2016). FAMILY HISTORY His family history includes COPD in his mother; Cancer in his father; Diverticulosis in his mother; Hypertension in his mother; Irritable bowel syndrome in his mother; Kidney disease in his father; Lung cancer in his father; Lupus in his mother; Other in his father; Stroke in his father. SOCIAL HISTORY He  reports that he quit smoking about 15 years ago. His  smoking use included cigarettes. He has a 52.50 pack-year smoking history. He has never used smokeless tobacco. He reports current alcohol use. He reports that he does not use drugs.  MEDICARE WELLNESS OBJECTIVES: Physical activity:   Cardiac risk factors:   Depression/mood screen:   Depression screen [facility] 2/9 04/17/2019  Decreased Interest 0  Down, Depressed, Hopeless 0  PHQ - 2 Score 0    ADLs:  In your present state of health, do you have any difficulty performing the following activities: 04/17/2019  Hearing? N  Vision? N  Difficulty concentrating or making decisions? N  Walking or climbing stairs? N  Dressing or bathing? N  Doing errands, shopping? N  Some recent data might be hidden     Cognitive Testing  Alert? Yes  Normal Appearance?Yes  Oriented to person? Yes  Place? Yes   Time? Yes  Recall of three objects?  Yes  Can perform simple calculations? Yes  Displays appropriate judgment?Yes  Can read the correct time from a watch face?Yes  EOL planning: Does Patient Have a Medical Advance Directive?: Yes Type of Advance Directive: Healthcare Power of Attorney, Living will Does patient want to make changes to medical advance directive?: No - Patient declined Copy of [place] in Chart?: No - copy requested    Review of Systems:  Review of Systems  Constitutional: Negative for chills, fever and malaise/fatigue.  HENT: Negative for congestion, ear pain and sore throat.   Respiratory: Negative for cough, shortness of breath and wheezing.   Cardiovascular: Negative for chest pain, palpitations and leg swelling.  Gastrointestinal: Negative for blood in stool, constipation, diarrhea, heartburn and melena.  Genitourinary: Negative.   Musculoskeletal: Negative for myalgias.  Skin: Negative.   Neurological: Negative for dizziness, sensory change, loss of consciousness and headaches.  Psychiatric/Behavioral: Negative for depression. The patient is not  nervous/anxious and does not have insomnia.     Physical Exam: BP 110/70   Pulse 78   Temp (!) 97.3 F (36.3 C)   Wt 187 lb (84.8 kg)   SpO2 97%   BMI 28.02 kg/m  Wt Readings from Last 3 Encounters:  07/21/19 187 lb (84.8 kg)  04/14/19 188 lb (85.3 kg)  01/06/19 187 lb 9.6 oz (85.1 kg)    General Appearance: Well nourished well developed, in no apparent distress. Eyes: PERRLA, EOMs, conjunctiva no swelling or erythema ENT/Mouth: Ear canals normal, without swelling, erythma, discharge.  TMs normal bilaterally.  Oropharynx moist, clear, without exudate, or postoropharyngeal swelling. Neck: Supple, thyroid normal,no cervical adenopathy  Respiratory: Respiratory effort normal, Breath sounds clear A&P without rhonchi, wheeze, or rale.  No retractions, no accessory usage. Cardio: RRR with no MRGs. Brisk peripheral pulses without edema.  Abdomen: Soft, + BS,  Non tender, no guarding, rebound, hernias, masses. Musculoskeletal: Full ROM,  5/5 strength, Normal gait Skin: Warm, dry without rashes, lesions, ecchymosis.  Neuro: Awake and oriented X 3, Cranial nerves intact. Normal muscle tone, no cerebellar symptoms. Psych: Normal affect, Insight and Judgment appropriate.    Medicare Attestation I have personally reviewed: The patient's medical and social history Their use of alcohol, tobacco or illicit drugs Their current medications and supplements The patient's functional ability including ADLs,fall risks, home safety risks, cognitive, and hearing and visual impairment Diet and physical activities Evidence for depression or mood disorders  The patient's weight, height, BMI, and visual acuity have been recorded in the chart.  I have made referrals, counseling, and provided education to the patient based on review of the above and I have provided the patient with a written personalized care plan for preventive services.   Izora Ribas, NP 10:55 AM [facility]

## 2019-07-21 ENCOUNTER — Encounter: Payer: Self-pay | Admitting: Adult Health

## 2019-07-21 ENCOUNTER — Other Ambulatory Visit: Payer: Self-pay

## 2019-07-21 ENCOUNTER — Ambulatory Visit (INDEPENDENT_AMBULATORY_CARE_PROVIDER_SITE_OTHER): Payer: Medicare Other | Admitting: Adult Health

## 2019-07-21 VITALS — BP 110/70 | HR 78 | Temp 97.3°F | Wt 187.0 lb

## 2019-07-21 DIAGNOSIS — N3281 Overactive bladder: Secondary | ICD-10-CM

## 2019-07-21 DIAGNOSIS — E559 Vitamin D deficiency, unspecified: Secondary | ICD-10-CM

## 2019-07-21 DIAGNOSIS — I10 Essential (primary) hypertension: Secondary | ICD-10-CM

## 2019-07-21 DIAGNOSIS — K5790 Diverticulosis of intestine, part unspecified, without perforation or abscess without bleeding: Secondary | ICD-10-CM

## 2019-07-21 DIAGNOSIS — Z0000 Encounter for general adult medical examination without abnormal findings: Secondary | ICD-10-CM

## 2019-07-21 DIAGNOSIS — K219 Gastro-esophageal reflux disease without esophagitis: Secondary | ICD-10-CM

## 2019-07-21 DIAGNOSIS — R7309 Other abnormal glucose: Secondary | ICD-10-CM

## 2019-07-21 DIAGNOSIS — Z87891 Personal history of nicotine dependence: Secondary | ICD-10-CM

## 2019-07-21 DIAGNOSIS — N183 Chronic kidney disease, stage 3 unspecified: Secondary | ICD-10-CM | POA: Diagnosis not present

## 2019-07-21 DIAGNOSIS — E782 Mixed hyperlipidemia: Secondary | ICD-10-CM | POA: Diagnosis not present

## 2019-07-21 DIAGNOSIS — J449 Chronic obstructive pulmonary disease, unspecified: Secondary | ICD-10-CM

## 2019-07-21 DIAGNOSIS — I700 Atherosclerosis of aorta: Secondary | ICD-10-CM

## 2019-07-21 DIAGNOSIS — Z79899 Other long term (current) drug therapy: Secondary | ICD-10-CM

## 2019-07-21 DIAGNOSIS — E663 Overweight: Secondary | ICD-10-CM

## 2019-07-21 DIAGNOSIS — R918 Other nonspecific abnormal finding of lung field: Secondary | ICD-10-CM

## 2019-07-21 DIAGNOSIS — F411 Generalized anxiety disorder: Secondary | ICD-10-CM

## 2019-07-21 MED ORDER — TRELEGY ELLIPTA 100-62.5-25 MCG/INH IN AEPB: 100.0000 ug | INHALATION_SPRAY | Freq: Every day | RESPIRATORY_TRACT | 0 refills | Status: DC

## 2019-07-21 NOTE — Patient Instructions (Addendum)
[name] Park , Thank you for taking time to come for your Medicare Wellness Visit. I appreciate your ongoing commitment to your health goals. Please review the following plan we discussed and let me know if I can assist you in the future.   These are the goals we discussed: Goals    . Exercise 150 min/wk Moderate Activity    . Weight (lb) < 180 lb (81.6 kg)       This is a list of the screening recommended for you and due dates:  Health Maintenance  Topic Date Due  . Tetanus Vaccine  04/28/2021  . Colon Cancer Screening  02/27/2026  . Flu Shot  Completed  .  Hepatitis C: One time screening is recommended by Center for Disease Control  (CDC) for  adults born from [birth year] through 1967.   Completed  . Pneumonia vaccines  Completed     Try trelegy 1 puff once daily, Rinse mouth afterwards to prevent thrush  Let me know if this helps with breathing symptoms   Can also try taking 1 puff of albuterol before exertion     Fluticasone; Umeclidinium; Vilanterol inhalation powder What is this medicine? FLUTICASONE; UMECLIDINIUM; VILANTEROL (floo TIK a sone; ue MEK li DIN ee um; vye LAN ter ol) inhalation is a combination of 3 drugs to treat COPD and asthma. Umeclidinium and Vilanterol are bronchodilators that help keep airways open. Fluticasone decreases inflammation in the lungs. Do not use this drug combination for acute asthma attacks or bronchospasm. This medicine may be used for other purposes; ask your health care provider or pharmacist if you have questions. COMMON BRAND NAME(S): TRELEGY ELLIPTA What should I tell my health care provider before I take this medicine? They need to know if you have any of these conditions:  bone problems  diabetes  eye disease, vision problems  heart disease  high blood pressure  history of irregular heartbeat  immune system problems  infection  kidney disease  pheochromocytoma  prostate disease  seizures  thyroid disease  trouble  passing urine  an unusual or allergic reaction to fluticasone, umeclidinium, vilanterol, lactose, milk proteins, other medicines, foods, dyes, or preservatives  pregnant or trying to get pregnant  breast-feeding How should I use this medicine? This drug is inhaled through the mouth. Rinse your mouth with water after use. Make sure not to swallow the water. Take it as directed on the prescription label at the same time every day. Do not use it more often than directed. A special MedGuide will be given to you by the pharmacist with each prescription and refill. Be sure to read this information carefully each time. Talk to your pediatrician about the use of this drug in children. Special care may be needed. Overdosage: If you think you have taken too much of this medicine contact a poison control center or emergency room at once. NOTE: This medicine is only for you. Do not share this medicine with others. What if I miss a dose? If you miss a dose, take it as soon as you can. If it is almost time for your next dose, take only that dose. Do not take double or extra doses. What may interact with this medicine? Do not take this medicine with any of the following medications:  cisapride  dofetilide  dronedarone  MAOIs like Carbex, Eldepryl, Marplan, Nardil, and Parnate  pimozide  thioridazine  ziprasidone This medicine may also interact with the following medications:  aclidinium  antihistamines for allergy  antiviral medicines for HIV or AIDS  atropine  beta-blockers like metoprolol and propranolol  certain antibiotics like clarithromycin and telithromycin  certain medicines for bladder problems like oxybutynin, tolterodine  certain medicines for depression, anxiety, or psychotic disturbances  certain medicines for fungal infections like ketoconazole, itraconazole, posaconazole, voriconazole  certain medicines for Parkinson's disease like benztropine,  trihexyphenidyl  certain medicines for stomach problems like dicyclomine, hyoscyamine  certain medicines for travel sickness like scopolamine  conivaptan  diuretics  ipratropium  medicines for colds  other medicines for breathing problems  other medicines that prolong the QT interval (cause an abnormal heart rhythm)  nefazodone  tiotropium This list may not describe all possible interactions. Give your health care provider a list of all the medicines, herbs, non-prescription drugs, or dietary supplements you use. Also tell them if you smoke, drink alcohol, or use illegal drugs. Some items may interact with your medicine. What should I watch for while using this medicine? Visit your doctor or health care professional for regular checkups. Tell your doctor or health care professional if your symptoms do not get better. Do not use this medicine more than once every 24 hours. NEVER use this medicine for an acute asthma or COPD attack. You should use your short-acting rescue inhalers for this purpose. If your symptoms get worse or if you need your short-acting inhalers more often, call your doctor right away. If you are going to have surgery tell your doctor or health care professional that you are using this medicine. Try not to come in contact with people with the chicken pox or measles. If you do, call your doctor. This medicine may increase blood sugar. Ask your healthcare provider if changes in diet or medicines are needed if you have diabetes. What side effects may I notice from receiving this medicine? Side effects that you should report to your doctor or health care professional as soon as possible:  allergic reactions like skin rash or hives, swelling of the face, lips, or tongue  breathing problems right after inhaling your medicine  chest pain  eye pain  fast, irregular heartbeat  feeling faint or lightheaded, falls  fever or chills  nausea, vomiting  signs and  symptoms of high blood sugar such as being more thirsty or hungry or having to urinate more than normal. You may also feel very tired or have blurry vision.  trouble passing urine Side effects that usually do not require medical attention (report these to your doctor or health care professional if they continue or are bothersome):  back pain  changes in taste  cough  diarrhea  headache  nervousness  sore throat  tremor This list may not describe all possible side effects. Call your doctor for medical advice about side effects. You may report side effects to FDA at 1-800-FDA-1088. Where should I keep my medicine? Keep out of the reach of children and pets. Store at room temperature between 20 and 25 degrees C (68 and 77 degrees F). Keep inhaler away from extreme heat, cold or humidity. Throw away 6 weeks after removing it from the foil pouch, when the dose counter reads "0" or after the expiration date, whichever is first. NOTE: This sheet is a summary. It may not cover all possible information. If you have questions about this medicine, talk to your doctor, pharmacist, or health care provider.  2020 Elsevier/Gold Standard (2019-04-18 12:45:04)

## 2019-07-22 LAB — LIPID PANEL
Cholesterol: 126 mg/dL (ref <200)
HDL: 50 mg/dL (ref >=40)
LDL Cholesterol (Calc): 58 mg/dL (calc)
Non-HDL Cholesterol (Calc): 76 mg/dL (calc) (ref <130)
Total CHOL/HDL Ratio: 2.5 (calc) (ref <5.0)
Triglycerides: 95 mg/dL (ref <150)

## 2019-07-22 LAB — CBC WITH DIFFERENTIAL/PLATELET
Absolute Monocytes: 572 cells/uL (ref 200–950)
Basophils Absolute: 32 cells/uL (ref 0–200)
Basophils Relative: 0.6 %
Eosinophils Absolute: 130 cells/uL (ref 15–500)
Eosinophils Relative: 2.4 %
HCT: 38.2 % — ABNORMAL LOW (ref 38.5–50.0)
Hemoglobin: 12.8 g/dL — ABNORMAL LOW (ref 13.2–17.1)
Lymphs Abs: 1129 cells/uL (ref 850–3900)
MCH: 29.9 pg (ref 27.0–33.0)
MCHC: 33.5 g/dL (ref 32.0–36.0)
MCV: 89.3 fL (ref 80.0–100.0)
MPV: 11.5 fL (ref 7.5–12.5)
Monocytes Relative: 10.6 %
Neutro Abs: 3537 cells/uL (ref 1500–7800)
Neutrophils Relative %: 65.5 %
Platelets: 265 10*3/uL (ref 140–400)
RBC: 4.28 10*6/uL (ref 4.20–5.80)
RDW: 12.5 % (ref 11.0–15.0)
Total Lymphocyte: 20.9 %
WBC: 5.4 10*3/uL (ref 3.8–10.8)

## 2019-07-22 LAB — COMPLETE METABOLIC PANEL WITH GFR
AG Ratio: 1.8 (calc) (ref 1.0–2.5)
ALT: 27 U/L (ref 9–46)
AST: 24 U/L (ref 10–35)
Albumin: 4.2 g/dL (ref 3.6–5.1)
Alkaline phosphatase (APISO): 82 U/L (ref 35–144)
BUN/Creatinine Ratio: 13 (calc) (ref 6–22)
BUN: 20 mg/dL (ref 7–25)
CO2: 30 mmol/L (ref 20–32)
Calcium: 9.6 mg/dL (ref 8.6–10.3)
Chloride: 102 mmol/L (ref 98–110)
Creat: 1.49 mg/dL — ABNORMAL HIGH (ref 0.70–1.18)
GFR, Est African American: 54 mL/min/{1.73_m2} — ABNORMAL LOW (ref >=60)
GFR, Est Non African American: 47 mL/min/{1.73_m2} — ABNORMAL LOW (ref >=60)
Globulin: 2.3 g/dL (calc) (ref 1.9–3.7)
Glucose, Bld: 93 mg/dL (ref 65–99)
Potassium: 4.3 mmol/L (ref 3.5–5.3)
Sodium: 139 mmol/L (ref 135–146)
Total Bilirubin: 0.5 mg/dL (ref 0.2–1.2)
Total Protein: 6.5 g/dL (ref 6.1–8.1)

## 2019-07-22 LAB — TSH: TSH: 0.79 mIU/L (ref 0.40–4.50)

## 2019-07-22 LAB — MAGNESIUM: Magnesium: 2.1 mg/dL (ref 1.5–2.5)

## 2019-08-04 ENCOUNTER — Ambulatory Visit: Payer: Medicare Other | Attending: Internal Medicine

## 2019-08-04 DIAGNOSIS — Z23 Encounter for immunization: Secondary | ICD-10-CM | POA: Insufficient documentation

## 2019-08-04 NOTE — Progress Notes (Signed)
   Covid-19 Vaccination Clinic  Name:  [name] Park    MRN: [id number] DOB: 06-28-1950  08/04/2019  Mr. Santillo was observed post Covid-19 immunization for 15 minutes without incidence. He was provided with Vaccine Information Sheet and instruction to access the V-Safe system.   Mr. Manis was instructed to call 911 with any severe reactions post vaccine: Marland Kitchen Difficulty breathing  . Swelling of your face and throat  . A fast heartbeat  . A bad rash all over your body  . Dizziness and weakness    Immunizations Administered    Name Date Dose VIS Date Route   Pfizer COVID-19 Vaccine 08/04/2019  9:10 AM 0.3 mL 06/03/2019 Intramuscular   Manufacturer: [employer], [employer]   Lot: [id number]   [place]: [id number]

## 2019-08-29 ENCOUNTER — Other Ambulatory Visit: Payer: Self-pay

## 2019-08-29 DIAGNOSIS — Z1211 Encounter for screening for malignant neoplasm of colon: Secondary | ICD-10-CM

## 2019-08-29 LAB — POC HEMOCCULT BLD/STL (HOME/3-CARD/SCREEN)
Card #2 Fecal Occult Blod, POC: NEGATIVE
Card #3 Fecal Occult Blood, POC: NEGATIVE
Fecal Occult Blood, POC: NEGATIVE

## 2019-08-30 DIAGNOSIS — Z1212 Encounter for screening for malignant neoplasm of rectum: Secondary | ICD-10-CM | POA: Diagnosis not present

## 2019-08-30 DIAGNOSIS — Z1211 Encounter for screening for malignant neoplasm of colon: Secondary | ICD-10-CM | POA: Diagnosis not present

## 2019-10-03 DIAGNOSIS — H5211 Myopia, right eye: Secondary | ICD-10-CM | POA: Diagnosis not present

## 2019-10-03 DIAGNOSIS — Z961 Presence of intraocular lens: Secondary | ICD-10-CM | POA: Diagnosis not present

## 2019-10-03 DIAGNOSIS — H31002 Unspecified chorioretinal scars, left eye: Secondary | ICD-10-CM | POA: Diagnosis not present

## 2019-10-03 DIAGNOSIS — H26493 Other secondary cataract, bilateral: Secondary | ICD-10-CM | POA: Diagnosis not present

## 2019-10-14 ENCOUNTER — Encounter (HOSPITAL_COMMUNITY): Payer: Self-pay

## 2019-10-14 ENCOUNTER — Other Ambulatory Visit: Payer: Self-pay

## 2019-10-14 ENCOUNTER — Inpatient Hospital Stay (HOSPITAL_COMMUNITY)
Admission: EM | Admit: 2019-10-14 | Discharge: 2019-10-18 | DRG: 872 | Disposition: A | Discharge status: Home / Self Care | Payer: Medicare Other | Attending: Internal Medicine | Admitting: Internal Medicine

## 2019-10-14 DIAGNOSIS — A419 Sepsis, unspecified organism: Principal | ICD-10-CM | POA: Diagnosis present

## 2019-10-14 DIAGNOSIS — R000 Tachycardia, unspecified: Secondary | ICD-10-CM | POA: Diagnosis not present

## 2019-10-14 DIAGNOSIS — Z79899 Other long term (current) drug therapy: Secondary | ICD-10-CM

## 2019-10-14 DIAGNOSIS — N1832 Chronic kidney disease, stage 3b: Secondary | ICD-10-CM

## 2019-10-14 DIAGNOSIS — Z801 Family history of malignant neoplasm of trachea, bronchus and lung: Secondary | ICD-10-CM

## 2019-10-14 DIAGNOSIS — E785 Hyperlipidemia, unspecified: Secondary | ICD-10-CM | POA: Diagnosis present

## 2019-10-14 DIAGNOSIS — Z981 Arthrodesis status: Secondary | ICD-10-CM

## 2019-10-14 DIAGNOSIS — A0811 Acute gastroenteropathy due to Norwalk agent: Secondary | ICD-10-CM

## 2019-10-14 DIAGNOSIS — K4090 Unilateral inguinal hernia, without obstruction or gangrene, not specified as recurrent: Secondary | ICD-10-CM | POA: Diagnosis not present

## 2019-10-14 DIAGNOSIS — Z8249 Family history of ischemic heart disease and other diseases of the circulatory system: Secondary | ICD-10-CM

## 2019-10-14 DIAGNOSIS — J449 Chronic obstructive pulmonary disease, unspecified: Secondary | ICD-10-CM | POA: Diagnosis present

## 2019-10-14 DIAGNOSIS — Z20822 Contact with and (suspected) exposure to covid-19: Secondary | ICD-10-CM | POA: Diagnosis not present

## 2019-10-14 DIAGNOSIS — E559 Vitamin D deficiency, unspecified: Secondary | ICD-10-CM | POA: Diagnosis not present

## 2019-10-14 DIAGNOSIS — R112 Nausea with vomiting, unspecified: Secondary | ICD-10-CM

## 2019-10-14 DIAGNOSIS — R1110 Vomiting, unspecified: Secondary | ICD-10-CM | POA: Diagnosis not present

## 2019-10-14 DIAGNOSIS — K589 Irritable bowel syndrome without diarrhea: Secondary | ICD-10-CM | POA: Diagnosis not present

## 2019-10-14 DIAGNOSIS — Z841 Family history of disorders of kidney and ureter: Secondary | ICD-10-CM

## 2019-10-14 DIAGNOSIS — F329 Major depressive disorder, single episode, unspecified: Secondary | ICD-10-CM | POA: Diagnosis present

## 2019-10-14 DIAGNOSIS — I10 Essential (primary) hypertension: Secondary | ICD-10-CM | POA: Diagnosis not present

## 2019-10-14 DIAGNOSIS — E876 Hypokalemia: Secondary | ICD-10-CM | POA: Diagnosis not present

## 2019-10-14 DIAGNOSIS — Z87891 Personal history of nicotine dependence: Secondary | ICD-10-CM

## 2019-10-14 DIAGNOSIS — R8281 Pyuria: Secondary | ICD-10-CM | POA: Diagnosis present

## 2019-10-14 DIAGNOSIS — K219 Gastro-esophageal reflux disease without esophagitis: Secondary | ICD-10-CM | POA: Diagnosis present

## 2019-10-14 DIAGNOSIS — Z7982 Long term (current) use of aspirin: Secondary | ICD-10-CM

## 2019-10-14 DIAGNOSIS — Z832 Family history of diseases of the blood and blood-forming organs and certain disorders involving the immune mechanism: Secondary | ICD-10-CM | POA: Diagnosis not present

## 2019-10-14 DIAGNOSIS — I129 Hypertensive chronic kidney disease with stage 1 through stage 4 chronic kidney disease, or unspecified chronic kidney disease: Secondary | ICD-10-CM | POA: Diagnosis present

## 2019-10-14 DIAGNOSIS — Z825 Family history of asthma and other chronic lower respiratory diseases: Secondary | ICD-10-CM

## 2019-10-14 DIAGNOSIS — N179 Acute kidney failure, unspecified: Secondary | ICD-10-CM | POA: Diagnosis present

## 2019-10-14 DIAGNOSIS — I700 Atherosclerosis of aorta: Secondary | ICD-10-CM | POA: Diagnosis present

## 2019-10-14 DIAGNOSIS — K2970 Gastritis, unspecified, without bleeding: Secondary | ICD-10-CM | POA: Diagnosis present

## 2019-10-14 DIAGNOSIS — N390 Urinary tract infection, site not specified: Secondary | ICD-10-CM

## 2019-10-14 DIAGNOSIS — Z823 Family history of stroke: Secondary | ICD-10-CM

## 2019-10-14 DIAGNOSIS — A059 Bacterial foodborne intoxication, unspecified: Secondary | ICD-10-CM | POA: Diagnosis not present

## 2019-10-14 DIAGNOSIS — F419 Anxiety disorder, unspecified: Secondary | ICD-10-CM | POA: Diagnosis present

## 2019-10-14 DIAGNOSIS — K429 Umbilical hernia without obstruction or gangrene: Secondary | ICD-10-CM | POA: Diagnosis not present

## 2019-10-14 NOTE — ED Triage Notes (Signed)
Pt states he ate shrimp at [employer] tonight, onset of vomiting and diarrhea approx 1830.   Pt vomited last approx 9 pm.  Pt admits to mild stomach cramping at the time which has improved now.

## 2019-10-15 ENCOUNTER — Emergency Department (HOSPITAL_COMMUNITY): Payer: Medicare Other

## 2019-10-15 DIAGNOSIS — Z825 Family history of asthma and other chronic lower respiratory diseases: Secondary | ICD-10-CM | POA: Diagnosis not present

## 2019-10-15 DIAGNOSIS — Z823 Family history of stroke: Secondary | ICD-10-CM | POA: Diagnosis not present

## 2019-10-15 DIAGNOSIS — A059 Bacterial foodborne intoxication, unspecified: Secondary | ICD-10-CM | POA: Diagnosis present

## 2019-10-15 DIAGNOSIS — K2970 Gastritis, unspecified, without bleeding: Secondary | ICD-10-CM | POA: Diagnosis present

## 2019-10-15 DIAGNOSIS — R112 Nausea with vomiting, unspecified: Secondary | ICD-10-CM | POA: Insufficient documentation

## 2019-10-15 DIAGNOSIS — R000 Tachycardia, unspecified: Secondary | ICD-10-CM | POA: Diagnosis not present

## 2019-10-15 DIAGNOSIS — I129 Hypertensive chronic kidney disease with stage 1 through stage 4 chronic kidney disease, or unspecified chronic kidney disease: Secondary | ICD-10-CM | POA: Diagnosis present

## 2019-10-15 DIAGNOSIS — F419 Anxiety disorder, unspecified: Secondary | ICD-10-CM | POA: Diagnosis present

## 2019-10-15 DIAGNOSIS — Z87891 Personal history of nicotine dependence: Secondary | ICD-10-CM | POA: Diagnosis not present

## 2019-10-15 DIAGNOSIS — N390 Urinary tract infection, site not specified: Secondary | ICD-10-CM

## 2019-10-15 DIAGNOSIS — K429 Umbilical hernia without obstruction or gangrene: Secondary | ICD-10-CM | POA: Diagnosis not present

## 2019-10-15 DIAGNOSIS — Z832 Family history of diseases of the blood and blood-forming organs and certain disorders involving the immune mechanism: Secondary | ICD-10-CM | POA: Diagnosis not present

## 2019-10-15 DIAGNOSIS — N1832 Chronic kidney disease, stage 3b: Secondary | ICD-10-CM

## 2019-10-15 DIAGNOSIS — E785 Hyperlipidemia, unspecified: Secondary | ICD-10-CM | POA: Diagnosis present

## 2019-10-15 DIAGNOSIS — A419 Sepsis, unspecified organism: Secondary | ICD-10-CM | POA: Diagnosis present

## 2019-10-15 DIAGNOSIS — E559 Vitamin D deficiency, unspecified: Secondary | ICD-10-CM | POA: Diagnosis present

## 2019-10-15 DIAGNOSIS — K4090 Unilateral inguinal hernia, without obstruction or gangrene, not specified as recurrent: Secondary | ICD-10-CM | POA: Diagnosis not present

## 2019-10-15 DIAGNOSIS — J449 Chronic obstructive pulmonary disease, unspecified: Secondary | ICD-10-CM | POA: Diagnosis present

## 2019-10-15 DIAGNOSIS — I10 Essential (primary) hypertension: Secondary | ICD-10-CM

## 2019-10-15 DIAGNOSIS — Z981 Arthrodesis status: Secondary | ICD-10-CM | POA: Diagnosis not present

## 2019-10-15 DIAGNOSIS — E876 Hypokalemia: Secondary | ICD-10-CM | POA: Diagnosis present

## 2019-10-15 DIAGNOSIS — R8281 Pyuria: Secondary | ICD-10-CM | POA: Diagnosis present

## 2019-10-15 DIAGNOSIS — N179 Acute kidney failure, unspecified: Secondary | ICD-10-CM | POA: Diagnosis not present

## 2019-10-15 DIAGNOSIS — K589 Irritable bowel syndrome without diarrhea: Secondary | ICD-10-CM | POA: Diagnosis present

## 2019-10-15 DIAGNOSIS — A0811 Acute gastroenteropathy due to Norwalk agent: Secondary | ICD-10-CM | POA: Diagnosis present

## 2019-10-15 DIAGNOSIS — Z841 Family history of disorders of kidney and ureter: Secondary | ICD-10-CM | POA: Diagnosis not present

## 2019-10-15 DIAGNOSIS — Z20822 Contact with and (suspected) exposure to covid-19: Secondary | ICD-10-CM | POA: Diagnosis not present

## 2019-10-15 DIAGNOSIS — F329 Major depressive disorder, single episode, unspecified: Secondary | ICD-10-CM | POA: Diagnosis present

## 2019-10-15 DIAGNOSIS — K219 Gastro-esophageal reflux disease without esophagitis: Secondary | ICD-10-CM | POA: Diagnosis present

## 2019-10-15 DIAGNOSIS — Z8249 Family history of ischemic heart disease and other diseases of the circulatory system: Secondary | ICD-10-CM | POA: Diagnosis not present

## 2019-10-15 DIAGNOSIS — R1110 Vomiting, unspecified: Secondary | ICD-10-CM | POA: Diagnosis present

## 2019-10-15 LAB — COMPREHENSIVE METABOLIC PANEL
ALT: 33 U/L (ref 0–44)
AST: 34 U/L (ref 15–41)
Albumin: 4.8 g/dL (ref 3.5–5.0)
Alkaline Phosphatase: 95 U/L (ref 38–126)
Anion gap: 14 (ref 5–15)
BUN: 36 mg/dL — ABNORMAL HIGH (ref 8–23)
CO2: 21 mmol/L — ABNORMAL LOW (ref 22–32)
Calcium: 9.8 mg/dL (ref 8.9–10.3)
Chloride: 99 mmol/L (ref 98–111)
Creatinine, Ser: 2.13 mg/dL — ABNORMAL HIGH (ref 0.61–1.24)
GFR calc Af Amer: 35 mL/min — ABNORMAL LOW (ref >60)
GFR calc non Af Amer: 30 mL/min — ABNORMAL LOW (ref >60)
Glucose, Bld: 152 mg/dL — ABNORMAL HIGH (ref 70–99)
Potassium: 4.2 mmol/L (ref 3.5–5.1)
Sodium: 134 mmol/L — ABNORMAL LOW (ref 135–145)
Total Bilirubin: 1.2 mg/dL (ref 0.3–1.2)
Total Protein: 8.6 g/dL — ABNORMAL HIGH (ref 6.5–8.1)

## 2019-10-15 LAB — BASIC METABOLIC PANEL
Anion gap: 12 (ref 5–15)
BUN: 34 mg/dL — ABNORMAL HIGH (ref 8–23)
CO2: 24 mmol/L (ref 22–32)
Calcium: 8.9 mg/dL (ref 8.9–10.3)
Chloride: 100 mmol/L (ref 98–111)
Creatinine, Ser: 1.93 mg/dL — ABNORMAL HIGH (ref 0.61–1.24)
GFR calc Af Amer: 40 mL/min — ABNORMAL LOW (ref >60)
GFR calc non Af Amer: 34 mL/min — ABNORMAL LOW (ref >60)
Glucose, Bld: 116 mg/dL — ABNORMAL HIGH (ref 70–99)
Potassium: 4 mmol/L (ref 3.5–5.1)
Sodium: 136 mmol/L (ref 135–145)

## 2019-10-15 LAB — CBC WITH DIFFERENTIAL/PLATELET
Abs Immature Granulocytes: 0.04 10*3/uL (ref 0.00–0.07)
Basophils Absolute: 0 10*3/uL (ref 0.0–0.1)
Basophils Relative: 0 %
Eosinophils Absolute: 0.1 10*3/uL (ref 0.0–0.5)
Eosinophils Relative: 1 %
HCT: 47.3 % (ref 39.0–52.0)
Hemoglobin: 15.7 g/dL (ref 13.0–17.0)
Immature Granulocytes: 0 %
Lymphocytes Relative: 2 %
Lymphs Abs: 0.2 10*3/uL — ABNORMAL LOW (ref 0.7–4.0)
MCH: 30.7 pg (ref 26.0–34.0)
MCHC: 33.2 g/dL (ref 30.0–36.0)
MCV: 92.6 fL (ref 80.0–100.0)
Monocytes Absolute: 0.7 10*3/uL (ref 0.1–1.0)
Monocytes Relative: 5 %
Neutro Abs: 12.2 10*3/uL — ABNORMAL HIGH (ref 1.7–7.7)
Neutrophils Relative %: 92 %
Platelets: 266 10*3/uL (ref 150–400)
RBC: 5.11 MIL/uL (ref 4.22–5.81)
RDW: 13.2 % (ref 11.5–15.5)
WBC: 13.2 10*3/uL — ABNORMAL HIGH (ref 4.0–10.5)
nRBC: 0 % (ref 0.0–0.2)

## 2019-10-15 LAB — PROCALCITONIN: Procalcitonin: 2.62 ng/mL

## 2019-10-15 LAB — RESPIRATORY PANEL BY RT PCR (FLU A&B, COVID)
Influenza A by PCR: NEGATIVE
Influenza B by PCR: NEGATIVE
SARS Coronavirus 2 by RT PCR: NEGATIVE

## 2019-10-15 LAB — URINALYSIS, ROUTINE W REFLEX MICROSCOPIC
Bilirubin Urine: NEGATIVE
Glucose, UA: NEGATIVE mg/dL
Hgb urine dipstick: NEGATIVE
Ketones, ur: 5 mg/dL — AB
Nitrite: NEGATIVE
Protein, ur: NEGATIVE mg/dL
Specific Gravity, Urine: 1.017 (ref 1.005–1.030)
WBC, UA: >50 WBC/hpf — ABNORMAL HIGH (ref 0–5)
pH: 5 (ref 5.0–8.0)

## 2019-10-15 LAB — PROTIME-INR
INR: 1.1 (ref 0.8–1.2)
Prothrombin Time: 13.8 seconds (ref 11.4–15.2)

## 2019-10-15 LAB — LACTIC ACID, PLASMA
Lactic Acid, Venous: 1.3 mmol/L (ref 0.5–1.9)
Lactic Acid, Venous: 1.6 mmol/L (ref 0.5–1.9)

## 2019-10-15 LAB — HIV ANTIBODY (ROUTINE TESTING W REFLEX): HIV Screen 4th Generation wRfx: NONREACTIVE

## 2019-10-15 LAB — APTT: aPTT: 31 seconds (ref 24–36)

## 2019-10-15 LAB — LIPASE, BLOOD: Lipase: 33 U/L (ref 11–51)

## 2019-10-15 LAB — FIBRINOGEN: Fibrinogen: 382 mg/dL (ref 210–475)

## 2019-10-15 MED ORDER — ONDANSETRON HCL 4 MG PO TABS
4.0000 mg | ORAL_TABLET | Freq: Four times a day (QID) | ORAL | Status: DC | PRN
  Administered 2019-10-16: 10:00:00 4 mg via ORAL
  Filled 2019-10-15: qty 1

## 2019-10-15 MED ORDER — VITAMIN D 25 MCG (1000 UNIT) PO TABS
5000.0000 [IU] | ORAL_TABLET | Freq: Every day | ORAL | Status: DC
  Administered 2019-10-15 – 2019-10-18 (×4): 5000 [IU] via ORAL
  Filled 2019-10-15 (×4): qty 5

## 2019-10-15 MED ORDER — IOHEXOL 9 MG/ML PO SOLN
ORAL | Status: AC
  Filled 2019-10-15: qty 1000

## 2019-10-15 MED ORDER — ATORVASTATIN CALCIUM 40 MG PO TABS
40.0000 mg | ORAL_TABLET | ORAL | Status: DC
  Administered 2019-10-17: 11:00:00 40 mg via ORAL
  Filled 2019-10-15: qty 1

## 2019-10-15 MED ORDER — PROMETHAZINE HCL 25 MG/ML IJ SOLN
12.5000 mg | Freq: Once | INTRAMUSCULAR | Status: AC
  Administered 2019-10-15: 12.5 mg via INTRAVENOUS
  Filled 2019-10-15: qty 1

## 2019-10-15 MED ORDER — LACTATED RINGERS IV SOLN: INTRAVENOUS | Status: DC

## 2019-10-15 MED ORDER — ASPIRIN EC 81 MG PO TBEC
81.0000 mg | DELAYED_RELEASE_TABLET | Freq: Every day | ORAL | Status: DC
  Administered 2019-10-15 – 2019-10-18 (×4): 81 mg via ORAL
  Filled 2019-10-15 (×4): qty 1

## 2019-10-15 MED ORDER — DARIFENACIN HYDROBROMIDE ER 7.5 MG PO TB24
15.0000 mg | ORAL_TABLET | Freq: Every day | ORAL | Status: DC
  Administered 2019-10-16 – 2019-10-18 (×3): 15 mg via ORAL
  Filled 2019-10-15 (×3): qty 2

## 2019-10-15 MED ORDER — FLUTICASONE-UMECLIDIN-VILANT 100-62.5-25 MCG/INH IN AEPB: 100.0000 ug | INHALATION_SPRAY | Freq: Every day | RESPIRATORY_TRACT | Status: DC

## 2019-10-15 MED ORDER — SODIUM CHLORIDE 0.9 % IV SOLN
2.0000 g | INTRAVENOUS | Status: DC
  Administered 2019-10-15 – 2019-10-17 (×3): 2 g via INTRAVENOUS
  Filled 2019-10-15 (×3): qty 20

## 2019-10-15 MED ORDER — UMECLIDINIUM BROMIDE 62.5 MCG/INH IN AEPB
1.0000 | INHALATION_SPRAY | Freq: Every day | RESPIRATORY_TRACT | Status: DC
  Administered 2019-10-15 – 2019-10-18 (×4): 1 via RESPIRATORY_TRACT
  Filled 2019-10-15: qty 7

## 2019-10-15 MED ORDER — BUPROPION HCL ER (XL) 150 MG PO TB24
150.0000 mg | ORAL_TABLET | Freq: Every day | ORAL | Status: DC
  Administered 2019-10-15 – 2019-10-18 (×4): 150 mg via ORAL
  Filled 2019-10-15 (×4): qty 1

## 2019-10-15 MED ORDER — VANCOMYCIN HCL 750 MG/150ML IV SOLN
750.0000 mg | INTRAVENOUS | Status: DC
  Administered 2019-10-15 – 2019-10-17 (×3): 750 mg via INTRAVENOUS
  Filled 2019-10-15 (×2): qty 150

## 2019-10-15 MED ORDER — PANTOPRAZOLE SODIUM 40 MG IV SOLR
40.0000 mg | Freq: Two times a day (BID) | INTRAVENOUS | Status: DC
  Administered 2019-10-15 – 2019-10-18 (×7): 40 mg via INTRAVENOUS
  Filled 2019-10-15 (×7): qty 40

## 2019-10-15 MED ORDER — CIPROFLOXACIN IN D5W 400 MG/200ML IV SOLN
400.0000 mg | Freq: Once | INTRAVENOUS | Status: AC
  Administered 2019-10-15: 400 mg via INTRAVENOUS
  Filled 2019-10-15: qty 200

## 2019-10-15 MED ORDER — FLUTICASONE FUROATE-VILANTEROL 100-25 MCG/INH IN AEPB
1.0000 | INHALATION_SPRAY | Freq: Every day | RESPIRATORY_TRACT | Status: DC
  Administered 2019-10-15 – 2019-10-18 (×4): 1 via RESPIRATORY_TRACT
  Filled 2019-10-15: qty 28

## 2019-10-15 MED ORDER — LACTATED RINGERS IV BOLUS
1000.0000 mL | Freq: Once | INTRAVENOUS | Status: AC
  Administered 2019-10-15: 1000 mL via INTRAVENOUS

## 2019-10-15 MED ORDER — FAMOTIDINE 20 MG PO TABS
20.0000 mg | ORAL_TABLET | Freq: Every day | ORAL | Status: DC
  Administered 2019-10-15 – 2019-10-17 (×3): 20 mg via ORAL
  Filled 2019-10-15 (×3): qty 1

## 2019-10-15 MED ORDER — ACETAMINOPHEN 325 MG PO TABS: 650.0000 mg | ORAL_TABLET | Freq: Four times a day (QID) | ORAL | Status: DC | PRN

## 2019-10-15 MED ORDER — VANCOMYCIN HCL 1750 MG/350ML IV SOLN
1750.0000 mg | Freq: Once | INTRAVENOUS | Status: AC
  Administered 2019-10-15: 1750 mg via INTRAVENOUS
  Filled 2019-10-15: qty 350

## 2019-10-15 MED ORDER — ONDANSETRON HCL 4 MG/2ML IJ SOLN
4.0000 mg | Freq: Four times a day (QID) | INTRAMUSCULAR | Status: DC | PRN
  Administered 2019-10-17: 07:00:00 4 mg via INTRAVENOUS
  Filled 2019-10-15: qty 2

## 2019-10-15 MED ORDER — ACETAMINOPHEN 650 MG RE SUPP: 650.0000 mg | Freq: Four times a day (QID) | RECTAL | Status: DC | PRN

## 2019-10-15 MED ORDER — ALBUTEROL SULFATE (2.5 MG/3ML) 0.083% IN NEBU: 2.5000 mg | INHALATION_SOLUTION | RESPIRATORY_TRACT | Status: DC | PRN

## 2019-10-15 MED ORDER — ACETAMINOPHEN 500 MG PO TABS
1000.0000 mg | ORAL_TABLET | Freq: Once | ORAL | Status: AC
  Administered 2019-10-15: 1000 mg via ORAL
  Filled 2019-10-15: qty 2

## 2019-10-15 MED ORDER — SODIUM CHLORIDE 0.9 % IV SOLN: 2.0000 g | INTRAVENOUS | Status: DC

## 2019-10-15 MED ORDER — ENOXAPARIN SODIUM 40 MG/0.4ML ~~LOC~~ SOLN
40.0000 mg | SUBCUTANEOUS | Status: DC
  Administered 2019-10-15 – 2019-10-16 (×2): 40 mg via SUBCUTANEOUS
  Filled 2019-10-15 (×4): qty 0.4

## 2019-10-15 MED ORDER — FAMOTIDINE 20 MG PO TABS: 40.0000 mg | ORAL_TABLET | Freq: Every day | ORAL | Status: DC

## 2019-10-15 MED ORDER — LACTATED RINGERS IV BOLUS
1000.0000 mL | Freq: Once | INTRAVENOUS | Status: AC
  Administered 2019-10-15: 01:00:00 1000 mL via INTRAVENOUS

## 2019-10-15 MED ORDER — ALBUTEROL SULFATE HFA 108 (90 BASE) MCG/ACT IN AERS: 2.0000 | INHALATION_SPRAY | RESPIRATORY_TRACT | Status: DC | PRN

## 2019-10-15 NOTE — Progress Notes (Addendum)
Pharmacy Antibiotic Note  [name] Park is a 69 y.o. male admitted on 10/14/2019 with sepsis presumably  due to urinary source.  Pharmacy has been consulted for vancomycin  dosing. This patient has CKD stage IIIb.  Plan:  Loading dose:   vancomycin 1.75g  IV x1 dose Maintenance dose:  vancomycin 750mg  IV  q24 h Goal vancomycin trough range: 15-20 mcg/mL Pharmacy will continue to monitor renal function, vancomycin troughs as clinically appropriate,  cultures and patient progress.  Marland Kitchen Height: 5\' 8"  (172.7 cm) Weight: 81.6 kg (180 lb) IBW/kg (Calculated) : 68.4  Temp (24hrs), Avg:100.2 F (37.9 C), Min:99.4 F (37.4 C), Max:101.7 F (38.7 C)  Recent Labs  Lab 10/15/19 0029 10/15/19 0341 10/15/19 0721  WBC 13.2*  --   --   CREATININE 2.13* 1.93*  --   LATICACIDVEN  --   --  1.3    Estimated Creatinine Clearance: 34.5 mL/min (A) (by C-G formula based on SCr of 1.93 mg/dL (H)).    No Known Allergies  Antimicrobials this admission: ciprofloxacin 4/24 >> x1 dose ceftriaxone 4/24 >>  vancomycin 4/24  Dose adjustments this admission: vancomycin   Microbiology results: 4/24 [facility] x2:   4/24 UCx:   4/24 Resp PCR: SARS CoV-2 negative; Flu A/B negative  Thank you for allowing pharmacy to be a part of this patient's care.  Despina Pole 10/15/2019 9:18 AM

## 2019-10-15 NOTE — H&P (Signed)
History and Physical  [name] Park [id number] DOB: [birth year] DOA: 10/14/2019   PCP: Unk Pinto, MD   Patient coming from: Home  Chief Complaint: n/v/d  HPI:  [name] Park is a 69 y.o. male with medical history of COPD, hypertension, CKD stage III, anxiety/depression, hyperlipidemia presenting with nausea, vomiting, and diarrhea that began after eating some takeout food from [facility] on the evening of 10/14/2019.  Approximately 2 hours after eating his carryout food, the patient developed vomiting and diarrhea without any hematemesis, hematochezia, melena.  He had abdominal cramping at that time.  Prior to the events discussed, the patient had been in his usual state of health.  He had denied any recent fevers, chills, headache, coughing, chest pain, shortness of breath, nausea, vomiting, diarrhea, dysuria, hematuria, abdominal pain. The patient denies any new medications.  He denies any recent travels or sick contacts. In the emergency department, the patient was febrile up to 1 to 1.7 F.  He was tachycardic into the 120s with soft blood pressures.  Oxygen saturation was 100% on room air.  Serum creatinine was noted to be 2.13.  Otherwise BMP and LFTs were unremarkable.  Lipase was 33.  WBC 13.2 with hemoglobin 15.7 and platelets 206,000.  CT of the abdomen and pelvis showed a small amount of bilateral perinephric stranding that was similar to prior CTs.  There is no hydronephrosis.  There is no bowel wall thickening.  EKG shows sinus tachycardia with nonspecific T wave changes.  UA shows > 50 WBC.  The patient was given ciprofloxacin.  He was admitted for further evaluation and treatment.  Assessment/Plan: Sepsis -Present on admission -Secondary to urinary source -Follow blood cultures and urine cultures -Unfortunately, blood cultures were not obtained prior to initiating ciprofloxacin -Check procalcitonin -Check lactic acid -Check coags -Start empiric vancomycin and  ceftriaxone pending culture data  Pyuria -Vancomycin and ceftriaxone pending culture data  Acute on chronic renal failure--CKD stage IIIb -Secondary to sepsis and hemodynamic changes -Continue IV fluids  Essential hypertension -Holding bisoprolol HCTZ secondary to soft blood pressure  COPD -Stable on room air -Continue Trelegy  Hyperlipidemia -Continue statin  Nausea and vomiting -Likely secondary to food poisoning/gastritis -Start Protonix  Anxiety/depression -Continue bupropion        Past Medical History:  Diagnosis Date  . Acute appendicitis 11/24/2016  . COPD (chronic obstructive pulmonary disease) ([place])   . GERD (gastroesophageal reflux disease)   . Hematuria   . HLD (hyperlipidemia)   . Hypertension   . IBS (irritable bowel syndrome)   . Pre-diabetes   . Vitamin D deficiency    Past Surgical History:  Procedure Laterality Date  . CARPAL TUNNEL RELEASE Right   . CATARACT EXTRACTION, BILATERAL Bilateral    Dr. Prudencio Burly  . CERVICAL FUSION    . HAND SURGERY Left   . LAPAROSCOPIC APPENDECTOMY N/A 11/24/2016   Procedure: APPENDECTOMY LAPAROSCOPIC;  Surgeon: Clovis Riley, MD;  Location: [facility];  Service: General;  Laterality: N/A;  . TRIGGER FINGER RELEASE Right    thumb   Social History:  reports that he quit smoking about 15 years ago. His smoking use included cigarettes. He has a 52.50 pack-year smoking history. He has never used smokeless tobacco. He reports current alcohol use. He reports that he does not use drugs.   Family History  Problem Relation Age of Onset  . Irritable bowel syndrome Mother   . Diverticulosis Mother   . Lupus Mother   .  COPD Mother   . Hypertension Mother   . Lung cancer Father   . Other Father        bladder polyps  . Stroke Father   . Kidney disease Father   . Cancer Father   . Colon cancer Neg Hx   . Esophageal cancer Neg Hx   . Rectal cancer Neg Hx   . Stomach cancer Neg Hx      No Known Allergies   Prior  to Admission medications   Medication Sig Start Date End Date Taking? Authorizing Provider  albuterol (VENTOLIN HFA) 108 (90 Base) MCG/ACT inhaler Inhale 2 puffs into the lungs every 4 (four) hours as needed for wheezing or shortness of breath. 06/11/17   Liane Comber, NP  aspirin EC 81 MG tablet Take 81 mg by mouth daily. Take 1 tablet (81 mg) by mouth 3 times week - Sunday, Wednesday and Friday - with lunch    [provider]  atorvastatin (LIPITOR) 40 MG tablet Take 1 tablet Daily for Cholesterol 12/27/18   Unk Pinto, MD  bisoprolol-hydrochlorothiazide [facility]) 5-6.25 MG tablet Take 1 Tablet Daily for BP 10/09/18   Unk Pinto, MD  buPROPion (WELLBUTRIN XL) 150 MG 24 hr tablet Take 1 tablet Daily for Mood 02/21/19   Unk Pinto, MD  Cholecalciferol (VITAMIN D-3) 1000 UNITS CAPS Take 5,000 Units by mouth daily.     [provider]  famotidine (PEPCID) 40 MG tablet Take 1 tablet at Bedtime for   Acid Indigestion & Reflux 06/20/19   Unk Pinto, MD  Fluticasone-Umeclidin-Vilant (TRELEGY ELLIPTA) 100-62.5-25 MCG/INH AEPB Inhale 100 mcg into the lungs daily. 07/21/19   Liane Comber, NP  solifenacin (VESICARE) 10 MG tablet Take 1 tablet Daily for Bladder 02/21/19   Unk Pinto, MD    Review of Systems:  Constitutional:  No weight loss, night sweats Head&Eyes: No headache.  No vision loss.  No eye pain or scotoma ENT:  No Difficulty swallowing,Tooth/dental problems,Sore throat,  No ear ache, post nasal drip,  Cardio-vascular:  No chest pain, Orthopnea, PND, swelling in lower extremities,  dizziness, palpitations  GI:  No loss of appetite, hematochezia, melena, heartburn, indigestion, Resp:  No shortness of breath with exertion or at rest. No cough. No coughing up of blood .No wheezing.No chest wall deformity  Skin:  no rash or lesions.  GU:  no dysuria, change in color of urine, no urgency or frequency. No flank pain.  Musculoskeletal:  No joint  pain or swelling. No decreased range of motion. No back pain.  Psych:  No change in mood or affect. No depression or anxiety. Neurologic: No headache, no dysesthesia, no focal weakness, no vision loss. No syncope  Physical Exam: Vitals:   10/15/19 0321 10/15/19 0330 10/15/19 0430 10/15/19 0730  BP:  (!) 106/59 (!) 110/57 (!) 101/57  Pulse:  (!) 113 (!) 110 (!) 109  Resp:  18 18 19   Temp: (!) 101.7 F (38.7 C)     TempSrc: Rectal     SpO2:  100% 99% 97%  Weight:      Height:       General:  A&O x 3, NAD, nontoxic, pleasant/cooperative Head/Eye: No conjunctival hemorrhage, no icterus, [place]/AT, No nystagmus ENT:  No icterus,  No thrush, good dentition, no pharyngeal exudate Neck:  No masses, no lymphadenpathy, no bruits CV:  RRR, no rub, no gallop, no S3 Lung:  CTAB, good air movement, no wheeze, no rhonchi Abdomen: soft/NT, +BS, nondistended, no peritoneal signs Ext: No  cyanosis, No rashes, No petechiae, No lymphangitis, No edema Neuro: CNII-XII intact, strength 4/5 in bilateral upper and lower extremities, no dysmetria  Labs on Admission:  Basic Metabolic Panel: Recent Labs  Lab 10/15/19 0029 10/15/19 0341  NA 134* 136  K 4.2 4.0  CL 99 100  CO2 21* 24  GLUCOSE 152* 116*  BUN 36* 34*  CREATININE 2.13* 1.93*  CALCIUM 9.8 8.9   Liver Function Tests: Recent Labs  Lab 10/15/19 0029  AST 34  ALT 33  ALKPHOS 95  BILITOT 1.2  PROT 8.6*  ALBUMIN 4.8   Recent Labs  Lab 10/15/19 0029  LIPASE 33   No results for input(s): AMMONIA in the last 168 hours. CBC: Recent Labs  Lab 10/15/19 0029  WBC 13.2*  NEUTROABS 12.2*  HGB 15.7  HCT 47.3  MCV 92.6  PLT 266   Coagulation Profile: No results for input(s): INR, PROTIME in the last 168 hours. Cardiac Enzymes: No results for input(s): CKTOTAL, CKMB, CKMBINDEX, TROPONINI in the last 168 hours. BNP: Invalid input(s): POCBNP CBG: No results for input(s): GLUCAP in the last 168 hours. Urine analysis:      Component Value Date/Time   COLORURINE YELLOW 10/15/2019 0327   APPEARANCEUR HAZY (A) 10/15/2019 0327   LABSPEC 1.017 10/15/2019 0327   PHURINE 5.0 10/15/2019 0327   GLUCOSEU NEGATIVE 10/15/2019 0327   HGBUR NEGATIVE 10/15/2019 0327   BILIRUBINUR NEGATIVE 10/15/2019 0327   KETONESUR 5 (A) 10/15/2019 0327   PROTEINUR NEGATIVE 10/15/2019 0327   NITRITE NEGATIVE 10/15/2019 0327   LEUKOCYTESUR TRACE (A) 10/15/2019 0327   Sepsis Labs: @LABRCNTIP (procalcitonin:4,lacticidven:4) )No results found for this or any previous visit (from the past 240 hour(s)).   Radiological Exams on Admission: CT ABDOMEN PELVIS WO CONTRAST  Result Date: 10/15/2019 CLINICAL DATA:  69 year old male with history of nausea and vomiting. EXAM: CT ABDOMEN AND PELVIS WITHOUT CONTRAST TECHNIQUE: Multidetector CT imaging of the abdomen and pelvis was performed following the standard protocol without IV contrast. COMPARISON:  CT the abdomen and pelvis 11/24/2016. FINDINGS: Lower chest: Aortic atherosclerosis. Hepatobiliary: No definite suspicious cystic or solid hepatic lesions are confidently identified on today's noncontrast CT examination. Unenhanced appearance of the gallbladder is normal. Pancreas: No definite pancreatic mass or peripancreatic fluid collections or inflammatory changes are noted on today's noncontrast CT examination. Spleen: Unremarkable. Adrenals/Urinary Tract: Small amount of perinephric stranding bilaterally, nonspecific and similar to the prior examination. Unenhanced appearance of the kidneys and bilateral adrenal glands is otherwise unremarkable. No calculi identified within the collecting system of either kidney, along the course of either ureter, or within the lumen of the urinary bladder. No hydroureteronephrosis. Urinary bladder is normal in appearance. Stomach/Bowel: Unenhanced appearance of the stomach is normal. No pathologic dilatation of small bowel or colon. Status post appendectomy.  Vascular/Lymphatic: Aortic atherosclerosis. No lymphadenopathy noted in the abdomen or pelvis. Reproductive: Prostate gland and seminal vesicles are unremarkable in appearance. Other: Small left inguinal hernia containing only fat. Small umbilical hernia containing only omental fat. No significant volume of ascites. No pneumoperitoneum. Musculoskeletal: There are no aggressive appearing lytic or blastic lesions noted in the visualized portions of the skeleton. IMPRESSION: 1. No acute findings are noted in the abdomen or pelvis to account for the patient's symptoms. 2. Aortic atherosclerosis. 3. Additional incidental findings, as above. Electronically Signed   By: Vinnie Langton M.D.   On: 10/15/2019 06:15    EKG: Independently reviewed. Sinus, nonspecific T wave changes    Time spent:60 minutes Code Status:  FULL Family Communication:  No Family at bedside Disposition Plan: expect 2-3 day hospitalization Consults called: none DVT Prophylaxis: [place] Lovenox  Orson Eva, DO  [facility] Pager [phone]  If 7PM-7AM, please contact night-coverage [email or web address] Password [email or web address] 10/15/2019, 8:00 AM

## 2019-10-15 NOTE — ED Provider Notes (Signed)
Emergency Department Provider Note   I have reviewed the triage vital signs and the nursing notes.   HISTORY  Chief Complaint Emesis   HPI [name] Park is a 69 y.o. male without significant past medical history who presents to the emergency department today for secondary to vomiting and diarrhea.  Patient states that him and his wife ate some takeout food around 1600 1630 range and around 1830 1900 range patient started having some nonbloody nonbilious emesis.  Is happened multiple times in the started have some nonbloody diarrhea as well.  They both resolved at this point.  States he had no real focal abdominal pain just general abdominal discomfort and crampiness associated with the vomiting.  No fevers.  He did have some chills but seem to be associated with the vomiting.  No other associated symptoms.  No sick contacts.  Food did not have a foul odor or appearance.   No other associated or modifying symptoms.    Past Medical History:  Diagnosis Date  . Acute appendicitis 11/24/2016  . COPD (chronic obstructive pulmonary disease) ([place])   . GERD (gastroesophageal reflux disease)   . Hematuria   . HLD (hyperlipidemia)   . Hypertension   . IBS (irritable bowel syndrome)   . Pre-diabetes   . Vitamin D deficiency     Patient Active Problem List   Diagnosis Date Noted  . Medication management 10/06/2018  . Abnormal glucose 10/06/2018  . Pulmonary nodules 07/02/2018  . Chronic kidney disease, stage 3 06/11/2017  . Overweight (BMI 25.0-29.9) 06/10/2017  . Former smoker (54 pack year) 06/10/2017  . Thoracic aorta atherosclerosis ([place]) 08/11/2016  . COPD (chronic obstructive pulmonary disease) with chronic bronchitis ([place]) 09/07/2014  . Generalized anxiety disorder 09/07/2014  . Hyperlipidemia, mixed 06/08/2014  . Gastroesophageal reflux disease 03/08/2014  . OAB (overactive bladder) 03/08/2014  . Diverticulosis 03/08/2014  . Essential hypertension 12/05/2013  . Vitamin D  deficiency 06/21/2013    Past Surgical History:  Procedure Laterality Date  . CARPAL TUNNEL RELEASE Right   . CATARACT EXTRACTION, BILATERAL Bilateral    Dr. Prudencio Burly  . CERVICAL FUSION    . HAND SURGERY Left   . LAPAROSCOPIC APPENDECTOMY N/A 11/24/2016   Procedure: APPENDECTOMY LAPAROSCOPIC;  Surgeon: Clovis Riley, MD;  Location: [facility] OR;  Service: General;  Laterality: N/A;  . TRIGGER FINGER RELEASE Right    thumb    Current Outpatient Rx  . Order #: [id number] Class: Normal  . Order #: [id number] Class: Historical Med  . Order #: [id number] Class: Normal  . Order #: [id number] Class: Normal  . Order #: [id number] Class: Normal  . Order #: [id number] Class: Historical Med  . Order #: [id number] Class: Normal  . Order #: [id number] Class: Sample  . Order #: [id number] Class: Normal    Allergies Patient has no known allergies.  Family History  Problem Relation Age of Onset  . Irritable bowel syndrome Mother   . Diverticulosis Mother   . Lupus Mother   . COPD Mother   . Hypertension Mother   . Lung cancer Father   . Other Father        bladder polyps  . Stroke Father   . Kidney disease Father   . Cancer Father   . Colon cancer Neg Hx   . Esophageal cancer Neg Hx   . Rectal cancer Neg Hx   . Stomach cancer Neg Hx     Social History Social History   Tobacco Use  . Smoking status: Former Smoker  Packs/day: 1.50    Years: 35.00    Pack years: 52.50    Types: Cigarettes    Quit date: 02/05/2004    Years since quitting: 15.7  . Smokeless tobacco: Never Used  Substance Use Topics  . Alcohol use: Yes    Comment: seldom  . Drug use: No    Review of Systems  All other systems negative except as documented in the HPI. All pertinent positives and negatives as reviewed in the HPI. ____________________________________________   PHYSICAL EXAM:  VITAL SIGNS: ED Triage Vitals  Enc Vitals Group     BP 10/14/19 2359 105/72     Pulse Rate 10/14/19 2359 (!) 110     Resp  10/14/19 2359 20     Temp 10/14/19 2359 99.5 F (37.5 C)     Temp Source 10/14/19 2359 Oral     SpO2 10/14/19 2359 99 %     Weight 10/14/19 2358 180 lb (81.6 kg)     Height 10/14/19 2358 5\' 8"  (1.727 m)    Constitutional: Alert and oriented. Well appearing and in no acute distress. Eyes: Conjunctivae are normal. PERRL. EOMI. Head: Atraumatic. Nose: No congestion/rhinnorhea. Mouth/Throat: Mucous membranes are moist.  Oropharynx non-erythematous. Neck: No stridor.  No meningeal signs.   Cardiovascular: tachycardic rate, regular rhythm. Good peripheral circulation. Grossly normal heart sounds.   Respiratory: Normal respiratory effort.  No retractions. Lungs CTAB. Gastrointestinal: Soft and nontender. No distention.  Musculoskeletal: No lower extremity tenderness nor edema. No gross deformities of extremities. Neurologic:  Normal speech and language. No gross focal neurologic deficits are appreciated.  Skin:  Skin is warm, dry and intact. No rash noted.  ____________________________________________   LABS (all labs ordered are listed, but only abnormal results are displayed)  Labs Reviewed  CBC WITH DIFFERENTIAL/PLATELET - Abnormal; Notable for the following components:      Result Value   WBC 13.2 (*)    Neutro Abs 12.2 (*)    Lymphs Abs 0.2 (*)    All other components within normal limits  COMPREHENSIVE METABOLIC PANEL - Abnormal; Notable for the following components:   Sodium 134 (*)    CO2 21 (*)    Glucose, Bld 152 (*)    BUN 36 (*)    Creatinine, Ser 2.13 (*)    Total Protein 8.6 (*)    GFR calc non Af Amer 30 (*)    GFR calc Af Amer 35 (*)    All other components within normal limits  BASIC METABOLIC PANEL - Abnormal; Notable for the following components:   Glucose, Bld 116 (*)    BUN 34 (*)    Creatinine, Ser 1.93 (*)    GFR calc non Af Amer 34 (*)    GFR calc Af Amer 40 (*)    All other components within normal limits  URINALYSIS, ROUTINE W REFLEX MICROSCOPIC -  Abnormal; Notable for the following components:   APPearance HAZY (*)    Ketones, ur 5 (*)    Leukocytes,Ua TRACE (*)    WBC, UA >50 (*)    Bacteria, UA RARE (*)    All other components within normal limits  URINE CULTURE  CULTURE, BLOOD (ROUTINE X 2)  CULTURE, BLOOD (ROUTINE X 2)  LIPASE, BLOOD  LACTIC ACID, PLASMA  LACTIC ACID, PLASMA   ____________________________________________  EKG   EKG Interpretation  Date/Time:    Ventricular Rate:    PR Interval:    QRS Duration:   QT Interval:    QTC  Calculation:   R Axis:     Text Interpretation:         ____________________________________________  RADIOLOGY  CT ABDOMEN PELVIS WO CONTRAST  Result Date: 10/15/2019 CLINICAL DATA:  69 year old male with history of nausea and vomiting. EXAM: CT ABDOMEN AND PELVIS WITHOUT CONTRAST TECHNIQUE: Multidetector CT imaging of the abdomen and pelvis was performed following the standard protocol without IV contrast. COMPARISON:  CT the abdomen and pelvis 11/24/2016. FINDINGS: Lower chest: Aortic atherosclerosis. Hepatobiliary: No definite suspicious cystic or solid hepatic lesions are confidently identified on today's noncontrast CT examination. Unenhanced appearance of the gallbladder is normal. Pancreas: No definite pancreatic mass or peripancreatic fluid collections or inflammatory changes are noted on today's noncontrast CT examination. Spleen: Unremarkable. Adrenals/Urinary Tract: Small amount of perinephric stranding bilaterally, nonspecific and similar to the prior examination. Unenhanced appearance of the kidneys and bilateral adrenal glands is otherwise unremarkable. No calculi identified within the collecting system of either kidney, along the course of either ureter, or within the lumen of the urinary bladder. No hydroureteronephrosis. Urinary bladder is normal in appearance. Stomach/Bowel: Unenhanced appearance of the stomach is normal. No pathologic dilatation of small bowel or  colon. Status post appendectomy. Vascular/Lymphatic: Aortic atherosclerosis. No lymphadenopathy noted in the abdomen or pelvis. Reproductive: Prostate gland and seminal vesicles are unremarkable in appearance. Other: Small left inguinal hernia containing only fat. Small umbilical hernia containing only omental fat. No significant volume of ascites. No pneumoperitoneum. Musculoskeletal: There are no aggressive appearing lytic or blastic lesions noted in the visualized portions of the skeleton. IMPRESSION: 1. No acute findings are noted in the abdomen or pelvis to account for the patient's symptoms. 2. Aortic atherosclerosis. 3. Additional incidental findings, as above. Electronically Signed   By: Vinnie Langton M.D.   On: 10/15/2019 06:15    ____________________________________________   PROCEDURES  Procedure(s) performed:   Procedures   ____________________________________________   INITIAL IMPRESSION / ASSESSMENT AND PLAN / ED COURSE  Gastroenteritis.  Likely viral.  No fevers or focal abdominal tenderness to suggest more emergent cause such as sbo, appendicitis, diverticulitis needing imaging.  We will get basic labs, fluids and antiemetics reevaluate for disposition of likely discharge.  Patient tolerating p.o. but ultimately found to have a fever, persistent tachycardia despite 3 L of fluid, still some nausea.  CT scan was negative for any intra-abdominal pathology however his urine is concerning for infection.  This puts him fully into sepsis category.  Blood cultures drawn.  Ciprofloxacin started for presumptive UTI but would also cover for GI bugs.  Will discuss with hospitalist for observation for improvement.  Pertinent labs & imaging results that were available during my care of the patient were reviewed by me and considered in my medical decision making (see chart for details).  ____________________________________________  FINAL CLINICAL IMPRESSION(S) / ED DIAGNOSES  Final  diagnoses:  Nausea and vomiting, intractability of vomiting not specified, unspecified vomiting type  Urinary tract infection without hematuria, site unspecified     MEDICATIONS GIVEN DURING THIS VISIT:  Medications  iohexol (OMNIPAQUE) 9 MG/ML oral solution (has no administration in time range)  ciprofloxacin (CIPRO) IVPB 400 mg (400 mg Intravenous New Bag/Given 10/15/19 0638)  lactated ringers bolus 1,000 mL (0 mLs Intravenous Stopped 10/15/19 0150)  promethazine (PHENERGAN) injection 12.5 mg (12.5 mg Intravenous Given 10/15/19 0033)  lactated ringers bolus 1,000 mL (0 mLs Intravenous Stopped 10/15/19 0321)  lactated ringers bolus 1,000 mL (0 mLs Intravenous Stopped 10/15/19 0620)  acetaminophen (TYLENOL) tablet 1,000 mg (1,000 mg  Oral Given 10/15/19 0436)     NEW OUTPATIENT MEDICATIONS STARTED DURING THIS VISIT:  New Prescriptions   No medications on file    Note:  This note was prepared with assistance of Dragon voice recognition software. Occasional wrong-word or sound-a-like substitutions may have occurred due to the inherent limitations of voice recognition software.   Sacoya Mcgourty, Corene Cornea, MD 10/15/19 [phone]

## 2019-10-16 DIAGNOSIS — A419 Sepsis, unspecified organism: Secondary | ICD-10-CM | POA: Diagnosis not present

## 2019-10-16 DIAGNOSIS — I10 Essential (primary) hypertension: Secondary | ICD-10-CM | POA: Diagnosis not present

## 2019-10-16 DIAGNOSIS — R112 Nausea with vomiting, unspecified: Secondary | ICD-10-CM | POA: Diagnosis not present

## 2019-10-16 DIAGNOSIS — N179 Acute kidney failure, unspecified: Secondary | ICD-10-CM | POA: Diagnosis not present

## 2019-10-16 LAB — C DIFFICILE QUICK SCREEN W PCR REFLEX
C Diff antigen: NEGATIVE
C Diff interpretation: NOT DETECTED
C Diff toxin: NEGATIVE

## 2019-10-16 LAB — COMPREHENSIVE METABOLIC PANEL
ALT: 25 U/L (ref 0–44)
AST: 26 U/L (ref 15–41)
Albumin: 2.9 g/dL — ABNORMAL LOW (ref 3.5–5.0)
Alkaline Phosphatase: 49 U/L (ref 38–126)
Anion gap: 8 (ref 5–15)
BUN: 21 mg/dL (ref 8–23)
CO2: 21 mmol/L — ABNORMAL LOW (ref 22–32)
Calcium: 7.8 mg/dL — ABNORMAL LOW (ref 8.9–10.3)
Chloride: 107 mmol/L (ref 98–111)
Creatinine, Ser: 1.54 mg/dL — ABNORMAL HIGH (ref 0.61–1.24)
GFR calc Af Amer: 52 mL/min — ABNORMAL LOW (ref >60)
GFR calc non Af Amer: 45 mL/min — ABNORMAL LOW (ref >60)
Glucose, Bld: 93 mg/dL (ref 70–99)
Potassium: 3.1 mmol/L — ABNORMAL LOW (ref 3.5–5.1)
Sodium: 136 mmol/L (ref 135–145)
Total Bilirubin: 0.7 mg/dL (ref 0.3–1.2)
Total Protein: 5.6 g/dL — ABNORMAL LOW (ref 6.5–8.1)

## 2019-10-16 LAB — CBC
HCT: 33.9 % — ABNORMAL LOW (ref 39.0–52.0)
Hemoglobin: 11.2 g/dL — ABNORMAL LOW (ref 13.0–17.0)
MCH: 30.5 pg (ref 26.0–34.0)
MCHC: 33 g/dL (ref 30.0–36.0)
MCV: 92.4 fL (ref 80.0–100.0)
Platelets: 185 10*3/uL (ref 150–400)
RBC: 3.67 MIL/uL — ABNORMAL LOW (ref 4.22–5.81)
RDW: 13.5 % (ref 11.5–15.5)
WBC: 6.2 10*3/uL (ref 4.0–10.5)
nRBC: 0 % (ref 0.0–0.2)

## 2019-10-16 LAB — URINE CULTURE: Culture: NO GROWTH

## 2019-10-16 MED ORDER — POTASSIUM CHLORIDE CRYS ER 20 MEQ PO TBCR
40.0000 meq | EXTENDED_RELEASE_TABLET | Freq: Once | ORAL | Status: AC
  Administered 2019-10-16: 40 meq via ORAL
  Filled 2019-10-16: qty 2

## 2019-10-16 MED ORDER — BISOPROLOL FUMARATE 5 MG PO TABS
2.5000 mg | ORAL_TABLET | Freq: Every day | ORAL | Status: DC
  Administered 2019-10-16 – 2019-10-18 (×3): 2.5 mg via ORAL
  Filled 2019-10-16 (×3): qty 1

## 2019-10-16 NOTE — Progress Notes (Signed)
PROGRESS NOTE  [name] [id number] DOB: 10/11/1950 DOA: 10/14/2019 PCP: Unk Pinto, MD  Brief History:   69 y.o. male with medical history of COPD, hypertension, CKD stage III, anxiety/depression, hyperlipidemia presenting with nausea, vomiting, and diarrhea that began after eating some takeout food from [facility] on the evening of 10/14/2019.  Approximately 2 hours after eating his carryout food, the patient developed vomiting and diarrhea without any hematemesis, hematochezia, melena.  He had abdominal cramping at that time.  Prior to the events discussed, the patient had been in his usual state of health.  He had denied any recent fevers, chills, headache, coughing, chest pain, shortness of breath, nausea, vomiting, diarrhea, dysuria, hematuria, abdominal pain. The patient denies any new medications.  He denies any recent travels or sick contacts. In the emergency department, the patient was febrile up to 1 to 1.7 F.  He was tachycardic into the 120s with soft blood pressures.  Oxygen saturation was 100% on room air.  Serum creatinine was noted to be 2.13.  Otherwise BMP and LFTs were unremarkable.  Lipase was 33.  WBC 13.2 with hemoglobin 15.7 and platelets 206,000.  CT of the abdomen and pelvis showed a small amount of bilateral perinephric stranding that was similar to prior CTs.  There is no hydronephrosis.  There is no bowel wall thickening.  EKG shows sinus tachycardia with nonspecific T wave changes.  UA shows > 50 WBC.  The patient was given ciprofloxacin.  He was admitted for further evaluation and treatment.  Assessment/Plan: Sepsis -Present on admission -Secondary to urinary source and possible GI source -Follow blood cultures and urine cultures -Unfortunately, blood cultures were not obtained prior to initiating ciprofloxacin -Check procalcitonin 2.62 -Check lactic acid--1.3>>>1.6 -Check coags--WNL -Continue empiric vancomycin and ceftriaxone pending  culture data  Pyuria -Vancomycin and ceftriaxone pending culture data  Diarrhea -stool for Cdiff -stool pathogen panel -continues to have 4-5 loose BMs in last 24 hours  Acute on chronic renal failure--CKD stage IIIb -Secondary to sepsis and hemodynamic changes -baseline creatinine 1.3-1.6 -presented with serum creatinine 2.13 -Continue IV fluids  Essential hypertension -Holding bisoprolol HCTZ secondary to soft blood pressure initially -restart low dose bisoprolol  COPD -Stable on room air -Continue Trelegy  Hyperlipidemia -Continue statin  Nausea and vomiting -Likely secondary to food poisoning/gastritis -Continue Protonix  Anxiety/depression -Continue bupropion  Hypokalemia -replete -check mag      Disposition Plan: Patient From: Home/SNF D/C Place: Home/SNF - 1-2  Days Barriers: Not Clinically Stable--continues to have diarrhea, cultures pending and significant electrolyte disturbance  Family Communication:   Spouse updated at bedside 4/25  Consultants:  none  Code Status:  FULL   DVT Prophylaxis: [place] Lovenox   Procedures: As Listed in Progress Note Above  Antibiotics: vanco 4/24>>> Ceftriaxone 4/24>>       Subjective: Patient denies fevers, chills, headache, chest pain, dyspnea,  vomiting,  abdominal pain, dysuria, hematuria, hematochezia, and melena.  Continues to have nausea and diarrhea.   Objective: Vitals:   10/15/19 2200 10/16/19 0424 10/16/19 0726 10/16/19 1259  BP:  (!) 102/51  119/67  Pulse: (!) 107 (!) 104  (!) 110  Resp:  16  16  Temp:  99.6 F (37.6 C)  98.6 F (37 C)  TempSrc:  Oral  Oral  SpO2:  95% 93% 96%  Weight:      Height:        Intake/Output Summary (Last 24 hours) at 10/16/2019  1323 Last data filed at 10/16/2019 0600 Gross per 24 hour  Intake 1881.12 ml  Output --  Net 1881.12 ml   Weight change:  Exam:   General:  Pt is alert, follows commands appropriately, not in acute  distress  HEENT: No icterus, No thrush, No neck mass, [place]/AT  Cardiovascular: RRR, S1/S2, no rubs, no gallops  Respiratory: CTA bilaterally, no wheezing, no crackles, no rhonchi  Abdomen: Soft/+BS, non tender, non distended, no guarding  Extremities: No edema, No lymphangitis, No petechiae, No rashes, no synovitis   Data Reviewed: I have personally reviewed following labs and imaging studies Basic Metabolic Panel: Recent Labs  Lab 10/15/19 0029 10/15/19 0341 10/16/19 0712  NA 134* 136 136  K 4.2 4.0 3.1*  CL 99 100 107  CO2 21* 24 21*  GLUCOSE 152* 116* 93  BUN 36* 34* 21  CREATININE 2.13* 1.93* 1.54*  CALCIUM 9.8 8.9 7.8*   Liver Function Tests: Recent Labs  Lab 10/15/19 0029 10/16/19 0712  AST 34 26  ALT 33 25  ALKPHOS 95 49  BILITOT 1.2 0.7  PROT 8.6* 5.6*  ALBUMIN 4.8 2.9*   Recent Labs  Lab 10/15/19 0029  LIPASE 33   No results for input(s): AMMONIA in the last 168 hours. Coagulation Profile: Recent Labs  Lab 10/15/19 0727  INR 1.1   CBC: Recent Labs  Lab 10/15/19 0029 10/16/19 0712  WBC 13.2* 6.2  NEUTROABS 12.2*  --   HGB 15.7 11.2*  HCT 47.3 33.9*  MCV 92.6 92.4  PLT 266 185   Cardiac Enzymes: No results for input(s): CKTOTAL, CKMB, CKMBINDEX, TROPONINI in the last 168 hours. BNP: Invalid input(s): POCBNP CBG: No results for input(s): GLUCAP in the last 168 hours. HbA1C: No results for input(s): HGBA1C in the last 72 hours. Urine analysis:    Component Value Date/Time   COLORURINE YELLOW 10/15/2019 0327   APPEARANCEUR HAZY (A) 10/15/2019 0327   LABSPEC 1.017 10/15/2019 0327   PHURINE 5.0 10/15/2019 0327   GLUCOSEU NEGATIVE 10/15/2019 0327   HGBUR NEGATIVE 10/15/2019 0327   BILIRUBINUR NEGATIVE 10/15/2019 0327   KETONESUR 5 (A) 10/15/2019 0327   PROTEINUR NEGATIVE 10/15/2019 0327   NITRITE NEGATIVE 10/15/2019 0327   LEUKOCYTESUR TRACE (A) 10/15/2019 0327   Sepsis Labs: @LABRCNTIP (procalcitonin:4,lacticidven:4) ) Recent  Results (from the past 240 hour(s))  Urine culture     Status: None   Collection Time: 10/15/19  6:31 AM   Specimen: Urine, Clean Catch  Result Value Ref Range Status   Specimen Description   Final    URINE, CLEAN CATCH Performed at [facility], [address], [place], [place] [address]    Special Requests   Final    NONE Performed at [facility], [address], [place], [place] [address]    Culture   Final    NO GROWTH Performed at [facility], [place] [address], [place], [place] [address]    Report Status 10/16/2019 FINAL  Final  Respiratory Panel by RT PCR (Flu A&B, Covid) - Nasopharyngeal Swab     Status: None   Collection Time: 10/15/19  7:09 AM   Specimen: Nasopharyngeal Swab  Result Value Ref Range Status   SARS Coronavirus 2 by RT PCR NEGATIVE NEGATIVE Final    Comment: (NOTE) SARS-CoV-2 target nucleic acids are NOT DETECTED. The SARS-CoV-2 RNA is generally detectable in upper respiratoy specimens during the acute phase of infection. The lowest concentration of SARS-CoV-2 viral copies this assay can detect is 131 copies/mL.  A negative result does not preclude SARS-Cov-2 infection and should not be used as the sole basis for treatment or other patient management decisions. A negative result may occur with  improper specimen collection/handling, submission of specimen other than nasopharyngeal swab, presence of viral mutation(s) within the areas targeted by this assay, and inadequate number of viral copies (<131 copies/mL). A negative result must be combined with clinical observations, patient history, and epidemiological information. The expected result is Negative. Fact Sheet for Patients:  PinkCheek.be Fact Sheet for Healthcare Providers:  GravelBags.it This test is not yet ap proved or cleared by the [place] FDA and  has been authorized for detection and/or diagnosis of SARS-CoV-2 by FDA under an  Emergency Use Authorization (EUA). This EUA will remain  in effect (meaning this test can be used) for the duration of the COVID-19 declaration under Section 564(b)(1) of the Act, 21 U.S.C. section 360bbb-3(b)(1), unless the authorization is terminated or revoked sooner.    Influenza A by PCR NEGATIVE NEGATIVE Final   Influenza B by PCR NEGATIVE NEGATIVE Final    Comment: (NOTE) The Xpert Xpress SARS-CoV-2/FLU/RSV assay is intended as an aid in  the diagnosis of influenza from Nasopharyngeal swab specimens and  should not be used as a sole basis for treatment. Nasal washings and  aspirates are unacceptable for Xpert Xpress SARS-CoV-2/FLU/RSV  testing. Fact Sheet for Patients: PinkCheek.be Fact Sheet for Healthcare Providers: GravelBags.it This test is not yet approved or cleared by the [place] FDA and  has been authorized for detection and/or diagnosis of SARS-CoV-2 by  FDA under an Emergency Use Authorization (EUA). This EUA will remain  in effect (meaning this test can be used) for the duration of the  Covid-19 declaration under Section 564(b)(1) of the Act, 21  U.S.C. section 360bbb-3(b)(1), unless the authorization is  terminated or revoked. Performed at [facility], [address] [place], [place] [address]   Blood culture (routine x 2)     Status: None (Preliminary result)   Collection Time: 10/15/19  7:21 AM   Specimen: BLOOD LEFT HAND  Result Value Ref Range Status   Specimen Description   Final    BLOOD LEFT HAND BOTTLES DRAWN AEROBIC AND ANAEROBIC   Special Requests Blood Culture adequate volume  Final   Culture   Final    NO GROWTH 1 DAY Performed at [facility], [address] [place], [place] [address]    Report Status PENDING  Incomplete  Blood culture (routine x 2)     Status: None (Preliminary result)   Collection Time: 10/15/19  7:27 AM   Specimen: BLOOD RIGHT HAND  Result Value Ref Range  Status   Specimen Description   Final    BLOOD RIGHT HAND BOTTLES DRAWN AEROBIC AND ANAEROBIC   Special Requests Blood Culture adequate volume  Final   Culture   Final    NO GROWTH 1 DAY Performed at [facility], [address], [place], [place] [address]    Report Status PENDING  Incomplete     Scheduled Meds: . aspirin EC  81 mg Oral Daily  . [START ON 10/17/2019] atorvastatin  40 mg Oral Q M,W,F  . buPROPion  150 mg Oral Daily  . cholecalciferol  5,000 Units Oral Daily  . darifenacin  15 mg Oral Daily  . enoxaparin (LOVENOX) injection  40 mg Subcutaneous Q24H  . famotidine  20 mg Oral QHS  . fluticasone furoate-vilanterol  1 puff Inhalation Daily   And  .  umeclidinium bromide  1 puff Inhalation Daily  . pantoprazole (PROTONIX) IV  40 mg Intravenous Q12H   Continuous Infusions: . cefTRIAXone (ROCEPHIN)  IV 2 g (10/16/19 1030)  . lactated ringers 100 mL/hr at 10/15/19 2201  . vancomycin 750 mg (10/16/19 1210)    Procedures/Studies: CT ABDOMEN PELVIS WO CONTRAST  Result Date: 10/15/2019 CLINICAL DATA:  69 year old male with history of nausea and vomiting. EXAM: CT ABDOMEN AND PELVIS WITHOUT CONTRAST TECHNIQUE: Multidetector CT imaging of the abdomen and pelvis was performed following the standard protocol without IV contrast. COMPARISON:  CT the abdomen and pelvis 11/24/2016. FINDINGS: Lower chest: Aortic atherosclerosis. Hepatobiliary: No definite suspicious cystic or solid hepatic lesions are confidently identified on today's noncontrast CT examination. Unenhanced appearance of the gallbladder is normal. Pancreas: No definite pancreatic mass or peripancreatic fluid collections or inflammatory changes are noted on today's noncontrast CT examination. Spleen: Unremarkable. Adrenals/Urinary Tract: Small amount of perinephric stranding bilaterally, nonspecific and similar to the prior examination. Unenhanced appearance of the kidneys and bilateral adrenal glands is otherwise  unremarkable. No calculi identified within the collecting system of either kidney, along the course of either ureter, or within the lumen of the urinary bladder. No hydroureteronephrosis. Urinary bladder is normal in appearance. Stomach/Bowel: Unenhanced appearance of the stomach is normal. No pathologic dilatation of small bowel or colon. Status post appendectomy. Vascular/Lymphatic: Aortic atherosclerosis. No lymphadenopathy noted in the abdomen or pelvis. Reproductive: Prostate gland and seminal vesicles are unremarkable in appearance. Other: Small left inguinal hernia containing only fat. Small umbilical hernia containing only omental fat. No significant volume of ascites. No pneumoperitoneum. Musculoskeletal: There are no aggressive appearing lytic or blastic lesions noted in the visualized portions of the skeleton. IMPRESSION: 1. No acute findings are noted in the abdomen or pelvis to account for the patient's symptoms. 2. Aortic atherosclerosis. 3. Additional incidental findings, as above. Electronically Signed   By: Vinnie Langton M.D.   On: 10/15/2019 06:15    Orson Eva, DO  [facility]  If 7PM-7AM, please contact night-coverage [email or web address] Password [email or web address] 10/16/2019, 1:23 PM   LOS: 1 day

## 2019-10-16 NOTE — Progress Notes (Signed)
Patient has had 4 liquid bowel movements this shift.

## 2019-10-17 DIAGNOSIS — A419 Sepsis, unspecified organism: Secondary | ICD-10-CM | POA: Diagnosis not present

## 2019-10-17 DIAGNOSIS — N179 Acute kidney failure, unspecified: Secondary | ICD-10-CM | POA: Diagnosis not present

## 2019-10-17 DIAGNOSIS — R112 Nausea with vomiting, unspecified: Secondary | ICD-10-CM | POA: Diagnosis not present

## 2019-10-17 DIAGNOSIS — I10 Essential (primary) hypertension: Secondary | ICD-10-CM | POA: Diagnosis not present

## 2019-10-17 LAB — BASIC METABOLIC PANEL
Anion gap: 8 (ref 5–15)
BUN: 13 mg/dL (ref 8–23)
CO2: 21 mmol/L — ABNORMAL LOW (ref 22–32)
Calcium: 7.9 mg/dL — ABNORMAL LOW (ref 8.9–10.3)
Chloride: 110 mmol/L (ref 98–111)
Creatinine, Ser: 1.39 mg/dL — ABNORMAL HIGH (ref 0.61–1.24)
GFR calc Af Amer: 59 mL/min — ABNORMAL LOW (ref >60)
GFR calc non Af Amer: 51 mL/min — ABNORMAL LOW (ref >60)
Glucose, Bld: 84 mg/dL (ref 70–99)
Potassium: 3.2 mmol/L — ABNORMAL LOW (ref 3.5–5.1)
Sodium: 139 mmol/L (ref 135–145)

## 2019-10-17 LAB — GASTROINTESTINAL PANEL BY PCR, STOOL (REPLACES STOOL CULTURE)

## 2019-10-17 LAB — MAGNESIUM: Magnesium: 1.8 mg/dL (ref 1.7–2.4)

## 2019-10-17 LAB — CBC
HCT: 33.1 % — ABNORMAL LOW (ref 39.0–52.0)
Hemoglobin: 11 g/dL — ABNORMAL LOW (ref 13.0–17.0)
MCH: 30.9 pg (ref 26.0–34.0)
MCHC: 33.2 g/dL (ref 30.0–36.0)
MCV: 93 fL (ref 80.0–100.0)
Platelets: 184 10*3/uL (ref 150–400)
RBC: 3.56 MIL/uL — ABNORMAL LOW (ref 4.22–5.81)
RDW: 13.4 % (ref 11.5–15.5)
WBC: 5.4 10*3/uL (ref 4.0–10.5)
nRBC: 0 % (ref 0.0–0.2)

## 2019-10-17 MED ORDER — POTASSIUM CHLORIDE IN NACL 20-0.9 MEQ/L-% IV SOLN: INTRAVENOUS | Status: DC

## 2019-10-17 MED ORDER — POTASSIUM CHLORIDE CRYS ER 20 MEQ PO TBCR
40.0000 meq | EXTENDED_RELEASE_TABLET | Freq: Once | ORAL | Status: AC
  Administered 2019-10-17: 40 meq via ORAL
  Filled 2019-10-17: qty 2

## 2019-10-17 MED ORDER — LOPERAMIDE HCL 2 MG PO CAPS: 2.0000 mg | ORAL_CAPSULE | ORAL | 0 refills | Status: DC | PRN

## 2019-10-17 MED ORDER — MAGNESIUM SULFATE 2 GM/50ML IV SOLN
2.0000 g | Freq: Once | INTRAVENOUS | Status: AC
  Administered 2019-10-17: 17:00:00 2 g via INTRAVENOUS
  Filled 2019-10-17: qty 50

## 2019-10-17 MED ORDER — LOPERAMIDE HCL 2 MG PO CAPS
2.0000 mg | ORAL_CAPSULE | ORAL | Status: DC | PRN
  Administered 2019-10-17 – 2019-10-18 (×2): 2 mg via ORAL
  Filled 2019-10-17 (×2): qty 1

## 2019-10-17 NOTE — Progress Notes (Signed)
Patient has had diarrhea tonight.  Patient has had five loose tonight.  Patient states after he eats or drinks he feels stomach growling and soon after has to have a bowel movement.  Frequency of stools has slightly increased and patient voices concern about bowel movements.

## 2019-10-17 NOTE — Discharge Summary (Addendum)
Physician Discharge Summary  [name] Park [id number] DOB: 01-26-1951 DOA: 10/14/2019  PCP: Unk Pinto, MD  Admit date: 10/14/2019 Discharge date: 10/18/2019  Admitted From: Home Disposition:  Home   Recommendations for Outpatient Follow-up:  1. Follow up with PCP in 1-2 weeks 2. Please obtain BMP/CBC in one week     Discharge Condition: Stable CODE STATUS: FULL Diet recommendation: Heart Healthy    Brief/Interim Summary: 69 y.o.malewith medical history ofCOPD, hypertension, CKD stage III, anxiety/depression, hyperlipidemia presenting with nausea, vomiting, and diarrhea that began after eating some takeout food from [facility] on the evening of 10/14/2019. Approximately 2 hours after eating his carryout food, the patient developed vomiting and diarrhea without any hematemesis, hematochezia, melena. He had abdominal cramping at that time. Prior to the events discussed, the patient had been in his usual state of health. He had denied any recent fevers, chills, headache, coughing, chest pain, shortness of breath, nausea, vomiting, diarrhea, dysuria, hematuria, abdominal pain. The patient denies any new medications. He denies any recent travels or sick contacts. In the emergency department, the patient was febrile up to 101.7 F. He was tachycardic into the 120s with soft blood pressures. Oxygen saturation was 100%on room air. Serum creatinine was noted to be 2.13. Otherwise BMP and LFTs were unremarkable. Lipase was 33. WBC 13.2 with hemoglobin 15.7 and platelets 206,000. CT of the abdomen and pelvis showed a small amount of bilateral perinephric stranding that was similar to prior CTs. There is no hydronephrosis. There is no bowel wall thickening. EKG shows sinus tachycardia with nonspecific T wave changes. UA shows >50 WBC. The patient was given ciprofloxacin. He was admitted for further evaluation and treatment.  Discharge Diagnoses:  Sepsis -Present on  admission -Secondary to GI source--enteritis from Norovirus -Follow blood cultures and urine cultures -Unfortunately, blood cultures were not obtained prior to initiating ciprofloxacin -Check procalcitonin2.62 -Check lactic acid--1.3>>>1.6 -Check coags--WNL -discontinue vancomycin; discontinue ceftriaxone  Pyuria -urine culture neg -d/c vanco -discontinue ceftriaxone -remained stable off antibiotic  Diarrhea--Norovirus enteritis -stool for Cdiff--neg -stool pathogen panel-->positive Norovirus -continues to have 4-5 loose BMs in last 24 hours -started loperamide--now down to 2-3 loose BMs per day  Acute on chronic renal failure--CKD stage IIIb -Secondary to sepsis and hemodynamic changes -baseline creatinine 1.3-1.6 -presented with serum creatinine 2.13 -Continue IV fluids -serum creatinine 1.41 at time of dc  Essential hypertension -Holding bisoprolol HCTZ secondary to soft blood pressureinitially -restarted low dose bisoprolol-->bp well controlled -follow PCP for BP check  COPD -Stable on room air -Continue Trelegy  Hyperlipidemia -Continue statin  Nausea and vomiting -Likely secondary to food poisoning/gastritis -ContinueProtonix -placed on bowel rest initially -diet advanced which he tolerated  Anxiety/depression -Continue bupropion  Hypokalemia -replete  -check mag 2.2 -add KCl to IVF  Discharge Instructions   Allergies as of 10/18/2019   No Known Allergies     Medication List    STOP taking these medications   bisoprolol-hydrochlorothiazide 5-6.25 MG tablet Commonly known as: ZIAC   Trelegy Ellipta 100-62.5-25 MCG/INH Aepb Generic drug: Fluticasone-Umeclidin-Vilant     TAKE these medications   aspirin EC 81 MG tablet Take 81 mg by mouth See admin instructions. Take 1 tablet (81 mg) by mouth 3 times week - Sunday, Wednesday and Friday - with lunch   atorvastatin 40 MG tablet Commonly known as: LIPITOR Take 1 tablet Daily for  Cholesterol   bisoprolol 5 MG tablet Commonly known as: ZEBETA Take 0.5 tablets (2.5 mg total) by mouth daily. Start taking on: October 19, 2019   buPROPion 150 MG 24 hr tablet Commonly known as: WELLBUTRIN XL Take 1 tablet Daily for Mood What changed:   how much to take  how to take this  when to take this   famotidine 40 MG tablet Commonly known as: PEPCID Take 1 tablet at Bedtime for   Acid Indigestion & Reflux   loperamide 2 MG capsule Commonly known as: IMODIUM Take 1 capsule (2 mg total) by mouth as needed for diarrhea or loose stools.   solifenacin 10 MG tablet Commonly known as: VESICARE Take 1 tablet Daily for Bladder What changed:   how much to take  how to take this  when to take this   Vitamin D-3 25 MCG (1000 UT) Caps Take 5,000 Units by mouth daily.       No Known Allergies  Consultations:  none   Procedures/Studies: CT ABDOMEN PELVIS WO CONTRAST  Result Date: 10/15/2019 CLINICAL DATA:  69 year old male with history of nausea and vomiting. EXAM: CT ABDOMEN AND PELVIS WITHOUT CONTRAST TECHNIQUE: Multidetector CT imaging of the abdomen and pelvis was performed following the standard protocol without IV contrast. COMPARISON:  CT the abdomen and pelvis 11/24/2016. FINDINGS: Lower chest: Aortic atherosclerosis. Hepatobiliary: No definite suspicious cystic or solid hepatic lesions are confidently identified on today's noncontrast CT examination. Unenhanced appearance of the gallbladder is normal. Pancreas: No definite pancreatic mass or peripancreatic fluid collections or inflammatory changes are noted on today's noncontrast CT examination. Spleen: Unremarkable. Adrenals/Urinary Tract: Small amount of perinephric stranding bilaterally, nonspecific and similar to the prior examination. Unenhanced appearance of the kidneys and bilateral adrenal glands is otherwise unremarkable. No calculi identified within the collecting system of either kidney, along the  course of either ureter, or within the lumen of the urinary bladder. No hydroureteronephrosis. Urinary bladder is normal in appearance. Stomach/Bowel: Unenhanced appearance of the stomach is normal. No pathologic dilatation of small bowel or colon. Status post appendectomy. Vascular/Lymphatic: Aortic atherosclerosis. No lymphadenopathy noted in the abdomen or pelvis. Reproductive: Prostate gland and seminal vesicles are unremarkable in appearance. Other: Small left inguinal hernia containing only fat. Small umbilical hernia containing only omental fat. No significant volume of ascites. No pneumoperitoneum. Musculoskeletal: There are no aggressive appearing lytic or blastic lesions noted in the visualized portions of the skeleton. IMPRESSION: 1. No acute findings are noted in the abdomen or pelvis to account for the patient's symptoms. 2. Aortic atherosclerosis. 3. Additional incidental findings, as above. Electronically Signed   By: Vinnie Langton M.D.   On: 10/15/2019 06:15        Discharge Exam: Vitals:   10/18/19 0614 10/18/19 0750  BP: 119/71   Pulse: 80   Resp: 20   Temp: 99.1 F (37.3 C)   SpO2: 98% 98%   Vitals:   10/17/19 1951 10/17/19 2105 10/18/19 0614 10/18/19 0750  BP:  117/61 119/71   Pulse:  83 80   Resp:  18 20   Temp:  98.5 F (36.9 C) 99.1 F (37.3 C)   TempSrc:  Oral Oral   SpO2: 97% 98% 98% 98%  Weight:      Height:        General: Pt is alert, awake, not in acute distress Cardiovascular: RRR, S1/S2 +, no rubs, no gallops Respiratory: CTA bilaterally, no wheezing, no rhonchi Abdominal: Soft, NT, ND, bowel sounds + Extremities: no edema, no cyanosis   The results of significant diagnostics from this hospitalization (including imaging, microbiology, ancillary and laboratory) are listed below for  reference.    Significant Diagnostic Studies: CT ABDOMEN PELVIS WO CONTRAST  Result Date: 10/15/2019 CLINICAL DATA:  69 year old male with history of nausea and  vomiting. EXAM: CT ABDOMEN AND PELVIS WITHOUT CONTRAST TECHNIQUE: Multidetector CT imaging of the abdomen and pelvis was performed following the standard protocol without IV contrast. COMPARISON:  CT the abdomen and pelvis 11/24/2016. FINDINGS: Lower chest: Aortic atherosclerosis. Hepatobiliary: No definite suspicious cystic or solid hepatic lesions are confidently identified on today's noncontrast CT examination. Unenhanced appearance of the gallbladder is normal. Pancreas: No definite pancreatic mass or peripancreatic fluid collections or inflammatory changes are noted on today's noncontrast CT examination. Spleen: Unremarkable. Adrenals/Urinary Tract: Small amount of perinephric stranding bilaterally, nonspecific and similar to the prior examination. Unenhanced appearance of the kidneys and bilateral adrenal glands is otherwise unremarkable. No calculi identified within the collecting system of either kidney, along the course of either ureter, or within the lumen of the urinary bladder. No hydroureteronephrosis. Urinary bladder is normal in appearance. Stomach/Bowel: Unenhanced appearance of the stomach is normal. No pathologic dilatation of small bowel or colon. Status post appendectomy. Vascular/Lymphatic: Aortic atherosclerosis. No lymphadenopathy noted in the abdomen or pelvis. Reproductive: Prostate gland and seminal vesicles are unremarkable in appearance. Other: Small left inguinal hernia containing only fat. Small umbilical hernia containing only omental fat. No significant volume of ascites. No pneumoperitoneum. Musculoskeletal: There are no aggressive appearing lytic or blastic lesions noted in the visualized portions of the skeleton. IMPRESSION: 1. No acute findings are noted in the abdomen or pelvis to account for the patient's symptoms. 2. Aortic atherosclerosis. 3. Additional incidental findings, as above. Electronically Signed   By: Vinnie Langton M.D.   On: 10/15/2019 06:15      Microbiology: Recent Results (from the past 240 hour(s))  Urine culture     Status: None   Collection Time: 10/15/19  6:31 AM   Specimen: Urine, Clean Catch  Result Value Ref Range Status   Specimen Description   Final    URINE, CLEAN CATCH Performed at [facility], [address] [place], [place] [address]    Special Requests   Final    NONE Performed at [facility], [address], [place], [place] [address]    Culture   Final    NO GROWTH Performed at [facility], [address] [place], [place] [address]    Report Status 10/16/2019 FINAL  Final  Respiratory Panel by RT PCR (Flu A&B, Covid) - Nasopharyngeal Swab     Status: None   Collection Time: 10/15/19  7:09 AM   Specimen: Nasopharyngeal Swab  Result Value Ref Range Status   SARS Coronavirus 2 by RT PCR NEGATIVE NEGATIVE Final    Comment: (NOTE) SARS-CoV-2 target nucleic acids are NOT DETECTED. The SARS-CoV-2 RNA is generally detectable in upper respiratoy specimens during the acute phase of infection. The lowest concentration of SARS-CoV-2 viral copies this assay can detect is 131 copies/mL. A negative result does not preclude SARS-Cov-2 infection and should not be used as the sole basis for treatment or other patient management decisions. A negative result may occur with  improper specimen collection/handling, submission of specimen other than nasopharyngeal swab, presence of viral mutation(s) within the areas targeted by this assay, and inadequate number of viral copies (<131 copies/mL). A negative result must be combined with clinical observations, patient history, and epidemiological information. The expected result is Negative. Fact Sheet for Patients:  PinkCheek.be Fact Sheet for Healthcare Providers:  GravelBags.it This test is not  yet ap proved or cleared by the [place] and  has been authorized for detection and/or diagnosis  of SARS-CoV-2 by FDA under an Emergency Use Authorization (EUA). This EUA will remain  in effect (meaning this test can be used) for the duration of the COVID-19 declaration under Section 564(b)(1) of the Act, 21 U.S.C. section 360bbb-3(b)(1), unless the authorization is terminated or revoked sooner.    Influenza A by PCR NEGATIVE NEGATIVE Final   Influenza B by PCR NEGATIVE NEGATIVE Final    Comment: (NOTE) The Xpert Xpress SARS-CoV-2/FLU/RSV assay is intended as an aid in  the diagnosis of influenza from Nasopharyngeal swab specimens and  should not be used as a sole basis for treatment. Nasal washings and  aspirates are unacceptable for Xpert Xpress SARS-CoV-2/FLU/RSV  testing. Fact Sheet for Patients: PinkCheek.be Fact Sheet for Healthcare Providers: GravelBags.it This test is not yet approved or cleared by the [place] FDA and  has been authorized for detection and/or diagnosis of SARS-CoV-2 by  FDA under an Emergency Use Authorization (EUA). This EUA will remain  in effect (meaning this test can be used) for the duration of the  Covid-19 declaration under Section 564(b)(1) of the Act, 21  U.S.C. section 360bbb-3(b)(1), unless the authorization is  terminated or revoked. Performed at [facility], [address] [place], [place] [address]   Blood culture (routine x 2)     Status: None (Preliminary result)   Collection Time: 10/15/19  7:21 AM   Specimen: BLOOD LEFT HAND  Result Value Ref Range Status   Specimen Description   Final    BLOOD LEFT HAND BOTTLES DRAWN AEROBIC AND ANAEROBIC   Special Requests Blood Culture adequate volume  Final   Culture   Final    NO GROWTH 3 DAYS Performed at [facility], [address], [place], [place] [address]    Report Status PENDING  Incomplete  Blood culture (routine x 2)     Status: None (Preliminary result)   Collection Time: 10/15/19  7:27 AM   Specimen: BLOOD RIGHT  HAND  Result Value Ref Range Status   Specimen Description   Final    BLOOD RIGHT HAND BOTTLES DRAWN AEROBIC AND ANAEROBIC   Special Requests Blood Culture adequate volume  Final   Culture   Final    NO GROWTH 3 DAYS Performed at [facility], [address] [place], [place] [address]    Report Status PENDING  Incomplete  C Difficile Quick Screen w PCR reflex     Status: None   Collection Time: 10/16/19  1:21 PM   Specimen: Stool  Result Value Ref Range Status   C Diff antigen NEGATIVE NEGATIVE Final   C Diff toxin NEGATIVE NEGATIVE Final   C Diff interpretation No C. difficile detected.  Final    Comment: Performed at [facility], [address], [place], [place] [address]  Gastrointestinal Panel by PCR , Stool     Status: Abnormal   Collection Time: 10/16/19  1:22 PM   Specimen: Stool  Result Value Ref Range Status   Campylobacter species NOT DETECTED NOT DETECTED Final   Plesimonas shigelloides NOT DETECTED NOT DETECTED Final   Salmonella species NOT DETECTED NOT DETECTED Final   Yersinia enterocolitica NOT DETECTED NOT DETECTED Final   Vibrio species NOT DETECTED NOT DETECTED Final   Vibrio cholerae NOT DETECTED NOT DETECTED Final   Enteroaggregative E coli (EAEC) NOT DETECTED NOT DETECTED Final   Enteropathogenic E coli (EPEC) NOT DETECTED  NOT DETECTED Final   Enterotoxigenic E coli (ETEC) NOT DETECTED NOT DETECTED Final   Shiga like toxin producing E coli (STEC) NOT DETECTED NOT DETECTED Final   Shigella/Enteroinvasive E coli (EIEC) NOT DETECTED NOT DETECTED Final   Cryptosporidium NOT DETECTED NOT DETECTED Final   Cyclospora cayetanensis NOT DETECTED NOT DETECTED Final   Entamoeba histolytica NOT DETECTED NOT DETECTED Final   Giardia lamblia NOT DETECTED NOT DETECTED Final   Adenovirus F40/41 NOT DETECTED NOT DETECTED Final   Astrovirus NOT DETECTED NOT DETECTED Final   Norovirus GI/GII DETECTED (A) NOT DETECTED Final    Comment: RESULT CALLED TO, READ BACK BY AND  VERIFIED WITH: LEANNE PARSONS AT 1554 10/17/19 SDR    Rotavirus A NOT DETECTED NOT DETECTED Final   Sapovirus (I, II, IV, and V) NOT DETECTED NOT DETECTED Final    Comment: Performed at [facility], [address] [place], [place] [address]     Labs: Basic Metabolic Panel: Recent Labs  Lab 10/15/19 0029 10/15/19 0029 10/15/19 0341 10/15/19 0341 10/16/19 0712 10/16/19 0712 10/17/19 0447 10/18/19 0430  NA 134*  --  136  --  136  --  139 140  K 4.2   < > 4.0   < > 3.1*   < > 3.2* 3.4*  CL 99  --  100  --  107  --  110 111  CO2 21*  --  24  --  21*  --  21* 21*  GLUCOSE 152*  --  116*  --  93  --  84 84  BUN 36*  --  34*  --  21  --  13 11  CREATININE 2.13*  --  1.93*  --  1.54*  --  1.39* 1.41*  CALCIUM 9.8  --  8.9  --  7.8*  --  7.9* 8.0*  MG  --   --   --   --   --   --  1.8 2.2   < > = values in this interval not displayed.   Liver Function Tests: Recent Labs  Lab 10/15/19 0029 10/16/19 0712  AST 34 26  ALT 33 25  ALKPHOS 95 49  BILITOT 1.2 0.7  PROT 8.6* 5.6*  ALBUMIN 4.8 2.9*   Recent Labs  Lab 10/15/19 0029  LIPASE 33   No results for input(s): AMMONIA in the last 168 hours. CBC: Recent Labs  Lab 10/15/19 0029 10/16/19 0712 10/17/19 0447  WBC 13.2* 6.2 5.4  NEUTROABS 12.2*  --   --   HGB 15.7 11.2* 11.0*  HCT 47.3 33.9* 33.1*  MCV 92.6 92.4 93.0  PLT 266 185 184   Cardiac Enzymes: No results for input(s): CKTOTAL, CKMB, CKMBINDEX, TROPONINI in the last 168 hours. BNP: Invalid input(s): POCBNP CBG: No results for input(s): GLUCAP in the last 168 hours.  Time coordinating discharge:  36 minutes  Signed:  Orson Eva, DO [facility] Pager: [phone] 10/18/2019, 12:13 PM

## 2019-10-17 NOTE — Progress Notes (Signed)
PROGRESS NOTE  [name] Park [id number] DOB: 1951/04/15 DOA: 10/14/2019 PCP: Unk Pinto, MD  Brief History:  68 y.o.malewith medical history ofCOPD, hypertension, CKD stage III, anxiety/depression, hyperlipidemia presenting with nausea, vomiting, and diarrhea that began after eating some takeout food from [facility] on the evening of 10/14/2019. Approximately 2 hours after eating his carryout food, the patient developed vomiting and diarrhea without any hematemesis, hematochezia, melena. He had abdominal cramping at that time. Prior to the events discussed, the patient had been in his usual state of health. He had denied any recent fevers, chills, headache, coughing, chest pain, shortness of breath, nausea, vomiting, diarrhea, dysuria, hematuria, abdominal pain. The patient denies any new medications. He denies any recent travels or sick contacts. In the emergency department, the patient was febrile up to 101.7 F. He was tachycardic into the 120s with soft blood pressures. Oxygen saturation was 100%on room air. Serum creatinine was noted to be 2.13. Otherwise BMP and LFTs were unremarkable. Lipase was 33. WBC 13.2 with hemoglobin 15.7 and platelets 206,000. CT of the abdomen and pelvis showed a small amount of bilateral perinephric stranding that was similar to prior CTs. There is no hydronephrosis. There is no bowel wall thickening. EKG shows sinus tachycardia with nonspecific T wave changes. UA shows >50 WBC. The patient was given ciprofloxacin. He was admitted for further evaluation and treatment.  Assessment/Plan: Sepsis -Present on admission -Secondary to urinary source and possible GI source -Follow blood cultures and urine cultures -Unfortunately, blood cultures were not obtained prior to initiating ciprofloxacin -Check procalcitonin 2.62 -Check lactic acid--1.3>>>1.6 -Check coags--WNL -discontinue vancomycin; continue  ceftriaxone  Pyuria -urine culture neg -d/c vanco -continue ceftriaxone  Diarrhea -stool for Cdiff--neg -stool pathogen panel--pending -continues to have 4-5 loose BMs in last 24 hours -start loperamide  Acute on chronic renal failure--CKD stage IIIb -Secondary to sepsis and hemodynamic changes -baseline creatinine 1.3-1.6 -presented with serum creatinine 2.13 -Continue IV fluids  Essential hypertension -Holding bisoprolol HCTZ secondary to soft blood pressure initially -restarted low dose bisoprolol  COPD -Stable on room air -Continue Trelegy  Hyperlipidemia -Continue statin  Nausea and vomiting -Likely secondary to food poisoning/gastritis -Continue Protonix  Anxiety/depression -Continue bupropion  Hypokalemia -replete -check mag 1.8 -add KCl to IVF      Disposition Plan: Patient From: Home D/C Place: Home - 4/27 if diarrhea improves Barriers: Not Clinically Stable--continues to have diarrhea, cultures pending and significant electrolyte disturbance   Family Communication:   Spouse updated at bedside 4/25  Consultants:  none  Code Status:  FULL   DVT Prophylaxis:  Lovenox   Procedures: As Listed in Progress Note Above  Antibiotics: vanco 4/24>>>4/26 Ceftriaxone 4/24>>      Subjective: Diarrhea slowing down but still had 4 loose bm's without blood.  Denies n/v, cp, sob, f/c, headache, rash, abd pain  Objective: Vitals:   10/16/19 1259 10/16/19 2122 10/17/19 0550 10/17/19 0801  BP: 119/67 (!) 104/55 127/69   Pulse: (!) 110 91 91   Resp: 16 16 16    Temp: 98.6 F (37 C) 99.7 F (37.6 C) 98.9 F (37.2 C)   TempSrc: Oral Oral Oral   SpO2: 96% 97% 97% 98%  Weight:      Height:        Intake/Output Summary (Last 24 hours) at 10/17/2019 1348 Last data filed at 10/17/2019 0300 Gross per 24 hour  Intake 2074.96 ml  Output --  Net 2074.96 ml   Weight  change:  Exam:   General:  Pt is alert, follows  commands appropriately, not in acute distress  HEENT: No icterus, No thrush, No neck mass, [place]/AT  Cardiovascular: RRR, S1/S2, no rubs, no gallops  Respiratory: CTA bilaterally, no wheezing, no crackles, no rhonchi  Abdomen: Soft/+BS, non tender, non distended, no guarding  Extremities: No edema, No lymphangitis, No petechiae, No rashes, no synovitis   Data Reviewed: I have personally reviewed following labs and imaging studies Basic Metabolic Panel: Recent Labs  Lab 10/15/19 0029 10/15/19 0341 10/16/19 0712 10/17/19 0447  NA 134* 136 136 139  K 4.2 4.0 3.1* 3.2*  CL 99 100 107 110  CO2 21* 24 21* 21*  GLUCOSE 152* 116* 93 84  BUN 36* 34* 21 13  CREATININE 2.13* 1.93* 1.54* 1.39*  CALCIUM 9.8 8.9 7.8* 7.9*  MG  --   --   --  1.8   Liver Function Tests: Recent Labs  Lab 10/15/19 0029 10/16/19 0712  AST 34 26  ALT 33 25  ALKPHOS 95 49  BILITOT 1.2 0.7  PROT 8.6* 5.6*  ALBUMIN 4.8 2.9*   Recent Labs  Lab 10/15/19 0029  LIPASE 33   No results for input(s): AMMONIA in the last 168 hours. Coagulation Profile: Recent Labs  Lab 10/15/19 0727  INR 1.1   CBC: Recent Labs  Lab 10/15/19 0029 10/16/19 0712 10/17/19 0447  WBC 13.2* 6.2 5.4  NEUTROABS 12.2*  --   --   HGB 15.7 11.2* 11.0*  HCT 47.3 33.9* 33.1*  MCV 92.6 92.4 93.0  PLT 266 185 184   Cardiac Enzymes: No results for input(s): CKTOTAL, CKMB, CKMBINDEX, TROPONINI in the last 168 hours. BNP: Invalid input(s): POCBNP CBG: No results for input(s): GLUCAP in the last 168 hours. HbA1C: No results for input(s): HGBA1C in the last 72 hours. Urine analysis:    Component Value Date/Time   COLORURINE YELLOW 10/15/2019 0327   APPEARANCEUR HAZY (A) 10/15/2019 0327   LABSPEC 1.017 10/15/2019 0327   PHURINE 5.0 10/15/2019 0327   GLUCOSEU NEGATIVE 10/15/2019 0327   HGBUR NEGATIVE 10/15/2019 0327   BILIRUBINUR NEGATIVE 10/15/2019 0327   KETONESUR 5 (A) 10/15/2019 0327   PROTEINUR NEGATIVE 10/15/2019  0327   NITRITE NEGATIVE 10/15/2019 0327   LEUKOCYTESUR TRACE (A) 10/15/2019 0327   Sepsis Labs: @LABRCNTIP (procalcitonin:4,lacticidven:4) ) Recent Results (from the past 240 hour(s))  Urine culture     Status: None   Collection Time: 10/15/19  6:31 AM   Specimen: Urine, Clean Catch  Result Value Ref Range Status   Specimen Description   Final    URINE, CLEAN CATCH Performed at [facility], [address], [place], [place] [address]    Special Requests   Final    NONE Performed at [facility], [address] [place], [place] [address]    Culture   Final    NO GROWTH Performed at [facility], [place] [address], [place], [place] [address]    Report Status 10/16/2019 FINAL  Final  Respiratory Panel by RT PCR (Flu A&B, Covid) - Nasopharyngeal Swab     Status: None   Collection Time: 10/15/19  7:09 AM   Specimen: Nasopharyngeal Swab  Result Value Ref Range Status   SARS Coronavirus 2 by RT PCR NEGATIVE NEGATIVE Final    Comment: (NOTE) SARS-CoV-2 target nucleic acids are NOT DETECTED. The SARS-CoV-2 RNA is generally detectable in upper respiratoy specimens during the acute phase of infection. The lowest concentration of SARS-CoV-2 viral copies  this assay can detect is 131 copies/mL. A negative result does not preclude SARS-Cov-2 infection and should not be used as the sole basis for treatment or other patient management decisions. A negative result may occur with  improper specimen collection/handling, submission of specimen other than nasopharyngeal swab, presence of viral mutation(s) within the areas targeted by this assay, and inadequate number of viral copies (<131 copies/mL). A negative result must be combined with clinical observations, patient history, and epidemiological information. The expected result is Negative. Fact Sheet for Patients:  PinkCheek.be Fact Sheet for Healthcare Providers:   GravelBags.it This test is not yet ap proved or cleared by the [place] FDA and  has been authorized for detection and/or diagnosis of SARS-CoV-2 by FDA under an Emergency Use Authorization (EUA). This EUA will remain  in effect (meaning this test can be used) for the duration of the COVID-19 declaration under Section 564(b)(1) of the Act, 21 U.S.C. section 360bbb-3(b)(1), unless the authorization is terminated or revoked sooner.    Influenza A by PCR NEGATIVE NEGATIVE Final   Influenza B by PCR NEGATIVE NEGATIVE Final    Comment: (NOTE) The Xpert Xpress SARS-CoV-2/FLU/RSV assay is intended as an aid in  the diagnosis of influenza from Nasopharyngeal swab specimens and  should not be used as a sole basis for treatment. Nasal washings and  aspirates are unacceptable for Xpert Xpress SARS-CoV-2/FLU/RSV  testing. Fact Sheet for Patients: PinkCheek.be Fact Sheet for Healthcare Providers: GravelBags.it This test is not yet approved or cleared by the [place] FDA and  has been authorized for detection and/or diagnosis of SARS-CoV-2 by  FDA under an Emergency Use Authorization (EUA). This EUA will remain  in effect (meaning this test can be used) for the duration of the  Covid-19 declaration under Section 564(b)(1) of the Act, 21  U.S.C. section 360bbb-3(b)(1), unless the authorization is  terminated or revoked. Performed at [facility], [address], [place], [place] [address]   Blood culture (routine x 2)     Status: None (Preliminary result)   Collection Time: 10/15/19  7:21 AM   Specimen: BLOOD LEFT HAND  Result Value Ref Range Status   Specimen Description   Final    BLOOD LEFT HAND BOTTLES DRAWN AEROBIC AND ANAEROBIC   Special Requests Blood Culture adequate volume  Final   Culture   Final    NO GROWTH 2 DAYS Performed at [facility], [address], [place], [place] [address]     Report Status PENDING  Incomplete  Blood culture (routine x 2)     Status: None (Preliminary result)   Collection Time: 10/15/19  7:27 AM   Specimen: BLOOD RIGHT HAND  Result Value Ref Range Status   Specimen Description   Final    BLOOD RIGHT HAND BOTTLES DRAWN AEROBIC AND ANAEROBIC   Special Requests Blood Culture adequate volume  Final   Culture   Final    NO GROWTH 2 DAYS Performed at [facility], [address] [place], [place] [address]    Report Status PENDING  Incomplete  C Difficile Quick Screen w PCR reflex     Status: None   Collection Time: 10/16/19  1:21 PM   Specimen: Stool  Result Value Ref Range Status   C Diff antigen NEGATIVE NEGATIVE Final   C Diff toxin NEGATIVE NEGATIVE Final   C Diff interpretation No C. difficile detected.  Final    Comment: Performed at [facility], [address] [place], [place] [address]  Scheduled Meds: . aspirin EC  81 mg Oral Daily  . atorvastatin  40 mg Oral Q M,W,F  . bisoprolol  2.5 mg Oral Daily  . buPROPion  150 mg Oral Daily  . cholecalciferol  5,000 Units Oral Daily  . darifenacin  15 mg Oral Daily  . enoxaparin (LOVENOX) injection  40 mg Subcutaneous Q24H  . famotidine  20 mg Oral QHS  . fluticasone furoate-vilanterol  1 puff Inhalation Daily   And  . umeclidinium bromide  1 puff Inhalation Daily  . pantoprazole (PROTONIX) IV  40 mg Intravenous Q12H  . potassium chloride  40 mEq Oral Once   Continuous Infusions: . cefTRIAXone (ROCEPHIN)  IV 2 g (10/17/19 1042)  . lactated ringers 100 mL/hr at 10/17/19 1015  . vancomycin Stopped (10/16/19 1310)    Procedures/Studies: CT ABDOMEN PELVIS WO CONTRAST  Result Date: 10/15/2019 CLINICAL DATA:  69 year old male with history of nausea and vomiting. EXAM: CT ABDOMEN AND PELVIS WITHOUT CONTRAST TECHNIQUE: Multidetector CT imaging of the abdomen and pelvis was performed following the standard protocol without IV contrast. COMPARISON:  CT the abdomen and pelvis 11/24/2016.  FINDINGS: Lower chest: Aortic atherosclerosis. Hepatobiliary: No definite suspicious cystic or solid hepatic lesions are confidently identified on today's noncontrast CT examination. Unenhanced appearance of the gallbladder is normal. Pancreas: No definite pancreatic mass or peripancreatic fluid collections or inflammatory changes are noted on today's noncontrast CT examination. Spleen: Unremarkable. Adrenals/Urinary Tract: Small amount of perinephric stranding bilaterally, nonspecific and similar to the prior examination. Unenhanced appearance of the kidneys and bilateral adrenal glands is otherwise unremarkable. No calculi identified within the collecting system of either kidney, along the course of either ureter, or within the lumen of the urinary bladder. No hydroureteronephrosis. Urinary bladder is normal in appearance. Stomach/Bowel: Unenhanced appearance of the stomach is normal. No pathologic dilatation of small bowel or colon. Status post appendectomy. Vascular/Lymphatic: Aortic atherosclerosis. No lymphadenopathy noted in the abdomen or pelvis. Reproductive: Prostate gland and seminal vesicles are unremarkable in appearance. Other: Small left inguinal hernia containing only fat. Small umbilical hernia containing only omental fat. No significant volume of ascites. No pneumoperitoneum. Musculoskeletal: There are no aggressive appearing lytic or blastic lesions noted in the visualized portions of the skeleton. IMPRESSION: 1. No acute findings are noted in the abdomen or pelvis to account for the patient's symptoms. 2. Aortic atherosclerosis. 3. Additional incidental findings, as above. Electronically Signed   By: Vinnie Langton M.D.   On: 10/15/2019 06:15    Orson Eva, DO  [facility]  If 7PM-7AM, please contact night-coverage [email or web address] Password [email or web address] 10/17/2019, 1:48 PM   LOS: 2 days

## 2019-10-17 NOTE — Plan of Care (Signed)

## 2019-10-17 NOTE — Care Management Important Message (Signed)
Important Message  Patient Details  Name: [name] Park MRN: [id number] Date of Birth: 06-06-1951   Medicare Important Message Given:  Yes     Tommy Medal 10/17/2019, 3:27 PM

## 2019-10-18 DIAGNOSIS — A419 Sepsis, unspecified organism: Secondary | ICD-10-CM | POA: Diagnosis not present

## 2019-10-18 DIAGNOSIS — A0811 Acute gastroenteropathy due to Norwalk agent: Secondary | ICD-10-CM

## 2019-10-18 DIAGNOSIS — N1832 Chronic kidney disease, stage 3b: Secondary | ICD-10-CM | POA: Diagnosis not present

## 2019-10-18 DIAGNOSIS — I10 Essential (primary) hypertension: Secondary | ICD-10-CM | POA: Diagnosis not present

## 2019-10-18 DIAGNOSIS — N179 Acute kidney failure, unspecified: Secondary | ICD-10-CM | POA: Diagnosis not present

## 2019-10-18 LAB — BASIC METABOLIC PANEL
Anion gap: 8 (ref 5–15)
BUN: 11 mg/dL (ref 8–23)
CO2: 21 mmol/L — ABNORMAL LOW (ref 22–32)
Calcium: 8 mg/dL — ABNORMAL LOW (ref 8.9–10.3)
Chloride: 111 mmol/L (ref 98–111)
Creatinine, Ser: 1.41 mg/dL — ABNORMAL HIGH (ref 0.61–1.24)
GFR calc Af Amer: 58 mL/min — ABNORMAL LOW (ref >60)
GFR calc non Af Amer: 50 mL/min — ABNORMAL LOW (ref >60)
Glucose, Bld: 84 mg/dL (ref 70–99)
Potassium: 3.4 mmol/L — ABNORMAL LOW (ref 3.5–5.1)
Sodium: 140 mmol/L (ref 135–145)

## 2019-10-18 LAB — MAGNESIUM: Magnesium: 2.2 mg/dL (ref 1.7–2.4)

## 2019-10-18 MED ORDER — POTASSIUM CHLORIDE CRYS ER 20 MEQ PO TBCR
20.0000 meq | EXTENDED_RELEASE_TABLET | Freq: Once | ORAL | Status: AC
  Administered 2019-10-18: 20 meq via ORAL
  Filled 2019-10-18: qty 1

## 2019-10-18 MED ORDER — BISOPROLOL FUMARATE 5 MG PO TABS: 2.5000 mg | ORAL_TABLET | Freq: Every day | ORAL | 1 refills | Status: DC

## 2019-10-18 NOTE — Discharge Instructions (Signed)
Nausea and Vomiting, Adult Nausea is feeling sick to your stomach or feeling that you are about to throw up (vomit). Vomiting is when food in your stomach is thrown up and out of the mouth. Throwing up can make you feel weak. It can also make you lose too much water in your body (get dehydrated). If you lose too much water in your body, you may:  Feel tired.  Feel thirsty.  Have a dry mouth.  Have cracked lips.  Go pee (urinate) less often. Older adults and people with other diseases or a weak body defense system (immune system) are at higher risk for losing too much water in the body. If you feel sick to your stomach and you throw up, it is important to follow instructions from your doctor about how to take care of yourself. Follow these instructions at home: Watch your symptoms for any changes. Tell your doctor about them. Follow these instructions to care for yourself at home. Eating and drinking      Take an ORS (oral rehydration solution). This is a drink that is sold at pharmacies and stores.  Drink clear fluids in small amounts as you are able, such as: ? Water. ? Ice chips. ? Fruit juice that has water added (diluted fruit juice). ? Low-calorie sports drinks.  Eat bland, easy-to-digest foods in small amounts as you are able, such as: ? Bananas. ? Applesauce. ? Rice. ? Low-fat (lean) meats. ? Toast. ? Crackers.  Avoid drinking fluids that have a lot of sugar or caffeine in them. This includes energy drinks, sports drinks, and soda.  Avoid alcohol.  Avoid spicy or fatty foods. General instructions  Take over-the-counter and prescription medicines only as told by your doctor.  Drink enough fluid to keep your pee (urine) pale yellow.  Wash your hands often with soap and water. If you cannot use soap and water, use hand sanitizer.  Make sure that all people in your home wash their hands well and often.  Rest at home while you get better.  Watch your condition  for any changes.  Take slow and deep breaths when you feel sick to your stomach.  Keep all follow-up visits as told by your doctor. This is important. Contact a doctor if:  Your symptoms get worse.  You have new symptoms.  You have a fever.  You cannot drink fluids without throwing up.  You feel sick to your stomach for more than 2 days.  You feel light-headed or dizzy.  You have a headache.  You have muscle cramps.  You have a rash.  You have pain while peeing. Get help right away if:  You have pain in your chest, neck, arm, or jaw.  You feel very weak or you pass out (faint).  You throw up again and again.  You have throw up that is bright red or looks like black coffee grounds.  You have bloody or black poop (stools) or poop that looks like tar.  You have a very bad headache, a stiff neck, or both.  You have very bad pain, cramping, or bloating in your belly (abdomen).  You have trouble breathing.  You are breathing very quickly.  Your heart is beating very quickly.  Your skin feels cold and clammy.  You feel confused.  You have signs of losing too much water in your body, such as: ? Dark pee, very little pee, or no pee. ? Cracked lips. ? Dry mouth. ? Sunken eyes. ?  Sleepiness. ? Weakness. These symptoms may be an emergency. Do not wait to see if the symptoms will go away. Get medical help right away. Call your local emergency services (911 in the [place] Do not drive yourself to the hospital. Summary  Nausea is feeling sick to your stomach or feeling that you are about to throw up (vomit). Vomiting is when food in your stomach is thrown up and out of the mouth.  Follow instructions from your doctor about eating and drinking to keep from losing too much water in your body.  Take over-the-counter and prescription medicines only as told by your doctor.  Contact your doctor if your symptoms get worse or you have new symptoms.  Keep all follow-up  visits as told by your doctor. This is important. This information is not intended to replace advice given to you by your health care provider. Make sure you discuss any questions you have with your health care provider. Document Revised: 10/01/2018 Document Reviewed: 11/17/2017 Elsevier Patient Education  2020 Elsevier Inc.  

## 2019-10-18 NOTE — Progress Notes (Signed)
Discharge instructions reviewed with patient. AVS given. Prescriptions sent to his pharmacy by provider, patient verbalized understanding of instructions and to pick up prescriptions and follow-up as directed. Patient left floor in stable condition accompanied by nursing staff. Discharged home.

## 2019-10-19 ENCOUNTER — Telehealth: Payer: Self-pay | Admitting: *Deleted

## 2019-10-19 NOTE — Telephone Encounter (Signed)
Called patient on 10/19/2019 , 12:03 PM in an attempt to reach the patient for a hospital follow up.   Admit date: 10/14/19 Discharge: 10/18/19   He does not have any questions or concerns about medications from the hospital admission. The patient's medications were reviewed over the phone, they were counseled to bring in all current medications to the hospital follow up visit.   I advised the patient to call if any questions or concerns arise about the hospital admission or medications. Patient states he is not having diarrhea at this time. States he feels much better.    Home health was not started in the hospital.  All questions were answered and a follow up appointment was made. Patient has an appointment 10/27/2019 with Dr Melford Aase, for a hospital follow up visit and labs.  Prior to Admission medications   Medication Sig Start Date End Date Taking? Authorizing Provider  aspirin EC 81 MG tablet Take 81 mg by mouth See admin instructions. Take 1 tablet (81 mg) by mouth 3 times week - Sunday, Wednesday and Friday - with lunch     [provider]  atorvastatin (LIPITOR) 40 MG tablet Take 1 tablet Daily for Cholesterol 12/27/18   Unk Pinto, MD  bisoprolol (ZEBETA) 5 MG tablet Take 0.5 tablets (2.5 mg total) by mouth daily. 10/19/19   Orson Eva, MD  buPROPion (WELLBUTRIN XL) 150 MG 24 hr tablet Take 1 tablet Daily for Mood Patient taking differently: Take 150 mg by mouth daily. Take 1 tablet Daily for Mood 02/21/19   Unk Pinto, MD  Cholecalciferol (VITAMIN D-3) 1000 UNITS CAPS Take 5,000 Units by mouth daily.     [provider]  famotidine (PEPCID) 40 MG tablet Take 1 tablet at Bedtime for   Acid Indigestion & Reflux 06/20/19   Unk Pinto, MD  loperamide (IMODIUM) 2 MG capsule Take 1 capsule (2 mg total) by mouth as needed for diarrhea or loose stools. 10/17/19   Orson Eva, MD  solifenacin (VESICARE) 10 MG tablet Take 1 tablet Daily for Bladder Patient taking  differently: Take 10 mg by mouth daily. Take 1 tablet Daily for Bladder 02/21/19   Unk Pinto, MD

## 2019-10-20 ENCOUNTER — Ambulatory Visit: Payer: Medicare Other | Admitting: Internal Medicine

## 2019-10-20 LAB — CULTURE, BLOOD (ROUTINE X 2)
Culture: NO GROWTH
Culture: NO GROWTH
Special Requests: ADEQUATE
Special Requests: ADEQUATE

## 2019-10-26 ENCOUNTER — Encounter: Payer: Self-pay | Admitting: Internal Medicine

## 2019-10-26 NOTE — Patient Instructions (Signed)

## 2019-10-26 NOTE — Progress Notes (Signed)
[name] Park     This very nice 69 y.o. MWM was admitted to the hospital on  10/14/2019  and patient was discharged from the hospital  9 days ago on  10/18/2019 . The patient now presents for follow up for transition from recent hospitalization.  The day after discharge  our clinical staff contacted the patient to assure stability and schedule a follow up appointment. The discharge summary, medications and diagnostic test results were reviewed before meeting with the patient. The patient was admitted for:   Marland Kitchen Acute infectious nonbacterial gastroenteritis  . Rotavirus enteritis  . Dehydration  . Acute on chronic renal insufficiency  . Essential hypertension  . COPD with chronic bronchitis ([place])  . Food poisoning  . Hypokalemia       Patient was admitted with Dehydration & acute on chronic renal insufficiency with acute gastroenteritis 2 hours after eating at [facility]. Stool tests returned  negative tor C.Diff and other GI pathogens and patient  did test (+) positive for Roto virus. Patient was aggressively rehydrated and hypokalemia was repleted.  Urine cultures were also negative    At discharge , his HCTZ was d/c'd and he was switched  from [place] to [facility]    Hospitalization discharge instructions and medications are reconciled with the patient.      Patient is also followed with Hypertension, Hyperlipidemia, Pre-Diabetes and Vitamin D Deficiency.      Patient is treated for HTN & BP has been controlled at home. Today's BP: 124/66. Patient has had no complaints of any cardiac type chest pain, palpitations, dyspnea/orthopnea/PND, dizziness, claudication, or dependent edema.     Hyperlipidemia is controlled with diet & meds. Patient denies myalgias or other med SE's. Last Lipids were at goal:  Lab Results  Component Value Date   CHOL 126 07/21/2019   HDL 50 07/21/2019   LDLCALC 58 07/21/2019   TRIG 95 07/21/2019   CHOLHDL 2.5 07/21/2019      Also, the  patient has history of T2_NIDDM PreDiabetes and has had no symptoms of reactive hypoglycemia, diabetic polys, paresthesias or visual blurring.  Last A1c was  Lab Results  Component Value Date   HGBA1C 5.5 04/14/2019      Further, the patient also has history of Vitamin D Deficiency and supplements vitamin D without any suspected side-effects. Last vitamin D was near goal:  Lab Results  Component Value Date   VD25OH 54 04/14/2019   Current Outpatient Medications on File Prior to Visit  Medication Sig  . aspirin EC 81 MG tablet Take 81 mg by mouth See admin instructions. Take 1 tablet (81 mg) by mouth 3 times week - Sunday, Wednesday and Friday - with lunch   . atorvastatin (LIPITOR) 40 MG tablet Take 1 tablet Daily for Cholesterol  . bisoprolol (ZEBETA) 5 MG tablet Take 0.5 tablets (2.5 mg total) by mouth daily.  Marland Kitchen buPROPion (WELLBUTRIN XL) 150 MG 24 hr tablet Take 1 tablet Daily for Mood (Patient taking differently: Take 150 mg by mouth daily. Take 1 tablet Daily for Mood)  . Cholecalciferol (VITAMIN D-3) 1000 UNITS CAPS Take 5,000 Units by mouth daily.   . famotidine (PEPCID) 40 MG tablet Take 1 tablet at Bedtime for   Acid Indigestion & Reflux  . loperamide (IMODIUM) 2 MG capsule Take 1 capsule (2 mg total) by mouth as needed for diarrhea or loose stools.  . solifenacin (VESICARE) 10 MG tablet Take 1 tablet Daily for Bladder (Patient taking differently: Take  10 mg by mouth daily. Take 1 tablet Daily for Bladder)   No current facility-administered medications on file prior to visit.   No Known Allergies PMHx:   Past Medical History:  Diagnosis Date  . Acute appendicitis 11/24/2016  . COPD (chronic obstructive pulmonary disease) ([place])   . GERD (gastroesophageal reflux disease)   . Hematuria   . HLD (hyperlipidemia)   . Hypertension   . IBS (irritable bowel syndrome)   . Pre-diabetes   . Vitamin D deficiency    Immunization History  Administered Date(s) Administered  . Influenza  Whole 04/07/2013  . Influenza, High Dose Seasonal PF 04/13/2014, 03/27/2015, 03/25/2016, 04/08/2018, 04/14/2019  . PFIZER SARS-COV-2 Vaccination 07/15/2019, 08/04/2019  . PPD Test 12/05/2013  . Pneumococcal Conjugate-13 06/08/2014  . Pneumococcal Polysaccharide-23 09/07/2014  . Tdap 04/29/2011  . Zoster 06/12/2014   Past Surgical History:  Procedure Laterality Date  . CARPAL TUNNEL RELEASE Right   . CATARACT EXTRACTION, BILATERAL Bilateral    Dr. Prudencio Burly  . CERVICAL FUSION    . HAND SURGERY Left   . LAPAROSCOPIC APPENDECTOMY N/A 11/24/2016   Procedure: APPENDECTOMY LAPAROSCOPIC;  Surgeon: Clovis Riley, MD;  Location: [facility] OR;  Service: General;  Laterality: N/A;  . TRIGGER FINGER RELEASE Right    thumb   FHx:    Reviewed / unchanged  SHx:    Reviewed / unchanged  Systems Review:  Constitutional: Denies fever, chills, wt changes, headaches, insomnia, fatigue, night sweats, change in appetite. Eyes: Denies redness, blurred vision, diplopia, discharge, itchy, watery eyes.  ENT: Denies discharge, congestion, post nasal drip, epistaxis, sore throat, earache, hearing loss, dental pain, tinnitus, vertigo, sinus pain, snoring.  CV: Denies chest pain, palpitations, irregular heartbeat, syncope, dyspnea, diaphoresis, orthopnea, PND, claudication or edema. Respiratory: denies cough, dyspnea, DOE, pleurisy, hoarseness, laryngitis, wheezing.  Gastrointestinal: Denies dysphagia, odynophagia, heartburn, reflux, water brash, abdominal pain or cramps, nausea, vomiting, bloating, diarrhea, constipation, hematemesis, melena, hematochezia  or hemorrhoids. Genitourinary: Denies dysuria, frequency, urgency, nocturia, hesitancy, discharge, hematuria or flank pain. Musculoskeletal: Denies arthralgias, myalgias, stiffness, jt. swelling, pain, limping or strain/sprain.  Skin: Denies pruritus, rash, hives, warts, acne, eczema or change in skin lesion(s). Neuro: No weakness, tremor, incoordination, spasms,  paresthesia or pain. Psychiatric: Denies confusion, memory loss or sensory loss. Endo: Denies change in weight, skin or hair change.  Heme/Lymph: No excessive bleeding, bruising or enlarged lymph nodes.  Physical Exam  BP 124/66   Pulse 72   Temp (!) 97 F (36.1 C)   Resp 16   Ht 5\' 8"  (1.727 m)   Wt 181 lb 3.2 oz (82.2 kg)   BMI 27.55 kg/m   Appears well nourished, well groomed  and in no distress.  Eyes: PERRLA, EOMs, conjunctiva no swelling or erythema. Sinuses: No frontal/maxillary tenderness ENT/Mouth: EAC's clear, TM's nl w/o erythema, bulging. Nares clear w/o erythema, swelling, exudates. Oropharynx clear without erythema or exudates. Oral hygiene is good. Tongue normal, non obstructing. Hearing intact.  Neck: Supple. Thyroid nl. Car 2+/2+ without bruits, nodes or JVD. Chest: Respirations nl with BS clear & equal w/o rales, rhonchi, wheezing or stridor.  Cor: Heart sounds normal w/ regular rate and rhythm without sig. murmurs, gallops, clicks or rubs. Peripheral pulses normal and equal  without edema.  Abdomen: Soft & bowel sounds normal. Non-tender w/o guarding, rebound, hernias, masses or organomegaly.  Lymphatics: Unremarkable.  Musculoskeletal: Full ROM all peripheral extremities, joint stability, 5/5 strength and normal gait.  Skin: Warm, dry without exposed rashes, lesions or ecchymosis apparent.  Neuro: Cranial nerves intact, reflexes equal bilaterally. Sensory-motor testing grossly intact. Tendon reflexes grossly intact.  Pysch: Alert & oriented x 3.  Insight and judgement nl & appropriate. No ideations.  Assessment and Plan:  1. Acute infectious nonbacterial gastroenteritis  - CBC with Differential/Platelet - COMPLETE METABOLIC PANEL WITH GFR  2. Rotavirus enteritis  3. Dehydration  - CBC with Differential/Platelet - COMPLETE METABOLIC PANEL WITH GFR - Magnesium  4. Acute on chronic renal insufficiency  - COMPLETE METABOLIC PANEL WITH GFR  5.  Essential hypertension  - Continue medication, monitor blood pressure at home.  - Continue DASH diet. Reminder to go to the ER if any CP,  SOB, nausea, dizziness, severe HA, changes vision/speech.  - CBC with Differential/Platelet - COMPLETE METABOLIC PANEL WITH GFR  6. COPD  ([place])  7. Food poisoning   8. Hypokalemia  - COMPLETE METABOLIC PANEL WITH GFR - Magnesium  9. Medication management  - CBC with Differential/Platelet - COMPLETE METABOLIC PANEL WITH GFR - Magnesium       Discussed  regular exercise, BP monitoring, weight control to achieve/maintain BMI less than 25 and discussed meds and SE's. Recommended labs to assess and monitor clinical status with further disposition pending results of labs. Over 30 minutes of exam, counseling, chart review was performed.

## 2019-10-27 ENCOUNTER — Ambulatory Visit (INDEPENDENT_AMBULATORY_CARE_PROVIDER_SITE_OTHER): Payer: Medicare Other | Admitting: Internal Medicine

## 2019-10-27 ENCOUNTER — Other Ambulatory Visit: Payer: Self-pay

## 2019-10-27 VITALS — BP 124/66 | HR 72 | Temp 97.0°F | Resp 16 | Ht 68.0 in | Wt 181.2 lb

## 2019-10-27 DIAGNOSIS — Z79899 Other long term (current) drug therapy: Secondary | ICD-10-CM

## 2019-10-27 DIAGNOSIS — A059 Bacterial foodborne intoxication, unspecified: Secondary | ICD-10-CM

## 2019-10-27 DIAGNOSIS — N189 Chronic kidney disease, unspecified: Secondary | ICD-10-CM | POA: Diagnosis not present

## 2019-10-27 DIAGNOSIS — E860 Dehydration: Secondary | ICD-10-CM

## 2019-10-27 DIAGNOSIS — A09 Infectious gastroenteritis and colitis, unspecified: Secondary | ICD-10-CM | POA: Diagnosis not present

## 2019-10-27 DIAGNOSIS — N289 Disorder of kidney and ureter, unspecified: Secondary | ICD-10-CM | POA: Diagnosis not present

## 2019-10-27 DIAGNOSIS — A080 Rotaviral enteritis: Secondary | ICD-10-CM | POA: Diagnosis not present

## 2019-10-27 DIAGNOSIS — I10 Essential (primary) hypertension: Secondary | ICD-10-CM

## 2019-10-27 DIAGNOSIS — J449 Chronic obstructive pulmonary disease, unspecified: Secondary | ICD-10-CM

## 2019-10-27 DIAGNOSIS — E876 Hypokalemia: Secondary | ICD-10-CM

## 2019-10-28 LAB — COMPLETE METABOLIC PANEL WITH GFR
AG Ratio: 1.6 (calc) (ref 1.0–2.5)
ALT: 40 U/L (ref 9–46)
AST: 29 U/L (ref 10–35)
Albumin: 4 g/dL (ref 3.6–5.1)
Alkaline phosphatase (APISO): 77 U/L (ref 35–144)
BUN/Creatinine Ratio: 14 (calc) (ref 6–22)
BUN: 21 mg/dL (ref 7–25)
CO2: 30 mmol/L (ref 20–32)
Calcium: 10.1 mg/dL (ref 8.6–10.3)
Chloride: 103 mmol/L (ref 98–110)
Creat: 1.51 mg/dL — ABNORMAL HIGH (ref 0.70–1.18)
GFR, Est African American: 53 mL/min/{1.73_m2} — ABNORMAL LOW (ref >=60)
GFR, Est Non African American: 46 mL/min/{1.73_m2} — ABNORMAL LOW (ref >=60)
Globulin: 2.5 g/dL (calc) (ref 1.9–3.7)
Glucose, Bld: 96 mg/dL (ref 65–99)
Potassium: 5.3 mmol/L (ref 3.5–5.3)
Sodium: 141 mmol/L (ref 135–146)
Total Bilirubin: 0.5 mg/dL (ref 0.2–1.2)
Total Protein: 6.5 g/dL (ref 6.1–8.1)

## 2019-10-28 LAB — CBC WITH DIFFERENTIAL/PLATELET
Absolute Monocytes: 940 cells/uL (ref 200–950)
Basophils Absolute: 66 cells/uL (ref 0–200)
Basophils Relative: 0.7 %
Eosinophils Absolute: 301 cells/uL (ref 15–500)
Eosinophils Relative: 3.2 %
HCT: 36.8 % — ABNORMAL LOW (ref 38.5–50.0)
Hemoglobin: 12.3 g/dL — ABNORMAL LOW (ref 13.2–17.1)
Lymphs Abs: 1795 cells/uL (ref 850–3900)
MCH: 30.4 pg (ref 27.0–33.0)
MCHC: 33.4 g/dL (ref 32.0–36.0)
MCV: 91.1 fL (ref 80.0–100.0)
MPV: 11.1 fL (ref 7.5–12.5)
Monocytes Relative: 10 %
Neutro Abs: 6298 cells/uL (ref 1500–7800)
Neutrophils Relative %: 67 %
Platelets: 383 10*3/uL (ref 140–400)
RBC: 4.04 10*6/uL — ABNORMAL LOW (ref 4.20–5.80)
RDW: 12.3 % (ref 11.0–15.0)
Total Lymphocyte: 19.1 %
WBC: 9.4 10*3/uL (ref 3.8–10.8)

## 2019-10-28 LAB — MAGNESIUM: Magnesium: 2.2 mg/dL (ref 1.5–2.5)

## 2019-11-14 ENCOUNTER — Encounter: Payer: Self-pay | Admitting: Nurse Practitioner

## 2019-11-14 ENCOUNTER — Other Ambulatory Visit (INDEPENDENT_AMBULATORY_CARE_PROVIDER_SITE_OTHER): Payer: Medicare Other

## 2019-11-14 ENCOUNTER — Telehealth: Payer: Self-pay | Admitting: Gastroenterology

## 2019-11-14 ENCOUNTER — Ambulatory Visit: Payer: Medicare Other | Admitting: Nurse Practitioner

## 2019-11-14 VITALS — BP 122/78 | HR 68 | Ht 69.0 in | Wt 173.1 lb

## 2019-11-14 DIAGNOSIS — K588 Other irritable bowel syndrome: Secondary | ICD-10-CM

## 2019-11-14 DIAGNOSIS — N189 Chronic kidney disease, unspecified: Secondary | ICD-10-CM

## 2019-11-14 DIAGNOSIS — A0811 Acute gastroenteropathy due to Norwalk agent: Secondary | ICD-10-CM | POA: Diagnosis not present

## 2019-11-14 LAB — CBC WITH DIFFERENTIAL/PLATELET
Basophils Absolute: 0 10*3/uL (ref 0.0–0.1)
Basophils Relative: 0.4 % (ref 0.0–3.0)
Eosinophils Absolute: 0.2 10*3/uL (ref 0.0–0.7)
Eosinophils Relative: 2.1 % (ref 0.0–5.0)
HCT: 38.5 % — ABNORMAL LOW (ref 39.0–52.0)
Hemoglobin: 13.1 g/dL (ref 13.0–17.0)
Lymphocytes Relative: 15.8 % (ref 12.0–46.0)
Lymphs Abs: 1.3 10*3/uL (ref 0.7–4.0)
MCHC: 34.1 g/dL (ref 30.0–36.0)
MCV: 90.3 fl (ref 78.0–100.0)
Monocytes Absolute: 0.9 10*3/uL (ref 0.1–1.0)
Monocytes Relative: 11.8 % (ref 3.0–12.0)
Neutro Abs: 5.6 10*3/uL (ref 1.4–7.7)
Neutrophils Relative %: 69.9 % (ref 43.0–77.0)
Platelets: 255 10*3/uL (ref 150.0–400.0)
RBC: 4.27 Mil/uL (ref 4.22–5.81)
RDW: 13.4 % (ref 11.5–15.5)
WBC: 8 10*3/uL (ref 4.0–10.5)

## 2019-11-14 LAB — COMPREHENSIVE METABOLIC PANEL
ALT: 13 U/L (ref 0–53)
AST: 17 U/L (ref 0–37)
Albumin: 3.9 g/dL (ref 3.5–5.2)
Alkaline Phosphatase: 70 U/L (ref 39–117)
BUN: 12 mg/dL (ref 6–23)
CO2: 24 mEq/L (ref 19–32)
Calcium: 9 mg/dL (ref 8.4–10.5)
Chloride: 102 mEq/L (ref 96–112)
Creatinine, Ser: 1.49 mg/dL (ref 0.40–1.50)
GFR: 46.54 mL/min — ABNORMAL LOW (ref >60.00)
Glucose, Bld: 98 mg/dL (ref 70–99)
Potassium: 3.7 mEq/L (ref 3.5–5.1)
Sodium: 134 mEq/L — ABNORMAL LOW (ref 135–145)
Total Bilirubin: 0.6 mg/dL (ref 0.2–1.2)
Total Protein: 6.8 g/dL (ref 6.0–8.3)

## 2019-11-14 NOTE — Patient Instructions (Addendum)
If you are age 71 or older, your body mass index should be between 23-30. Your Body mass index is 25.57 kg/m. If this is out of the aforementioned range listed, please consider follow up with your Primary Care Provider.  If you are age 61 or younger, your body mass index should be between 19-25. Your Body mass index is 25.57 kg/m. If this is out of the aformentioned range listed, please consider follow up with your Primary Care Provider.   Your provider has requested that you go to the basement level for lab work before leaving today. Press "B" on the elevator. The lab is located at the first door on the left as you exit the elevator.  Please purchase the following medications over the counter and take as directed:  Continue Bacteria probiotic once daily. Add Florastor one by mouth twice a daily- start in 3 to 4 days Imodium 1/2 tablet by mouth twice daily before meals today then 1 tablet by mouth twice daily as needed. If no bowel movements with in 24 hours stop the imodium. Call Colleen, BP in 2 days for an update on your progress. Call if symptoms worsen  Due to recent changes in healthcare laws, you may see the results of your imaging and laboratory studies on MyChart before your provider has had a chance to review them.  We understand that in some cases there may be results that are confusing or concerning to you. Not all laboratory results come back in the same time frame and the provider may be waiting for multiple results in order to interpret others.  Please give [place] 48 hours in order for your provider to thoroughly review all the results before contacting the office for clarification of your results.   Thank you for choosing [facility] Noralyn Pick, CRNP

## 2019-11-14 NOTE — Telephone Encounter (Signed)
Patient reports that approx 4 weeks ago he was admitted for nausea and vomiting due to noro virus.  He was doing well until approximately a week ago when the symptoms have returned. Has been on a liquid diet.  He will come in this am and see Carl Best, RNP at 10:00

## 2019-11-14 NOTE — Progress Notes (Signed)
Reviewed and agree with management plan.  Apollonia Amini T. Dariane Natzke, MD FACG  Gastroenterology  

## 2019-11-14 NOTE — Progress Notes (Signed)
11/14/2019 [name] Park [id number] [birth year]   Chief Complaint:  Diarrhea   History of Present Illness: [name] Park 69 y.o.malewith medical history ofanxiety, depression, COPD, hypertension, CKD stage III and hyperlipidemia.  He developed abrupt onset of nausea, vomiting and diarrhea after eating takeout food from [facility] on 10/14/2019.  He presented to [facility] ED 4/24. He was febrile with a temperature of 101.7.  He was tachycardic with his heart rate in the 120s. His creatine level was elevated at 2.13. LFTs and lipase level normal.  GI pathogen was positive for norovirus.  C.diff testing was negative.  An abdominal/pelvic CT did not show any evidence of colitis or other cause for his symptoms.  Urinalysis 50 > WBC.  He received  Cipro.  Urine cultures were negative.  He received IV Vancomycin and Ceftriaxone for possible sepsis.  He received IV fluids and his renal status improved.  His creatinine level was 1.41 and his diarrhea improved with 2-3 loose BMs at the time of discharge 10/17/2019.  He was prescribed loperamide 2 mg 1 p.o. daily as needed.  He was seen in the office 1 week later by his PCP, at that time he reported passing 3-4 soft bowel movements daily.  His watery diarrhea abated.  However,  last week he developed soft mushy stools with mucus and gas.  Friday 5/21 he developed watery diarrhea which tested into Saturday.  Sunday 5/23 he passed 5-6 soft mushy stools.  No BM yet today.  He reported having mild left lower quadrant discomfort which started on Friday.  No severe abdominal pain.  No fever.  He has lost 12 pounds since being diagnosed with the norovirus.  He went back on a clear liquid diet 3 days ago.  Eating bland food triggers his diarrhea.  He took Imodium x 3 doses yesterday.  Started taking a bacteria probiotic 2 days ago.   CBC Latest Ref Rng & Units 10/27/2019 10/17/2019 10/16/2019  WBC 3.8 - 10.8 Thousand/uL 9.4 5.4 6.2  Hemoglobin 13.2 - 17.1  g/dL 12.3(L) 11.0(L) 11.2(L)  Hematocrit 38.5 - 50.0 % 36.8(L) 33.1(L) 33.9(L)  Platelets 140 - 400 Thousand/uL 383 184 185   CMP Latest Ref Rng & Units 10/27/2019 10/18/2019 10/17/2019  Glucose 65 - 99 mg/dL 96 84 84  BUN 7 - 25 mg/dL 21 11 13   Creatinine 0.70 - 1.18 mg/dL 1.51(H) 1.41(H) 1.39(H)  Sodium 135 - 146 mmol/L 141 140 139  Potassium 3.5 - 5.3 mmol/L 5.3 3.4(L) 3.2(L)  Chloride 98 - 110 mmol/L 103 111 110  CO2 20 - 32 mmol/L 30 21(L) 21(L)  Calcium 8.6 - 10.3 mg/dL 10.1 8.0(L) 7.9(L)  Total Protein 6.1 - 8.1 g/dL 6.5 - -  Total Bilirubin 0.2 - 1.2 mg/dL 0.5 - -  Alkaline Phos 38 - 126 U/L - - -  AST 10 - 35 U/L 29 - -  ALT 9 - 46 U/L 40 - -    Abdominal/Pelvic CT without contrast 10/14/2019:  1. No acute findings are noted in the abdomen or pelvis to account for the patient's symptoms. 2. Aortic atherosclerosis. 3.  Small left inguinal hernia containing only fat.  Colonoscopy by Dr. Fuller Plan 02/28/2016: - The entire examined colon is normal on direct and retroflexion views. - No specimens collected.  Current Outpatient Medications on File Prior to Visit  Medication Sig Dispense Refill  . aspirin EC 81 MG tablet Take 81 mg by mouth See admin instructions. Take 1 tablet (  81 mg) by mouth 3 times week - Sunday, Wednesday and Friday - with lunch     . atorvastatin (LIPITOR) 40 MG tablet Take 1 tablet Daily for Cholesterol 90 tablet 3  . bisoprolol (ZEBETA) 5 MG tablet Take 0.5 tablets (2.5 mg total) by mouth daily. 30 tablet 1  . buPROPion (WELLBUTRIN XL) 150 MG 24 hr tablet Take 1 tablet Daily for Mood (Patient taking differently: Take 150 mg by mouth daily. Take 1 tablet Daily for Mood) 90 tablet 3  . Cholecalciferol (VITAMIN D-3) 1000 UNITS CAPS Take 5,000 Units by mouth daily.     . famotidine (PEPCID) 40 MG tablet Take 1 tablet at Bedtime for   Acid Indigestion & Reflux 90 tablet 3  . loperamide (IMODIUM) 2 MG capsule Take 1 capsule (2 mg total) by mouth as needed for diarrhea  or loose stools. 30 capsule 0  . solifenacin (VESICARE) 10 MG tablet Take 1 tablet Daily for Bladder (Patient taking differently: Take 10 mg by mouth daily. Take 1 tablet Daily for Bladder) 90 tablet 3   No current facility-administered medications on file prior to visit.    No Known Allergies   Current Medications, Allergies, Past Medical History, Past Surgical History, Family History and Social History were reviewed in [employer] record.   Physical Exam: BP 122/78   Pulse 68   Ht 5\' 9"  (1.753 m)   Wt 173 lb 2 oz (78.5 kg)   BMI 25.57 kg/m  General: Well developed 69 year old male in no acute distress. Head: Normocephalic and atraumatic. Eyes: No scleral icterus. Conjunctiva pink . Ears: Normal auditory acuity. Mouth: Upper and lower dentures intact. No ulcers or lesions. Tongue moist but fissured. Buccal mucosa moist. Lungs: Clear throughout to auscultation. Heart: Regular rate and rhythm, no murmur. Abdomen: Soft, nontender and nondistended. No masses or hepatomegaly. Normal bowel sounds x 4 quadrants.  Rectal: Small anterior skin tag, no stool or fecal impaction in the rectum.  Musculoskeletal: Symmetrical with no gross deformities. Extremities: No edema. Neurological: Alert oriented x 4. No focal deficits.  Psychological: Alert and cooperative. Normal mood and affect  Assessment and Recommendations:  1. Norovirus with post infectious IBS.  -CBC, CMP -Repeat C. difficile PCR if diarrhea persists -Patient to call me in 48 hours with an update -Continue bacteria probiotic once daily.  Add Florastor 1 p.o. twice daily in the next few days. -Patient to call our office if his left lower quadrant discomfort worsen  2.  Acute on CKD improving

## 2019-11-16 ENCOUNTER — Other Ambulatory Visit: Payer: Self-pay

## 2019-11-16 DIAGNOSIS — K588 Other irritable bowel syndrome: Secondary | ICD-10-CM

## 2019-11-16 DIAGNOSIS — N189 Chronic kidney disease, unspecified: Secondary | ICD-10-CM

## 2019-11-16 DIAGNOSIS — I10 Essential (primary) hypertension: Secondary | ICD-10-CM

## 2019-11-16 DIAGNOSIS — E860 Dehydration: Secondary | ICD-10-CM

## 2019-11-16 DIAGNOSIS — Z79899 Other long term (current) drug therapy: Secondary | ICD-10-CM

## 2019-12-06 ENCOUNTER — Telehealth: Payer: Self-pay | Admitting: Nurse Practitioner

## 2019-12-06 NOTE — Telephone Encounter (Signed)
Hi, if his sx are worse then he should see any APP who has availability in office this week as I am at [facility] this week. If ok with [facility], then he can be scheduled in 2pm spot next week.  Thx

## 2019-12-06 NOTE — Telephone Encounter (Signed)
Hi Colleen,  You are currently booked until 01/12/20 and patient called requesting to follow up with you within a few days per your previous MyChart message. Would it be ok for him to schedule then or can I put in a 2:00pm spot?  Please advise on scheduling thank you

## 2019-12-07 ENCOUNTER — Other Ambulatory Visit: Payer: Self-pay | Admitting: Internal Medicine

## 2019-12-07 MED ORDER — BISOPROLOL FUMARATE 5 MG PO TABS: ORAL_TABLET | ORAL | 0 refills | Status: DC

## 2019-12-08 ENCOUNTER — Ambulatory Visit
Admission: RE | Admit: 2019-12-08 | Discharge: 2019-12-08 | Disposition: A | Discharge status: Home / Self Care | Payer: Medicare Other | Source: Ambulatory Visit | Attending: Adult Health | Admitting: Adult Health

## 2019-12-08 ENCOUNTER — Other Ambulatory Visit: Payer: Self-pay

## 2019-12-08 DIAGNOSIS — R918 Other nonspecific abnormal finding of lung field: Secondary | ICD-10-CM

## 2019-12-12 ENCOUNTER — Other Ambulatory Visit (INDEPENDENT_AMBULATORY_CARE_PROVIDER_SITE_OTHER): Payer: Medicare Other

## 2019-12-12 DIAGNOSIS — K588 Other irritable bowel syndrome: Secondary | ICD-10-CM | POA: Diagnosis not present

## 2019-12-12 DIAGNOSIS — I10 Essential (primary) hypertension: Secondary | ICD-10-CM | POA: Diagnosis not present

## 2019-12-12 DIAGNOSIS — N189 Chronic kidney disease, unspecified: Secondary | ICD-10-CM | POA: Diagnosis not present

## 2019-12-12 DIAGNOSIS — E860 Dehydration: Secondary | ICD-10-CM | POA: Diagnosis not present

## 2019-12-12 DIAGNOSIS — Z79899 Other long term (current) drug therapy: Secondary | ICD-10-CM

## 2019-12-12 LAB — CBC WITH DIFFERENTIAL/PLATELET
Basophils Absolute: 0.1 10*3/uL (ref 0.0–0.1)
Basophils Relative: 1 % (ref 0.0–3.0)
Eosinophils Absolute: 0.2 10*3/uL (ref 0.0–0.7)
Eosinophils Relative: 2.6 % (ref 0.0–5.0)
HCT: 36.7 % — ABNORMAL LOW (ref 39.0–52.0)
Hemoglobin: 12.2 g/dL — ABNORMAL LOW (ref 13.0–17.0)
Lymphocytes Relative: 19.4 % (ref 12.0–46.0)
Lymphs Abs: 1.3 10*3/uL (ref 0.7–4.0)
MCHC: 33.3 g/dL (ref 30.0–36.0)
MCV: 90.3 fl (ref 78.0–100.0)
Monocytes Absolute: 0.8 10*3/uL (ref 0.1–1.0)
Monocytes Relative: 12.2 % — ABNORMAL HIGH (ref 3.0–12.0)
Neutro Abs: 4.3 10*3/uL (ref 1.4–7.7)
Neutrophils Relative %: 64.8 % (ref 43.0–77.0)
Platelets: 317 10*3/uL (ref 150.0–400.0)
RBC: 4.07 Mil/uL — ABNORMAL LOW (ref 4.22–5.81)
RDW: 13.5 % (ref 11.5–15.5)
WBC: 6.6 10*3/uL (ref 4.0–10.5)

## 2019-12-12 LAB — IBC + FERRITIN
Ferritin: 99.1 ng/mL (ref 22.0–322.0)
Iron: 59 ug/dL (ref 42–165)
Saturation Ratios: 20.4 % (ref 20.0–50.0)
Transferrin: 207 mg/dL — ABNORMAL LOW (ref 212.0–360.0)

## 2019-12-12 LAB — BASIC METABOLIC PANEL
BUN: 11 mg/dL (ref 6–23)
CO2: 29 mEq/L (ref 19–32)
Calcium: 9.5 mg/dL (ref 8.4–10.5)
Chloride: 102 mEq/L (ref 96–112)
Creatinine, Ser: 1.5 mg/dL (ref 0.40–1.50)
GFR: 46.18 mL/min — ABNORMAL LOW (ref >60.00)
Glucose, Bld: 109 mg/dL — ABNORMAL HIGH (ref 70–99)
Potassium: 5.1 mEq/L (ref 3.5–5.1)
Sodium: 138 mEq/L (ref 135–145)

## 2019-12-12 LAB — VITAMIN B12: Vitamin B-12: 311 pg/mL (ref 211–911)

## 2019-12-21 ENCOUNTER — Ambulatory Visit: Payer: Medicare Other | Admitting: Nurse Practitioner

## 2019-12-21 ENCOUNTER — Encounter: Payer: Self-pay | Admitting: Nurse Practitioner

## 2019-12-21 VITALS — BP 120/64 | HR 69 | Ht 69.0 in | Wt 167.1 lb

## 2019-12-21 DIAGNOSIS — D649 Anemia, unspecified: Secondary | ICD-10-CM

## 2019-12-21 DIAGNOSIS — K589 Irritable bowel syndrome without diarrhea: Secondary | ICD-10-CM

## 2019-12-21 DIAGNOSIS — Z8719 Personal history of other diseases of the digestive system: Secondary | ICD-10-CM | POA: Diagnosis not present

## 2019-12-21 DIAGNOSIS — Z8619 Personal history of other infectious and parasitic diseases: Secondary | ICD-10-CM

## 2019-12-21 NOTE — Patient Instructions (Signed)
If you are age 71 or older, your body mass index should be between 23-30. Your Body mass index is 24.68 kg/m. If this is out of the aforementioned range listed, please consider follow up with your Primary Care Provider.  If you are age 67 or younger, your body mass index should be between 19-25. Your Body mass index is 24.68 kg/m. If this is out of the aformentioned range listed, please consider follow up with your Primary Care Provider.   1. Follow up with your Primary Care Physician regarding low to normal vitamin B12 level. 2. Repeat labs 02/01/2020 at [facility] lab. 3.Call office if abdominal pain, diarrhea or further weight loss occurs. 4.Continue Florastor 1 tablet twice daily for 4 weeks. 5. Advance your diet as tolerated.  Due to recent changes in healthcare laws, you may see the results of your imaging and laboratory studies on MyChart before your provider has had a chance to review them.  We understand that in some cases there may be results that are confusing or concerning to you. Not all laboratory results come back in the same time frame and the provider may be waiting for multiple results in order to interpret others.  Please give [place] 48 hours in order for your provider to thoroughly review all the results before contacting the office for clarification of your results.   Thank you for choosing [facility] Noralyn Pick, CRNP

## 2019-12-21 NOTE — Progress Notes (Signed)
Reviewed and agree with management plan.  Drue Camera T. Shondrika Hoque, MD FACG [facility]  

## 2019-12-21 NOTE — Progress Notes (Signed)
12/21/2019 [name] Park [id number] Dec 16, 1950   Chief Complaint: Follow up diarrhea   History of Present Illness: [name] Park 69 y.o.malewith medical history ofanxiety, depression, COPD, hypertension, CKD stage III and hyperlipidemia. I saw [name] Park in the office of 11/14/2019 for further evaluation after he was diagnosed with the Norovirus 10/14/2019 after he presented to [facility] ED with a fever, tachycardia, AKI and N/V/D. At the time of his follow up appointment, he reported having mild LLQ discomfort with recurrence of loose stools. He was advised to start Florastor and Imodium for post infectious IBS. His weight dropped to 168 lbs. His diarrhea slowly improved. He developed softer stools last week. He is now passing 2 solid less yellow and more brown colored stools for the past 4 to 5 days. His appetite increased.  No further weight loss.  Energy level has improved.  He has advanced his diet without any distress.  He ate a hamburger last week.  He drank a milkshake yesterday.  His most recent colonoscopy was 02/28/2016 which was normal.  His wife is present.  CBC Latest Ref Rng & Units 12/12/2019 11/14/2019 10/27/2019  WBC 4.0 - 10.5 K/uL 6.6 8.0 9.4  Hemoglobin 13.0 - 17.0 g/dL 12.2(L) 13.1 12.3(L)  Hematocrit 39 - 52 % 36.7(L) 38.5(L) 36.8(L)  Platelets 150 - 400 K/uL 317.0 255.0 383  MCV 90.3  CMP Latest Ref Rng & Units 12/12/2019 11/14/2019 10/27/2019  Glucose 70 - 99 mg/dL 109(H) 98 96  BUN 6 - 23 mg/dL 11 12 21   Creatinine 0.40 - 1.50 mg/dL 1.50 1.49 1.51(H)  Sodium 135 - 145 mEq/L 138 134(L) 141  Potassium 3.5 - 5.1 mEq/L 5.1 3.7 5.3  Chloride 96 - 112 mEq/L 102 102 103  CO2 19 - 32 mEq/L 29 24 30   Calcium 8.4 - 10.5 mg/dL 9.5 9.0 10.1  Total Protein 6.0 - 8.3 g/dL - 6.8 6.5  Total Bilirubin 0.2 - 1.2 mg/dL - 0.6 0.5  Alkaline Phos 39 - 117 U/L - 70 -  AST 0 - 37 U/L - 17 29  ALT 0 - 53 U/L - 13 40  Iron 59. Vitamin B12 311  Current Medications, Allergies, Past  Medical History, Past Surgical History, Family History and Social History were reviewed in [employer] record.   Physical Exam: BP 120/64   Pulse 69   Ht 5\' 9"  (1.753 m)   Wt 167 lb 2 oz (75.8 kg)   BMI 24.68 kg/m    Wt Readings from Last 3 Encounters:  12/21/19 167 lb 2 oz (75.8 kg)  11/14/19 173 lb 2 oz (78.5 kg)  10/27/19 181 lb 3.2 oz (82.2 kg)    General: Well developed 69 year old male in no acute distress. Head: Normocephalic and atraumatic. Eyes: No scleral icterus. Conjunctiva pink . Ears: Normal auditory acuity. Lungs: Clear throughout to auscultation. Heart: Regular rate and rhythm, no murmur. Abdomen: Soft, nontender and nondistended. No masses or hepatomegaly. Normal bowel sounds x 4 quadrants.  Rectal: Deferred.  Musculoskeletal: Symmetrical with no gross deformities. Extremities: No edema. Neurological: Alert oriented x 4. No focal deficits.  Psychological: Alert and cooperative. Normal mood and affect  Assessment and Recommendations:  74.  69 year old male who was admitted to the hospital with norovirus 10/14/2019. 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  No further diarrhea for the past week.  Bowel frequency has decreased to twice daily.  Stool color is normalizing.  -Continue Florastor probiotic twice daily for 4 more  weeks. -Patient to call our office if his diarrhea or lower abdominal pain recurs  2.  14lb weight loss secondary to #1. No further weight loss for the past week. Weight 181 -> 167 lbs -Patient to advance his diet as tolerated -I discussed scheduling an abdominal/pelvic CAT scan with oral and IV contrast if further weight loss develops or if his abdominal pain recurs  3.  Chronic normocytic anemia.  Baseline hemoglobin 12-13.  Iron levels normal.  Vitamin B12 level low normal at 311. -Advised patient to follow-up with his primary care physician to consider vitamin B12 replacement with low normal B12 levels -Repeat CBC  vitamin B12 levels in 6 weeks  4. CKD, stable

## 2019-12-28 NOTE — Progress Notes (Signed)
History of Present Illness:     This very nice 69 yo MWM who  has been followed for HTN, HLD, Prediabetes and Vitamin D Deficiency. Patient is over 2 months recovering from a Rotovirus enterocolitis in late April (4/23-27). He had a relapse of diarrheal sx's for several days 1 month later which resolved with symptomatic treatment.        In F/U of his hospitalization, he had recent labs at GI Dr Lynne Leader office showing a stable mild  chronic anemia and Vitamin B12 was low at 311 (from several normal B12 levels over the last 4 years). He presents today to discuss treatment. Patient also relates sx's of OAB with urge incontinence Has been on Vesicare for hx/o urinary retention in the past.   Medications  .  atorvastatin (LIPITOR) 40 MG tablet, Take 1 tablet Daily for Cholesterol .  bisoprolol (ZEBETA) 5 MG tablet, Take 1 tablet Daily for BP .  aspirin EC 81 MG tablet, Take 81 mg by mouth See admin instructions. Take 1 tablet (81 mg) by mouth 3 times week - Sunday, Wednesday and Friday - with lunch   .  buPROPion (WELLBUTRIN XL) 150 MG 24 hr tablet, Take 1 tablet Daily for Mood (Patient taking differently: Take 150 mg by mouth daily. Take 1 tablet Daily for Mood) .  Cholecalciferol (VITAMIN D-3) 1000 UNITS CAPS, Take 5,000 Units by mouth daily.  .  famotidine (PEPCID) 40 MG tablet, Take 1 tablet at Bedtime for   Acid Indigestion & Reflux .  loperamide (IMODIUM) 2 MG capsule, Take 1 capsule (2 mg total) by mouth as needed for diarrhea or loose stools. .  solifenacin (VESICARE) 10 MG tablet, Take 1 tablet Daily for Bladder (Patient taking differently: Take 10 mg by mouth daily. Take 1 tablet Daily for Bladder)  Problem list He has Vitamin D deficiency; Essential hypertension; Gastroesophageal reflux disease; OAB (overactive bladder); Diverticulosis; Hyperlipidemia, mixed; COPD (chronic obstructive pulmonary disease) with chronic bronchitis ([place]); Generalized anxiety disorder; Thoracic aorta  atherosclerosis ([place]); Overweight (BMI 25.0-29.9); Former smoker (54 pack year); Chronic kidney disease, stage 3; Pulmonary nodules; Medication management; Abnormal glucose; Sepsis due to undetermined organism ([place]); Acute renal failure superimposed on stage 3b chronic kidney disease ([place]); Nausea and vomiting; Urinary tract infection without hematuria; and Enteritis due to Norovirus on their problem list.   Observations/Objective:   BP 114/68   Pulse 60   Temp (!) 97.3 F (36.3 C)   Resp 16   Ht 5' 8.5" (1.74 m)   Wt 167 lb 6.4 oz (75.9 kg)   BMI 25.08 kg/m   No formal exam  - discussed diagnosis and treatment of deficiency anemias and recommended further lags to evaluate for B12 deficiency. Also discussed patient's hx/o urinary retention & treatment in past and his current sx's of urge incontinence . Patient given 2 week sx's of Myrbetriq 25 mg. Patient advise to stop Vesicare while trying the Myrbetriq.  Assessment and Plan:  1. Other dietary vitamin B12 deficiency anemia  - Folate RBC - Methylmalonic acid, serum - Reticulocytes - CBC with Differential/Platelet  2. OAB (overactive bladder)  - Sx's Myrbetriq         I discussed the assessment and treatment plan with the patient. The patient was provided an opportunity to ask questions and all were answered. The patient agreed with the plan and demonstrated an understanding of the instructions. Between 15-20 minutes of counseling, chart review, and critical decision making was performed  The patient was advised to call back or seek an in-person evaluation if the symptoms worsen or if the condition fails to improve as anticipated.   Kirtland Bouchard, MD

## 2019-12-29 ENCOUNTER — Other Ambulatory Visit: Payer: Self-pay

## 2019-12-29 ENCOUNTER — Ambulatory Visit (INDEPENDENT_AMBULATORY_CARE_PROVIDER_SITE_OTHER): Payer: Medicare Other | Admitting: Internal Medicine

## 2019-12-29 ENCOUNTER — Encounter: Payer: Self-pay | Admitting: Internal Medicine

## 2019-12-29 VITALS — BP 114/68 | HR 60 | Temp 97.3°F | Resp 16 | Ht 68.5 in | Wt 167.4 lb

## 2019-12-29 DIAGNOSIS — N3281 Overactive bladder: Secondary | ICD-10-CM | POA: Diagnosis not present

## 2019-12-29 DIAGNOSIS — D513 Other dietary vitamin B12 deficiency anemia: Secondary | ICD-10-CM | POA: Diagnosis not present

## 2019-12-29 NOTE — Patient Instructions (Addendum)
Overactive Bladder, Adult  Overactive bladder refers to a condition in which a person has a sudden need to pass urine. The person may leak urine if he or she cannot get to the bathroom fast enough (urinary incontinence). A person with this condition may also wake up several times in the night to go to the bathroom. Overactive bladder is associated with poor nerve signals between your bladder and your brain. Your bladder may get the signal to empty before it is full. You may also have very sensitive muscles that make your bladder squeeze too soon. These symptoms might interfere with daily work or social activities. What are the causes? This condition may be associated with or caused by:  Urinary tract infection.  Infection of nearby tissues, such as the prostate.  Prostate enlargement.  Surgery on the uterus or urethra.  Bladder stones, inflammation, or tumors.  Drinking too much caffeine or alcohol.  Certain medicines, especially medicines that get rid of extra fluid in the body (diuretics).  Muscle or nerve weakness, especially from: ? A spinal cord injury. ? Stroke. ? Multiple sclerosis. ? Parkinson's disease.  Diabetes.  Constipation. What increases the risk? You may be at greater risk for overactive bladder if you:  Are an older adult.  Smoke.  Are going through menopause.  Have prostate problems.  Have a neurological disease, such as stroke, dementia, Parkinson's disease, or multiple sclerosis (MS).  Eat or drink things that irritate the bladder. These include alcohol, spicy food, and caffeine.  Are overweight or obese. What are the signs or symptoms? Symptoms of this condition include:  Sudden, strong urge to urinate.  Leaking urine.  Urinating 8 or more times a day.  Waking up to urinate 2 or more times a night. How is this diagnosed? Your health care provider may suspect overactive bladder based on your symptoms. He or she will diagnose this condition  by:  A physical exam and medical history.  Blood or urine tests. You might need bladder or urine tests to help determine what is causing your overactive bladder. You might also need to see a health care provider who specializes in urinary tract problems (urologist). How is this treated? Treatment for overactive bladder depends on the cause of your condition and whether it is mild or severe. You can also make lifestyle changes at home. Options include:  Bladder training. This may include: ? Learning to control the urge to urinate by following a schedule that directs you to urinate at regular intervals (timed voiding). ? Doing Kegel exercises to strengthen your pelvic floor muscles, which support your bladder. Toning these muscles can help you control urination, even if your bladder muscles are overactive.  Special devices. This may include: ? Biofeedback, which uses sensors to help you become aware of your body's signals. ? Electrical stimulation, which uses electrodes placed inside the body (implanted) or outside the body. These electrodes send gentle pulses of electricity to strengthen the nerves or muscles that control the bladder. ? Women may use a plastic device that fits into the vagina and supports the bladder (pessary).  Medicines. ? Antibiotics to treat bladder infection. ? Antispasmodics to stop the bladder from releasing urine at the wrong time. ? Tricyclic antidepressants to relax bladder muscles. ? Injections of botulinum toxin type A directly into the bladder tissue to relax bladder muscles.  Lifestyle changes. This may include: ? Weight loss. Talk to your health care provider about weight loss methods that would work best for you. ?   Diet changes. This may include reducing how much alcohol and caffeine you consume, or drinking fluids at different times of the day. ? Not smoking. Do not use any products that contain nicotine or tobacco, such as cigarettes and e-cigarettes. If  you need help quitting, ask your health care provider.  Surgery. ? A device may be implanted to help manage the nerve signals that control urination. ? An electrode may be implanted to stimulate electrical signals in the bladder. ? A procedure may be done to change the shape of the bladder. This is done only in very severe cases. Follow these instructions at home: Lifestyle  Make any diet or lifestyle changes that are recommended by your health care provider. These may include: ? Drinking less fluid or drinking fluids at different times of the day. ? Cutting down on caffeine or alcohol. ? Doing Kegel exercises. ? Losing weight if needed. ? Eating a healthy and balanced diet to prevent constipation. This may include:  Eating foods that are high in fiber, such as fresh fruits and vegetables, whole grains, and beans.  Limiting foods that are high in fat and processed sugars, such as fried and sweet foods. General instructions  Take over-the-counter and prescription medicines only as told by your health care provider.  If you were prescribed an antibiotic medicine, take it as told by your health care provider. Do not stop taking the antibiotic even if you start to feel better.  Use any implants or pessary as told by your health care provider.  If needed, wear pads to absorb urine leakage.  Keep a journal or log to track how much and when you drink and when you feel the need to urinate. This will help your health care provider monitor your condition.  Keep all follow-up visits as told by your health care provider. This is important. Contact a health care provider if:  You have a fever.  Your symptoms do not get better with treatment.  Your pain and discomfort get worse.  You have more frequent urges to urinate. Get help right away if:  You are not able to control your bladder. Summary  Overactive bladder refers to a condition in which a person has a sudden need to pass  urine.  Several conditions may lead to an overactive bladder.  Treatment for overactive bladder depends on the cause and severity of your condition.  Follow your health care provider's instructions about lifestyle changes, doing Kegel exercises, keeping a journal, and taking medicines. This information is not intended to replace advice given to you by your health care provider. Make sure you discuss any questions you have with your health care provider. Document Revised: 09/30/2018 Document Reviewed: 06/25/2017 Elsevier Patient Education  [place].   ++++++++++++++++++++++++++++++ Vitamin B12 Deficiency Vitamin B12 deficiency occurs when the body does not have enough vitamin B12, which is an important vitamin. The body needs this vitamin:  To make red blood cells.  To make DNA. This is the genetic material inside cells.  To help the nerves work properly so they can carry messages from the brain to the body. Vitamin B12 deficiency can cause various health problems, such as a low red blood cell count (anemia) or nerve damage. What are the causes? This condition may be caused by:  Not eating enough foods that contain vitamin B12.  Not having enough stomach acid and digestive fluids to properly absorb vitamin B12 from the food that you eat.  Certain digestive system diseases that  make it hard to absorb vitamin B12. These diseases include Crohn's disease, chronic pancreatitis, and cystic fibrosis.  A condition in which the body does not make enough of a protein (intrinsic factor), resulting in too few red blood cells (pernicious anemia).  Having a surgery in which part of the stomach or small intestine is removed.  Taking certain medicines that make it hard for the body to absorb vitamin B12. These medicines include: ? Heartburn medicines (antacids and proton pump inhibitors). ? Certain antibiotic medicines. ? Some medicines that are used to treat diabetes, tuberculosis,  gout, or high cholesterol. What increases the risk? The following factors may make you more likely to develop a B12 deficiency:  Being older than age 70.  Eating a vegetarian or vegan diet, especially while you are pregnant.  Eating a poor diet while you are pregnant.  Taking certain medicines.  Having alcoholism. What are the signs or symptoms? In some cases, there are no symptoms of this condition. If the condition leads to anemia or nerve damage, various symptoms can occur, such as:  Weakness.  Fatigue.  Loss of appetite.  Weight loss.  Numbness or tingling in your hands and feet.  Redness and burning of the tongue.  Confusion or memory problems.  Depression.  Sensory problems, such as color blindness, ringing in the ears, or loss of taste.  Diarrhea or constipation.  Trouble walking. If anemia is severe, symptoms can include:  Shortness of breath.  Dizziness.  Rapid heart rate (tachycardia). How is this diagnosed? This condition may be diagnosed with a blood test to measure the level of vitamin B12 in your blood. You may also have other tests, including:  A group of tests that measure certain characteristics of blood cells (complete blood count, CBC).  A blood test to measure intrinsic factor.  A procedure where a thin tube with a camera on the end is used to look into your stomach or intestines (endoscopy). Other tests may be needed to discover the cause of B12 deficiency. How is this treated? Treatment for this condition depends on the cause. This condition may be treated by:  Changing your eating and drinking habits, such as: ? Eating more foods that contain vitamin B12. ? Drinking less alcohol or no alcohol.  Getting vitamin B12 injections.  Taking vitamin B12 supplements. Your health care provider will tell you which dosage is best for you. Follow these instructions at home: Eating and drinking   Eat lots of healthy foods that contain  vitamin B12, including: ? Meats and poultry. This includes beef, pork, chicken, [place], and organ meats, such as liver. ? Seafood. This includes clams, rainbow trout, salmon, tuna, and haddock. ? Eggs. ? Cereal and dairy products that are fortified. This means that vitamin B12 has been added to the food. Check the label on the package to see if the food is fortified. The items listed above may not be a complete list of recommended foods and beverages. Contact a dietitian for more information. General instructions  Get any injections that are prescribed by your health care provider.  Take supplements only as told by your health care provider. Follow the directions carefully.  Do not drink alcohol if your health care provider tells you not to. In some cases, you may only be asked to limit alcohol use.  Keep all follow-up visits as told by your health care provider. This is important. Contact a health care provider if:  Your symptoms come back. Get help right away  if you:  Develop shortness of breath.  Have a rapid heart rate.  Have chest pain.  Become dizzy or lose consciousness. Summary  Vitamin B12 deficiency occurs when the body does not have enough vitamin B12.  The main causes of vitamin B12 deficiency include dietary deficiency, digestive diseases, pernicious anemia, and having a surgery in which part of the stomach or small intestine is removed.  In some cases, there are no symptoms of this condition. If the condition leads to anemia or nerve damage, various symptoms can occur, such as weakness, shortness of breath, and numbness.  Treatment may include getting vitamin B12 injections or taking vitamin B12 supplements. Eat lots of healthy foods that contain vitamin B12. ======================================== Vitamin B12 oral What is this medicine? CYANOCOBALAMIN (sye an oh koe BAL a min) is a man made form of vitamin B12. Vitamin B12 is essential in the development of  healthy blood cells, nerve cells, and proteins in the body. It also helps with the metabolism of fats and carbohydrates. It is added to a healthy diet to prevent or treat low vitamin B-12 levels. This medicine may be used for other purposes; ask your health care provider or pharmacist if you have questions. What should I tell my health care provider before I take this medicine? They need to know if you have any of these conditions: anemia kidney disease Leber's disease malabsorption disorder an unusual or allergic reaction to cyanocobalamin, cobalt, other medicines, foods, dyes, or preservatives pregnant or trying to get pregnant breast-feeding How should I use this medicine? Take this medicine by mouth with a glass of water. Follow the directions on the package or prescription label. If you are taking the tablets, do not chew, cut, or crush this medicine. If using an vitamin solution, use a specially marked spoon or dropper to measure each dose. Ask your pharmacist if you do not have one. Household spoons are not accurate. For best results take this vitamin with food. Take your medicine at regular intervals. Do not take your medicine more often than directed. Talk to your pediatrician regarding the use of this medicine in children. While this drug may be prescribed for selected conditions, precautions do apply. Overdosage: If you think you have taken too much of this medicine contact a poison control center or emergency room at once. NOTE: This medicine is only for you. Do not share this medicine with others. What if I miss a dose? If you miss a dose, take it as soon as you can. If it is almost time for your next dose, take only that dose. Do not take double or extra doses. What may interact with this medicine? alcohol aminosalicylic acid colchicine medicines that suppress your bone marrow like chemotherapy, chloramphenicol This list may not describe all possible interactions. Give your  health care provider a list of all the medicines, herbs, non-prescription drugs, or dietary supplements you use. Also tell them if you smoke, drink alcohol, or use illegal drugs. Some items may interact with your medicine. What should I watch for while using this medicine? Follow a healthy diet. Taking a vitamin supplement does not replace the need for a balanced diet. Some foods that have vitamin B-12 naturally are fish, seafood, egg yolk, milk and fermented cheese. Too much of this vitamin can be unsafe. Talk to your doctor or health care provider about how much is right for you. What side effects may I notice from receiving this medicine? Side effects that you should report to your  doctor or health care professional as soon as possible: allergic reactions like skin rash, itching or hives, swelling of the face, lips, or tongue breathing problems chest pain, tightness Side effects that usually do not require medical attention (report to your doctor or health care professional if they continue or are bothersome): diarrhea This list may not describe all possible side effects. Call your doctor for medical advice about side effects. You may report side effects to FDA at 1-800-FDA-1088. Where should I keep my medicine? Keep out of the reach of children.

## 2019-12-30 ENCOUNTER — Ambulatory Visit: Payer: Medicare Other | Admitting: Internal Medicine

## 2019-12-30 LAB — CBC WITH DIFFERENTIAL/PLATELET
Absolute Monocytes: 745 cells/uL (ref 200–950)
Basophils Absolute: 38 cells/uL (ref 0–200)
Basophils Relative: 0.5 %
Eosinophils Absolute: 182 cells/uL (ref 15–500)
Eosinophils Relative: 2.4 %
HCT: 36.9 % — ABNORMAL LOW (ref 38.5–50.0)
Hemoglobin: 12.2 g/dL — ABNORMAL LOW (ref 13.2–17.1)
Lymphs Abs: 1368 cells/uL (ref 850–3900)
MCH: 30.4 pg (ref 27.0–33.0)
MCHC: 33.1 g/dL (ref 32.0–36.0)
MCV: 92 fL (ref 80.0–100.0)
MPV: 11.5 fL (ref 7.5–12.5)
Monocytes Relative: 9.8 %
Neutro Abs: 5267 cells/uL (ref 1500–7800)
Neutrophils Relative %: 69.3 %
Platelets: 273 10*3/uL (ref 140–400)
RBC: 4.01 10*6/uL — ABNORMAL LOW (ref 4.20–5.80)
RDW: 12.3 % (ref 11.0–15.0)
Total Lymphocyte: 18 %
WBC: 7.6 10*3/uL (ref 3.8–10.8)

## 2019-12-30 LAB — RETICULOCYTES
ABS Retic: 60150 cells/uL (ref 25000–9000)
Retic Ct Pct: 1.5 %

## 2019-12-30 LAB — FOLATE RBC: RBC Folate: 704 ng/mL RBC (ref >280)

## 2020-01-09 ENCOUNTER — Other Ambulatory Visit: Payer: Self-pay | Admitting: Internal Medicine

## 2020-02-01 ENCOUNTER — Other Ambulatory Visit: Payer: Self-pay

## 2020-02-01 ENCOUNTER — Ambulatory Visit (INDEPENDENT_AMBULATORY_CARE_PROVIDER_SITE_OTHER): Payer: Medicare Other | Admitting: Physician Assistant

## 2020-02-01 ENCOUNTER — Other Ambulatory Visit (INDEPENDENT_AMBULATORY_CARE_PROVIDER_SITE_OTHER): Payer: Medicare Other

## 2020-02-01 ENCOUNTER — Encounter: Payer: Self-pay | Admitting: Physician Assistant

## 2020-02-01 VITALS — BP 124/88 | HR 65 | Temp 97.6°F | Wt 165.0 lb

## 2020-02-01 DIAGNOSIS — A09 Infectious gastroenteritis and colitis, unspecified: Secondary | ICD-10-CM

## 2020-02-01 DIAGNOSIS — I10 Essential (primary) hypertension: Secondary | ICD-10-CM

## 2020-02-01 DIAGNOSIS — N3281 Overactive bladder: Secondary | ICD-10-CM | POA: Diagnosis not present

## 2020-02-01 DIAGNOSIS — Z8719 Personal history of other diseases of the digestive system: Secondary | ICD-10-CM

## 2020-02-01 DIAGNOSIS — D649 Anemia, unspecified: Secondary | ICD-10-CM

## 2020-02-01 DIAGNOSIS — R39198 Other difficulties with micturition: Secondary | ICD-10-CM | POA: Diagnosis not present

## 2020-02-01 DIAGNOSIS — Z8619 Personal history of other infectious and parasitic diseases: Secondary | ICD-10-CM

## 2020-02-01 DIAGNOSIS — K589 Irritable bowel syndrome without diarrhea: Secondary | ICD-10-CM | POA: Diagnosis not present

## 2020-02-01 LAB — CBC
HCT: 36.7 % — ABNORMAL LOW (ref 39.0–52.0)
Hemoglobin: 12.5 g/dL — ABNORMAL LOW (ref 13.0–17.0)
MCHC: 34.1 g/dL (ref 30.0–36.0)
MCV: 89.5 fl (ref 78.0–100.0)
Platelets: 261 10*3/uL (ref 150.0–400.0)
RBC: 4.1 Mil/uL — ABNORMAL LOW (ref 4.22–5.81)
RDW: 13.8 % (ref 11.5–15.5)
WBC: 5.2 10*3/uL (ref 4.0–10.5)

## 2020-02-01 LAB — VITAMIN B12: Vitamin B-12: 220 pg/mL (ref 211–911)

## 2020-02-01 NOTE — Progress Notes (Signed)
FOLLOW UP  Assessment and Plan:  Abnormal urination/OAB Foamy urine- check for protein in the urine, may benefit from SPEP Get back on vesicare, declines Pelvic PT or urology referral at this time -     Urinalysis, Routine w reflex microscopic -     Microalbumin / creatinine urine ratio  Bilateral leg edema No evidence of DVT, no warmth, pain, erythema, neg Homen's Right more than left but he has had several injuries to his right leg, recent normal AB CT without evidence of lymphadenopathy or obstruction Has had some worsening renal function, foamy urine, will check for protein in urine however more likely from inactivity from recent illness. Will elevate, get compression socks and monitor.  If not better or any worse will follow up with the office.   Acute infectious nonbacterial gastroenteritis Has improved, continue imodium as needed  Thoracic aorta atherosclerosis ([place]) Control blood pressure, cholesterol, glucose, increase exercise.   COPD (chronic obstructive pulmonary disease) with chronic bronchitis (HCC) 53 pack year hx; quit smoking; continue to encourage avoidance of triggers; asymptomatic; monitoring; encourage regular exercise   Former smoker (54 pack year) Continue annual low dose screening CT per recommendations Needs repeat at CPE  Hypertension -     Microalbumin / creatinine urine ratio Well controlled with current medications  Monitor blood pressure at home; patient to call if consistently greater than 130/80 Continue DASH diet.   Reminder to go to the ER if any CP, SOB, nausea, dizziness, severe HA, changes vision/speech, left arm numbness and tingling and jaw pain.  Cholesterol Continue medication  Continue low cholesterol diet and exercise.  Check lipid panel next OV  Other abnormal glucose A1C well controlled on last several checks; defer today  Continue diet and exercise.  Perform daily foot/skin check, notify office of any concerning changes.   BMP  Overweight Long discussion about weight loss, diet, and exercise Recommended diet heavy in fruits and veggies and low in animal meats, cheeses, and dairy products, appropriate calorie intake Discussed appropriate weight for height Follow up in 3 months  Vitamin D Def/ osteoporosis prevention Continue supplementation Check Vit D level NEXT OV  CKD stage 3 Increase fluids, avoid NSAIDS, monitor sugars, recently checked will avoid  Continue diet and meds as discussed. Further disposition pending results of labs. Discussed med's effects and SE's.   Over 30 minutes of exam, counseling, chart review, and critical decision making was performed.   Future Appointments  Date Time Provider [place]  05/10/2020 10:00 AM Unk Pinto, MD [facility] None  08/08/2020 10:00 AM Liane Comber, NP [facility] None    ----------------------------------------------------------------------------------------------------------------------  HPI 69 y.o. male  presents for 3 month follow up on hypertension, cholesterol, history of abnormal glucose, COPD, weight and vitamin D deficiency.   No tremor, changes in memory, headaches, no numbness or tingling anywhere. He has never had MRI.   He had norovirus with severe AKI in April. Has seen GI and has post infectious IBS symptoms that are slowly improving.  He has lost weight.   He states he has urinary urgency, was urinating up to 10-15 times a day made worse with noravirus and myrbetriq. He is going 8-10 x a day, nocturia 1-2 x a night, urgency, with dribbling at the end, strong stream, no weakness or hesitation. He is off the vesicare.  No burning with it.  Will have more urgency.  He drinks very little decaf tea. No soft drinks, just water.  Normal CT without contrast 10/15/19  He has  had bilateral leg swelling since being in the hospital. His right leg is worse than the left, he did break his right leg 3 x. He has no warmth, no pain  in legs.   He is a former smoking with a 53 pack year smoking history, getting low dose CT scans, last one was 11/2018 he is due.   BMI is Body mass index is 24.72 kg/m., he has not been working on diet and exercise. Wt Readings from Last 3 Encounters:  02/01/20 165 lb (74.8 kg)  12/29/19 167 lb 6.4 oz (75.9 kg)  12/21/19 167 lb 2 oz (75.8 kg)   His blood pressure has been controlled at home, today their BP is BP: 124/88  He does not workout -due to back, has stopped also due to heat.  He denies chest pain, shortness of breath, dizziness.   He is on cholesterol medication and denies myalgias. His cholesterol is at goal. The cholesterol last visit was:   Lab Results  Component Value Date   CHOL 126 07/21/2019   HDL 50 07/21/2019   LDLCALC 58 07/21/2019   TRIG 95 07/21/2019   CHOLHDL 2.5 07/21/2019    He has not been working on diet and exercise for history of abnormal glucose, and denies increased appetite, nausea, paresthesia of the feet, polydipsia, polyuria, visual disturbances and vomiting. Last A1C in the office was:  Lab Results  Component Value Date   HGBA1C 5.5 04/14/2019   Patient is on Vitamin D supplement though remains below goal:   Lab Results  Component Value Date   VD25OH 54 04/14/2019        Current Medications:  Current Outpatient Medications on File Prior to Visit  Medication Sig  . aspirin EC 81 MG tablet Take 81 mg by mouth See admin instructions. Take 1 tablet (81 mg) by mouth 3 times week - Sunday, Wednesday and Friday - with lunch   . atorvastatin (LIPITOR) 40 MG tablet Take 1 tablet Daily for Cholesterol  . bisoprolol (ZEBETA) 5 MG tablet Take 1 tablet Daily for BP  . buPROPion (WELLBUTRIN XL) 150 MG 24 hr tablet Take 1 tablet Daily for Mood (Patient taking differently: Take 150 mg by mouth daily. Take 1 tablet Daily for Mood)  . Cholecalciferol (VITAMIN D-3) 1000 UNITS CAPS Take 5,000 Units by mouth daily.   . famotidine (PEPCID) 40 MG tablet Take 1  tablet at Bedtime for   Acid Indigestion & Reflux  . loperamide (IMODIUM) 2 MG capsule Take 1 capsule (2 mg total) by mouth as needed for diarrhea or loose stools.   No current facility-administered medications on file prior to visit.     Allergies: No Known Allergies   Medical History:  Past Medical History:  Diagnosis Date  . Acute appendicitis 11/24/2016  . COPD (chronic obstructive pulmonary disease) ([place])   . GERD (gastroesophageal reflux disease)   . Hematuria   . HLD (hyperlipidemia)   . Hypertension   . IBS (irritable bowel syndrome)   . Pre-diabetes   . Vitamin D deficiency    Family history- Reviewed and unchanged Social history- Reviewed and unchanged   Review of Systems:  Review of Systems  Constitutional: Positive for malaise/fatigue and weight loss. Negative for chills, diaphoresis and fever.  HENT: Negative for hearing loss and tinnitus.   Eyes: Negative for blurred vision and double vision.  Respiratory: Negative for cough, shortness of breath and wheezing.   Cardiovascular: Positive for leg swelling. Negative for chest pain, palpitations, orthopnea,  claudication and PND.  Gastrointestinal: Negative for abdominal pain, blood in stool, constipation, diarrhea, heartburn, melena, nausea and vomiting.  Genitourinary: Positive for frequency and urgency. Negative for dysuria, flank pain and hematuria.  Musculoskeletal: Negative for back pain, falls, joint pain, myalgias and neck pain.  Skin: Negative for rash.  Neurological: Negative for dizziness, tingling, sensory change, weakness and headaches.  Endo/Heme/Allergies: Negative for polydipsia.  Psychiatric/Behavioral: Positive for memory loss. Negative for depression and hallucinations. The patient is not nervous/anxious and does not have insomnia.   All other systems reviewed and are negative.   Physical Exam: BP 124/88   Pulse 65   Temp 97.6 F (36.4 C)   Wt 165 lb (74.8 kg)   SpO2 98%   BMI 24.72 kg/m   Wt Readings from Last 3 Encounters:  02/01/20 165 lb (74.8 kg)  12/29/19 167 lb 6.4 oz (75.9 kg)  12/21/19 167 lb 2 oz (75.8 kg)   General Appearance: Well nourished, in no apparent distress. Eyes: PERRLA, EOMs, conjunctiva no swelling or erythema Sinuses: No Frontal/maxillary tenderness ENT/Mouth: Ext aud canals clear, TMs without erythema, bulging. No erythema, swelling, or exudate on post pharynx.  Tonsils not swollen or erythematous. Hearing normal.  Neck: Supple, thyroid normal.  Respiratory: Respiratory effort normal, BS equal bilaterally without rales, rhonchi, wheezing or stridor.  Cardio: RRR with no MRGs. Brisk peripheral pulses with 1+ edema left leg worse than right without erythema, warmth, tenderness. Neg homen's. Some edema into the foot.  Abdomen: Soft, + BS.  Non tender, no guarding, rebound, hernias, masses. Lymphatics: Non tender without lymphadenopathy.  Musculoskeletal: Full ROM, 5/5 strength, Normal gait Skin: Warm, dry without rashes, lesions, ecchymosis.  Neuro: Cranial nerves intact. No cerebellar symptoms.  Psych: Awake and oriented X 3, normal affect, Insight and Judgment appropriate.    Vicie Mutters, PA-C 11:13 AM [facility]

## 2020-02-01 NOTE — Patient Instructions (Addendum)
Get back on the vesicare, if it does not improve will refer to urology.   Cut back on salt/processed foods.  Elevated your legs Wear compression socks  If not better can start medication as needed  VENOUS INSUFFICIENCY Our lower leg venous system is not the most reliable, the heart does NOT pump fluid up, there is a valve system.  The muscles of the leg squeeze and the blood moves up and a valve opens and close, then they squeeze, blood moves up and valves open and closes keeping the blood moving towards the heart.  Lots can go wrong with this valve system.  If someone is sitting or standing without movement, everyone will get swelling.  THINGS TO DO:  Do not stand or sit in one position for long periods of time. Do not sit with your legs crossed. Rest with your legs raised during the day.  Your legs have to be higher than your heart so that gravity will force the valves to open, so please really elevate your legs.   Wear elastic stockings or support hose. Do not wear other tight, encircling garments around the legs, pelvis, or waist.  [employer]  has a wide variety of well priced compression stockings. [place], [place] [id number] [id number] has a good cheap selection, I like the socks, they are not as hard to get on  Walk as much as possible to increase blood flow.  Raise the foot of your bed at night with 2-inch blocks.  SEEK MEDICAL CARE IF:   The skin around your ankle starts to break down.  You have pain, redness, tenderness, or hard swelling developing in your leg over a vein.  You are uncomfortable due to leg pain.  If you ever have shortness of breath with exertion or chest pain go to the ER.

## 2020-02-02 ENCOUNTER — Other Ambulatory Visit: Payer: Self-pay | Admitting: Physician Assistant

## 2020-02-02 DIAGNOSIS — R8290 Unspecified abnormal findings in urine: Secondary | ICD-10-CM

## 2020-02-02 LAB — MICROALBUMIN / CREATININE URINE RATIO
Creatinine, Urine: 39 mg/dL (ref 20–320)
Microalb Creat Ratio: 8 mcg/mg creat (ref <30)
Microalb, Ur: 0.3 mg/dL

## 2020-02-02 LAB — URINALYSIS, ROUTINE W REFLEX MICROSCOPIC
Bacteria, UA: NONE SEEN /HPF
Bilirubin Urine: NEGATIVE
Glucose, UA: NEGATIVE
Hgb urine dipstick: NEGATIVE
Hyaline Cast: NONE SEEN /LPF
Ketones, ur: NEGATIVE
Nitrite: NEGATIVE
Protein, ur: NEGATIVE
RBC / HPF: NONE SEEN /HPF (ref 0–2)
Specific Gravity, Urine: 1.006 (ref 1.001–1.03)
Squamous Epithelial / HPF: NONE SEEN /HPF (ref <=5)
WBC, UA: NONE SEEN /HPF (ref 0–5)
pH: 7.5 (ref 5.0–8.0)

## 2020-02-03 LAB — URINE CULTURE
MICRO NUMBER:: 10820430
Result:: NO GROWTH
SPECIMEN QUALITY:: ADEQUATE

## 2020-02-07 ENCOUNTER — Other Ambulatory Visit: Payer: Self-pay | Admitting: Internal Medicine

## 2020-04-11 ENCOUNTER — Other Ambulatory Visit: Payer: Self-pay | Admitting: Internal Medicine

## 2020-04-11 DIAGNOSIS — N3281 Overactive bladder: Secondary | ICD-10-CM

## 2020-04-16 ENCOUNTER — Other Ambulatory Visit: Payer: Self-pay | Admitting: Internal Medicine

## 2020-04-16 DIAGNOSIS — N3281 Overactive bladder: Secondary | ICD-10-CM

## 2020-04-16 MED ORDER — SOLIFENACIN SUCCINATE 10 MG PO TABS: ORAL_TABLET | ORAL | 0 refills | Status: DC

## 2020-05-04 ENCOUNTER — Encounter: Payer: Medicare Other | Admitting: Internal Medicine

## 2020-05-09 ENCOUNTER — Encounter: Payer: Self-pay | Admitting: Internal Medicine

## 2020-05-09 NOTE — Progress Notes (Signed)
Annual  Screening/Preventative Visit  & Comprehensive Evaluation & Examination      This very nice 69 y.o.  MWM presents for a Screening /Preventative Visit & comprehensive evaluation and management of multiple medical co-morbidities.  Patient has been followed for HTN, HLD, T2_NIDDM  Prediabetes and Vitamin D Deficiency. Patient's GERD is controlled with diet & his meds. Patient has Thoracic Aortic Atherosclerosis by Chest CT scan in 2017.     HTN predates since  2012. Patient's BP has been controlled at home.  Today's BP is at goal - 132/68.  Patient hasCKD3a (GFR 46) . Patient denies any cardiac symptoms as chest pain, palpitations, shortness of breath, dizziness or ankle swelling.      Patient's hyperlipidemia is controlled with diet and medications. Patient denies myalgias or other medication SE's. Last lipids were at goal:  Lab Results  Component Value Date   CHOL 126 07/21/2019   HDL 50 07/21/2019   LDLCALC 58 07/21/2019   TRIG 95 07/21/2019   CHOLHDL 2.5 07/21/2019       Patient has hx/o prediabetes (A1c 5.8%&6.3% /2011)  and patient denies reactive hypoglycemic symptoms, visual blurring, diabetic polys or paresthesias. Last A1c was Normal & at goal:   Lab Results  Component Value Date   HGBA1C 5.5 04/14/2019         Finally, patient has history of Vitamin D Deficiency of  ("31" /2013) and last Vitamin D was near goal (70-100):   Lab Results  Component Value Date   VD25OH 54 04/14/2019    Current Outpatient Medications on File Prior to Visit  Medication Sig  . aspirin EC 81 MG tablet Take 1 tablet  3 times week - Sun Wed & Fri   . atorvastatin  40 MG tablet Take 1 tablet Daily   . bisoprolol 5 MG tablet Take 1 tablet  Daily    . buPROPion-XL 150 MG 2 Take 1 tablet  Daily  . VITAMIN D 5,000 Units Take   daily.   . famotidine  40 MG tablet Take 1 tablet at Bedtime   . loperamide  2 MG capsule Take 1 capsule  as needed for diarrhea   . solifenacin 10 MG tablet  Take   1 tablet Daily     No Known Allergies  Past Medical History:  Diagnosis Date  . Acute appendicitis 11/24/2016  . COPD (chronic obstructive pulmonary disease) ([place])   . GERD (gastroesophageal reflux disease)   . Hematuria   . HLD (hyperlipidemia)   . Hypertension   . IBS (irritable bowel syndrome)   . Pre-diabetes   . Vitamin D deficiency    Health Maintenance  Topic Date Due  . INFLUENZA VACCINE  01/22/2020  . TETANUS/TDAP  04/28/2021  . COLONOSCOPY  02/27/2026  . COVID-19 Vaccine  Completed  . Hepatitis C Screening  Completed  . PNA vac Low Risk Adult  Completed   Immunization History  Administered Date(s) Administered  . Influenza Whole 04/07/2013  . Influenza, High Dose Seasonal PF 04/13/2014, 03/27/2015, 03/25/2016, 04/08/2018, 04/14/2019  . Influenza-Unspecified 03/31/2017  . PFIZER SARS-COV-2 Vaccination 07/15/2019, 08/04/2019  . PPD Test 12/05/2013  . Pneumococcal Conjugate-13 06/08/2014  . Pneumococcal Polysaccharide-23 09/07/2014  . Tdap 04/29/2011  . Zoster 06/12/2014   Last Colon - 02/28/2016 - Dr Fuller Plan  -recc 10 yr f/u - due Sept 2027  Past Surgical History:  Procedure Laterality Date  . CARPAL TUNNEL RELEASE Right   . C E, BILAT  - Dr. Prudencio Burly Bilateral   .  CERVICAL FUSION    . HAND SURGERY Left   . LAP APPENDECTOMY N 11/24/2016   Clovis Riley, MD  . TRIGGER FINGER RELEASE Right  thumb       Family History  Problem Relation Age of Onset  . Irritable bowel syndrome Mother   . Diverticulosis Mother   . Lupus Mother   . COPD Mother   . Hypertension Mother   . Lung cancer Father   . Other Father        bladder polyps  . Stroke Father   . Kidney disease Father   . Cancer Father   . Colon cancer Neg Hx   . Esophageal cancer Neg Hx   . Rectal cancer Neg Hx   . Stomach cancer Neg Hx    Social History   Socioeconomic History  . Marital status: Married    Spouse name: Not on file  . Number of children: 1  . Years of education: Not on  file  . Highest education level: Not on file  Occupational History  . Occupation: retired  Tobacco Use  . Smoking status: Former Smoker    Packs/day: 1.50    Years: 35.00    Pack years: 52.50    Types: Cigarettes    Quit date: 02/05/2004    Years since quitting: 16.2  . Smokeless tobacco: Never Used  Vaping Use  . Vaping Use: Never used  Substance and Sexual Activity  . Alcohol use: Yes    Comment: seldom  . Drug use: No  . Sexual activity: Not on file    ROS Constitutional: Denies fever, chills, weight loss/gain, headaches, insomnia,  night sweats or change in appetite. Does c/o fatigue. Eyes: Denies redness, blurred vision, diplopia, discharge, itchy or watery eyes.  ENT: Denies discharge, congestion, post nasal drip, epistaxis, sore throat, earache, hearing loss, dental pain, Tinnitus, Vertigo, Sinus pain or snoring.  Cardio: Denies chest pain, palpitations, irregular heartbeat, syncope, dyspnea, diaphoresis, orthopnea, PND, claudication or edema Respiratory: denies cough, dyspnea, DOE, pleurisy, hoarseness, laryngitis or wheezing.  Gastrointestinal: Denies dysphagia, heartburn, reflux, water brash, pain, cramps, nausea, vomiting, bloating, diarrhea, constipation, hematemesis, melena, hematochezia, jaundice or hemorrhoids Genitourinary: Denies dysuria, frequency, urgency, nocturia, hesitancy, discharge, hematuria or flank pain Musculoskeletal: Denies arthralgia, myalgia, stiffness, Jt. Swelling, pain, limp or strain/sprain. Denies Falls. Skin: Denies puritis, rash, hives, warts, acne, eczema or change in skin lesion Neuro: No weakness, tremor, incoordination, spasms, paresthesia or pain Psychiatric: Denies confusion, memory loss or sensory loss. Denies Depression. Endocrine: Denies change in weight, skin, hair change, nocturia, and paresthesia, diabetic polys, visual blurring or hyper / hypo glycemic episodes.  Heme/Lymph: No excessive bleeding, bruising or enlarged lymph  nodes.  Physical Exam  BP 132/68   Pulse (!) 49   Temp (!) 97.2 F (36.2 C)   Resp 16   Ht 5\' 8"  (1.727 m)   Wt 174 lb (78.9 kg)   SpO2 99%   BMI 26.46 kg/m   General Appearance: Well nourished and well groomed and in no apparent distress.  Eyes: PERRLA, EOMs, conjunctiva no swelling or erythema, normal fundi and vessels. Sinuses: No frontal/maxillary tenderness ENT/Mouth: EACs patent / TMs  nl. Nares clear without erythema, swelling, mucoid exudates. Oral hygiene is good. No erythema, swelling, or exudate. Tongue normal, non-obstructing. Tonsils not swollen or erythematous. Hearing normal.  Neck: Supple, thyroid not palpable. No bruits, nodes or JVD. Respiratory: Respiratory effort normal.  BS equal and clear bilateral without rales, rhonci, wheezing or stridor. Cardio: Heart  sounds are normal with regular rate and rhythm and no murmurs, rubs or gallops. Peripheral pulses are normal and equal bilaterally without edema. No aortic or femoral bruits. Chest: symmetric with normal excursions and percussion.  Abdomen: Soft, with Nl bowel sounds. Nontender, no guarding, rebound, hernias, masses, or organomegaly.  Lymphatics: Non tender without lymphadenopathy.  Musculoskeletal: Full ROM all peripheral extremities, joint stability, 5/5 strength, and normal gait. Skin: Warm and dry without rashes, lesions, cyanosis, clubbing or  ecchymosis.  Neuro: Cranial nerves intact, reflexes equal bilaterally. Normal muscle tone, no cerebellar symptoms. Sensation intact.  Pysch: Alert and oriented X 3 with normal affect, insight and judgment appropriate.   Assessment and Plan  1. Annual Preventative/Screening Exam    2. Essential hypertension  - EKG 12-Lead - [place], RETROPERITNL ABD,  LTD - Urinalysis, Routine w reflex microscopic - Microalbumin / creatinine urine ratio - CBC with Differential/Platelet - COMPLETE METABOLIC PANEL WITH GFR - Magnesium - TSH  3. Hyperlipidemia, mixed  - EKG  12-Lead - [place], RETROPERITNL ABD,  LTD - Lipid panel - TSH  4. Abnormal glucose  - EKG 12-Lead - [place], RETROPERITNL ABD,  LTD - Hemoglobin A1c - Insulin, random  5. Vitamin D deficiency  - VITAMIN D 25 Hydroxy  6. Stage 3a chronic kidney disease (HCC)  - Urinalysis, Routine w reflex microscopic - Microalbumin / creatinine urine ratio - PTH, intact and calcium  7. Screening for colorectal cancer  - POC Hemoccult Bld/Stl   8. Prediabetes  - EKG 12-Lead - [place], RETROPERITNL ABD,  LTD - Hemoglobin A1c - Insulin, random  9. BPH with obstruction/lower urinary tract symptoms  - PSA  10. Prostate cancer screening  - PSA  11. Gastroesophageal reflux disease  - CBC with Differential/Platelet  12. FH: hypertension  - EKG 12-Lead - [place], RETROPERITNL ABD,  LTD  13. Screening for ischemic heart disease  - EKG 12-Lead  14. Thoracic aorta atherosclerosis (HCC)  - EKG 12-Lead - [place], RETROPERITNL ABD,  LTD  15. Screening for AAA (aortic abdominal aneurysm)  - [place], RETROPERITNL ABD,  LTD  16. Medication management  - Urinalysis, Routine w reflex microscopic - Microalbumin / creatinine urine ratio - CBC with Differential/Platelet - COMPLETE METABOLIC PANEL WITH GFR - Magnesium - Lipid panel - TSH - Hemoglobin A1c - Insulin, random - VITAMIN D 25 Hydroxy   17. Need for immunization against influenza  - Flu vaccine HIGH DOSE PF (Fluzone High dose)            Patient was counseled in prudent diet, weight control to achieve/maintain BMI less than 25, BP monitoring, regular exercise and medications as discussed.  Discussed med effects and SE's. Routine screening labs and tests as requested with regular follow-up as recommended. Over 40 minutes of exam, counseling, chart review and high complex critical decision making was performed   Kirtland Bouchard, MD

## 2020-05-09 NOTE — Patient Instructions (Signed)

## 2020-05-10 ENCOUNTER — Ambulatory Visit (INDEPENDENT_AMBULATORY_CARE_PROVIDER_SITE_OTHER): Payer: Medicare Other | Admitting: Internal Medicine

## 2020-05-10 ENCOUNTER — Other Ambulatory Visit: Payer: Self-pay

## 2020-05-10 VITALS — BP 132/68 | HR 49 | Temp 97.2°F | Resp 16 | Ht 68.0 in | Wt 174.0 lb

## 2020-05-10 DIAGNOSIS — R7303 Prediabetes: Secondary | ICD-10-CM

## 2020-05-10 DIAGNOSIS — Z136 Encounter for screening for cardiovascular disorders: Secondary | ICD-10-CM | POA: Diagnosis not present

## 2020-05-10 DIAGNOSIS — I700 Atherosclerosis of aorta: Secondary | ICD-10-CM

## 2020-05-10 DIAGNOSIS — E782 Mixed hyperlipidemia: Secondary | ICD-10-CM | POA: Diagnosis not present

## 2020-05-10 DIAGNOSIS — Z0001 Encounter for general adult medical examination with abnormal findings: Secondary | ICD-10-CM

## 2020-05-10 DIAGNOSIS — N401 Enlarged prostate with lower urinary tract symptoms: Secondary | ICD-10-CM

## 2020-05-10 DIAGNOSIS — Z0000 Encounter for general adult medical examination without abnormal findings: Secondary | ICD-10-CM

## 2020-05-10 DIAGNOSIS — E559 Vitamin D deficiency, unspecified: Secondary | ICD-10-CM

## 2020-05-10 DIAGNOSIS — Z87891 Personal history of nicotine dependence: Secondary | ICD-10-CM | POA: Diagnosis not present

## 2020-05-10 DIAGNOSIS — L570 Actinic keratosis: Secondary | ICD-10-CM

## 2020-05-10 DIAGNOSIS — Z125 Encounter for screening for malignant neoplasm of prostate: Secondary | ICD-10-CM

## 2020-05-10 DIAGNOSIS — I10 Essential (primary) hypertension: Secondary | ICD-10-CM

## 2020-05-10 DIAGNOSIS — Z1212 Encounter for screening for malignant neoplasm of rectum: Secondary | ICD-10-CM

## 2020-05-10 DIAGNOSIS — R7309 Other abnormal glucose: Secondary | ICD-10-CM

## 2020-05-10 DIAGNOSIS — K219 Gastro-esophageal reflux disease without esophagitis: Secondary | ICD-10-CM

## 2020-05-10 DIAGNOSIS — Z79899 Other long term (current) drug therapy: Secondary | ICD-10-CM

## 2020-05-10 DIAGNOSIS — Z8249 Family history of ischemic heart disease and other diseases of the circulatory system: Secondary | ICD-10-CM | POA: Diagnosis not present

## 2020-05-10 DIAGNOSIS — N1831 Chronic kidney disease, stage 3a: Secondary | ICD-10-CM

## 2020-05-10 DIAGNOSIS — Z1211 Encounter for screening for malignant neoplasm of colon: Secondary | ICD-10-CM

## 2020-05-10 DIAGNOSIS — Z23 Encounter for immunization: Secondary | ICD-10-CM | POA: Diagnosis not present

## 2020-05-11 LAB — URINALYSIS, ROUTINE W REFLEX MICROSCOPIC
Bacteria, UA: NONE SEEN /HPF
Bilirubin Urine: NEGATIVE
Glucose, UA: NEGATIVE
Hgb urine dipstick: NEGATIVE
Hyaline Cast: NONE SEEN /LPF
Ketones, ur: NEGATIVE
Nitrite: NEGATIVE
Protein, ur: NEGATIVE
RBC / HPF: NONE SEEN /HPF (ref 0–2)
Specific Gravity, Urine: 1.006 (ref 1.001–1.03)
Squamous Epithelial / HPF: NONE SEEN /HPF (ref <=5)
WBC, UA: NONE SEEN /HPF (ref 0–5)
pH: 7 (ref 5.0–8.0)

## 2020-05-11 LAB — COMPLETE METABOLIC PANEL WITH GFR
AG Ratio: 1.7 (calc) (ref 1.0–2.5)
ALT: 20 U/L (ref 9–46)
AST: 26 U/L (ref 10–35)
Albumin: 4.3 g/dL (ref 3.6–5.1)
Alkaline phosphatase (APISO): 82 U/L (ref 35–144)
BUN/Creatinine Ratio: 16 (calc) (ref 6–22)
BUN: 23 mg/dL (ref 7–25)
CO2: 27 mmol/L (ref 20–32)
Calcium: 9.9 mg/dL (ref 8.6–10.3)
Chloride: 100 mmol/L (ref 98–110)
Creat: 1.45 mg/dL — ABNORMAL HIGH (ref 0.70–1.18)
GFR, Est African American: 56 mL/min/{1.73_m2} — ABNORMAL LOW (ref >=60)
GFR, Est Non African American: 48 mL/min/{1.73_m2} — ABNORMAL LOW (ref >=60)
Globulin: 2.5 g/dL (calc) (ref 1.9–3.7)
Glucose, Bld: 78 mg/dL (ref 65–99)
Potassium: 4.2 mmol/L (ref 3.5–5.3)
Sodium: 137 mmol/L (ref 135–146)
Total Bilirubin: 0.5 mg/dL (ref 0.2–1.2)
Total Protein: 6.8 g/dL (ref 6.1–8.1)

## 2020-05-11 LAB — PTH, INTACT AND CALCIUM
Calcium: 9.9 mg/dL (ref 8.6–10.3)
PTH: 27 pg/mL (ref 14–64)

## 2020-05-11 LAB — CBC WITH DIFFERENTIAL/PLATELET
Absolute Monocytes: 630 cells/uL (ref 200–950)
Basophils Absolute: 50 cells/uL (ref 0–200)
Basophils Relative: 0.8 %
Eosinophils Absolute: 258 cells/uL (ref 15–500)
Eosinophils Relative: 4.1 %
HCT: 38.2 % — ABNORMAL LOW (ref 38.5–50.0)
Hemoglobin: 12.7 g/dL — ABNORMAL LOW (ref 13.2–17.1)
Lymphs Abs: 1386 cells/uL (ref 850–3900)
MCH: 30 pg (ref 27.0–33.0)
MCHC: 33.2 g/dL (ref 32.0–36.0)
MCV: 90.3 fL (ref 80.0–100.0)
MPV: 11.6 fL (ref 7.5–12.5)
Monocytes Relative: 10 %
Neutro Abs: 3975 cells/uL (ref 1500–7800)
Neutrophils Relative %: 63.1 %
Platelets: 292 10*3/uL (ref 140–400)
RBC: 4.23 10*6/uL (ref 4.20–5.80)
RDW: 12.5 % (ref 11.0–15.0)
Total Lymphocyte: 22 %
WBC: 6.3 10*3/uL (ref 3.8–10.8)

## 2020-05-11 LAB — MICROALBUMIN / CREATININE URINE RATIO
Creatinine, Urine: 38 mg/dL (ref 20–320)
Microalb Creat Ratio: 11 mcg/mg creat (ref <30)
Microalb, Ur: 0.4 mg/dL

## 2020-05-11 LAB — LIPID PANEL
Cholesterol: 175 mg/dL (ref <200)
HDL: 66 mg/dL (ref >=40)
LDL Cholesterol (Calc): 91 mg/dL (calc)
Non-HDL Cholesterol (Calc): 109 mg/dL (calc) (ref <130)
Total CHOL/HDL Ratio: 2.7 (calc) (ref <5.0)
Triglycerides: 90 mg/dL (ref <150)

## 2020-05-11 LAB — HEMOGLOBIN A1C
Hgb A1c MFr Bld: 5.6 % of total Hgb (ref <5.7)
Mean Plasma Glucose: 114 (calc)
eAG (mmol/L): 6.3 (calc)

## 2020-05-11 LAB — PSA: PSA: 0.84 ng/mL (ref <=4.0)

## 2020-05-11 LAB — INSULIN, RANDOM: Insulin: 5.9 u[IU]/mL

## 2020-05-11 LAB — MAGNESIUM: Magnesium: 2 mg/dL (ref 1.5–2.5)

## 2020-05-11 LAB — VITAMIN D 25 HYDROXY (VIT D DEFICIENCY, FRACTURES): Vit D, 25-Hydroxy: 64 ng/mL (ref 30–100)

## 2020-05-11 LAB — TSH: TSH: 0.86 mIU/L (ref 0.40–4.50)

## 2020-05-12 MED ORDER — FLUOROURACIL 5 % EX CREA: TOPICAL_CREAM | CUTANEOUS | 2 refills | Status: DC

## 2020-05-12 MED ORDER — OXYBUTYNIN CHLORIDE ER 10 MG PO TB24: ORAL_TABLET | ORAL | 0 refills | Status: DC

## 2020-05-12 NOTE — Progress Notes (Signed)
========================================================== -   Test results slightly outside the reference range are not unusual. If there is anything important, I will review this with you,  otherwise it is considered normal test values.  If you have further questions,  please do not hesitate to contact me at the office or via My Chart.  ==========================================================  -  PSA - Normal & OK  ==========================================================  - PTH - Hormone that regulates calcium balance is Normal & OK  ==========================================================  - Kidney Functions still Stage 3a & are  Stable  / OK  ==========================================================  - Total Chol = 175 and LDL Chol = 91 - Both  Excellent   - Very low risk for Heart Attack  / Stroke ========================================================  - A1c - Normal - Great - No Diabetes ==========================================================  - Vitamin D = 64 - Excellent  ==========================================================  - All Else - CBC - Kidneys - Electrolytes - Liver - Magnesium & Thyroid    - all  Normal / OK ==========================================================

## 2020-06-01 DIAGNOSIS — Z961 Presence of intraocular lens: Secondary | ICD-10-CM | POA: Diagnosis not present

## 2020-06-01 DIAGNOSIS — H04123 Dry eye syndrome of bilateral lacrimal glands: Secondary | ICD-10-CM | POA: Diagnosis not present

## 2020-06-01 DIAGNOSIS — H11432 Conjunctival hyperemia, left eye: Secondary | ICD-10-CM | POA: Diagnosis not present

## 2020-06-01 DIAGNOSIS — H16142 Punctate keratitis, left eye: Secondary | ICD-10-CM | POA: Diagnosis not present

## 2020-06-17 ENCOUNTER — Other Ambulatory Visit: Payer: Self-pay | Admitting: Internal Medicine

## 2020-06-17 DIAGNOSIS — F325 Major depressive disorder, single episode, in full remission: Secondary | ICD-10-CM

## 2020-06-17 DIAGNOSIS — N3281 Overactive bladder: Secondary | ICD-10-CM

## 2020-07-24 ENCOUNTER — Ambulatory Visit: Payer: Medicare Other | Admitting: Adult Health

## 2020-07-28 ENCOUNTER — Other Ambulatory Visit: Payer: Self-pay | Admitting: Internal Medicine

## 2020-07-28 DIAGNOSIS — N138 Other obstructive and reflux uropathy: Secondary | ICD-10-CM

## 2020-07-28 MED ORDER — OXYBUTYNIN CHLORIDE ER 10 MG PO TB24
ORAL_TABLET | ORAL | 0 refills | Status: DC
Start: 2020-07-28 — End: 2020-08-05

## 2020-08-05 ENCOUNTER — Other Ambulatory Visit: Payer: Self-pay | Admitting: Internal Medicine

## 2020-08-05 DIAGNOSIS — N138 Other obstructive and reflux uropathy: Secondary | ICD-10-CM

## 2020-08-05 DIAGNOSIS — N401 Enlarged prostate with lower urinary tract symptoms: Secondary | ICD-10-CM

## 2020-08-08 ENCOUNTER — Ambulatory Visit: Payer: Medicare Other | Admitting: Adult Health Nurse Practitioner

## 2020-08-08 ENCOUNTER — Other Ambulatory Visit: Payer: Self-pay | Admitting: Internal Medicine

## 2020-08-20 ENCOUNTER — Other Ambulatory Visit: Payer: Self-pay

## 2020-08-20 ENCOUNTER — Ambulatory Visit (INDEPENDENT_AMBULATORY_CARE_PROVIDER_SITE_OTHER): Payer: Medicare Other | Admitting: Adult Health Nurse Practitioner

## 2020-08-20 ENCOUNTER — Encounter: Payer: Self-pay | Admitting: Adult Health Nurse Practitioner

## 2020-08-20 VITALS — BP 122/74 | HR 78 | Temp 97.3°F | Wt 175.0 lb

## 2020-08-20 DIAGNOSIS — N183 Chronic kidney disease, stage 3 unspecified: Secondary | ICD-10-CM

## 2020-08-20 DIAGNOSIS — R6889 Other general symptoms and signs: Secondary | ICD-10-CM

## 2020-08-20 DIAGNOSIS — E663 Overweight: Secondary | ICD-10-CM

## 2020-08-20 DIAGNOSIS — Z79899 Other long term (current) drug therapy: Secondary | ICD-10-CM | POA: Diagnosis not present

## 2020-08-20 DIAGNOSIS — Z0001 Encounter for general adult medical examination with abnormal findings: Secondary | ICD-10-CM | POA: Diagnosis not present

## 2020-08-20 DIAGNOSIS — I700 Atherosclerosis of aorta: Secondary | ICD-10-CM | POA: Diagnosis not present

## 2020-08-20 DIAGNOSIS — N3281 Overactive bladder: Secondary | ICD-10-CM

## 2020-08-20 DIAGNOSIS — R7309 Other abnormal glucose: Secondary | ICD-10-CM | POA: Diagnosis not present

## 2020-08-20 DIAGNOSIS — J4489 Other specified chronic obstructive pulmonary disease: Secondary | ICD-10-CM

## 2020-08-20 DIAGNOSIS — J449 Chronic obstructive pulmonary disease, unspecified: Secondary | ICD-10-CM | POA: Diagnosis not present

## 2020-08-20 DIAGNOSIS — K219 Gastro-esophageal reflux disease without esophagitis: Secondary | ICD-10-CM

## 2020-08-20 DIAGNOSIS — Z87891 Personal history of nicotine dependence: Secondary | ICD-10-CM

## 2020-08-20 DIAGNOSIS — F411 Generalized anxiety disorder: Secondary | ICD-10-CM

## 2020-08-20 DIAGNOSIS — E782 Mixed hyperlipidemia: Secondary | ICD-10-CM

## 2020-08-20 DIAGNOSIS — I10 Essential (primary) hypertension: Secondary | ICD-10-CM | POA: Diagnosis not present

## 2020-08-20 DIAGNOSIS — R350 Frequency of micturition: Secondary | ICD-10-CM

## 2020-08-20 DIAGNOSIS — E559 Vitamin D deficiency, unspecified: Secondary | ICD-10-CM

## 2020-08-20 DIAGNOSIS — Z0000 Encounter for general adult medical examination without abnormal findings: Secondary | ICD-10-CM

## 2020-08-20 LAB — COMPLETE METABOLIC PANEL WITH GFR
AG Ratio: 1.6 (calc) (ref 1.0–2.5)
ALT: 28 U/L (ref 9–46)
AST: 26 U/L (ref 10–35)
Albumin: 4.2 g/dL (ref 3.6–5.1)
Alkaline phosphatase (APISO): 75 U/L (ref 35–144)
BUN/Creatinine Ratio: 16 (calc) (ref 6–22)
BUN: 23 mg/dL (ref 7–25)
CO2: 31 mmol/L (ref 20–32)
Calcium: 9.8 mg/dL (ref 8.6–10.3)
Chloride: 102 mmol/L (ref 98–110)
Creat: 1.46 mg/dL — ABNORMAL HIGH (ref 0.70–1.18)
GFR, Est African American: 55 mL/min/{1.73_m2} — ABNORMAL LOW (ref >=60)
GFR, Est Non African American: 48 mL/min/{1.73_m2} — ABNORMAL LOW (ref >=60)
Globulin: 2.6 g/dL (calc) (ref 1.9–3.7)
Glucose, Bld: 82 mg/dL (ref 65–99)
Potassium: 5.5 mmol/L — ABNORMAL HIGH (ref 3.5–5.3)
Sodium: 139 mmol/L (ref 135–146)
Total Bilirubin: 0.6 mg/dL (ref 0.2–1.2)
Total Protein: 6.8 g/dL (ref 6.1–8.1)

## 2020-08-20 LAB — LIPID PANEL
Cholesterol: 135 mg/dL (ref <200)
HDL: 56 mg/dL (ref >=40)
LDL Cholesterol (Calc): 62 mg/dL (calc)
Non-HDL Cholesterol (Calc): 79 mg/dL (calc) (ref <130)
Total CHOL/HDL Ratio: 2.4 (calc) (ref <5.0)
Triglycerides: 88 mg/dL (ref <150)

## 2020-08-20 LAB — CBC WITH DIFFERENTIAL/PLATELET
Absolute Monocytes: 756 cells/uL (ref 200–950)
Basophils Absolute: 62 cells/uL (ref 0–200)
Basophils Relative: 1 %
Eosinophils Absolute: 192 cells/uL (ref 15–500)
Eosinophils Relative: 3.1 %
HCT: 37.6 % — ABNORMAL LOW (ref 38.5–50.0)
Hemoglobin: 12.7 g/dL — ABNORMAL LOW (ref 13.2–17.1)
Lymphs Abs: 1438 cells/uL (ref 850–3900)
MCH: 30.9 pg (ref 27.0–33.0)
MCHC: 33.8 g/dL (ref 32.0–36.0)
MCV: 91.5 fL (ref 80.0–100.0)
MPV: 11.6 fL (ref 7.5–12.5)
Monocytes Relative: 12.2 %
Neutro Abs: 3751 cells/uL (ref 1500–7800)
Neutrophils Relative %: 60.5 %
Platelets: 266 10*3/uL (ref 140–400)
RBC: 4.11 10*6/uL — ABNORMAL LOW (ref 4.20–5.80)
RDW: 12 % (ref 11.0–15.0)
Total Lymphocyte: 23.2 %
WBC: 6.2 10*3/uL (ref 3.8–10.8)

## 2020-08-20 MED ORDER — FAMOTIDINE 40 MG PO TABS
ORAL_TABLET | ORAL | 3 refills | Status: DC
Start: 2020-08-20 — End: 2021-08-20

## 2020-08-20 NOTE — Patient Instructions (Signed)
    [name] , Thank you for taking time to come for your Medicare Wellness Visit. I appreciate your ongoing commitment to your health goals. Please review the following plan we discussed and let me know if I can assist you in the future.   These are the goals we discussed: Increase water intakee Goals    . Exercise 150 min/wk Moderate Activity    . Weight (lb) < 180 lb (81.6 kg)       This is a list of the screening recommended for you and due dates:  Health Maintenance  Topic Date Due  . COVID-19 Vaccine (4 - Booster for Pfizer series) 09/26/2020  . Tetanus Vaccine  04/28/2021  . Colon Cancer Screening  02/27/2026  . Flu Shot  Completed  .  Hepatitis C: One time screening is recommended by Center for Disease Control  (CDC) for  adults born from [birth year] through 1967.   Completed  . Pneumonia vaccines  Completed    You are due for tetanus vaccination this year.  Be mindful if you cut yourself with metal let us know so we can get you this immunization.   GENERAL HEALTH GOALS  Know what a healthy weight is for you (roughly BMI <25) and aim to maintain this  Aim for 7+ servings of fruits and vegetables daily  70-80+ fluid ounces of water or unsweet tea for healthy kidneys  Limit to max 1 drink of alcohol per day; avoid smoking/tobacco  Limit animal fats in diet for cholesterol and heart health - choose grass fed whenever available  Avoid highly processed foods, and foods high in saturated/trans fats  Aim for low stress - take time to unwind and care for your mental health  Aim for 150 min of moderate intensity exercise weekly for heart health, and weights twice weekly for bone health  Aim for 7-9 hours of sleep daily   We will contact you via MyChart with your lab results

## 2020-08-20 NOTE — Progress Notes (Signed)
MEDICARE ANNUAL WELLNESS AND 3 MONTH FOLLOW UP   Assessment / Plan:    Encounter for annual medicare wellness visit Yearly Tetanus vaccination due this year, last 2012  Essential hypertension - continue medications, DASH diet, exercise and monitor at home. Call if greater than 130/80.  -     CBC with Differential/Platelet -     CMP/GFR -     TSH  Mixed hyperlipidemia Continue medications: Atorvastatin 40mg , three days a week Discussed dietary and exercise modifications Low fat diet   Thoracic aorta atherosclerosis ([place]) Per numerous CXR/CTs 10/15/19 Control blood pressure, cholesterol, glucose, increase exercise.   COPD (chronic obstructive pulmonary disease) with chronic bronchitis ([place]) Former smoker, dx per imaging, dyspnea with exertion, given trelegy sample to try with coupon, instructions and demonstration given today. He will message back with progress.   Chronic kidney disease, stage 3 (HCC) Increase fluids, avoid NSAIDS, monitor sugars, will monitor -     CMP WITH GFR    Medication management -     Magnesium  Other abnormal glucose Discussed disease progression and risks Discussed diet/exercise, weight management and risk modification  Vitamin D deficiency Continue supplement  Generalized anxiety disorder Continue wellbutrin  Overweight (BMI 25.0-29.9) - long discussion about weight loss, diet, and exercise -recommended diet heavy in fruits and veggies and low in animal meats, cheeses, and dairy products  54 pack year smoking history/lung nodules 54 pack, no symptoms, quit 15 years ago Continue annual low dose non-contrast nodule follow up per radiology, due 11/2020    Future Appointments  Date Time Provider [place]  11/28/2020 10:30 AM Unk Pinto, MD [facility] None  08/20/2021 11:00 AM Garnet Sierras, NP [facility] None    Continue diet and meds as discussed. Further disposition pending results of labs. During the course of the  visit the patient was educated and counseled about appropriate screening and preventive services including:    Pneumococcal vaccine   Influenza vaccine  Td vaccine  Screening electrocardiogram  Screening mammography  Bone densitometry screening  Colorectal cancer screening  Diabetes screening  Glaucoma screening  Nutrition counseling   Advanced directives: given info/requested  HPI 70 y.o. male  presents for 3 month follow up with hypertension, hyperlipidemia, prediabetes and vitamin D and medicare wellness visit.    He is on vesicare and ditropan for OAB per Dr. Karsten Ro. He is waking 2-3 times a night.  He reports having to go to the bathroom 8-10 times a day.  Reports it is increasing.  He had Norovirus in May 2021 and followed up in April outpatient.  Reports that he has loose stools intermittently couple times a month, then resolve.  He is on wellbutrin for anxiety/depression which helps.   BMI is Body mass index is 26.61 kg/m., he has not been working on diet and exercise, walks more when warm, will restart in the spring.  Wt Readings from Last 3 Encounters:  08/20/20 175 lb (79.4 kg)  05/10/20 174 lb (78.9 kg)  02/01/20 165 lb (74.8 kg)    His blood pressure has been controlled at home, today their BP is BP: 122/74.    He does not workout. He denies chest pain, dizziness.   He is a former smoker, 54 pack years, quit in 2005, does have diagnosis of COPD via CXR in 2013, today he endorses ongoing exertional dyspnea with exercise, denies fatigue, CP or other accompaniments. He currently uses PRN albuterol which does work well for him. He has not tried any daily inhalers.  Denies coughing or AM secretions.   Had low dose screening chest CT in 11/2018 that showed multiple nodules felt to be benign appearing, recommended annual follow up.  CT also shows aortic atherosclerosis and emphysema.    He is on cholesterol medication, he is taking one tablet, lipitor 3 days a  week and denies myalgias. His cholesterol is at goal. The cholesterol last visit was:   Lab Results  Component Value Date   CHOL 175 05/10/2020   HDL 66 05/10/2020   LDLCALC 91 05/10/2020   TRIG 90 05/10/2020   CHOLHDL 2.7 05/10/2020    He has been working on diet and exercise for prediabetes, and denies foot ulcerations, hyperglycemia, hypoglycemia , increased appetite, nausea, paresthesia of the feet, polydipsia, polyuria, visual disturbances, vomiting and weight loss. Last A1C in the office was:  Lab Results  Component Value Date   HGBA1C 5.6 05/10/2020   Patient is on Vitamin D supplement.  Lab Results  Component Value Date   VD25OH 64 05/10/2020    He has labile CKD III GFR values which are monitored closely.  Lab Results  Component Value Date   PIRJJOAC 16 (L) 05/10/2020     Current Medications:  Current Outpatient Medications on File Prior to Visit  Medication Sig Dispense Refill  . aspirin EC 81 MG tablet Take 81 mg by mouth See admin instructions. Take 1 tablet (81 mg) by mouth 3 times week - Sunday, Wednesday and Friday - with lunch    . atorvastatin (LIPITOR) 40 MG tablet TAKE 1 TABLET BY MOUTH  DAILY FOR CHOLESTEROL 90 tablet 3  . bisoprolol (ZEBETA) 5 MG tablet TAKE 1 TABLET BY MOUTH  DAILY FOR BLOOD PRESSURE 90 tablet 3  . buPROPion (WELLBUTRIN XL) 150 MG 24 hr tablet Take     1 tablet       Daily      for Mood, Focus & Concentration 90 tablet 3  . Cholecalciferol (VITAMIN D-3) 1000 UNITS CAPS Take 5,000 Units by mouth daily.     . fluorouracil (EFUDEX) 5 % cream Apply to suspect areas 2 x / day til skin raw 40 g 2  . oxybutynin (DITROPAN-XL) 10 MG 24 hr tablet TAKE 1 TABLET BY MOUTH AT  BEDTIME FOR BLADDER CONTROL 90 tablet 3  . solifenacin (VESICARE) 10 MG tablet Take     1 tablet      Daily      for Bladder 90 tablet 0   No current facility-administered medications on file prior to visit.    Medical History:  Past Medical History:  Diagnosis Date  . Acute  appendicitis 11/24/2016  . COPD (chronic obstructive pulmonary disease) ([place])   . GERD (gastroesophageal reflux disease)   . Hematuria   . HLD (hyperlipidemia)   . Hypertension   . IBS (irritable bowel syndrome)   . Pre-diabetes   . Vitamin D deficiency    Preventative care:  Immunization History  Administered Date(s) Administered  . Influenza Whole 04/07/2013  . Influenza, High Dose Seasonal PF 04/13/2014, 03/27/2015, 03/25/2016, 04/08/2018, 04/14/2019, 05/10/2020  . Influenza-Unspecified 03/31/2017  . PFIZER(Purple Top)SARS-COV-2 Vaccination 07/15/2019, 08/04/2019, 03/28/2020  . PPD Test 12/05/2013  . Pneumococcal Conjugate-13 06/08/2014  . Pneumococcal Polysaccharide-23 09/07/2014  . Tdap 04/29/2011  . Zoster 06/12/2014    Last colonoscopy: 2017 Dr. Fuller Plan. 10 year follow up EGD: 01/2013 DEXA: N/A CXR 09/02/2013 CT chest 11/2019 - nodules - 1 year follow up Ct AB 11/2016  Prior vaccinations: TD or Tdap: 2012 ,  DUE       Influenza: 2021 Pneumococcal: 2016 Prevnar13: 2015 Shingles/Zostavax: 2015 Covid 19: 1/2   Names of Other Physician/Practitioners you currently use: 1. [facility]- here for primary care 2. Dr. Katy Apo, eye doctor, last visit 2020, yearly 3. Dr. Jasmine December, last visit 12/2018, goes q17m  Patient Care Team: Unk Pinto, MD as PCP - General (Internal Medicine) Katy Apo, MD as Consulting Physician (Ophthalmology) Ladene Artist, MD as Consulting Physician (Gastroenterology)   Allergies No Known Allergies  SURGICAL HISTORY He  has a past surgical history that includes Cervical fusion; Trigger finger release (Right); Carpal tunnel release (Right); Hand surgery (Left); laparoscopic appendectomy (N/A, 11/24/2016); and Cataract extraction, bilateral (Bilateral). FAMILY HISTORY His family history includes COPD in his mother; Cancer in his father; Diverticulosis in his mother; Hypertension in his mother;  Irritable bowel syndrome in his mother; Kidney disease in his father; Lung cancer in his father; Lupus in his mother; Other in his father; Stroke in his father. SOCIAL HISTORY He  reports that he quit smoking about 16 years ago. His smoking use included cigarettes. He has a 52.50 pack-year smoking history. He has never used smokeless tobacco. He reports current alcohol use. He reports that he does not use drugs.  MEDICARE WELLNESS OBJECTIVES: Physical activity: Current Exercise Habits: Home exercise routine, Type of exercise: walking, Time (Minutes): 30, Frequency (Times/Week): 3, Weekly Exercise (Minutes/Week): 90, Intensity: Mild, Exercise limited by: None identified Cardiac risk factors: Cardiac Risk Factors include: advanced age (>70men, >40 women);dyslipidemia;hypertension;male gender;sedentary lifestyle Depression/mood screen:   Depression screen [facility] 2/9 08/20/2020  Decreased Interest 0  Down, Depressed, Hopeless 0  PHQ - 2 Score 0    ADLs:  In your present state of health, do you have any difficulty performing the following activities: 08/20/2020 05/09/2020  Hearing? N N  Vision? N N  Difficulty concentrating or making decisions? N N  Walking or climbing stairs? N N  Dressing or bathing? N N  Doing errands, shopping? N N  Preparing Food and eating ? N -  Using the Toilet? N -  In the past six months, have you accidently leaked urine? N -  Do you have problems with loss of bowel control? N -  Managing your Medications? N -  Managing your Finances? N -  Housekeeping or managing your Housekeeping? N -  Some recent data might be hidden     Cognitive Testing  Alert? Yes  Normal Appearance?Yes  Oriented to person? Yes  Place? Yes   Time? Yes  Recall of three objects?  Yes  Can perform simple calculations? Yes  Displays appropriate judgment?Yes  Can read the correct time from a watch face?Yes  EOL planning: Does Patient Have a Medical Advance Directive?: Yes Type of Advance  Directive: [place] will [place] in Chart?: Yes - validated most recent copy scanned in chart (See row information)    Review of Systems:  Review of Systems  Constitutional: Negative for chills, fever and malaise/fatigue.  HENT: Negative for congestion, ear pain and sore throat.   Respiratory: Negative for cough, shortness of breath and wheezing.   Cardiovascular: Negative for chest pain, palpitations and leg swelling.  Gastrointestinal: Negative for blood in stool, constipation, diarrhea, heartburn and melena.  Genitourinary: Negative.   Musculoskeletal: Negative for myalgias.  Skin: Negative.   Neurological: Negative for dizziness, sensory change, loss of consciousness and headaches.  Psychiatric/Behavioral: Negative for depression. The patient is  not nervous/anxious and does not have insomnia.     Physical Exam: BP 122/74   Pulse 78   Temp (!) 97.3 F (36.3 C)   Wt 175 lb (79.4 kg)   SpO2 99%   BMI 26.61 kg/m  Wt Readings from Last 3 Encounters:  08/20/20 175 lb (79.4 kg)  05/10/20 174 lb (78.9 kg)  02/01/20 165 lb (74.8 kg)    General Appearance: Well nourished well developed, in no apparent distress. Eyes: PERRLA, EOMs, conjunctiva no swelling or erythema ENT/Mouth: Ear canals normal, without swelling, erythma, discharge.  TMs normal bilaterally.  Oropharynx moist, clear, without exudate, or postoropharyngeal swelling. Neck: Supple, thyroid normal,no cervical adenopathy  Respiratory: Respiratory effort normal, Breath sounds clear A&P without rhonchi, wheeze, or rale.  No retractions, no accessory usage. Cardio: RRR with no MRGs. Brisk peripheral pulses without edema.  Abdomen: Soft, + BS,  Non tender, no guarding, rebound, hernias, masses. Musculoskeletal: Full ROM, 5/5 strength, Normal gait Skin: Warm, dry without rashes, lesions, ecchymosis.  Neuro: Awake and oriented X 3, Cranial nerves intact. Normal muscle  tone, no cerebellar symptoms. Psych: Normal affect, Insight and Judgment appropriate.    Medicare Attestation I have personally reviewed: The patient's medical and social history Their use of alcohol, tobacco or illicit drugs Their current medications and supplements The patient's functional ability including ADLs,fall risks, home safety risks, cognitive, and hearing and visual impairment Diet and physical activities Evidence for depression or mood disorders  The patient's weight, height, BMI, and visual acuity have been recorded in the chart.  I have made referrals, counseling, and provided education to the patient based on review of the above and I have provided the patient with a written personalized care plan for preventive services.     Garnet Sierras, Laqueta Jean, DNP [facility] 08/20/2020  11:44 AM

## 2020-10-02 DIAGNOSIS — N3281 Overactive bladder: Secondary | ICD-10-CM | POA: Diagnosis not present

## 2020-11-12 DIAGNOSIS — R350 Frequency of micturition: Secondary | ICD-10-CM | POA: Diagnosis not present

## 2020-11-12 DIAGNOSIS — R3915 Urgency of urination: Secondary | ICD-10-CM | POA: Diagnosis not present

## 2020-11-27 ENCOUNTER — Encounter: Payer: Self-pay | Admitting: Internal Medicine

## 2020-11-27 NOTE — Progress Notes (Signed)
Future Appointments  Date Time Provider [place]  11/28/2020 10:30 AM Unk Pinto, MD [facility] None  08/20/2021 - CPE 11:00 AM Garnet Sierras, NP [facility] None    History of Present Illness:       This very nice 70 y.o.  MWM  presents for 6 month follow up with HTN, HLD, Pre-Diabetes and Vitamin D Deficiency.  Chest CT scan in 2017 showed Aortic atherosclerosis       Patient is treated for HTN (2012)  & BP has been controlled at home. Today's BP is at goal -  130/72. Patient has had no complaints of any cardiac type chest pain, palpitations, dyspnea / orthopnea / PND, dizziness, claudication, or dependent edema.       Hyperlipidemia is controlled with diet & Atorvastatin. Patient denies myalgias or other med SE's. Last Lipids were  Lab Results  Component Value Date   CHOL 135 08/20/2020   HDL 56 08/20/2020   LDLCALC 62 08/20/2020   TRIG 88 08/20/2020   CHOLHDL 2.4 08/20/2020    Also, the patient has history of PreDiabetes (A1c 5.8%&6.3% /2011) and has had no symptoms of reactive hypoglycemia, diabetic polys, paresthesias or visual blurring.  Last A1c was normal & at goal:  Lab Results  Component Value Date   HGBA1C 5.6 05/10/2020            Further, the patient also has history of Vitamin D Deficiency ("31" /2013)and supplements vitamin D without any suspected side-effects. Last vitamin D was at goal:   Lab Results  Component Value Date   VD25OH 64 05/10/2020     Current Outpatient Medications on File Prior to Visit  Medication Sig  . aspirin EC 81 MG tablet Take 1 tablet  3 times week - Sun, Wed and Fri  . atorvastatin  40 MG tablet TAKE 1 TABLET  DAILY   . bisoprolol  5 MG tablet TAKE 1 TABLET   DAILY   .  Take 1 tablet Daily        . VITAMIN D 1000 UNITS  Take  daily.   . famotidine  40 MG tablet Take 1 tablet at Bedtime     No Known Allergies   PMHx:   Past Medical History:  Diagnosis Date  . Acute appendicitis 11/24/2016  . COPD  (chronic obstructive pulmonary disease) ([place])   . GERD (gastroesophageal reflux disease)   . Hematuria   . HLD (hyperlipidemia)   . Hypertension   . IBS (irritable bowel syndrome)   . Pre-diabetes   . Vitamin D deficiency      Immunization History  Administered Date(s) Administered  . Influenza Whole 04/07/2013  . Influenza, High Dose   04/08/2018, 04/14/2019, 05/10/2020  . Influenza 03/31/2017  . PFIZER  SARS-COV-2 Vacc 07/15/2019, 08/04/2019, 03/28/2020  . PPD Test 12/05/2013  . Pneumococcal -13 06/08/2014  . Pneumococcal -23 09/07/2014  . Tdap 04/29/2011  . Zoster, Live 06/12/2014     Past Surgical History:  Procedure Laterality Date  . CARPAL TUNNEL RELEASE Right   . CATARACT EXTRACTION, BILATERAL Bilateral    Dr. Prudencio Burly  . CERVICAL FUSION    . HAND SURGERY Left   . LAPAROSCOPIC APPENDECTOMY N/A 11/24/2016   Procedure: APPENDECTOMY LAPAROSCOPIC;  Surgeon: Clovis Riley, MD;  Location: [place];  Service: General;  Laterality: N/A;  . TRIGGER FINGER RELEASE Right    thumb    FHx:    Reviewed / unchanged  SHx:    Reviewed / unchanged  Systems Review:  Constitutional: Denies fever, chills, wt changes, headaches, insomnia, fatigue, night sweats, change in appetite. Eyes: Denies redness, blurred vision, diplopia, discharge, itchy, watery eyes.  ENT: Denies discharge, congestion, post nasal drip, epistaxis, sore throat, earache, hearing loss, dental pain, tinnitus, vertigo, sinus pain, snoring.  CV: Denies chest pain, palpitations, irregular heartbeat, syncope, dyspnea, diaphoresis, orthopnea, PND, claudication or edema. Respiratory: denies cough, dyspnea, DOE, pleurisy, hoarseness, laryngitis, wheezing.  Gastrointestinal: Denies dysphagia, odynophagia, heartburn, reflux, water brash, abdominal pain or cramps, nausea, vomiting, bloating, diarrhea, constipation, hematemesis, melena, hematochezia  or hemorrhoids. Genitourinary: Denies dysuria, frequency, urgency,  nocturia, hesitancy, discharge, hematuria or flank pain. Musculoskeletal: Denies arthralgias, myalgias, stiffness, jt. swelling, pain, limping or strain/sprain.  Skin: Denies pruritus, rash, hives, warts, acne, eczema or change in skin lesion(s). Neuro: No weakness, tremor, incoordination, spasms, paresthesia or pain. Psychiatric: Denies confusion, memory loss or sensory loss. Endo: Denies change in weight, skin or hair change.  Heme/Lymph: No excessive bleeding, bruising or enlarged lymph nodes.  Physical Exam  BP 130/72 (BP Location: Right Arm, Patient Position: Sitting, Cuff Size: Normal)   Pulse 60   Temp (!) 97.3 F (36.3 C)   Ht 5\' 8"  (1.727 m)   Wt 178 lb 9.6 oz (81 kg)   SpO2 99%   BMI 27.16 kg/m   Appears  well nourished, well groomed  and in no distress.  Eyes: PERRLA, EOMs, conjunctiva no swelling or erythema. Sinuses: No frontal/maxillary tenderness ENT/Mouth: EAC's clear, TM's nl w/o erythema, bulging. Nares clear w/o erythema, swelling, exudates. Oropharynx clear without erythema or exudates. Oral hygiene is good. Tongue normal, non obstructing. Hearing intact.  Neck: Supple. Thyroid not palpable. Car 2+/2+ without bruits, nodes or JVD. Chest: Respirations nl with BS clear & equal w/o rales, rhonchi, wheezing or stridor.  Cor: Heart sounds normal w/ regular rate and rhythm without sig. murmurs, gallops, clicks or rubs. Peripheral pulses normal and equal  without edema.  Abdomen: Soft & bowel sounds normal. Non-tender w/o guarding, rebound, hernias, masses or organomegaly.  Lymphatics: Unremarkable.  Musculoskeletal: Full ROM all peripheral extremities, joint stability, 5/5 strength and normal gait.  Skin: Warm, dry without exposed rashes, lesions or ecchymosis apparent.  Neuro: Cranial nerves intact, reflexes equal bilaterally. Sensory-motor testing grossly intact. Tendon reflexes grossly intact.  Pysch: Alert & oriented x 3.  Insight and judgement nl & appropriate. No  ideations.  Assessment and Plan:  1. Essential hypertension  - Continue medication, monitor blood pressure at home.  - Continue DASH diet.  Reminder to go to the ER if any CP,  SOB, nausea, dizziness, severe HA, changes vision/speech.  - CBC with Differential/Platelet - COMPLETE METABOLIC PANEL WITH GFR - Magnesium - TSH  2. Hyperlipidemia, mixed  - Continue diet/meds, exercise,& lifestyle modifications.  - Continue monitor periodic cholesterol/liver & renal functions   - Lipid panel - TSH  3. Abnormal glucose  - Continue diet, exercise  - Lifestyle modifications.  - Monitor appropriate labs  - Hemoglobin A1c - Insulin, random  4. Vitamin D deficiency  - Continue supplementation.  - VITAMIN D 25 Hydroxy   5. Thoracic aorta atherosclerosis ([place]) by Chest CT scan in 2017  - Lipid panel  6. BPH with obstruction/lower urinary tract symptoms  - Treated by his Urologist with  mirabegron ER  50 mg &  tamsulosin  0.4 mg  7. Medication management  - CBC with Differential/Platelet - COMPLETE METABOLIC PANEL WITH GFR - Magnesium - Lipid panel - TSH - Hemoglobin A1c -  Insulin, random - VITAMIN D 25 Hydroxy          Discussed  regular exercise, BP monitoring, weight control to achieve/maintain BMI less than 25 and discussed med and SE's. Recommended labs to assess and monitor clinical status with further disposition pending results of labs.  I discussed the assessment and treatment plan with the patient. The patient was provided an opportunity to ask questions and all were answered. The patient agreed with the plan and demonstrated an understanding of the instructions.  I provided over 30 minutes of exam, counseling, chart review and  complex critical decision making.         The patient was advised to call back or seek an in-person evaluation if the symptoms worsen or if the condition fails to improve as anticipated.   Kirtland Bouchard, MD

## 2020-11-27 NOTE — Patient Instructions (Signed)

## 2020-11-28 ENCOUNTER — Encounter: Payer: Self-pay | Admitting: Internal Medicine

## 2020-11-28 ENCOUNTER — Ambulatory Visit (INDEPENDENT_AMBULATORY_CARE_PROVIDER_SITE_OTHER): Payer: Medicare Other | Admitting: Internal Medicine

## 2020-11-28 ENCOUNTER — Other Ambulatory Visit: Payer: Self-pay

## 2020-11-28 VITALS — BP 130/72 | HR 60 | Temp 97.3°F | Ht 68.0 in | Wt 178.6 lb

## 2020-11-28 DIAGNOSIS — E782 Mixed hyperlipidemia: Secondary | ICD-10-CM

## 2020-11-28 DIAGNOSIS — Z79899 Other long term (current) drug therapy: Secondary | ICD-10-CM

## 2020-11-28 DIAGNOSIS — N138 Other obstructive and reflux uropathy: Secondary | ICD-10-CM | POA: Diagnosis not present

## 2020-11-28 DIAGNOSIS — I700 Atherosclerosis of aorta: Secondary | ICD-10-CM | POA: Diagnosis not present

## 2020-11-28 DIAGNOSIS — R7309 Other abnormal glucose: Secondary | ICD-10-CM

## 2020-11-28 DIAGNOSIS — I10 Essential (primary) hypertension: Secondary | ICD-10-CM | POA: Diagnosis not present

## 2020-11-28 DIAGNOSIS — E559 Vitamin D deficiency, unspecified: Secondary | ICD-10-CM

## 2020-11-28 DIAGNOSIS — N401 Enlarged prostate with lower urinary tract symptoms: Secondary | ICD-10-CM

## 2020-11-28 MED ORDER — MIRABEGRON ER 50 MG PO TB24: ORAL_TABLET | ORAL | 3 refills | Status: DC

## 2020-11-28 MED ORDER — TAMSULOSIN HCL 0.4 MG PO CAPS: ORAL_CAPSULE | ORAL | 3 refills | Status: DC

## 2020-11-29 LAB — COMPLETE METABOLIC PANEL WITH GFR
AG Ratio: 1.6 (calc) (ref 1.0–2.5)
ALT: 33 U/L (ref 9–46)
AST: 30 U/L (ref 10–35)
Albumin: 4.2 g/dL (ref 3.6–5.1)
Alkaline phosphatase (APISO): 74 U/L (ref 35–144)
BUN/Creatinine Ratio: 14 (calc) (ref 6–22)
BUN: 22 mg/dL (ref 7–25)
CO2: 31 mmol/L (ref 20–32)
Calcium: 10.4 mg/dL — ABNORMAL HIGH (ref 8.6–10.3)
Chloride: 105 mmol/L (ref 98–110)
Creat: 1.55 mg/dL — ABNORMAL HIGH (ref 0.70–1.18)
GFR, Est African American: 51 mL/min/{1.73_m2} — ABNORMAL LOW (ref >=60)
GFR, Est Non African American: 44 mL/min/{1.73_m2} — ABNORMAL LOW (ref >=60)
Globulin: 2.7 g/dL (calc) (ref 1.9–3.7)
Glucose, Bld: 98 mg/dL (ref 65–99)
Potassium: 5 mmol/L (ref 3.5–5.3)
Sodium: 143 mmol/L (ref 135–146)
Total Bilirubin: 0.4 mg/dL (ref 0.2–1.2)
Total Protein: 6.9 g/dL (ref 6.1–8.1)

## 2020-11-29 LAB — CBC WITH DIFFERENTIAL/PLATELET
Absolute Monocytes: 644 cells/uL (ref 200–950)
Basophils Absolute: 63 cells/uL (ref 0–200)
Basophils Relative: 1.1 %
Eosinophils Absolute: 143 cells/uL (ref 15–500)
Eosinophils Relative: 2.5 %
HCT: 36.5 % — ABNORMAL LOW (ref 38.5–50.0)
Hemoglobin: 12 g/dL — ABNORMAL LOW (ref 13.2–17.1)
Lymphs Abs: 1379 cells/uL (ref 850–3900)
MCH: 30.6 pg (ref 27.0–33.0)
MCHC: 32.9 g/dL (ref 32.0–36.0)
MCV: 93.1 fL (ref 80.0–100.0)
MPV: 11 fL (ref 7.5–12.5)
Monocytes Relative: 11.3 %
Neutro Abs: 3471 cells/uL (ref 1500–7800)
Neutrophils Relative %: 60.9 %
Platelets: 252 10*3/uL (ref 140–400)
RBC: 3.92 10*6/uL — ABNORMAL LOW (ref 4.20–5.80)
RDW: 12.2 % (ref 11.0–15.0)
Total Lymphocyte: 24.2 %
WBC: 5.7 10*3/uL (ref 3.8–10.8)

## 2020-11-29 LAB — HEMOGLOBIN A1C
Hgb A1c MFr Bld: 5.5 % of total Hgb (ref <5.7)
Mean Plasma Glucose: 111 mg/dL
eAG (mmol/L): 6.2 mmol/L

## 2020-11-29 LAB — INSULIN, RANDOM: Insulin: 18.2 u[IU]/mL

## 2020-11-29 LAB — LIPID PANEL
Cholesterol: 152 mg/dL (ref <200)
HDL: 63 mg/dL (ref >=40)
LDL Cholesterol (Calc): 72 mg/dL (calc)
Non-HDL Cholesterol (Calc): 89 mg/dL (calc) (ref <130)
Total CHOL/HDL Ratio: 2.4 (calc) (ref <5.0)
Triglycerides: 90 mg/dL (ref <150)

## 2020-11-29 LAB — VITAMIN D 25 HYDROXY (VIT D DEFICIENCY, FRACTURES): Vit D, 25-Hydroxy: 56 ng/mL (ref 30–100)

## 2020-11-29 LAB — MAGNESIUM: Magnesium: 2.2 mg/dL (ref 1.5–2.5)

## 2020-11-29 LAB — TSH: TSH: 0.82 mIU/L (ref 0.40–4.50)

## 2020-11-29 NOTE — Progress Notes (Signed)
============================================================ -   Test results slightly outside the reference range are not unusual. If there is anything important, I will review this with you,  otherwise it is considered normal test values.  If you have further questions,  please do not hesitate to contact me at the office or via My Chart.  ============================================================ ============================================================  -  Kidney functions are still Stage 3 -4  and Stable    - Kidney functions but still look a little dehydrated   -  Very important to drink adequate amounts of fluids                                                             to prevent permanent kidney damage    - Recommend drink at least                               6 bottles (16 ounces) of fluids /water /day = 96 Oz ~100 oz   - 100 oz = 3,000 cc or 3 liters / day  - >>                                                  That's 1 &1/2 bottles of a 2 liter soda bottle /day !   ============================================================ ============================================================  -  Total Chol = 152  and    LDL Chol = 72   -  Both  Excellent   - Very low risk for Heart Attack  / Stroke ============================================================ ============================================================  -  A1c -Normal - Great  - No Diabetes  ! ============================================================ ============================================================  -  Vitamin D =  56 - OK  ============================================================ ============================================================  -  All Else - CBC  - Electrolytes - Liver - Magnesium & Thyroid    - all  Normal /  OK ============================================================ ============================================================

## 2020-12-13 DIAGNOSIS — R350 Frequency of micturition: Secondary | ICD-10-CM | POA: Diagnosis not present

## 2020-12-13 DIAGNOSIS — N3281 Overactive bladder: Secondary | ICD-10-CM | POA: Diagnosis not present

## 2021-02-04 DIAGNOSIS — Z961 Presence of intraocular lens: Secondary | ICD-10-CM | POA: Diagnosis not present

## 2021-02-04 DIAGNOSIS — H31002 Unspecified chorioretinal scars, left eye: Secondary | ICD-10-CM | POA: Diagnosis not present

## 2021-02-04 DIAGNOSIS — H26491 Other secondary cataract, right eye: Secondary | ICD-10-CM | POA: Diagnosis not present

## 2021-02-04 DIAGNOSIS — H5211 Myopia, right eye: Secondary | ICD-10-CM | POA: Diagnosis not present

## 2021-02-07 DIAGNOSIS — H26491 Other secondary cataract, right eye: Secondary | ICD-10-CM | POA: Diagnosis not present

## 2021-03-13 DIAGNOSIS — N3281 Overactive bladder: Secondary | ICD-10-CM | POA: Diagnosis not present

## 2021-03-13 DIAGNOSIS — R3915 Urgency of urination: Secondary | ICD-10-CM | POA: Diagnosis not present

## 2021-05-09 NOTE — Progress Notes (Signed)
Complete Physical Exam  Assessment / Plan:    Encounter for general adult medical examination with abnormal findings Yearly  Essential hypertension - continue medications, DASH diet, exercise and monitor at home. Call if greater than 130/80.  -     CBC with Differential/Platelet -     CMP/GFR -     TSH  Mixed hyperlipidemia Continue medications: Atorvastatin 40mg , three days a week Discussed dietary and exercise modifications Low fat diet  Thoracic aorta atherosclerosis ([place]) Per numerous CXR/CTs 10/15/19 Control blood pressure, cholesterol, glucose, increase exercise.   COPD (chronic obstructive pulmonary disease) with chronic bronchitis (HCC) Former smoker, dx per imaging, dyspnea with exertion Continue to monitor symptoms  Chronic kidney disease, stage 3 (HCC) Increase fluids, avoid NSAIDS, monitor sugars, will monitor -     CMP WITH GFR  Medication management -     Magnesium  Other abnormal glucose Discussed disease progression and risks Discussed diet/exercise, weight management and risk modification  Vitamin D deficiency Continue supplement  Generalized anxiety disorder Continue wellbutrin  Overweight (BMI 25.0-29.9) - long discussion about weight loss, diet, and exercise -recommended diet heavy in fruits and veggies and low in animal meats, cheeses, and dairy products  54 pack year smoking history/lung nodules 54 pack, no symptoms, quit 15 years ago Last year he was advised he could not get insurance to cover the Low Dose CT, will order CXR  Screening for Ischemic Heart Disease EKG  Screening for hematuria or proteinuria UA routine with reflex microscopic Microalbumin/creatinine urine ratio  Flu Vaccine Need High Dose Flu vaccine given  Pneumococcal vaccine Need Pneumococcal 20 given  Cerumen Impaction Bilaterally Ear lavage performed bilaterally- wax removed, ear canals clean    Future Appointments  Date Time Provider [place]   08/20/2021 11:00 AM Magda Bernheim, NP [facility] None  05/14/2022  9:00 AM Magda Bernheim, NP [facility] None   70 y.o. male  presents for 3 month follow up with hypertension, hyperlipidemia, prediabetes and vitamin D and medicare wellness visit.    He is on mybetriq and tamsulosin for OAB per Dr. Karsten Ro. He is waking up once a night and sometimes not at all.  He had Norovirus in May 2021 and followed up in April outpatient.  Reports that he has loose stools intermittently couple times a month, then resolve. He had an episode of diarrhea this morning, but no other associated symptoms.   He is on wellbutrin for anxiety/depression which helps.   BMI is Body mass index is 28.92 kg/m., he has not been working on diet and exercise, walks more when warm, will restart in the spring.  Wt Readings from Last 3 Encounters:  05/14/21 187 lb 6.4 oz (85 kg)  11/28/20 178 lb 9.6 oz (81 kg)  08/20/20 175 lb (79.4 kg)    His blood pressure has been controlled at home, today their BP is BP: 110/62.    He does not workout. He denies chest pain, dizziness.   He is a former smoker, 54 pack years, quit in 2005, does have diagnosis of COPD via CXR in 2013, today he endorses ongoing exertional dyspnea with exercise, denies fatigue, CP or other accompaniments. He currently uses PRN albuterol which does work well for him. He has not tried any daily inhalers. Denies coughing or AM secretions.   Had low dose screening chest CT in 11/2018 that showed multiple nodules felt to be benign appearing, unable to get further CT due to insurance, will get yearly CXR CT also  shows aortic atherosclerosis and emphysema.    He is on cholesterol medication, he is taking one tablet, lipitor 3 days a week and denies myalgias. His cholesterol is at goal. The cholesterol last visit was:   Lab Results  Component Value Date   CHOL 152 11/28/2020   HDL 63 11/28/2020   LDLCALC 72 11/28/2020   TRIG 90 11/28/2020   CHOLHDL 2.4 11/28/2020     He has been working on diet and exercise for prediabetes, and denies foot ulcerations, hyperglycemia, hypoglycemia , increased appetite, nausea, paresthesia of the feet, polydipsia, polyuria, visual disturbances, vomiting and weight loss. Last A1C in the office was:  Lab Results  Component Value Date   HGBA1C 5.5 11/28/2020   Patient is on Vitamin D supplement.  Lab Results  Component Value Date   VD25OH 56 11/28/2020    He has labile CKD III GFR values which are monitored closely.  Lab Results  Component Value Date   NIDPOEUM 35 (L) 11/28/2020     Current Medications:  Current Outpatient Medications on File Prior to Visit  Medication Sig Dispense Refill   aspirin EC 81 MG tablet Take 81 mg by mouth See admin instructions. Take 1 tablet (81 mg) by mouth 3 times week - Sunday, Wednesday and Friday - with lunch     atorvastatin (LIPITOR) 40 MG tablet TAKE 1 TABLET BY MOUTH  DAILY FOR CHOLESTEROL 90 tablet 3   bisoprolol (ZEBETA) 5 MG tablet TAKE 1 TABLET BY MOUTH  DAILY FOR BLOOD PRESSURE 90 tablet 3   buPROPion (WELLBUTRIN XL) 150 MG 24 hr tablet Take     1 tablet       Daily      for Mood, Focus & Concentration 90 tablet 3   Cholecalciferol (VITAMIN D-3) 1000 UNITS CAPS Take 5,000 Units by mouth daily.      mirabegron ER (MYRBETRIQ) 50 MG TB24 tablet Take 1 tablet  Daily  for Bladder control 90 tablet 3   tamsulosin (FLOMAX) 0.4 MG CAPS capsule Take 1 tablet at Bedtime for Prostate 90 capsule 3   famotidine (PEPCID) 40 MG tablet Take 1 tablet at Bedtime for   Acid Indigestion & Reflux (Patient not taking: Reported on 05/14/2021) 90 tablet 3   No current facility-administered medications on file prior to visit.    Medical History:  Past Medical History:  Diagnosis Date   Acute appendicitis 11/24/2016   COPD (chronic obstructive pulmonary disease) (HCC)    GERD (gastroesophageal reflux disease)    Hematuria    HLD (hyperlipidemia)    Hypertension    IBS (irritable bowel  syndrome)    Pre-diabetes    Vitamin D deficiency    Preventative care:  Immunization History  Administered Date(s) Administered   Influenza Whole 04/07/2013   Influenza, High Dose Seasonal PF 04/13/2014, 03/27/2015, 03/25/2016, 04/08/2018, 04/14/2019, 05/10/2020   Influenza-Unspecified 03/31/2017   PFIZER(Purple Top)SARS-COV-2 Vaccination 07/15/2019, 08/04/2019, 03/28/2020   PPD Test 12/05/2013   Pneumococcal Conjugate-13 06/08/2014   Pneumococcal Polysaccharide-23 09/07/2014   Tdap 04/29/2011   Zoster, Live 06/12/2014    Last colonoscopy: 2017 Dr. Fuller Plan. 10 year follow up EGD: 01/2013 DEXA: N/A CXR 09/02/2013 CT chest 11/2019 - nodules - 1 year follow up Ct AB 11/2016   Prior vaccinations: TD or Tdap: 2012 , DUE  will hold to AWV, getting flu and pneumonia today    Influenza: 05/14/21 Pneumococcal 20: 05/14/21 Prevnar13: 2015 Shingles/Zostavax: 2015 Covid 19: 1/2   Names of Other Physician/Practitioners you currently use:  1. [facility]- here for primary care 2. Dr. Katy Apo, eye doctor, last visit 2022, yearly 3. Dr. Jasmine December, last visit 12/2020, goes q25m  Patient Care Team: Unk Pinto, MD as PCP - General (Internal Medicine) Katy Apo, MD as Consulting Physician (Ophthalmology) Ladene Artist, MD as Consulting Physician (Gastroenterology)    Allergies No Known Allergies  SURGICAL HISTORY He  has a past surgical history that includes Cervical fusion; Trigger finger release (Right); Carpal tunnel release (Right); Hand surgery (Left); laparoscopic appendectomy (N/A, 11/24/2016); and Cataract extraction, bilateral (Bilateral). FAMILY HISTORY His family history includes COPD in his mother; Cancer in his father; Diverticulosis in his mother; Hypertension in his mother; Irritable bowel syndrome in his mother; Kidney disease in his father; Lung cancer in his father; Lupus in his mother; Other in his father; Stroke in his  father. SOCIAL HISTORY He  reports that he quit smoking about 17 years ago. His smoking use included cigarettes. He has a 52.50 pack-year smoking history. He has never used smokeless tobacco. He reports current alcohol use. He reports that he does not use drugs.    Review of Systems:  Review of Systems  Constitutional:  Negative for chills, fever and malaise/fatigue.  HENT:  Negative for congestion, ear pain and sore throat.        Nose runs when he eats  Respiratory:  Negative for cough, shortness of breath and wheezing.   Cardiovascular:  Negative for chest pain, palpitations and leg swelling.  Gastrointestinal:  Positive for diarrhea (intermittent). Negative for abdominal pain, blood in stool, constipation, heartburn and melena.  Genitourinary: Negative.  Negative for dysuria.  Musculoskeletal:  Negative for back pain, joint pain and myalgias.  Skin: Negative.   Neurological:  Negative for dizziness, sensory change, loss of consciousness and headaches.  Endo/Heme/Allergies:  Does not bruise/bleed easily.  Psychiatric/Behavioral:  Negative for depression. The patient is not nervous/anxious and does not have insomnia.    Physical Exam: BP 110/62   Pulse 67   Temp (!) 97.5 F (36.4 C)   Ht 5' 7.5" (1.715 m)   Wt 187 lb 6.4 oz (85 kg)   SpO2 98%   BMI 28.92 kg/m  Wt Readings from Last 3 Encounters:  05/14/21 187 lb 6.4 oz (85 kg)  11/28/20 178 lb 9.6 oz (81 kg)  08/20/20 175 lb (79.4 kg)    General Appearance: Well nourished well developed, in no apparent distress. Eyes: PERRLA, EOMs, conjunctiva no swelling or erythema ENT/Mouth: Ear canals cerumen impaction bilaterally. Wax removed with lavage. TMs normal bilaterally.  Oropharynx moist, clear, without exudate, or postoropharyngeal swelling. Neck: Supple, thyroid normal,no cervical adenopathy  Respiratory: Respiratory effort normal, Breath sounds clear A&P without rhonchi, wheeze, or rale.  No retractions, no accessory  usage. Cardio: RRR with no MRGs. Brisk peripheral pulses without edema.  Abdomen: Soft, + BS,  Non tender, no guarding, rebound, hernias, masses. Musculoskeletal: Full ROM, 5/5 strength, Normal gait Skin: Warm, dry without rashes, lesions, ecchymosis.  Neuro: Awake and oriented X 3, Cranial nerves intact. Normal muscle tone, no cerebellar symptoms. Psych: Normal affect, Insight and Judgment appropriate.    EKG: NSR, no ST changes   [facility]  05/14/2021

## 2021-05-14 ENCOUNTER — Encounter: Payer: Self-pay | Admitting: Nurse Practitioner

## 2021-05-14 ENCOUNTER — Other Ambulatory Visit: Payer: Self-pay

## 2021-05-14 ENCOUNTER — Ambulatory Visit (INDEPENDENT_AMBULATORY_CARE_PROVIDER_SITE_OTHER): Payer: Medicare Other | Admitting: Nurse Practitioner

## 2021-05-14 VITALS — BP 110/62 | HR 67 | Temp 97.5°F | Ht 67.5 in | Wt 187.4 lb

## 2021-05-14 DIAGNOSIS — H6123 Impacted cerumen, bilateral: Secondary | ICD-10-CM

## 2021-05-14 DIAGNOSIS — E559 Vitamin D deficiency, unspecified: Secondary | ICD-10-CM | POA: Diagnosis not present

## 2021-05-14 DIAGNOSIS — Z1389 Encounter for screening for other disorder: Secondary | ICD-10-CM

## 2021-05-14 DIAGNOSIS — Z79899 Other long term (current) drug therapy: Secondary | ICD-10-CM

## 2021-05-14 DIAGNOSIS — Z0000 Encounter for general adult medical examination without abnormal findings: Secondary | ICD-10-CM | POA: Diagnosis not present

## 2021-05-14 DIAGNOSIS — Z23 Encounter for immunization: Secondary | ICD-10-CM

## 2021-05-14 DIAGNOSIS — Z0001 Encounter for general adult medical examination with abnormal findings: Secondary | ICD-10-CM

## 2021-05-14 DIAGNOSIS — F411 Generalized anxiety disorder: Secondary | ICD-10-CM

## 2021-05-14 DIAGNOSIS — J449 Chronic obstructive pulmonary disease, unspecified: Secondary | ICD-10-CM

## 2021-05-14 DIAGNOSIS — N183 Chronic kidney disease, stage 3 unspecified: Secondary | ICD-10-CM

## 2021-05-14 DIAGNOSIS — I10 Essential (primary) hypertension: Secondary | ICD-10-CM | POA: Diagnosis not present

## 2021-05-14 DIAGNOSIS — R7309 Other abnormal glucose: Secondary | ICD-10-CM | POA: Diagnosis not present

## 2021-05-14 DIAGNOSIS — I700 Atherosclerosis of aorta: Secondary | ICD-10-CM

## 2021-05-14 DIAGNOSIS — E782 Mixed hyperlipidemia: Secondary | ICD-10-CM

## 2021-05-14 DIAGNOSIS — N401 Enlarged prostate with lower urinary tract symptoms: Secondary | ICD-10-CM

## 2021-05-14 DIAGNOSIS — E663 Overweight: Secondary | ICD-10-CM

## 2021-05-14 DIAGNOSIS — Z136 Encounter for screening for cardiovascular disorders: Secondary | ICD-10-CM | POA: Diagnosis not present

## 2021-05-14 DIAGNOSIS — Z87891 Personal history of nicotine dependence: Secondary | ICD-10-CM

## 2021-05-14 NOTE — Patient Instructions (Signed)

## 2021-05-15 LAB — MAGNESIUM: Magnesium: 2.1 mg/dL (ref 1.5–2.5)

## 2021-05-15 LAB — CBC WITH DIFFERENTIAL/PLATELET
Absolute Monocytes: 549 cells/uL (ref 200–950)
Basophils Absolute: 49 cells/uL (ref 0–200)
Basophils Relative: 0.8 %
Eosinophils Absolute: 189 cells/uL (ref 15–500)
Eosinophils Relative: 3.1 %
HCT: 37.2 % — ABNORMAL LOW (ref 38.5–50.0)
Hemoglobin: 12.6 g/dL — ABNORMAL LOW (ref 13.2–17.1)
Lymphs Abs: 1501 cells/uL (ref 850–3900)
MCH: 31.1 pg (ref 27.0–33.0)
MCHC: 33.9 g/dL (ref 32.0–36.0)
MCV: 91.9 fL (ref 80.0–100.0)
MPV: 11.3 fL (ref 7.5–12.5)
Monocytes Relative: 9 %
Neutro Abs: 3813 cells/uL (ref 1500–7800)
Neutrophils Relative %: 62.5 %
Platelets: 261 10*3/uL (ref 140–400)
RBC: 4.05 10*6/uL — ABNORMAL LOW (ref 4.20–5.80)
RDW: 12.1 % (ref 11.0–15.0)
Total Lymphocyte: 24.6 %
WBC: 6.1 10*3/uL (ref 3.8–10.8)

## 2021-05-15 LAB — COMPLETE METABOLIC PANEL WITH GFR
AG Ratio: 1.6 (calc) (ref 1.0–2.5)
ALT: 17 U/L (ref 9–46)
AST: 23 U/L (ref 10–35)
Albumin: 4.2 g/dL (ref 3.6–5.1)
Alkaline phosphatase (APISO): 88 U/L (ref 35–144)
BUN/Creatinine Ratio: 11 (calc) (ref 6–22)
BUN: 19 mg/dL (ref 7–25)
CO2: 28 mmol/L (ref 20–32)
Calcium: 9.5 mg/dL (ref 8.6–10.3)
Chloride: 105 mmol/L (ref 98–110)
Creat: 1.66 mg/dL — ABNORMAL HIGH (ref 0.70–1.28)
Globulin: 2.6 g/dL (calc) (ref 1.9–3.7)
Glucose, Bld: 91 mg/dL (ref 65–99)
Potassium: 5 mmol/L (ref 3.5–5.3)
Sodium: 140 mmol/L (ref 135–146)
Total Bilirubin: 0.3 mg/dL (ref 0.2–1.2)
Total Protein: 6.8 g/dL (ref 6.1–8.1)
eGFR: 44 mL/min/{1.73_m2} — ABNORMAL LOW (ref >=60)

## 2021-05-15 LAB — URINALYSIS, ROUTINE W REFLEX MICROSCOPIC
Bilirubin Urine: NEGATIVE
Glucose, UA: NEGATIVE
Hgb urine dipstick: NEGATIVE
Ketones, ur: NEGATIVE
Leukocytes,Ua: NEGATIVE
Nitrite: NEGATIVE
Protein, ur: NEGATIVE
Specific Gravity, Urine: 1.006 (ref 1.001–1.035)
pH: 6.5 (ref 5.0–8.0)

## 2021-05-15 LAB — LIPID PANEL
Cholesterol: 143 mg/dL (ref <200)
HDL: 64 mg/dL (ref >=40)
LDL Cholesterol (Calc): 63 mg/dL (calc)
Non-HDL Cholesterol (Calc): 79 mg/dL (calc) (ref <130)
Total CHOL/HDL Ratio: 2.2 (calc) (ref <5.0)
Triglycerides: 80 mg/dL (ref <150)

## 2021-05-15 LAB — PSA: PSA: 0.84 ng/mL (ref <=4.00)

## 2021-05-15 LAB — VITAMIN D 25 HYDROXY (VIT D DEFICIENCY, FRACTURES): Vit D, 25-Hydroxy: 61 ng/mL (ref 30–100)

## 2021-05-15 LAB — HEMOGLOBIN A1C
Hgb A1c MFr Bld: 5.5 % of total Hgb (ref <5.7)
Mean Plasma Glucose: 111 mg/dL
eAG (mmol/L): 6.2 mmol/L

## 2021-05-15 LAB — MICROALBUMIN / CREATININE URINE RATIO
Creatinine, Urine: 49 mg/dL (ref 20–320)
Microalb Creat Ratio: 14 mcg/mg creat (ref <30)
Microalb, Ur: 0.7 mg/dL

## 2021-05-15 LAB — TSH: TSH: 1.2 mIU/L (ref 0.40–4.50)

## 2021-05-20 ENCOUNTER — Ambulatory Visit
Admission: RE | Admit: 2021-05-20 | Discharge: 2021-05-20 | Disposition: A | Discharge status: Home / Self Care | Payer: Medicare Other | Source: Ambulatory Visit | Attending: Nurse Practitioner | Admitting: Nurse Practitioner

## 2021-05-20 ENCOUNTER — Other Ambulatory Visit: Payer: Self-pay

## 2021-05-20 DIAGNOSIS — Z87891 Personal history of nicotine dependence: Secondary | ICD-10-CM | POA: Diagnosis not present

## 2021-05-20 DIAGNOSIS — Z981 Arthrodesis status: Secondary | ICD-10-CM | POA: Diagnosis not present

## 2021-05-20 DIAGNOSIS — I700 Atherosclerosis of aorta: Secondary | ICD-10-CM | POA: Diagnosis not present

## 2021-07-20 ENCOUNTER — Other Ambulatory Visit: Payer: Self-pay | Admitting: Internal Medicine

## 2021-07-20 ENCOUNTER — Other Ambulatory Visit: Payer: Self-pay | Admitting: Adult Health

## 2021-07-20 DIAGNOSIS — F325 Major depressive disorder, single episode, in full remission: Secondary | ICD-10-CM

## 2021-08-19 NOTE — Progress Notes (Signed)
MEDICARE ANNUAL WELLNESS AND 3 MONTH FOLLOW UP   Assessment / Plan:    Encounter for annual medicare wellness visit Yearly Tetanus vaccination due this year, last 2012  Essential hypertension - continue medications, DASH diet, exercise and monitor at home. Call if greater than 130/80.  -     CBC with Differential/Platelet -     CMP/GFR -     TSH  Mixed hyperlipidemia Continue medications: Atorvastatin 40mg , three days a week Discussed dietary and exercise modifications Low fat diet -Lipid panel  Thoracic aorta atherosclerosis ([place]) Per numerous CXR/CTs 10/15/19 Control blood pressure, cholesterol, glucose, increase exercise.   COPD (chronic obstructive pulmonary disease) with chronic bronchitis ([place]) Former smoker, dx per imaging, dyspnea with exertion, given trelegy sample to try with coupon, instructions and demonstration given today. He will message back with progress.   Chronic kidney disease, stage 3 (HCC) Increase fluids, avoid NSAIDS, monitor sugars, will monitor -     CMP WITH GFR  Medication management -     Magnesium  Other abnormal glucose Discussed disease progression and risks Discussed diet/exercise, weight management and risk modification  Rhinorrhea Atrovent nasal spray to use as needed Continue to monitor  Vitamin D deficiency Continue supplement  Generalized anxiety disorder Continue wellbutrin  Overweight (BMI 25.0-29.9) - long discussion about weight loss, diet, and exercise -recommended diet heavy in fruits and veggies and low in animal meats, cheeses, and dairy products  54 pack year smoking history/lung nodules 54 pack, no symptoms, quit 15 years ago Has aged out will get yearly CXR last 05/20/21 WNL   Future Appointments  Date Time Provider [place]  05/14/2022  9:00 AM Magda Bernheim, NP [facility] None  09/03/2022 11:00 AM Magda Bernheim, NP [facility] None    Continue diet and meds as discussed. Further disposition pending  results of labs. During the course of the visit the patient was educated and counseled about appropriate screening and preventive services including:    Pneumococcal vaccine  Influenza vaccine Td vaccine Screening electrocardiogram Screening mammography Bone densitometry screening Colorectal cancer screening Diabetes screening Glaucoma screening Nutrition counseling  Advanced directives: given info/requested  HPI 71 y.o. male  presents for 3 month follow up with hypertension, hyperlipidemia, prediabetes and vitamin D and medicare wellness visit.    He is on Myrbetriq and Tamsulosin for OAB per Dr. Karsten Ro. He is waking 2-3 times a night.  He reports having to go to the bathroom 8-10 times a day. Better control than Ditropan  He has been noticing his nose is running a lot more. Worse when he first gets up or when eating  He is on wellbutrin for anxiety/depression which helps.   BMI is Body mass index is 28.76 kg/m., he has not been working on diet and exercise, walks more when warm, will restart in the spring.  Wt Readings from Last 3 Encounters:  08/20/21 186 lb 6.4 oz (84.6 kg)  05/14/21 187 lb 6.4 oz (85 kg)  11/28/20 178 lb 9.6 oz (81 kg)    His blood pressure has been controlled at home, today their BP is BP: (!) 132/54.   BP Readings from Last 3 Encounters:  08/20/21 (!) 132/54  05/14/21 110/62  11/28/20 130/72     He does not workout. He denies chest pain, dizziness.   He is a former smoker, 54 pack years, quit in 2005, does have diagnosis of COPD via CXR in 2013, today he endorses ongoing exertional dyspnea with exercise, denies fatigue, CP or  other accompaniments. He currently uses PRN albuterol which does work well for him. He has not tried any daily inhalers. Denies coughing or AM secretions.   Had low dose screening chest CT in 11/2018 that showed multiple nodules felt to be benign appearing, recommended annual follow up.  CT also shows aortic atherosclerosis and  emphysema.    He is on cholesterol medication, he is taking one tablet, lipitor 3 days a week and denies myalgias. His cholesterol is at goal. The cholesterol last visit was:   Lab Results  Component Value Date   CHOL 143 05/14/2021   HDL 64 05/14/2021   LDLCALC 63 05/14/2021   TRIG 80 05/14/2021   CHOLHDL 2.2 05/14/2021    He has been working on diet and exercise for prediabetes, and denies foot ulcerations, hyperglycemia, hypoglycemia , increased appetite, nausea, paresthesia of the feet, polydipsia, polyuria, visual disturbances, vomiting and weight loss. Last A1C in the office was:  Lab Results  Component Value Date   HGBA1C 5.5 05/14/2021   Patient is on Vitamin D supplement.  Lab Results  Component Value Date   VD25OH 61 05/14/2021    He has labile CKD III GFR values which are monitored closely.  Lab Results  Component Value Date   JIRCVELF 81 (L) 11/28/2020     Current Medications:  Current Outpatient Medications on File Prior to Visit  Medication Sig Dispense Refill   aspirin EC 81 MG tablet Take 81 mg by mouth See admin instructions. Take 1 tablet (81 mg) by mouth 3 times week - Sunday, Wednesday and Friday - with lunch     atorvastatin (LIPITOR) 40 MG tablet TAKE 1 TABLET BY MOUTH  DAILY FOR CHOLESTEROL 90 tablet 3   bisoprolol (ZEBETA) 5 MG tablet TAKE 1 TABLET BY MOUTH  DAILY FOR BLOOD PRESSURE 90 tablet 3   buPROPion (WELLBUTRIN XL) 150 MG 24 hr tablet TAKE 1 TABLET BY MOUTH  DAILY FOR MOOD, FOCUS, AND  CONCENTRATION 90 tablet 3   Cholecalciferol (VITAMIN D-3) 1000 UNITS CAPS Take 5,000 Units by mouth daily.      mirabegron ER (MYRBETRIQ) 50 MG TB24 tablet Take 1 tablet  Daily  for Bladder control 90 tablet 3   tamsulosin (FLOMAX) 0.4 MG CAPS capsule Take 1 tablet at Bedtime for Prostate 90 capsule 3   No current facility-administered medications on file prior to visit.    Medical History:  Past Medical History:  Diagnosis Date   Acute appendicitis 11/24/2016    COPD (chronic obstructive pulmonary disease) (HCC)    GERD (gastroesophageal reflux disease)    Hematuria    HLD (hyperlipidemia)    Hypertension    IBS (irritable bowel syndrome)    Pre-diabetes    Vitamin D deficiency    Preventative care:  Immunization History  Administered Date(s) Administered   Influenza Whole 04/07/2013   Influenza, High Dose Seasonal PF 04/13/2014, 03/27/2015, 03/25/2016, 04/08/2018, 04/14/2019, 05/10/2020, 05/14/2021   Influenza-Unspecified 03/31/2017   PFIZER Comirnaty(Gray Top)Covid-19 Tri-Sucrose Vaccine 11/12/2020   PFIZER(Purple Top)SARS-COV-2 Vaccination 07/15/2019, 08/04/2019, 03/28/2020   PNEUMOCOCCAL CONJUGATE-20 05/14/2021   PPD Test 12/05/2013   Pfizer Covid-19 Vaccine Bivalent Booster 38yrs & up 04/05/2021   Pneumococcal Conjugate-13 06/08/2014   Pneumococcal Polysaccharide-23 09/07/2014   Tdap 04/29/2011   Zoster, Live 06/12/2014   Health Maintenance  Topic Date Due   Zoster Vaccines- Shingrix (1 of 2) Never done   TETANUS/TDAP  04/28/2021   COLONOSCOPY (Pts 45-43yrs Insurance coverage will need to be confirmed)  02/27/2026  Pneumonia Vaccine 63+ Years old  Completed   INFLUENZA VACCINE  Completed   COVID-19 Vaccine  Completed   Hepatitis C Screening  Completed   HPV VACCINES  Aged Out   CT chest 11/2019 - nodules -has aged out following with CXR Ct AB 11/2016    Names of Other Physician/Practitioners you currently use: 1. [facility]- here for primary care 2. Dr. Katy Apo, eye doctor, last visit 2022, yearly 3. Dr. Jasmine December, last visit 08/10/21, goes q7m  Patient Care Team: Unk Pinto, MD as PCP - General (Internal Medicine) Katy Apo, MD as Consulting Physician (Ophthalmology) Ladene Artist, MD as Consulting Physician (Gastroenterology)    Allergies No Known Allergies  SURGICAL HISTORY He  has a past surgical history that includes Cervical fusion; Trigger finger  release (Right); Carpal tunnel release (Right); Hand surgery (Left); laparoscopic appendectomy (N/A, 11/24/2016); and Cataract extraction, bilateral (Bilateral). FAMILY HISTORY His family history includes COPD in his mother; Cancer in his father; Diverticulosis in his mother; Hypertension in his mother; Irritable bowel syndrome in his mother; Kidney disease in his father; Lung cancer in his father; Lupus in his mother; Other in his father; Stroke in his father. SOCIAL HISTORY He  reports that he quit smoking about 17 years ago. His smoking use included cigarettes. He has a 52.50 pack-year smoking history. He has never used smokeless tobacco. He reports current alcohol use. He reports that he does not use drugs.  MEDICARE WELLNESS OBJECTIVES: Physical activity: Current Exercise Habits: Home exercise routine, Type of exercise: walking, Time (Minutes): 40, Frequency (Times/Week): 3, Weekly Exercise (Minutes/Week): 120, Intensity: Mild, Exercise limited by: None identified Cardiac risk factors: Cardiac Risk Factors include: smoking/ tobacco exposure;advanced age (>62men, >60 women);dyslipidemia;hypertension;male gender Depression/mood screen:   Depression screen [facility] 2/9 08/20/2021  Decreased Interest 0  Down, Depressed, Hopeless 0  PHQ - 2 Score 0    ADLs:  In your present state of health, do you have any difficulty performing the following activities: 08/20/2021 11/27/2020  Hearing? N N  Vision? N N  Difficulty concentrating or making decisions? N N  Walking or climbing stairs? N N  Dressing or bathing? N N  Doing errands, shopping? N N  Preparing Food and eating ? - -  Using the Toilet? - -  In the past six months, have you accidently leaked urine? - -  Do you have problems with loss of bowel control? - -  Managing your Medications? - -  Managing your Finances? - -  Housekeeping or managing your Housekeeping? - -  Some recent data might be hidden     Cognitive Testing  Alert? Yes  Normal  Appearance?Yes  Oriented to person? Yes  Place? Yes   Time? Yes  Recall of three objects?  Yes  Can perform simple calculations? Yes  Displays appropriate judgment?Yes  Can read the correct time from a watch face?Yes  EOL planning: Does Patient Have a Medical Advance Directive?: Yes Type of Advance Directive: Healthcare Power of Attorney, Living will Does patient want to make changes to medical advance directive?: No - Patient declined Copy of [place] in Chart?: No - copy requested    Review of Systems:  Review of Systems  Constitutional:  Negative for chills, fever and malaise/fatigue.  HENT:  Negative for congestion, ear pain and sore throat.   Respiratory:  Negative for cough, shortness of breath and wheezing.   Cardiovascular:  Negative for chest pain, palpitations and leg swelling.  Gastrointestinal:  Negative for blood in stool, constipation, diarrhea, heartburn and melena.  Genitourinary: Negative.   Musculoskeletal:  Negative for myalgias.  Skin: Negative.   Neurological:  Negative for dizziness, sensory change, loss of consciousness and headaches.  Psychiatric/Behavioral:  Negative for depression. The patient is not nervous/anxious and does not have insomnia.    Physical Exam: BP (!) 132/54    Pulse 68    Temp (!) 97.5 F (36.4 C)    Wt 186 lb 6.4 oz (84.6 kg)    SpO2 97%    BMI 28.76 kg/m  Wt Readings from Last 3 Encounters:  08/20/21 186 lb 6.4 oz (84.6 kg)  05/14/21 187 lb 6.4 oz (85 kg)  11/28/20 178 lb 9.6 oz (81 kg)    General Appearance: Well nourished well developed, in no apparent distress. Eyes: PERRLA, EOMs, conjunctiva no swelling or erythema ENT/Mouth: Ear canals normal, without swelling, erythma, discharge.  TMs normal bilaterally.  Oropharynx moist, clear, without exudate, or postoropharyngeal swelling. Neck: Supple, thyroid normal,no cervical adenopathy  Respiratory: Respiratory effort normal, Breath sounds clear A&P without  rhonchi, wheeze, or rale.  No retractions, no accessory usage. Cardio: RRR with no MRGs. Brisk peripheral pulses without edema.  Abdomen: Soft, + BS,  Non tender, no guarding, rebound, hernias, masses. Musculoskeletal: Full ROM, 5/5 strength, Normal gait Skin: Warm, dry without rashes, lesions, ecchymosis.  Neuro: Awake and oriented X 3, Cranial nerves intact. Normal muscle tone, no cerebellar symptoms. Psych: Normal affect, Insight and Judgment appropriate.     Medicare Attestation I have personally reviewed: The patient's medical and social history Their use of alcohol, tobacco or illicit drugs Their current medications and supplements The patient's functional ability including ADLs,fall risks, home safety risks, cognitive, and hearing and visual impairment Diet and physical activities Evidence for depression or mood disorders   The patient's weight, height, BMI, and visual acuity have been recorded in the chart.  I have made referrals, counseling, and provided education to the patient based on review of the above and I have provided the patient with a written personalized care plan for preventive services.     Garnet Sierras, Laqueta Jean, DNP [facility] 08/20/2021  11:30 AM

## 2021-08-20 ENCOUNTER — Encounter: Payer: Self-pay | Admitting: Nurse Practitioner

## 2021-08-20 ENCOUNTER — Ambulatory Visit (INDEPENDENT_AMBULATORY_CARE_PROVIDER_SITE_OTHER): Payer: Medicare Other | Admitting: Nurse Practitioner

## 2021-08-20 ENCOUNTER — Other Ambulatory Visit: Payer: Self-pay

## 2021-08-20 ENCOUNTER — Ambulatory Visit: Payer: Medicare Other | Admitting: Adult Health Nurse Practitioner

## 2021-08-20 VITALS — BP 132/54 | HR 68 | Temp 97.5°F | Wt 186.4 lb

## 2021-08-20 DIAGNOSIS — R7309 Other abnormal glucose: Secondary | ICD-10-CM

## 2021-08-20 DIAGNOSIS — Z79899 Other long term (current) drug therapy: Secondary | ICD-10-CM | POA: Diagnosis not present

## 2021-08-20 DIAGNOSIS — Z0001 Encounter for general adult medical examination with abnormal findings: Secondary | ICD-10-CM | POA: Diagnosis not present

## 2021-08-20 DIAGNOSIS — R6889 Other general symptoms and signs: Secondary | ICD-10-CM | POA: Diagnosis not present

## 2021-08-20 DIAGNOSIS — J3489 Other specified disorders of nose and nasal sinuses: Secondary | ICD-10-CM | POA: Diagnosis not present

## 2021-08-20 DIAGNOSIS — E559 Vitamin D deficiency, unspecified: Secondary | ICD-10-CM

## 2021-08-20 DIAGNOSIS — I10 Essential (primary) hypertension: Secondary | ICD-10-CM

## 2021-08-20 DIAGNOSIS — E663 Overweight: Secondary | ICD-10-CM

## 2021-08-20 DIAGNOSIS — Z0000 Encounter for general adult medical examination without abnormal findings: Secondary | ICD-10-CM

## 2021-08-20 DIAGNOSIS — Z23 Encounter for immunization: Secondary | ICD-10-CM

## 2021-08-20 DIAGNOSIS — R918 Other nonspecific abnormal finding of lung field: Secondary | ICD-10-CM

## 2021-08-20 DIAGNOSIS — N183 Chronic kidney disease, stage 3 unspecified: Secondary | ICD-10-CM | POA: Diagnosis not present

## 2021-08-20 DIAGNOSIS — E782 Mixed hyperlipidemia: Secondary | ICD-10-CM

## 2021-08-20 DIAGNOSIS — Z87891 Personal history of nicotine dependence: Secondary | ICD-10-CM | POA: Diagnosis not present

## 2021-08-20 DIAGNOSIS — J449 Chronic obstructive pulmonary disease, unspecified: Secondary | ICD-10-CM

## 2021-08-20 DIAGNOSIS — I700 Atherosclerosis of aorta: Secondary | ICD-10-CM | POA: Diagnosis not present

## 2021-08-20 DIAGNOSIS — F411 Generalized anxiety disorder: Secondary | ICD-10-CM

## 2021-08-20 MED ORDER — IPRATROPIUM BROMIDE 0.03 % NA SOLN: 2.0000 | Freq: Three times a day (TID) | NASAL | 2 refills | Status: DC

## 2021-08-21 LAB — CBC WITH DIFFERENTIAL/PLATELET
Absolute Monocytes: 622 cells/uL (ref 200–950)
Basophils Absolute: 28 cells/uL (ref 0–200)
Basophils Relative: 0.5 %
Eosinophils Absolute: 143 cells/uL (ref 15–500)
Eosinophils Relative: 2.6 %
HCT: 38 % — ABNORMAL LOW (ref 38.5–50.0)
Hemoglobin: 12.8 g/dL — ABNORMAL LOW (ref 13.2–17.1)
Lymphs Abs: 1348 cells/uL (ref 850–3900)
MCH: 30.6 pg (ref 27.0–33.0)
MCHC: 33.7 g/dL (ref 32.0–36.0)
MCV: 90.9 fL (ref 80.0–100.0)
MPV: 11.5 fL (ref 7.5–12.5)
Monocytes Relative: 11.3 %
Neutro Abs: 3361 cells/uL (ref 1500–7800)
Neutrophils Relative %: 61.1 %
Platelets: 247 10*3/uL (ref 140–400)
RBC: 4.18 10*6/uL — ABNORMAL LOW (ref 4.20–5.80)
RDW: 12.4 % (ref 11.0–15.0)
Total Lymphocyte: 24.5 %
WBC: 5.5 10*3/uL (ref 3.8–10.8)

## 2021-08-21 LAB — COMPLETE METABOLIC PANEL WITH GFR
AG Ratio: 1.6 (calc) (ref 1.0–2.5)
ALT: 20 U/L (ref 9–46)
AST: 22 U/L (ref 10–35)
Albumin: 4.4 g/dL (ref 3.6–5.1)
Alkaline phosphatase (APISO): 83 U/L (ref 35–144)
BUN/Creatinine Ratio: 14 (calc) (ref 6–22)
BUN: 22 mg/dL (ref 7–25)
CO2: 30 mmol/L (ref 20–32)
Calcium: 10 mg/dL (ref 8.6–10.3)
Chloride: 101 mmol/L (ref 98–110)
Creat: 1.55 mg/dL — ABNORMAL HIGH (ref 0.70–1.28)
Globulin: 2.8 g/dL (calc) (ref 1.9–3.7)
Glucose, Bld: 81 mg/dL (ref 65–99)
Potassium: 4.6 mmol/L (ref 3.5–5.3)
Sodium: 138 mmol/L (ref 135–146)
Total Bilirubin: 0.5 mg/dL (ref 0.2–1.2)
Total Protein: 7.2 g/dL (ref 6.1–8.1)
eGFR: 47 mL/min/{1.73_m2} — ABNORMAL LOW (ref >=60)

## 2021-08-21 LAB — LIPID PANEL
Cholesterol: 152 mg/dL (ref <200)
HDL: 63 mg/dL (ref >=40)
LDL Cholesterol (Calc): 72 mg/dL (calc)
Non-HDL Cholesterol (Calc): 89 mg/dL (calc) (ref <130)
Total CHOL/HDL Ratio: 2.4 (calc) (ref <5.0)
Triglycerides: 91 mg/dL (ref <150)

## 2021-08-21 LAB — TSH: TSH: 0.9 mIU/L (ref 0.40–4.50)

## 2021-08-21 LAB — MAGNESIUM: Magnesium: 2.2 mg/dL (ref 1.5–2.5)

## 2021-09-09 DIAGNOSIS — R3915 Urgency of urination: Secondary | ICD-10-CM | POA: Diagnosis not present

## 2021-10-31 ENCOUNTER — Other Ambulatory Visit: Payer: Self-pay | Admitting: Adult Health Nurse Practitioner

## 2021-12-30 NOTE — Progress Notes (Unsigned)
    Future Appointments  Date Time Provider Department  12/31/2021 10:00 AM Unk Pinto, MD [facility]  01/30/2022          6 mo  OV  10:30 AM Unk Pinto, MD [facility]  05/14/2022        CPE 10:00 AM Alycia Rossetti, NP [facility]  09/03/2022          Wellness 11:00 AM Alycia Rossetti, NP [facility]    History of Present Illness:      The patient is a very nice 71 y.o.  MWM   with HTN, HLD, Pre-Diabetes and Vitamin D Deficiency.         Medications   Current Outpatient Medications (Cardiovascular):    atorvastatin (LIPITOR) 40 MG tablet, TAKE 1 TABLET BY MOUTH  DAILY FOR CHOLESTEROL   bisoprolol (ZEBETA) 5 MG tablet, TAKE 1 TABLET BY MOUTH  DAILY FOR BLOOD PRESSURE  Current Outpatient Medications (Respiratory):    ipratropium (ATROVENT) 0.03 % nasal spray, Place 2 sprays into the nose 3 (three) times daily.  Current Outpatient Medications (Analgesics):    aspirin EC 81 MG tablet, Take 81 mg by mouth See admin instructions. Take 1 tablet (81 mg) by mouth 3 times week - Sunday, Wednesday and Friday - with lunch   Current Outpatient Medications (Other):    buPROPion (WELLBUTRIN XL) 150 MG 24 hr tablet, TAKE 1 TABLET BY MOUTH  DAILY FOR MOOD, FOCUS, AND  CONCENTRATION   Cholecalciferol (VITAMIN D-3) 1000 UNITS CAPS, Take 5,000 Units by mouth daily.    mirabegron ER (MYRBETRIQ) 50 MG TB24 tablet, Take 1 tablet  Daily  for Bladder control   tamsulosin (FLOMAX) 0.4 MG CAPS capsule, Take 1 tablet at Bedtime for Prostate  Problem list He has Vitamin D deficiency; Essential hypertension; Gastroesophageal reflux disease; OAB (overactive bladder); Diverticulosis; Hyperlipidemia, mixed; COPD (chronic obstructive pulmonary disease) with chronic bronchitis ([place]); Generalized anxiety disorder; Thoracic aorta atherosclerosis ([place]) by Chest CT scan in 2017; Overweight (BMI 25.0-29.9); Former smoker (54 pack year); Chronic kidney disease, stage 3 ([place]); Pulmonary nodules; Medication  management; Abnormal glucose; Sepsis due to undetermined organism ([place]); Acute renal failure superimposed on stage 3b chronic kidney disease ([place]); Nausea and vomiting; Urinary tract infection without hematuria; and Enteritis due to Norovirus on their problem list.   Observations/Objective:  There were no vitals taken for this visit.  HEENT - WNL. Neck - supple.  Chest - Clear equal BS. Cor - Nl HS. RRR w/o sig MGR. PP 1(+). No edema. MS- FROM w/o deformities.  Gait Nl. Neuro -  Nl w/o focal abnormalities.   Assessment and Plan:      Follow Up Instructions:        I discussed the assessment and treatment plan with the patient. The patient was provided an opportunity to ask questions and all were answered. The patient agreed with the plan and demonstrated an understanding of the instructions.       The patient was advised to call back or seek an in-person evaluation if the symptoms worsen or if the condition fails to improve as anticipated.    Kirtland Bouchard, MD

## 2021-12-31 ENCOUNTER — Encounter: Payer: Self-pay | Admitting: Internal Medicine

## 2021-12-31 ENCOUNTER — Ambulatory Visit (INDEPENDENT_AMBULATORY_CARE_PROVIDER_SITE_OTHER): Payer: Medicare Other | Admitting: Internal Medicine

## 2021-12-31 VITALS — BP 127/70 | HR 53 | Temp 96.9°F | Resp 16 | Ht 67.5 in | Wt 187.6 lb

## 2021-12-31 DIAGNOSIS — L821 Other seborrheic keratosis: Secondary | ICD-10-CM | POA: Diagnosis not present

## 2021-12-31 DIAGNOSIS — D485 Neoplasm of uncertain behavior of skin: Secondary | ICD-10-CM

## 2021-12-31 DIAGNOSIS — I10 Essential (primary) hypertension: Secondary | ICD-10-CM | POA: Diagnosis not present

## 2021-12-31 NOTE — Patient Instructions (Addendum)
Seborrheic Keratosis A seborrheic keratosis is a common, noncancerous (benign) skin growth. These growths are velvety, waxy, rough, tan, brown, or black spots that appear on the skin. These skin growths can be flat or raised, and scaly. What are the causes? The cause of this condition is not known. What increases the risk? You are more likely to develop this condition if you: Have a family history of seborrheic keratosis. Are 50 or older. Are pregnant. Have had estrogen replacement therapy. What are the signs or symptoms? Symptoms of this condition include growths on the face, chest, shoulders, back, or other areas. These growths: Are usually painless, but may become irritated and itchy. Can be yellow, brown, black, or other colors. Are slightly raised or have a flat surface. Are sometimes rough or wart-like in texture. Are often velvety or waxy on the surface. Are round or oval-shaped. Often occur in groups, but may occur as a single growth. How is this diagnosed? This condition is diagnosed with a medical history and physical exam. A sample of the growth may be tested (skin biopsy). You may need to see a skin specialist (dermatologist). How is this treated? Treatment is not usually needed for this condition, unless the growths are irritated or bleed often. You may also choose to have the growths removed if you do not like their appearance. Most commonly, these growths are treated with a procedure in which liquid nitrogen is applied to "freeze" off the growth (cryosurgery). They may also be burned off with electricity (electrocautery) or removed by scraping (curettage). Follow these instructions at home: Watch your growth for any changes. Keep all follow-up visits as told by your health care provider. This is important. Do not scratch or pick at the growth or growths. This can cause them to become irritated or infected. Contact a health care provider if: You suddenly have many new  growths. Your growth bleeds, itches, or hurts. Your growth suddenly becomes larger or changes color. Summary A seborrheic keratosis is a common, noncancerous (benign) skin growth. Treatment is not usually needed for this condition, unless the growths are irritated or bleed often. Watch your growth for any changes. Contact a health care provider if you suddenly have many new growths or your growth suddenly becomes larger or changes color. Keep all follow-up visits as told by your health care provider. This is important.   Cryosurgery for Skin Conditions Cryosurgery, also called cryotherapy, is the use of extremely cold liquid (liquid nitrogen) to freeze and remove abnormal or diseased tissue. Cryosurgery may be used to remove certain growths on the skin, such as: Warts. Skin sores that could turn into cancer (precancerous skin lesions or actinic keratoses). Some skin cancers. Cryosurgery usually takes a few minutes, and it can be done in your health care provider's office. Tell a health care provider about: Any allergies you have. All medicines you are taking, including vitamins, herbs, eye drops, creams, and over-the-counter medicines. Any problems you or family members have had with anesthetic medicines. Any blood disorders you have. Any surgeries you have had. Any medical conditions you have. Whether you are pregnant or may be pregnant. What are the risks? Generally, this is a safe procedure. However, problems may occur, including: Infection. Bleeding. Scarring. Changes in skin color (lighter or darker than normal skin tone). Swelling. Hair loss in the treated area. Damage to nearby structures or organs, such as nerve damage and loss of feeling. This is rare. What happens before the procedure? No specific preparation is needed for  this procedure. Your health care provider will describe the procedure and will discuss the benefits and risks of the procedure with you. What happens  during the procedure?  Your procedure will be performed using one of the following methods: Your health care provider may apply a device (probe) to the skin. The probe has liquid nitrogen flowing through it to cool it down. The probe will be applied to the skin until the skin is frozen and destroyed. Your health care provider may apply liquid nitrogen to the skin with a swab or by spraying it on the skin until the skin is frozen and destroyed. The treated area may be covered with a bandage (dressing). These procedures may vary among health care providers and clinics. What can I expect after procedure? After your procedure, it is common to have redness, swelling, and a blister that forms over the treated area. The blister may contain a small amount of blood. You may also have some mild stinging or a burning sensation that will resolve. If a blister forms, it will break open on its own after about 2-4 weeks, leaving a scab. Then the treated area will heal. After healing, there is usually little or no scarring. Follow these instructions at home: Caring for the treated area  Follow instructions from your health care provider about how to take care of the treated area. If you have a dressing, make sure you: Wash your hands with soap and water for at least 20 seconds before and after you change your dressing. If soap and water are not available, use hand sanitizer. Change your dressing as told by your health care provider. Keep the dressing and the treated area clean and dry. If the dressing gets wet, change it right away. Clean the treated area with soap and water. Keep the area covered with a dressing until it heals, or for as long as told by your health care provider. Check the treated area every day for signs of infection. Check for: More redness, swelling, or pain. More fluid or blood. Warmth. Pus or a bad smell. If a blister forms, do not pick at your blister or try to break it open. Doing  this can cause infection and scarring. Do not apply any medicine, cream, or lotion to the treated area unless directed by your health care provider. General instructions Take over-the-counter and prescription medicines only as told by your health care provider. Do not use any products that contain nicotine or tobacco, such as cigarettes, e-cigarettes, and chewing tobacco. These can delay healing. If you need help quitting, ask your health care provider. Do not take baths, swim, use a hot tub, hand-wash dishes, or otherwise soak the treated area until your health care provider approves. Ask your health care provider if you may take showers. You may only be allowed to take sponge baths. Keep all follow-up visits as told by your health care provider. This is important. Contact a health care provider if: You have more redness, swelling, or pain around the treated area. You have more fluid or blood coming from the treated area. The treated area feels warm to the touch. You have pus or a bad smell coming from the treated area. Your blister becomes large and painful. Get help right away if: You have a fever and have redness spreading from the treated area. Summary Cryosurgery, also called cryotherapy, is the use of extreme cold (liquid nitrogen) to freeze and remove abnormal growths or diseased tissue. Cryosurgery usually takes a few  minutes, and it can be done in your health care provider's office. Generally, this is a safe procedure that requires no specific preparation beforehand. There are two different methods for performing cryosurgery. One method involves using a device (probe) to freeze the growth, and the other method involves applying liquid nitrogen directly to the growth. After treatment with cryotherapy, follow care instructions as provided by your health care provider. Watch for signs of infection. If a blister forms, do not pick at it or try to break it open. This information is not  intended to replace advice given to you by your health care provider. Make sure you discuss any questions you have with your health care provider. Document Revised: 01/26/2019 Document Reviewed: 01/26/2019 Elsevier Patient Education  [employer].

## 2022-01-29 ENCOUNTER — Telehealth: Payer: Self-pay | Admitting: Internal Medicine

## 2022-01-29 ENCOUNTER — Encounter: Payer: Self-pay | Admitting: Internal Medicine

## 2022-01-29 NOTE — Progress Notes (Unsigned)
Future Appointments  Date Time Provider Department  01/30/2022                6 month 10:30 AM Unk Pinto, MD [facility]  02/25/2022 10:00 AM Carleene Mains, [facility]  05/14/2022               cpe 10:00 AM Alycia Rossetti, NP [facility]  09/03/2022                wellness 11:00 AM Alycia Rossetti, NP [facility]    History of Present Illness:       This very nice 71 y.o.  MWM  presents for 6 month follow up with HTN, HLD, Pre-Diabetes and Vitamin D Deficiency.  Chest CT scan in 2017 showed Aortic atherosclerosis       Patient is treated for HTN  since 2012  & BP has been controlled at home. Today's BP is at goal -  130/72. Patient has had no complaints of any cardiac type chest pain, palpitations, dyspnea Vertell Limber /PND, dizziness, claudication, or dependent edema.       Hyperlipidemia is controlled with diet & Atorvastatin. Patient denies myalgias or other med SE's. Last Lipids were at goal :  Lab Results  Component Value Date   CHOL 152 08/20/2021   HDL 63 08/20/2021   LDLCALC 72 08/20/2021   TRIG 91 08/20/2021   CHOLHDL 2.4 08/20/2021     Also, the patient has history of PreDiabetes (A1c 5.8% & 6.3% /2011) and has had no symptoms of reactive hypoglycemia, diabetic polys, paresthesias or visual blurring.  Last A1c was normal & at goal:  Lab Results  Component Value Date   HGBA1C 5.5 05/14/2021         Further, the patient also has history of Vitamin D Deficiency ("31" /2013) and supplements vitamin D without any suspected side-effects. Last vitamin D was at goal:   Lab Results  Component Value Date   VD25OH 61 05/14/2021       Outpatient Encounter Medications as of 01/30/2022  Medication Sig   aspirin EC 81 MG tablet Take 1 tablet 3 times week - Sun, Wed &Fri   atorvastatin 40 MG tablet TAKE 1 TABLET   DAILY    bisoprolol  5 MG tablet TAKE 1 TABLET   DAILY    buPROPion XL 150 MG TAKE 1 TABLET   DAILY    VITAMIN D 1000 UNITS  Take 5,000 Units  daily.    Ipratropium  0.03 % nasal spray Place 2 sprays into the nose 3 (three) times daily.   mirabegron ER (MYRBETRIQ) 50 MG Take 1 tablet  Daily  for Bladder control   tamsulosin (FLOMAX) 0.4 MG CAPS  Take 1 tablet at Bedtime     No Known Allergies   PMHx:   Past Medical History:  Diagnosis Date   Acute appendicitis 11/24/2016   COPD (chronic obstructive pulmonary disease) (HCC)    GERD (gastroesophageal reflux disease)    Hematuria    HLD (hyperlipidemia)    Hypertension    IBS (irritable bowel syndrome)    Pre-diabetes    Vitamin D deficiency      Immunization History  Administered Date(s) Administered   Influenza Whole 04/07/2013   Influenza, High Dose   04/08/2018, 04/14/2019, 05/10/2020   Influenza 03/31/2017   PFIZER  SARS-COV-2 Vacc 07/15/2019, 08/04/2019, 03/28/2020   PPD Test 12/05/2013   Pneumococcal -13 06/08/2014   Pneumococcal -23 09/07/2014   Tdap 04/29/2011  Zoster, Live 06/12/2014     Past Surgical History:  Procedure Laterality Date   CARPAL TUNNEL RELEASE Right    CAT  EXT, BILAT  - Dr. Prudencio Burly Bilateral       CERVICAL FUSION     HAND SURGERY Left    LAPAROSCOPIC APPENDECTOMY N/A 11/24/2016   Clovis Riley, MD   TRIGGER FINGER RELEASE Right    thumb    FHx:    Reviewed / unchanged  SHx:    Reviewed / unchanged   Systems Review:  Constitutional: Denies fever, chills, wt changes, headaches, insomnia, fatigue, night sweats, change in appetite. Eyes: Denies redness, blurred vision, diplopia, discharge, itchy, watery eyes.  ENT: Denies discharge, congestion, post nasal drip, epistaxis, sore throat, earache, hearing loss, dental pain, tinnitus, vertigo, sinus pain, snoring.  CV: Denies chest pain, palpitations, irregular heartbeat, syncope, dyspnea, diaphoresis, orthopnea, PND, claudication or edema. Respiratory: denies cough, dyspnea, DOE, pleurisy, hoarseness, laryngitis, wheezing.  Gastrointestinal: Denies dysphagia, odynophagia,  heartburn, reflux, water brash, abdominal pain or cramps, nausea, vomiting, bloating, diarrhea, constipation, hematemesis, melena, hematochezia  or hemorrhoids. Genitourinary: Denies dysuria, frequency, urgency, nocturia, hesitancy, discharge, hematuria or flank pain. Musculoskeletal: Denies arthralgias, myalgias, stiffness, jt. swelling, pain, limping or strain/sprain.  Skin: Denies pruritus, rash, hives, warts, acne, eczema or change in skin lesion(s). Neuro: No weakness, tremor, incoordination, spasms, paresthesia or pain. Psychiatric: Denies confusion, memory loss or sensory loss. Endo: Denies change in weight, skin or hair change.  Heme/Lymph: No excessive bleeding, bruising or enlarged lymph nodes.  Physical Exam  There were no vitals taken for this visit.  Appears  well nourished, well groomed  and in no distress.  Eyes: PERRLA, EOMs, conjunctiva no swelling or erythema. Sinuses: No frontal/maxillary tenderness ENT/Mouth: EAC's clear, TM's nl w/o erythema, bulging. Nares clear w/o erythema, swelling, exudates. Oropharynx clear without erythema or exudates. Oral hygiene is good. Tongue normal, non obstructing. Hearing intact.  Neck: Supple. Thyroid not palpable. Car 2+/2+ without bruits, nodes or JVD. Chest: Respirations nl with BS clear & equal w/o rales, rhonchi, wheezing or stridor.  Cor: Heart sounds normal w/ regular rate and rhythm without sig. murmurs, gallops, clicks or rubs. Peripheral pulses normal and equal  without edema.  Abdomen: Soft & bowel sounds normal. Non-tender w/o guarding, rebound, hernias, masses or organomegaly.  Lymphatics: Unremarkable.  Musculoskeletal: Full ROM all peripheral extremities, joint stability, 5/5 strength and normal gait.  Skin: Warm, dry without exposed rashes, lesions or ecchymosis apparent.  Neuro: Cranial nerves intact, reflexes equal bilaterally. Sensory-motor testing grossly intact. Tendon reflexes grossly intact.  Pysch: Alert &  oriented x 3.  Insight and judgement nl & appropriate. No ideations.  Assessment and Plan:   1. Essential hypertension  - CBC with Differential/Platelet - COMPLETE METABOLIC PANEL WITH GFR - Magnesium - TSH  2. Hyperlipidemia, mixed  - Lipid panel - TSH  3. Abnormal glucose  - Hemoglobin A1c - Insulin, random  4. Vitamin D deficiency  - VITAMIN D 25 Hydroxy   5. Thoracic aorta atherosclerosis ([place]) by Chest CT scan in 2017  - Lipid panel  6. Medication management  - CBC with Differential/Platelet - COMPLETE METABOLIC PANEL WITH GFR - Magnesium - Lipid panel - TSH - Hemoglobin A1c - Insulin, random - VITAMIN D 25 Hydroxy          Discussed  regular exercise, BP monitoring, weight control to achieve/maintain BMI less than 25 and discussed med and SE's. Recommended labs to assess and monitor clinical status with further  disposition pending results of labs.  I discussed the assessment and treatment plan with the patient. The patient was provided an opportunity to ask questions and all were answered. The patient agreed with the plan and demonstrated an understanding of the instructions.  I provided over 30 minutes of exam, counseling, chart review and  complex critical decision making.         The patient was advised to call back or seek an in-person evaluation if the symptoms worsen or if the condition fails to improve as anticipated.   Kirtland Bouchard, MD

## 2022-01-29 NOTE — Progress Notes (Signed)
  Chronic Care Management   Outreach Note  01/29/2022 Name: [name] Park MRN: [id number] DOB: 1950-08-16  Referred by: Unk Pinto, MD Reason for referral : No chief complaint on file.   An unsuccessful telephone outreach was attempted today. The patient was referred to the pharmacist for assistance with care management and care coordination.   Follow Up Plan:   Tatjana Dellinger Upstream Scheduler

## 2022-01-29 NOTE — Patient Instructions (Signed)

## 2022-01-29 NOTE — Progress Notes (Signed)
  Chronic Care Management   Note  01/29/2022 Name: ABYAN CADMAN MRN: [id number] DOB: 03/16/1951  [place] Rabon is a 71 y.o. year old male who is a primary care patient of Unk Pinto, MD. I reached out to [employer] by phone today in response to a referral sent by Mr. Jasiah Elsen Chenard's PCP, Unk Pinto, MD.   Mr. Delap was given information about Chronic Care Management services today including:  CCM service includes personalized support from designated clinical staff supervised by his physician, including individualized plan of care and coordination with other care providers 24/7 contact phone numbers for assistance for urgent and routine care needs. Service will only be billed when office clinical staff spend 20 minutes or more in a month to coordinate care. Only one practitioner may furnish and bill the service in a calendar month. The patient may stop CCM services at any time (effective at the end of the month) by phone call to the office staff.   Patient agreed to services and verbal consent obtained.   Follow up plan:   Tatjana Secretary/administrator

## 2022-01-30 ENCOUNTER — Ambulatory Visit (INDEPENDENT_AMBULATORY_CARE_PROVIDER_SITE_OTHER): Payer: Medicare Other | Admitting: Internal Medicine

## 2022-01-30 ENCOUNTER — Encounter: Payer: Self-pay | Admitting: Internal Medicine

## 2022-01-30 VITALS — BP 108/62 | HR 65 | Temp 97.5°F | Resp 16 | Ht 68.0 in | Wt 187.8 lb

## 2022-01-30 DIAGNOSIS — R7309 Other abnormal glucose: Secondary | ICD-10-CM

## 2022-01-30 DIAGNOSIS — E782 Mixed hyperlipidemia: Secondary | ICD-10-CM | POA: Diagnosis not present

## 2022-01-30 DIAGNOSIS — Z79899 Other long term (current) drug therapy: Secondary | ICD-10-CM | POA: Diagnosis not present

## 2022-01-30 DIAGNOSIS — E559 Vitamin D deficiency, unspecified: Secondary | ICD-10-CM

## 2022-01-30 DIAGNOSIS — I700 Atherosclerosis of aorta: Secondary | ICD-10-CM | POA: Diagnosis not present

## 2022-01-30 DIAGNOSIS — I10 Essential (primary) hypertension: Secondary | ICD-10-CM

## 2022-01-30 MED ORDER — DEXAMETHASONE 4 MG PO TABS: ORAL_TABLET | ORAL | 0 refills | Status: DC

## 2022-01-30 MED ORDER — MONTELUKAST SODIUM 10 MG PO TABS: ORAL_TABLET | ORAL | 3 refills | Status: DC

## 2022-01-31 LAB — COMPLETE METABOLIC PANEL WITH GFR
AG Ratio: 1.8 (calc) (ref 1.0–2.5)
ALT: 18 U/L (ref 9–46)
AST: 22 U/L (ref 10–35)
Albumin: 4.2 g/dL (ref 3.6–5.1)
Alkaline phosphatase (APISO): 80 U/L (ref 35–144)
BUN/Creatinine Ratio: 14 (calc) (ref 6–22)
BUN: 21 mg/dL (ref 7–25)
CO2: 30 mmol/L (ref 20–32)
Calcium: 9.6 mg/dL (ref 8.6–10.3)
Chloride: 106 mmol/L (ref 98–110)
Creat: 1.5 mg/dL — ABNORMAL HIGH (ref 0.70–1.28)
Globulin: 2.4 g/dL (calc) (ref 1.9–3.7)
Glucose, Bld: 80 mg/dL (ref 65–99)
Potassium: 4.7 mmol/L (ref 3.5–5.3)
Sodium: 141 mmol/L (ref 135–146)
Total Bilirubin: 0.4 mg/dL (ref 0.2–1.2)
Total Protein: 6.6 g/dL (ref 6.1–8.1)
eGFR: 49 mL/min/{1.73_m2} — ABNORMAL LOW (ref >=60)

## 2022-01-31 LAB — CBC WITH DIFFERENTIAL/PLATELET
Absolute Monocytes: 655 cells/uL (ref 200–950)
Basophils Absolute: 47 cells/uL (ref 0–200)
Basophils Relative: 0.8 %
Eosinophils Absolute: 218 cells/uL (ref 15–500)
Eosinophils Relative: 3.7 %
HCT: 37.6 % — ABNORMAL LOW (ref 38.5–50.0)
Hemoglobin: 12.4 g/dL — ABNORMAL LOW (ref 13.2–17.1)
Lymphs Abs: 1363 cells/uL (ref 850–3900)
MCH: 30.3 pg (ref 27.0–33.0)
MCHC: 33 g/dL (ref 32.0–36.0)
MCV: 91.9 fL (ref 80.0–100.0)
MPV: 11.2 fL (ref 7.5–12.5)
Monocytes Relative: 11.1 %
Neutro Abs: 3617 cells/uL (ref 1500–7800)
Neutrophils Relative %: 61.3 %
Platelets: 258 10*3/uL (ref 140–400)
RBC: 4.09 10*6/uL — ABNORMAL LOW (ref 4.20–5.80)
RDW: 12.4 % (ref 11.0–15.0)
Total Lymphocyte: 23.1 %
WBC: 5.9 10*3/uL (ref 3.8–10.8)

## 2022-01-31 LAB — LIPID PANEL
Cholesterol: 155 mg/dL (ref <200)
HDL: 57 mg/dL (ref >=40)
LDL Cholesterol (Calc): 78 mg/dL (calc)
Non-HDL Cholesterol (Calc): 98 mg/dL (calc) (ref <130)
Total CHOL/HDL Ratio: 2.7 (calc) (ref <5.0)
Triglycerides: 115 mg/dL (ref <150)

## 2022-01-31 LAB — VITAMIN D 25 HYDROXY (VIT D DEFICIENCY, FRACTURES): Vit D, 25-Hydroxy: 51 ng/mL (ref 30–100)

## 2022-01-31 LAB — HEMOGLOBIN A1C
Hgb A1c MFr Bld: 5.5 % of total Hgb (ref <5.7)
Mean Plasma Glucose: 111 mg/dL
eAG (mmol/L): 6.2 mmol/L

## 2022-01-31 LAB — INSULIN, RANDOM: Insulin: 13.3 u[IU]/mL

## 2022-01-31 LAB — TSH: TSH: 1.2 mIU/L (ref 0.40–4.50)

## 2022-01-31 LAB — MAGNESIUM: Magnesium: 2.3 mg/dL (ref 1.5–2.5)

## 2022-01-31 NOTE — Progress Notes (Signed)
<><><><><><><><><><><><><><><><><><><><><><><><><><><><><><><><><> <><><><><><><><><><><><><><><><><><><><><><><><><><><><><><><><><> -   Test results slightly outside the reference range are not unusual. If there is anything important, I will review this with you,  otherwise it is considered normal test values.  If you have further questions,  please do not hesitate to contact me at the office or via My Chart.  <><><><><><><><><><><><><><><><><><><><><><><><><><><><><><><><><> <><><><><><><><><><><><><><><><><><><><><><><><><><><><><><><><><>  -  Kidney functions Still Stage 3a & Stable  -  Great ! <><><><><><><><><><><><><><><><><><><><><><><><><><><><><><><><><>  -  Total Chol = 155  &  LDL  Chol = 78     - Both  Excellent   - Very low risk for Heart Attack  / Stroke <><><><><><><><><><><><><><><><><><><><><><><><><><><><><><><><><>  -  A1c - Normal - No Diabetes  - Great  ! <><><><><><><><><><><><><><><><><><><><><><><><><><><><><><><><><>  -  Vitamin D = 51  - is a little low   - Vitamin D goal is between 70-100.   - Please INCREASE  your Vitamin D to 10,000 units  /day   - It is very important as a natural anti-inflammatory and helping the  immune system protect against viral infections, like the Covid-19    helping hair, skin, and nails, as well as reducing stroke and  heart attack risk.   - It helps your bones and helps with mood.  - It also decreases numerous cancer risks so please  take it as directed.   - Low Vit D is associated with a 200-300% higher risk for  CANCER   and 200-300% higher risk for HEART   ATTACK  &  STROKE.    - It is also associated with higher death rate at younger ages,   autoimmune diseases like Rheumatoid arthritis, Lupus,  Multiple Sclerosis.     - Also many other serious conditions, like depression, Alzheimer's  Dementia, infertility, muscle aches, fatigue, fibromyalgia    <><><><><><><><><><><><><><><><><><><><><><><><><><><><><><><><><> <><><><><><><><><><><><><><><><><><><><><><><><><><><><><><><><><>  -  All Else - CBC - Kidneys - Electrolytes - Liver - Magnesium & Thyroid    - all  Normal / OK  <><><><><><><><><><><><><><><><><><><><><><><><><><><><><><><><><> <><><><><><><><><><><><><><><><><><><><><><><><><><><><><><><><><>

## 2022-02-10 DIAGNOSIS — H26493 Other secondary cataract, bilateral: Secondary | ICD-10-CM | POA: Diagnosis not present

## 2022-02-10 DIAGNOSIS — Z961 Presence of intraocular lens: Secondary | ICD-10-CM | POA: Diagnosis not present

## 2022-02-20 ENCOUNTER — Telehealth: Payer: Self-pay

## 2022-02-20 NOTE — Telephone Encounter (Signed)
LM-02/20/22-Called pt. To confirm initial CP office visit for 9/5 at 10:00AM. Unable to reach pt. [place] X1.  Total time spent: 2 min

## 2022-02-20 NOTE — Telephone Encounter (Signed)
LM-02/20/22-Chart Prep started, Reviewing OV, Consults, Hospital visits, Labs and medication changes.  Chart prep complete. Medicare cost analysis not performed d/t pt. Using mail order pharmacy. Patient data update form complete.  Total time spent: 30 min.

## 2022-02-20 NOTE — Telephone Encounter (Signed)
LM-02/20/22-Pt. Returned call and confirmed CP initial office visit for 9/5 at 10:00AM. Pt. Aware to bring medication (bottles) to appointment. Pt. Has no health concerns he'd like to discuss w/ CP at present.  Total time spent:

## 2022-02-20 NOTE — Telephone Encounter (Signed)
LM-02/20/22-Care plan completed for CP visit on 9/5.  Total time spent: 10 min.

## 2022-02-25 ENCOUNTER — Telehealth: Payer: Self-pay

## 2022-02-25 ENCOUNTER — Ambulatory Visit: Payer: Medicare Other | Admitting: Pharmacy Technician

## 2022-02-25 DIAGNOSIS — E663 Overweight: Secondary | ICD-10-CM

## 2022-02-25 DIAGNOSIS — E782 Mixed hyperlipidemia: Secondary | ICD-10-CM

## 2022-02-25 DIAGNOSIS — I10 Essential (primary) hypertension: Secondary | ICD-10-CM

## 2022-02-25 DIAGNOSIS — Z79899 Other long term (current) drug therapy: Secondary | ICD-10-CM

## 2022-02-25 NOTE — Telephone Encounter (Signed)
LM-02/25/22-CP initial visit notes uploaded to pts. Documents.  Total time spent: 3 min. 

## 2022-02-26 NOTE — Progress Notes (Signed)
Initial Pharmacist Visit (CCM)  Park,[name]  63 years, Male  DOB: 21-Jan-1951  M:   __________________________________________________ Patient scheduled for CCM visit with the clinical pharmacist.  Patient is referred for CCM by their PCP and Clinical Pharmacist is under general PCP supervision.: At least 2 of these conditions are expected to last 12 months or longer and patient is at significant risk for acute exacerbations and/or functional decline.  Patient has consented to participation in [place] program. Visit Type: Clinic visit Date of Upcoming Visit: 02/25/2022  Patient's Chronic Conditions: Hypertension (HTN), Chronic Obstructive Pulmonary Disease (COPD), Gastroesophageal Reflux Disease (GERD), Chronic Kidney Disease (CKD), Anxiety, Hyperlipidemia/Dyslipidemia (HLD), Vitamins/Supplements, Overactive Bladder (OAB), Diabetes (DM), Other List Other Conditions (separated by comma): Vitamin D deficiency, Overweight  Summary for PCP:  1. LDL above goal of 70. Patient only taking Lipitor 3x/week. PCP consider changing to daily. 2. Patient has COPD in problem list but no formal PFT to diagnose. PCP consider ordering PFT or removing COPD from problem list. 3. Missing diagnosis of BPH and allergies based on medications.  Doctor and Hospital Visits Were there PCP Visits in last 6 months?: Yes PCP Visits details: 12/31/21-Dr. Melford Aase- Pt. presented for BP recheck and c/o tender skin lesion. BP 127/70 HR 53. No medication changes noted,  01/30/22-Dr. Melford Aase- Pt. presented for f/u. BP 108/62 HR 65. STARTED Dexamethasone 4 MG Take 1 tab 3 x day for  3 days, then 2 x day for  3 days, then 1 tab daily Montelukast Sodium 10 MG Take  1 tablet  Daily  for Allergies & Asthma  Were there Specialist Visits in last 6 months?: Yes Specialist Visits details: 09/09/21-Gay, Bonna Gains (Urology)- No information available. Was there a Hospital Visit in last 30 days?: No Were there other Hospital Visits in last 6  months?: No Medicare cost analysis not performed- Pt. uses [facility]  Subjective Information Visit Completed on: 02/25/2022 Did patient bring medications to appointment?: Yes Subjective: Very sweet gentleman presenting in office for CCM initial visit. Patient feels great and is able to do what he wants. No complaints today. Lifestyle habits: Diet: No formal diet Exercise: No formal exercise. Stays active in the yard. Walks daily when its cooler. Tobacco: Formar smoker quit in 2005 Alcohol: None Caffeine: Coffee when its cold Recreational drugs: None What is the patient's sleep pattern?: Trouble staying asleep How many hours per night does patient typically sleep?: 5-6  SDOH: [place] Needs Screening Tool ([email or web address]) SDOH questions were documented and reviewed (EMR or [employer]) within the past 12 months or since hospitalization?: No What is your living situation today? (ref #1): I have a steady place to live Think about the place you live. Do you have problems with any of the following? (ref #2): None of the above Within the past 12 months, you worried that your food would run out before you got money to buy more (ref #3): Never true Within the past 12 months, the food you bought just didn't last and you didn't have money to get more (ref #4): Never true In the past 12 months, has lack of reliable transportation kept you from medical appointments, meetings, work or from getting things needed for daily living? (ref #5): No In the past 12 months, has the electric, gas, oil, or water company threatened to shut off services in your home? (ref #6): No How often does anyone, including family and friends, physically hurt you? (ref #7): Never (1) How often does anyone, including  family and friends, insult or talk down to you? (ref #8): Never (1) How often does anyone, including  friends and family, threaten you with harm? (ref #9): Never (1) How often does anyone, including family and friends, scream or curse at you? (ref #10): Never (1)  Medication Adherence Does the [facility] have access to medication refill history?: Yes Medication adherence rates for STAR metric medications:  Atorvastatin '40mg'$ - 02/10/21 (90DS), 10/31/21 (90DS)  Medication adherence rates for non-STAR metric medications:  Bisoprolol '5mg'$ - 07/20/21 (90DS), 10/31/21 (90DS) Bupropion '150mg'$ - 05/05/21 (90DS), 07/20/21 (90DS) Ipratropium Bromide 0.03%- 08/20/21 (30DS), 09/16/21 (30DS) Mirabegron '25mg'$ - 03/15/21 (90DS), 06/10/21 (90DS) Tamsulosin 0.'4mg'$ - 07/20/21 (90DS), 10/31/21 (90DS)  Name and location of Current pharmacy: [facility] Current Rx insurance plan: [employer] Are meds synced by current pharmacy?: No Are meds delivered by current pharmacy?: Yes - by mail order pharmacy Would patient benefit from direct intervention of clinical lead in dispensing process to optimize clinical outcomes?: No Are [facility] pharmacy services available where patient lives?: Yes Is patient disadvantaged to use [facility]?: Yes Does patient experience delays in picking up medications due to transportation concerns (getting to pharmacy)?: No Medication organization: [facility] Assessment:: Adherent  Hypertension (HTN) Most Recent BP: 127/76 Most Recent HR: 53 taken on: 12/31/2021 Care Gap: Need BP documented or last BP 140/90 or higher: Addressed Assessed today?: Yes Goal: <130/80 mmHG Is Patient checking BP at home?: Yes Patient home BP readings are ranging: 120s/70s Has patient experienced hypotension, dizziness, falls or bradycardia?: No We discussed: DASH diet:  following a diet emphasizing fruits and vegetables and low-fat dairy products along with whole grains, fish, poultry, and nuts. Reducing red meats and sugars., Reducing the amount of salt intake to '1500mg'$ /per day., Weight reduction- We discussed losing 5-10% of body  weight., Proper Home BP Measurement, Hypertension pathophysiology, complications, treatment goals, and management with patient for 10-15 minutes, Increasing movement, Increasing exercise (walking, biking, swimming) to a goal of 30 minutes per day, as able based on current activity level and health or as directed by your healthcare provider., Contacting PCP office for signs and symptoms of high or low blood pressure (hypotension, dizziness, falls, headaches, edema) Assessment:: Controlled Drug: Bisoprolol '5mg'$ - 1 tab QD Pharmacist Assessment: Appropriate, Effective, Safe, Accessible  Hyperlipidemia/Dyslipidemia (HLD) Last Lipid panel on: 01/30/2022 TC (Goal<200): 155 LDL: 78 HDL (Goal>40): 57 TG (Goal<150): 115 ASCVD 10-year risk?is:: High (>20%) ASCVD Risk Score: 35.2% Assessed today?: Yes LDL Goal: <70 We discussed: Complications of hyperlipidemia and prevention methods with patient for 5-10 minutes, Increasing exercise (walking, biking, swimming) to a goal of 30 minutes per day, as able based on current activity level and health or as directed by your healthcare provider, How a diet high in fruits/vegetables/nuts/whole grains/beans may help to reduce your cholesterol. Increasing soluble fiber intake.  Avoiding sugary foods and trans fat, limiting carbohydrates, and reducing portion sizes. Recommended increasing intake of healthy fats into their diet, Weight reduction- We discussed losing 5-10% of body weight Assessment:: Uncontrolled Drug: Atorvastatin '40mg'$ - 1 tab three times weekly Pharmacist Assessment: Appropriate, Query Effectiveness Plan/Follow up: Take Atorvastatin as directed daily  Diabetes (DM) Most recent A1C: 5.5 taken on: 01/30/2022 Previous A1C: 5.5 taken on: 05/14/2021 Most Recent GFR: 49 taken on: 01/30/2022 Type: Pre-Diabetes/Impaired Fasting Glucose Assessed today?: No Drug: None  Chronic Obstructive Pulmonary Disease (COPD) Most recent FEV1/FVC: Unknown Most  recent FEV1: Unknown Most recent Eosinophils: 218 taken on: 01/30/2022 Assessed today?: No  Anxiety Most Recent GAD-7 Score: Unknown Assessed today?: No Drug: Bupropion '150mg'$ -  1 tab QD Pharmacist Assessment: Appropriate, Effective, Safe, Accessible  GERD Assessed today?: No Drug: None  Chronic kidney disease (CKD) Most Recent GFR: 49 taken on: 01/30/2022 Previous GFR: 47 taken on: 08/20/2021 Most recent microalbumin ratio: 14 tested on: 05/14/2021 Assessed today?: Yes CKD Stage: Stage 3a (GFR 45-60 mL/min) Albuminuria Stage: A1 (<30) Contributing factors for developing CKD: Diabetes, HTN Is Patient taking statin medication: Yes Is patient taking ACEi / ARB?: No Renal dose adjustments recommended?: No We discussed: Limiting dietary sodium intake to less than 2000 mg / day, Maintaining blood pressure control, Maintaining blood glucose control, Avoidance of nephrotoxic drugs (NSAIDs) Assessment:: Controlled Drug: None Plan/Follow up: Increase water intake  Overactive Bladder (OAB) Assessed today?: No Drug: Mirabegron ER '50mg'$ - 1 tab QD Pharmacist Assessment: Appropriate, Effective, Safe, Accessible  Vitamins / Supplements We discussed: Confirming with pharmacist the safety of new vitamins / supplements with current medications before starting / adding to medication regimen Drug: Aspirin '81mg'$ - 1 tab 3 times a week- Sun, Wed, & Fri w/ lunch Pharmacist Assessment: Appropriate, Effective, Safe, Accessible Drug: Vitamin D3- take 5,000U QD Pharmacist Assessment: Appropriate, Effective, Safe, Accessible  Preventative Health Care Gap: Colorectal cancer screening: Needs to be addressed Care Gap: Annual Wellness Visit (AWV): Addressed Immunizations needed: Zoster, COVID-19, Influenza Additional exercise counseling points. We discussed: aiming to lose 5 to 10% of body weight through lifestyle modifications, targeting at least 150 minutes per week of moderate-intensity aerobic  exercise., incorporating flexibility, balance, and strength training exercises, encouraging participation in insurance-covered exercise program through local gym (i.e. [employer]), decreasing sedentary behavior Additional diet counseling points. We discussed: key components of the DASH diet, key components of a low-carb eating plan, aiming to consume at least 8 cups of water day  CPP Prep: 50mn CPP OV: 28m CPP Doc: [id number] Care Plan: 2mi58mNext CCM Follow Up: CCS Transfer Next AWV: 09/03/22 at 11:00AM Next PCP Visit: 05/14/22 at 10:00AM  Attestation Statement:: CCM Services:  This encounter meets complex CCM services and moderate to high medical decision making.  Prior to outreach and patient consent for Chronic Care Management, I referred this patient for services after reviewing the nominated patient list or from a personal encounter with the patient.  I have personally reviewed this encounter including the documentation in this note and have collaborated with the care management provider regarding care management and care coordination activities to include development and update of the comprehensive care plan I am certifying that I agree with the content of this note and encounter as supervising physician. Pharmacy Interventions Intervention Details Pharmacist Interventions discussed: Yes Discontinued Therapy: No therapeutic indication Education: Lifestyle modifications  AverMarda StalkerarmD Clinical Pharmacist AverNaida Sleightton'@[facility]'$ .care [phone]

## 2022-03-04 IMAGING — CR DG CHEST 2V
2 series · 2 of 2 positions shown · non-contrast
Comparison: 09/02/2013

CLINICAL DATA: Former smoker

EXAM:
CHEST - 2 VIEW

[w chest pa]
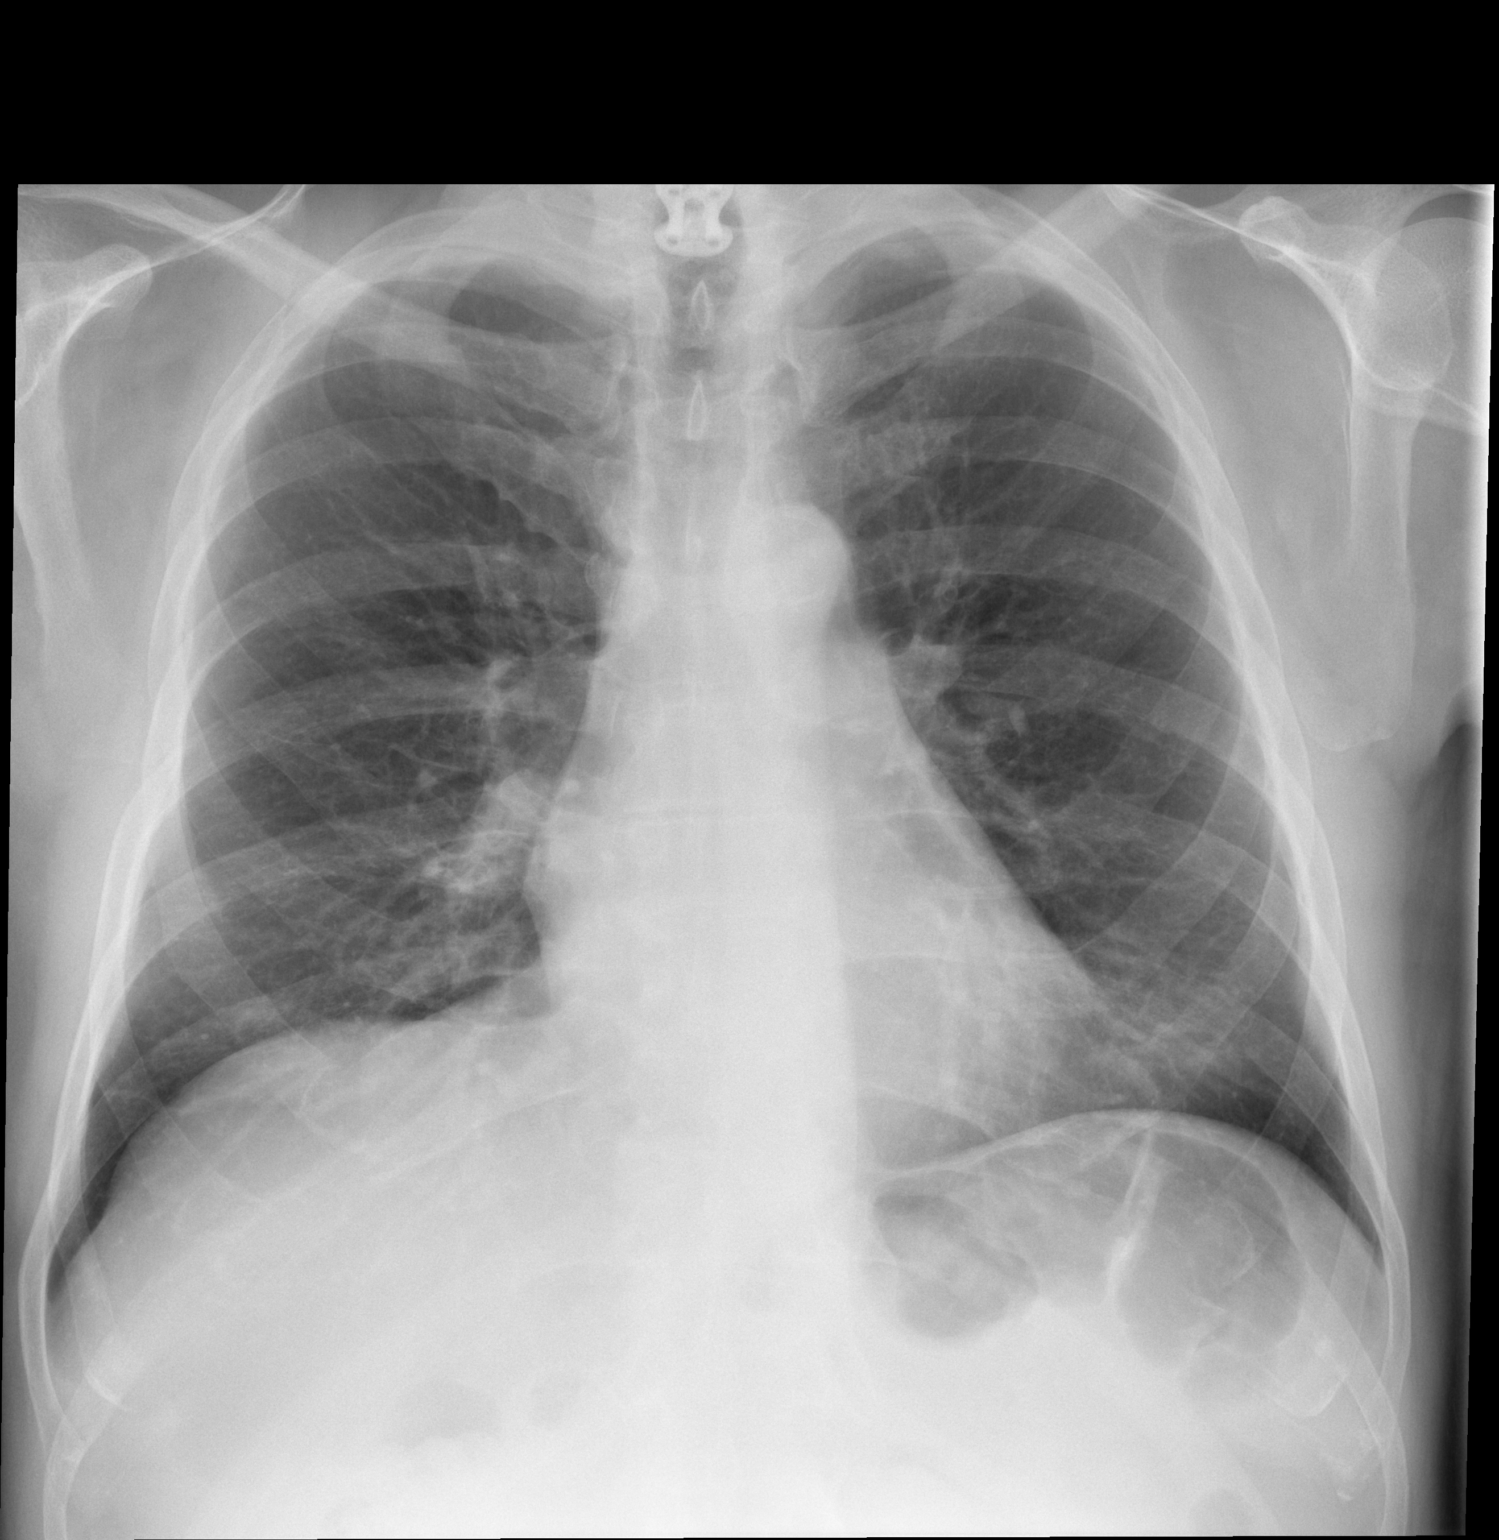

[w chest lat]
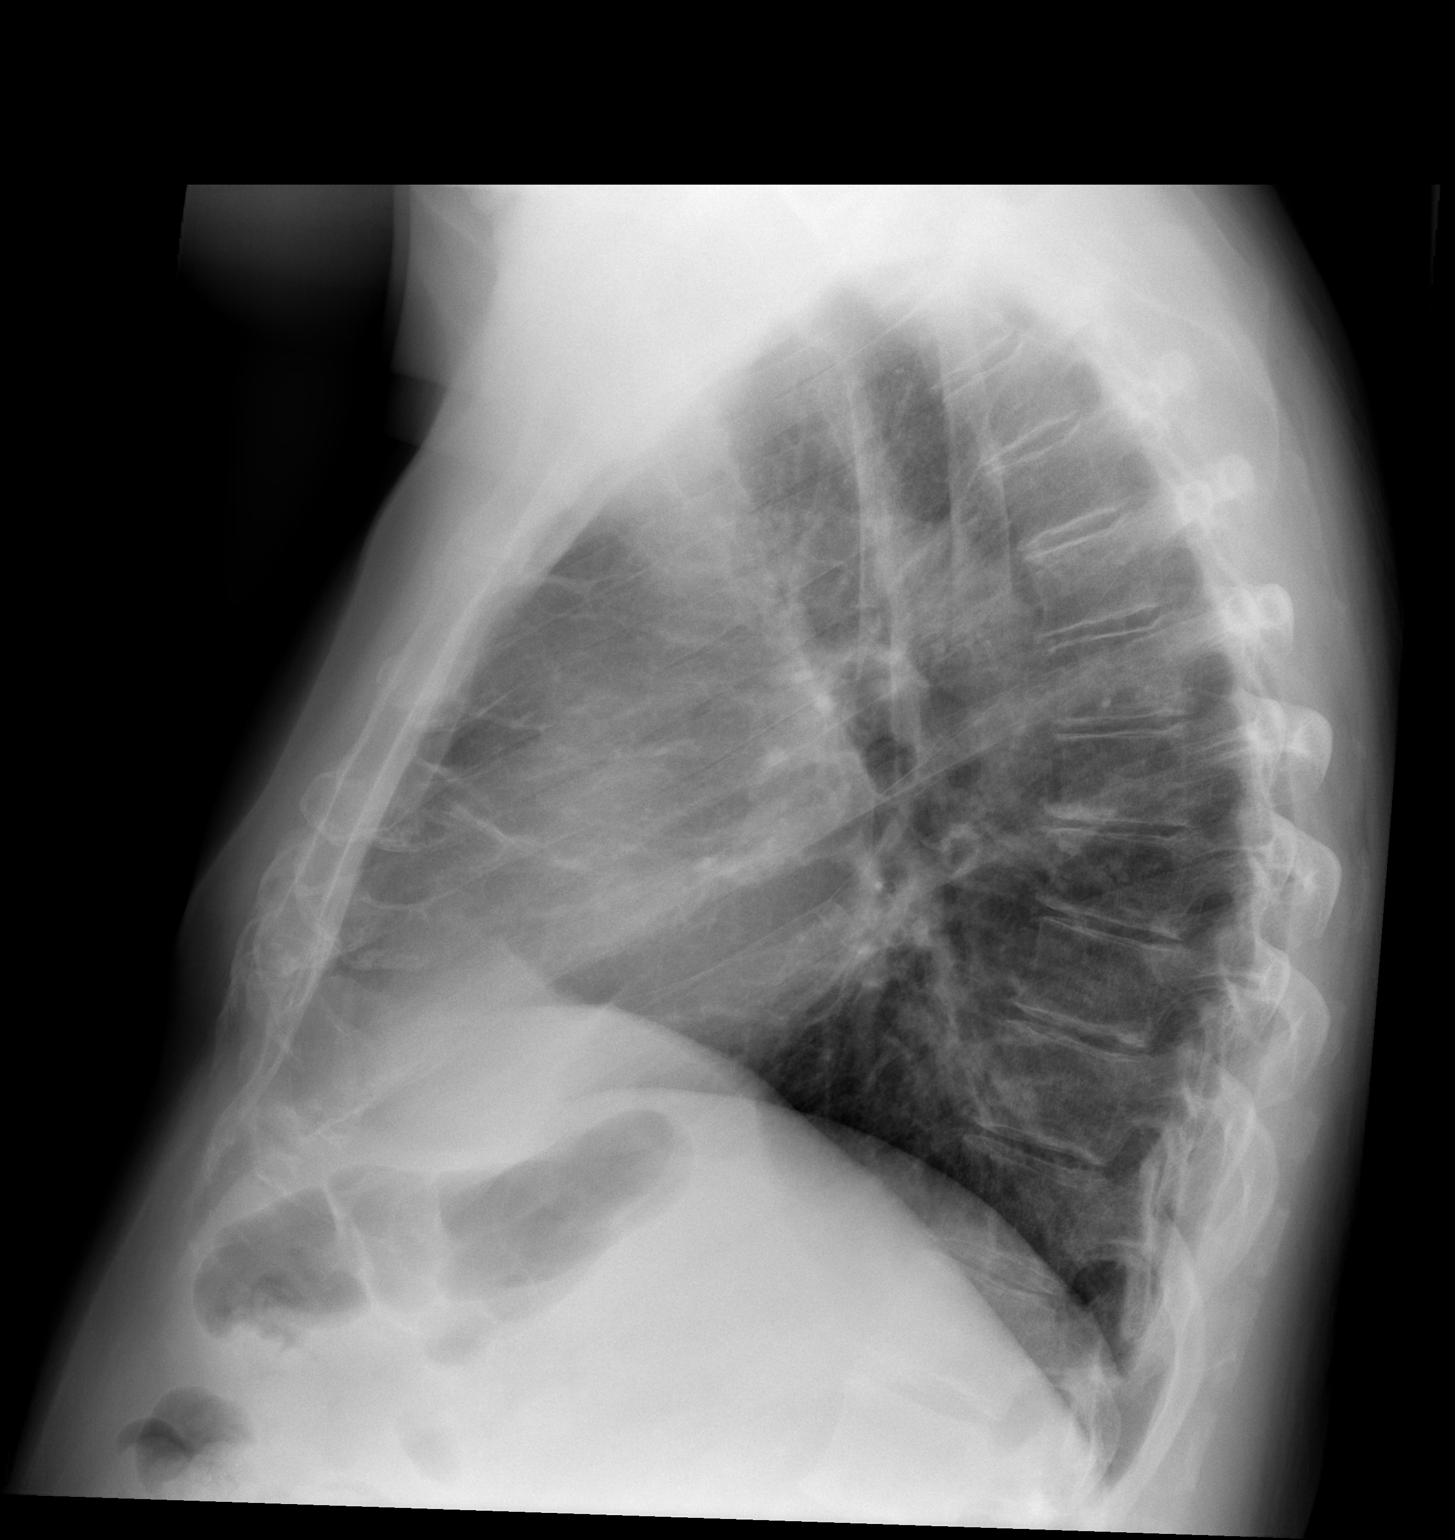

[2 of 2 positions shown; findings below may reference images not displayed]

FINDINGS: The heart size and mediastinal contours are within normal limits.
Both lungs are clear. The visualized skeletal structures are
unremarkable. Similar bibasilar atelectasis and or scarring. Aorta
atherosclerotic. Lower cervical fusion hardware noted.
IMPRESSION: No active cardiopulmonary disease.

## 2022-03-18 DIAGNOSIS — R3915 Urgency of urination: Secondary | ICD-10-CM | POA: Diagnosis not present

## 2022-03-18 DIAGNOSIS — N3281 Overactive bladder: Secondary | ICD-10-CM | POA: Diagnosis not present

## 2022-03-22 DIAGNOSIS — E782 Mixed hyperlipidemia: Secondary | ICD-10-CM

## 2022-03-22 DIAGNOSIS — E663 Overweight: Secondary | ICD-10-CM

## 2022-03-22 DIAGNOSIS — I10 Essential (primary) hypertension: Secondary | ICD-10-CM

## 2022-04-14 ENCOUNTER — Encounter: Payer: Self-pay | Admitting: Internal Medicine

## 2022-04-15 ENCOUNTER — Ambulatory Visit (INDEPENDENT_AMBULATORY_CARE_PROVIDER_SITE_OTHER): Payer: Medicare Other | Admitting: Nurse Practitioner

## 2022-04-15 ENCOUNTER — Encounter: Payer: Self-pay | Admitting: Nurse Practitioner

## 2022-04-15 VITALS — BP 140/70 | HR 80 | Temp 97.1°F | Ht 68.0 in | Wt 194.2 lb

## 2022-04-15 DIAGNOSIS — R0981 Nasal congestion: Secondary | ICD-10-CM | POA: Diagnosis not present

## 2022-04-15 DIAGNOSIS — R051 Acute cough: Secondary | ICD-10-CM

## 2022-04-15 DIAGNOSIS — J00 Acute nasopharyngitis [common cold]: Secondary | ICD-10-CM | POA: Diagnosis not present

## 2022-04-15 MED ORDER — AZITHROMYCIN 500 MG PO TABS: 500.0000 mg | ORAL_TABLET | Freq: Every day | ORAL | 0 refills | Status: AC

## 2022-04-15 MED ORDER — PROMETHAZINE-DM 6.25-15 MG/5ML PO SYRP: 2.5000 mL | ORAL_SOLUTION | Freq: Two times a day (BID) | ORAL | 0 refills | Status: DC | PRN

## 2022-04-15 NOTE — Progress Notes (Signed)
Assessment and Plan:  [name] Park was seen today for an episodic visit.  Diagnoses and all order for this visit:  1. Acute nasopharyngitis Stay well hydrated. Start Azithromycin as directed.  2. Nasal congestion Continue Mucinex   3. Acute cough Start Promethazine cough syrtup  Meds ordered this encounter  Medications   azithromycin (ZITHROMAX) 500 MG tablet    Sig: Take 1 tablet (500 mg total) by mouth daily for 10 days. Take 1 tablet daily for 3 days.    Dispense:  10 tablet    Refill:  0    Order Specific Question:   Supervising Provider    Answer:   Unk Pinto [phone]   promethazine-dextromethorphan (PROMETHAZINE-DM) 6.25-15 MG/5ML syrup    Sig: Take 2.5 mLs by mouth 2 (two) times daily as needed for cough.    Dispense:  240 mL    Refill:  0    Order Specific Question:   Supervising Provider    Answer:   Unk Pinto [phone]    Continue to monitor for any increase in fever, chills, N/V, difficulty breathing.  Notify office for further evaluation and treatment, questions or concerns if s/s fail to improve. The risks and benefits of my recommendations, as well as other treatment options were discussed with the patient today. Questions were answered.  Further disposition pending results of labs. Discussed med's effects and SE's.    Over 15 minutes of exam, counseling, chart review, and critical decision making was performed.   Future Appointments  Date Time Provider [place]  05/14/2022 10:00 AM Alycia Rossetti, NP [facility] None  09/03/2022 11:00 AM Alycia Rossetti, NP [facility] None    ------------------------------------------------------------------------------------------------------------------   HPI BP (!) 140/70   Pulse 80   Temp (!) 97.1 F (36.2 C)   Ht '5\' 8"'$  (1.727 m)   Wt 194 lb 3.2 oz (88.1 kg)   SpO2 98%   BMI 29.53 kg/m    Patient complains of symptoms of a URI, possible sinusitis. Symptoms include congestion and  cough described as productive. Onset of symptoms was 3 days ago, and has been unchanged since that time. Treatment to date: decongestants.  Denies fever, chills, N/V.      Past Medical History:  Diagnosis Date   Acute appendicitis 11/24/2016   COPD (chronic obstructive pulmonary disease) (HCC)    GERD (gastroesophageal reflux disease)    Hematuria    HLD (hyperlipidemia)    Hypertension    IBS (irritable bowel syndrome)    Pre-diabetes    Vitamin D deficiency      No Known Allergies  Current Outpatient Medications on File Prior to Visit  Medication Sig   aspirin EC 81 MG tablet Take 81 mg by mouth See admin instructions. Take 1 tablet (81 mg) by mouth 3 times week - Sunday, Wednesday and Friday - with lunch   atorvastatin (LIPITOR) 40 MG tablet TAKE 1 TABLET BY MOUTH  DAILY FOR CHOLESTEROL   bisoprolol (ZEBETA) 5 MG tablet TAKE 1 TABLET BY MOUTH  DAILY FOR BLOOD PRESSURE   buPROPion (WELLBUTRIN XL) 150 MG 24 hr tablet TAKE 1 TABLET BY MOUTH  DAILY FOR MOOD, FOCUS, AND  CONCENTRATION   Cholecalciferol (VITAMIN D-3) 1000 UNITS CAPS Take 5,000 Units by mouth daily.    mirabegron ER (MYRBETRIQ) 50 MG TB24 tablet Take 1 tablet  Daily  for Bladder control   montelukast (SINGULAIR) 10 MG tablet Take  1 tablet  Daily  for Allergies & Asthma   tamsulosin (  FLOMAX) 0.4 MG CAPS capsule Take 1 tablet at Bedtime for Prostate   No current facility-administered medications on file prior to visit.    ROS: all negative except what is noted in the HPI.   Physical Exam:  BP (!) 140/70   Pulse 80   Temp (!) 97.1 F (36.2 C)   Ht '5\' 8"'$  (1.727 m)   Wt 194 lb 3.2 oz (88.1 kg)   SpO2 98%   BMI 29.53 kg/m   General Appearance: NAD.  Awake, conversant and cooperative. Eyes: PERRLA, EOMs intact.  Sclera white.  Conjunctiva without erythema. Sinuses: No frontal/maxillary tenderness.  No nasal discharge. Nares patent.  ENT/Mouth: Ext aud canals clear.  Bilateral TMs w/DOL and without erythema or  bulging. Hearing intact.  Posterior pharynx without swelling or exudate.  Tonsils without swelling or erythema.  Neck: Supple.  No masses, nodules or thyromegaly. Respiratory: Effort is regular with non-labored breathing. Breath sounds are equal bilaterally without rales, rhonchi, wheezing or stridor.  Cardio: RRR with no MRGs. Brisk peripheral pulses without edema.  Abdomen: Active BS in all four quadrants.  Soft and non-tender without guarding, rebound tenderness, hernias or masses. Lymphatics: Non tender without lymphadenopathy.  Musculoskeletal: Full ROM, 5/5 strength, normal ambulation.  No clubbing or cyanosis. Skin: Appropriate color for ethnicity. Warm without rashes, lesions, ecchymosis, ulcers.  Neuro: CN II-XII grossly normal. Normal muscle tone without cerebellar symptoms and intact sensation.   Psych: AO X 3,  appropriate mood and affect, insight and judgment.     Darrol Jump, NP 4:03 PM [facility]

## 2022-05-12 DIAGNOSIS — L578 Other skin changes due to chronic exposure to nonionizing radiation: Secondary | ICD-10-CM | POA: Diagnosis not present

## 2022-05-12 DIAGNOSIS — D2371 Other benign neoplasm of skin of right lower limb, including hip: Secondary | ICD-10-CM | POA: Diagnosis not present

## 2022-05-12 DIAGNOSIS — L821 Other seborrheic keratosis: Secondary | ICD-10-CM | POA: Diagnosis not present

## 2022-05-12 DIAGNOSIS — D1801 Hemangioma of skin and subcutaneous tissue: Secondary | ICD-10-CM | POA: Diagnosis not present

## 2022-05-12 DIAGNOSIS — L814 Other melanin hyperpigmentation: Secondary | ICD-10-CM | POA: Diagnosis not present

## 2022-05-12 DIAGNOSIS — L570 Actinic keratosis: Secondary | ICD-10-CM | POA: Diagnosis not present

## 2022-05-12 DIAGNOSIS — D485 Neoplasm of uncertain behavior of skin: Secondary | ICD-10-CM | POA: Diagnosis not present

## 2022-05-13 NOTE — Progress Notes (Unsigned)
Complete Physical Exam  Assessment / Plan:    Encounter for general adult medical examination with abnormal findings Yearly  Essential hypertension - continue medications, DASH diet, exercise and monitor at home. Call if greater than 130/80.  -     CBC with Differential/Platelet -     CMP/GFR -     TSH  Mixed hyperlipidemia Continue medications: Atorvastatin 44m, three days a week Discussed dietary and exercise modifications Low fat diet - Lipid panel  Thoracic aorta atherosclerosis [place] Per numerous CXR/CTs 10/15/19 Control blood pressure, cholesterol, glucose, increase exercise.   COPD (chronic obstructive pulmonary disease) with chronic bronchitis (HCC) Former smoker, dx per imaging, dyspnea with exertion Continue to monitor symptoms  Chronic kidney disease, stage 3 (HCC) Increase fluids, avoid NSAIDS, monitor sugars, will monitor -     CMP WITH GFR  Medication management -     Magnesium  Edema Worsening over the past several months with more shortness of breath Encouraged compression stockings Refer to cardiology  Rhinorrhea Azelastine nasal spray BID Generic zyrtec or Allegra  Other abnormal glucose Discussed disease progression and risks Discussed diet/exercise, weight management and risk modification - A1c  Vitamin D deficiency Continue supplement - Vit D  Generalized anxiety disorder Continue wellbutrin Continue diet, exercise, practice good sleep hygiene  Overweight (BMI 25.0-29.9) - long discussion about weight loss, diet, and exercise -recommended diet heavy in fruits and veggies and low in animal meats, cheeses, and dairy products  54 pack year smoking history/lung nodules 54 pack, no symptoms, quit 15 years ago Last year he was advised he could not get insurance to cover the Low Dose CT, will order CXR  Screening for Ischemic Heart Disease EKG  Screening for AAA - ABD U/S Retro LTD  Screening for hematuria or proteinuria UA routine  with reflex microscopic Microalbumin/creatinine urine ratio  Screening for prostate CA - PSA  Flu Vaccine Need High Dose Flu vaccine given   Screening for thyroid disorder - TSH       Future Appointments  Date Time Provider [place] 09/03/2022 11:00 AM WAlycia Rossetti NP [facility] None  05/15/2023 10:00 AM WAlycia Rossetti NP [facility] None   71y.o. male  presents for 3 month follow up with hypertension, hyperlipidemia, prediabetes and vitamin D and medicare wellness visit.    He is on mybetriq and tamsulosin for OAB per Dr. OKarsten Ro He is waking up once a night and sometimes not at all.  He has been having a productive cough of clear mucus for several weeks. He has been having more shortness of breath with minimal exertion.  Has also been noticing an increase in lower leg edema- has not used compression socks   He is on wellbutrin for anxiety/depression which helps.   BMI is Body mass index is 30.03 kg/m., he has not been working on diet and exercise, walks more when warm, will restart in the spring.  Wt Readings from Last 3 Encounters:  05/14/22 194 lb 9.6 oz (88.3 kg)  04/15/22 194 lb 3.2 oz (88.1 kg)  01/30/22 187 lb 12.8 oz (85.2 kg)    His blood pressure has been controlled at home, today their BP is BP: 124/60.   BP Readings from Last 3 Encounters:  05/14/22 124/60  04/15/22 (!) 140/70  01/30/22 108/62   He does not workout. He denies chest pain, dizziness, shortness of breath and headaches  He is a former smoker, 54 pack years, quit in 2005, does have diagnosis of COPD  via CXR in 2013, today he endorses ongoing exertional dyspnea with exercise, denies fatigue, CP or other accompaniments. He currently uses PRN albuterol which does work well for him. He has not tried any daily inhalers.   Had low dose screening chest CT in 11/2018 that showed multiple nodules felt to be benign appearing, unable to get further CT due to insurance, will get yearly  CXR, last one 05/20/21- normal CT also shows aortic atherosclerosis and emphysema.    He is on cholesterol medication, he is taking one tablet, lipitor 3 days a week and denies myalgias. His cholesterol is at goal. The cholesterol last visit was:   Lab Results  Component Value Date   CHOL 155 01/30/2022   HDL 57 01/30/2022   LDLCALC 78 01/30/2022   TRIG 115 01/30/2022   CHOLHDL 2.7 01/30/2022    He has been working on diet and exercise for prediabetes, and denies foot ulcerations, hyperglycemia, hypoglycemia , increased appetite, nausea, paresthesia of the feet, polydipsia, polyuria, visual disturbances, vomiting and weight loss. Last A1C in the office was:  Lab Results  Component Value Date   HGBA1C 5.5 01/30/2022   Patient is on Vitamin D supplement.  Lab Results  Component Value Date   VD25OH 51 01/30/2022    He has labile CKD III GFR values which are monitored closely.  Lab Results  Component Value Date   EGFR 49 (L) 01/30/2022      Current Medications:  Current Outpatient Medications on File Prior to Visit  Medication Sig Dispense Refill   aspirin EC 81 MG tablet Take 81 mg by mouth See admin instructions. Take 1 tablet (81 mg) by mouth 3 times week - Sunday, Wednesday and Friday - with lunch     atorvastatin (LIPITOR) 40 MG tablet TAKE 1 TABLET BY MOUTH  DAILY FOR CHOLESTEROL 90 tablet 3   bisoprolol (ZEBETA) 5 MG tablet TAKE 1 TABLET BY MOUTH  DAILY FOR BLOOD PRESSURE 90 tablet 3   buPROPion (WELLBUTRIN XL) 150 MG 24 hr tablet TAKE 1 TABLET BY MOUTH  DAILY FOR MOOD, FOCUS, AND  CONCENTRATION 90 tablet 3   Cholecalciferol (VITAMIN D-3) 1000 UNITS CAPS Take 5,000 Units by mouth daily.      mirabegron ER (MYRBETRIQ) 50 MG TB24 tablet Take 1 tablet  Daily  for Bladder control 90 tablet 3   tamsulosin (FLOMAX) 0.4 MG CAPS capsule Take 1 tablet at Bedtime for Prostate 90 capsule 3   montelukast (SINGULAIR) 10 MG tablet Take  1 tablet  Daily  for Allergies & Asthma (Patient  not taking: Reported on 05/14/2022) 90 tablet 3   promethazine-dextromethorphan (PROMETHAZINE-DM) 6.25-15 MG/5ML syrup Take 2.5 mLs by mouth 2 (two) times daily as needed for cough. (Patient not taking: Reported on 05/14/2022) 240 mL 0   No current facility-administered medications on file prior to visit.    Medical History:  Past Medical History:  Diagnosis Date   Acute appendicitis 11/24/2016   COPD (chronic obstructive pulmonary disease) (HCC)    GERD (gastroesophageal reflux disease)    Hematuria    HLD (hyperlipidemia)    Hypertension    IBS (irritable bowel syndrome)    Pre-diabetes    Vitamin D deficiency    Preventative care:  Immunization History  Administered Date(s) Administered   Influenza Whole 04/07/2013   Influenza, High Dose Seasonal PF 04/13/2014, 03/27/2015, 03/25/2016, 04/08/2018, 04/14/2019, 05/10/2020, 05/14/2021   Influenza-Unspecified 03/31/2017   PFIZER Comirnaty(Gray Top)Covid-19 Tri-Sucrose Vaccine 11/12/2020   PFIZER(Purple Top)SARS-COV-2 Vaccination 07/15/2019, 08/04/2019,  03/28/2020   PNEUMOCOCCAL CONJUGATE-20 05/14/2021   PPD Test 12/05/2013   Pfizer Covid-19 Vaccine Bivalent Booster 67yr & up 04/05/2021   Pneumococcal Conjugate-13 06/08/2014   Pneumococcal Polysaccharide-23 09/07/2014   Td 08/20/2021   Tdap 04/29/2011   Zoster, Live 06/12/2014    Last colonoscopy: 2017 Dr. SFuller Plan 10 year follow up EGD: 01/2013 DEXA: N/A CXR 09/02/2013 CT chest 11/2019 - nodules - 1 year follow up Ct AB 11/2016   Prior vaccinations: TD or Tdap: 2012 , DUE  will hold to AWV, getting flu and pneumonia today    Influenza: 05/14/21 Pneumococcal 20: 05/14/21 Prevnar13: 2015 Shingles/Zostavax: 2015 Covid 19: 1/2   Names of Other Physician/Practitioners you currently use: 1. [facility]- here for primary care 2. Dr. GKaty Apo eye doctor, last visit 2022, yearly 3. Dr. WJasmine December last visit 12/2020, goes q628mPatient  Care Team: McUnk PintoMD as PCP - General (Internal Medicine) LyKaty ApoMD as Consulting Physician (Ophthalmology) StLadene ArtistMD as Consulting Physician (Gastroenterology) [facility] Pharmacist (Pharmacist)    Allergies No Known Allergies  SURGICAL HISTORY He  has a past surgical history that includes Cervical fusion; Trigger finger release (Right); Carpal tunnel release (Right); Hand surgery (Left); laparoscopic appendectomy (N/A, 11/24/2016); and Cataract extraction, bilateral (Bilateral). FAMILY HISTORY His family history includes COPD in his mother; Cancer in his father; Diverticulosis in his mother; Hypertension in his mother; Irritable bowel syndrome in his mother; Kidney disease in his father; Lung cancer in his father; Lupus in his mother; Other in his father; Stroke in his father. SOCIAL HISTORY He  reports that he quit smoking about 18 years ago. His smoking use included cigarettes. He has a 52.50 pack-year smoking history. He has never used smokeless tobacco. He reports current alcohol use. He reports that he does not use drugs.    Review of Systems:  Review of Systems  Constitutional:  Negative for chills, fever and malaise/fatigue.  HENT:  Negative for congestion, ear pain and sore throat.   Respiratory:  Negative for cough, shortness of breath and wheezing.   Cardiovascular:  Negative for chest pain, palpitations and leg swelling.  Gastrointestinal:  Positive for diarrhea (intermittent). Negative for abdominal pain, blood in stool, constipation, heartburn and melena.  Genitourinary: Negative.  Negative for dysuria.  Musculoskeletal:  Negative for back pain, joint pain and myalgias.  Skin: Negative.   Neurological:  Negative for dizziness, sensory change, loss of consciousness and headaches.  Endo/Heme/Allergies:  Does not bruise/bleed easily.  Psychiatric/Behavioral:  Negative for depression. The patient is not nervous/anxious and does not  have insomnia.     Physical Exam: BP 124/60   Pulse 71   Temp 97.7 F (36.5 C)   Ht 5' 7.5" (1.715 m)   Wt 194 lb 9.6 oz (88.3 kg)   SpO2 96%   BMI 30.03 kg/m  Wt Readings from Last 3 Encounters:  05/14/22 194 lb 9.6 oz (88.3 kg)  04/15/22 194 lb 3.2 oz (88.1 kg)  01/30/22 187 lb 12.8 oz (85.2 kg)    General Appearance: Well nourished well developed, in no apparent distress. Eyes: PERRLA, EOMs, conjunctiva no swelling or erythema ENT/Mouth: Ear canals cerumen impaction bilaterally. TMs normal bilaterally.  Oropharynx moist, clear, without exudate, or postoropharyngeal swelling. Postnasal drip clear noticed Neck: Supple, thyroid normal,no cervical adenopathy  Respiratory: Respiratory effort normal, Breath sounds clear A&P without rhonchi, wheeze, or rale.  No retractions, no accessory usage. Cardio: RRR with no MRGs.  Brisk peripheral pulses with 1-2 + pitting edema Abdomen: Soft, + BS,  Non tender, no guarding, rebound, hernias, masses. Musculoskeletal: Full ROM, 5/5 strength, Normal gait Skin: Warm, dry without rashes, lesions, ecchymosis.  Neuro: Awake and oriented X 3, Cranial nerves intact. Normal muscle tone, no cerebellar symptoms. Psych: Normal affect, Insight and Judgment appropriate.   EKG: NSR, no ST changes AAA: < 3 cm  [facility]  05/14/2022

## 2022-05-14 ENCOUNTER — Ambulatory Visit (INDEPENDENT_AMBULATORY_CARE_PROVIDER_SITE_OTHER): Payer: Medicare Other | Admitting: Nurse Practitioner

## 2022-05-14 ENCOUNTER — Encounter: Payer: Self-pay | Admitting: Nurse Practitioner

## 2022-05-14 VITALS — BP 124/60 | HR 71 | Temp 97.7°F | Ht 67.5 in | Wt 194.6 lb

## 2022-05-14 DIAGNOSIS — Z79899 Other long term (current) drug therapy: Secondary | ICD-10-CM

## 2022-05-14 DIAGNOSIS — Z23 Encounter for immunization: Secondary | ICD-10-CM

## 2022-05-14 DIAGNOSIS — R0602 Shortness of breath: Secondary | ICD-10-CM

## 2022-05-14 DIAGNOSIS — R7309 Other abnormal glucose: Secondary | ICD-10-CM

## 2022-05-14 DIAGNOSIS — F411 Generalized anxiety disorder: Secondary | ICD-10-CM

## 2022-05-14 DIAGNOSIS — Z136 Encounter for screening for cardiovascular disorders: Secondary | ICD-10-CM | POA: Diagnosis not present

## 2022-05-14 DIAGNOSIS — J4489 Other specified chronic obstructive pulmonary disease: Secondary | ICD-10-CM

## 2022-05-14 DIAGNOSIS — Z0000 Encounter for general adult medical examination without abnormal findings: Secondary | ICD-10-CM | POA: Diagnosis not present

## 2022-05-14 DIAGNOSIS — Z1389 Encounter for screening for other disorder: Secondary | ICD-10-CM

## 2022-05-14 DIAGNOSIS — E559 Vitamin D deficiency, unspecified: Secondary | ICD-10-CM

## 2022-05-14 DIAGNOSIS — I700 Atherosclerosis of aorta: Secondary | ICD-10-CM

## 2022-05-14 DIAGNOSIS — I10 Essential (primary) hypertension: Secondary | ICD-10-CM

## 2022-05-14 DIAGNOSIS — J3489 Other specified disorders of nose and nasal sinuses: Secondary | ICD-10-CM

## 2022-05-14 DIAGNOSIS — E782 Mixed hyperlipidemia: Secondary | ICD-10-CM

## 2022-05-14 DIAGNOSIS — N401 Enlarged prostate with lower urinary tract symptoms: Secondary | ICD-10-CM

## 2022-05-14 DIAGNOSIS — R609 Edema, unspecified: Secondary | ICD-10-CM

## 2022-05-14 DIAGNOSIS — N183 Chronic kidney disease, stage 3 unspecified: Secondary | ICD-10-CM | POA: Diagnosis not present

## 2022-05-14 DIAGNOSIS — E663 Overweight: Secondary | ICD-10-CM

## 2022-05-14 DIAGNOSIS — Z1329 Encounter for screening for other suspected endocrine disorder: Secondary | ICD-10-CM

## 2022-05-14 DIAGNOSIS — Z125 Encounter for screening for malignant neoplasm of prostate: Secondary | ICD-10-CM

## 2022-05-14 DIAGNOSIS — Z0001 Encounter for general adult medical examination with abnormal findings: Secondary | ICD-10-CM

## 2022-05-14 DIAGNOSIS — Z87891 Personal history of nicotine dependence: Secondary | ICD-10-CM

## 2022-05-14 MED ORDER — AZELASTINE HCL 0.1 % NA SOLN: 2.0000 | Freq: Two times a day (BID) | NASAL | 2 refills | Status: DC

## 2022-05-14 NOTE — Patient Instructions (Signed)
Chest xray is ordered to evaluate lungs due to smoking history [employer] [address] [employer] In M-F 8:30-3:45    GENERAL HEALTH GOALS    Know what a healthy weight is for you (roughly BMI <25) and aim to maintain this   Aim for 7+ servings of fruits and vegetables daily   70-80+ fluid ounces of water or unsweet tea for healthy kidneys   Limit to max 1 drink of alcohol per day; avoid smoking/tobacco   Limit animal fats in diet for cholesterol and heart health - choose grass fed whenever available   Avoid highly processed foods, and foods high in saturated/trans fats   Aim for low stress - take time to unwind and care for your mental health   Aim for 150 min of moderate intensity exercise weekly for heart health, and weights twice weekly for bone health   Aim for 7-9 hours of sleep daily

## 2022-05-15 LAB — TSH: TSH: 1.09 mIU/L (ref 0.40–4.50)

## 2022-05-15 LAB — CBC WITH DIFFERENTIAL/PLATELET
Absolute Monocytes: 482 cells/uL (ref 200–950)
Basophils Absolute: 42 cells/uL (ref 0–200)
Basophils Relative: 0.8 %
Eosinophils Absolute: 270 cells/uL (ref 15–500)
Eosinophils Relative: 5.1 %
HCT: 35.8 % — ABNORMAL LOW (ref 38.5–50.0)
Hemoglobin: 12.1 g/dL — ABNORMAL LOW (ref 13.2–17.1)
Lymphs Abs: 1362 cells/uL (ref 850–3900)
MCH: 31 pg (ref 27.0–33.0)
MCHC: 33.8 g/dL (ref 32.0–36.0)
MCV: 91.8 fL (ref 80.0–100.0)
MPV: 11 fL (ref 7.5–12.5)
Monocytes Relative: 9.1 %
Neutro Abs: 3143 cells/uL (ref 1500–7800)
Neutrophils Relative %: 59.3 %
Platelets: 249 10*3/uL (ref 140–400)
RBC: 3.9 10*6/uL — ABNORMAL LOW (ref 4.20–5.80)
RDW: 12.3 % (ref 11.0–15.0)
Total Lymphocyte: 25.7 %
WBC: 5.3 10*3/uL (ref 3.8–10.8)

## 2022-05-15 LAB — COMPLETE METABOLIC PANEL WITH GFR
AG Ratio: 1.6 (calc) (ref 1.0–2.5)
ALT: 20 U/L (ref 9–46)
AST: 28 U/L (ref 10–35)
Albumin: 4.2 g/dL (ref 3.6–5.1)
Alkaline phosphatase (APISO): 78 U/L (ref 35–144)
BUN/Creatinine Ratio: 13 (calc) (ref 6–22)
BUN: 19 mg/dL (ref 7–25)
CO2: 28 mmol/L (ref 20–32)
Calcium: 9.9 mg/dL (ref 8.6–10.3)
Chloride: 103 mmol/L (ref 98–110)
Creat: 1.49 mg/dL — ABNORMAL HIGH (ref 0.70–1.28)
Globulin: 2.7 g/dL (calc) (ref 1.9–3.7)
Glucose, Bld: 100 mg/dL — ABNORMAL HIGH (ref 65–99)
Potassium: 5 mmol/L (ref 3.5–5.3)
Sodium: 140 mmol/L (ref 135–146)
Total Bilirubin: 0.7 mg/dL (ref 0.2–1.2)
Total Protein: 6.9 g/dL (ref 6.1–8.1)
eGFR: 49 mL/min/{1.73_m2} — ABNORMAL LOW (ref >=60)

## 2022-05-15 LAB — URINALYSIS, ROUTINE W REFLEX MICROSCOPIC
Bilirubin Urine: NEGATIVE
Glucose, UA: NEGATIVE
Hgb urine dipstick: NEGATIVE
Ketones, ur: NEGATIVE
Leukocytes,Ua: NEGATIVE
Nitrite: NEGATIVE
Protein, ur: NEGATIVE
Specific Gravity, Urine: 1.006 (ref 1.001–1.035)
pH: 6.5 (ref 5.0–8.0)

## 2022-05-15 LAB — LIPID PANEL
Cholesterol: 152 mg/dL (ref <200)
HDL: 61 mg/dL (ref >=40)
LDL Cholesterol (Calc): 72 mg/dL (calc)
Non-HDL Cholesterol (Calc): 91 mg/dL (calc) (ref <130)
Total CHOL/HDL Ratio: 2.5 (calc) (ref <5.0)
Triglycerides: 102 mg/dL (ref <150)

## 2022-05-15 LAB — MICROALBUMIN / CREATININE URINE RATIO
Creatinine, Urine: 46 mg/dL (ref 20–320)
Microalb Creat Ratio: 24 mcg/mg creat (ref <30)
Microalb, Ur: 1.1 mg/dL

## 2022-05-15 LAB — MAGNESIUM: Magnesium: 1.9 mg/dL (ref 1.5–2.5)

## 2022-05-15 LAB — VITAMIN D 25 HYDROXY (VIT D DEFICIENCY, FRACTURES): Vit D, 25-Hydroxy: 70 ng/mL (ref 30–100)

## 2022-05-15 LAB — HEMOGLOBIN A1C
Hgb A1c MFr Bld: 5.6 % of total Hgb (ref <5.7)
Mean Plasma Glucose: 114 mg/dL
eAG (mmol/L): 6.3 mmol/L

## 2022-05-15 LAB — PSA: PSA: 0.78 ng/mL (ref <=4.00)

## 2022-05-23 ENCOUNTER — Ambulatory Visit: Payer: Medicare Other | Attending: Cardiovascular Disease | Admitting: Cardiovascular Disease

## 2022-05-23 ENCOUNTER — Encounter: Payer: Self-pay | Admitting: Cardiovascular Disease

## 2022-05-23 VITALS — BP 130/64 | HR 57 | Ht 69.0 in | Wt 192.8 lb

## 2022-05-23 DIAGNOSIS — R0602 Shortness of breath: Secondary | ICD-10-CM

## 2022-05-23 DIAGNOSIS — E7800 Pure hypercholesterolemia, unspecified: Secondary | ICD-10-CM

## 2022-05-23 DIAGNOSIS — I739 Peripheral vascular disease, unspecified: Secondary | ICD-10-CM | POA: Diagnosis not present

## 2022-05-23 DIAGNOSIS — I700 Atherosclerosis of aorta: Secondary | ICD-10-CM

## 2022-05-23 DIAGNOSIS — N1831 Chronic kidney disease, stage 3a: Secondary | ICD-10-CM | POA: Diagnosis not present

## 2022-05-23 DIAGNOSIS — I10 Essential (primary) hypertension: Secondary | ICD-10-CM | POA: Diagnosis not present

## 2022-05-23 DIAGNOSIS — J449 Chronic obstructive pulmonary disease, unspecified: Secondary | ICD-10-CM

## 2022-05-23 DIAGNOSIS — I2510 Atherosclerotic heart disease of native coronary artery without angina pectoris: Secondary | ICD-10-CM | POA: Diagnosis not present

## 2022-05-23 DIAGNOSIS — I2584 Coronary atherosclerosis due to calcified coronary lesion: Secondary | ICD-10-CM | POA: Diagnosis not present

## 2022-05-23 MED ORDER — FUROSEMIDE 20 MG PO TABS: 20.0000 mg | ORAL_TABLET | Freq: Every day | ORAL | 0 refills | Status: DC

## 2022-05-23 NOTE — Progress Notes (Signed)
Cardiology Office Note:    Date:  05/23/2022   ID:  [name] Park, DOB [birth year], MRN [id number]  PCP:  Unk Pinto, [facility] Providers Cardiologist:  Sanda Klein, MD     Referring MD: Alycia Rossetti, NP   Chief Complaint  Patient presents with   Consult  [name] Park is a 71 y.o. male who is being seen today for the evaluation of exertional dyspnea and lower extremity edema at the request of Alycia Rossetti, NP.   History of Present Illness:    [name] Park is a 71 y.o. male with a hx of hypertension, prediabetes, hypercholesterolemia, COPD, CKD stage III, history of right carpal tunnel release presents with complaints of dyspnea and edema.  He has had swelling in his ankles for at least the last couple of years.  His blood pressure had been treated with a diuretic until he developed a norovirus infection about 2 years ago, he was severely hypotensive and the medication was stopped.  He has gradually developed worsening exertional dyspnea.  He now gets short of breath climbing a flight of steps.  He has significant shortness of breath if he bends over.  He has dry cough and wheezing.  He does not have orthopnea or PND.  Denies palpitations, dizziness or syncope.  Reports that his smart watch often shows his heart rate in the 90-100 bpm range which for him is otherwise quite unusual.  He is used to having a heart rate in the 60s.  He also describes what sounds to be like intermittent claudication.  He develops tightness in both calves, much worse on the right side after walking roughly 1 block.  The symptoms resolved with rest.  He has had pneumonia "many times".  He has a history of smoking 1 to 2 packs a day for about 30 years, but quit more than 10 years ago.  He does not use inhalers.  His Problems with frequent urination and takes both Myrbetriq and Flomax.  He is on treatment with atorvastatin and recent LDL cholesterol was 72.  He bears a  diagnosis of prediabetes but his most recent hemoglobin A1c was normal at 5.6%.  His creatinine has been stable at around 1.5 on multiple assays performed this year, most recently a week ago.  He does not have a family history of early onset CAD.  His grandmother did die of congestive heart failure and advanced age.  Atherosclerotic calcification is noticed in the thoracic aorta on his chest x-ray.  Atherosclerotic calcifications are also seen in the aorta on a CT of the abdomen and pelvis performed in 2021 and atherosclerotic calcification is seen in the LAD artery on a chest CT performed for lung cancer screening in 2020.  His wife [name] is also my patient.  Past Medical History:  Diagnosis Date   Acute appendicitis 11/24/2016   COPD (chronic obstructive pulmonary disease) (HCC)    GERD (gastroesophageal reflux disease)    Hematuria    HLD (hyperlipidemia)    Hypertension    IBS (irritable bowel syndrome)    Pre-diabetes    Vitamin D deficiency     Past Surgical History:  Procedure Laterality Date   CARPAL TUNNEL RELEASE Right    CATARACT EXTRACTION, BILATERAL Bilateral    Dr. Prudencio Burly   CERVICAL FUSION     HAND SURGERY Left    LAPAROSCOPIC APPENDECTOMY N/A 11/24/2016   Procedure: APPENDECTOMY LAPAROSCOPIC;  Surgeon: Clovis Riley, MD;  Location:  [facility] OR;  Service: General;  Laterality: N/A;   TRIGGER FINGER RELEASE Right    thumb    Current Medications: Current Meds  Medication Sig   aspirin EC 81 MG tablet Take 81 mg by mouth See admin instructions. Take 1 tablet (81 mg) by mouth 3 times week - Sunday, Wednesday and Friday - with lunch   atorvastatin (LIPITOR) 40 MG tablet TAKE 1 TABLET BY MOUTH  DAILY FOR CHOLESTEROL   azelastine (ASTELIN) 0.1 % nasal spray Place 2 sprays into both nostrils 2 (two) times daily. Use in each nostril as directed   bisoprolol (ZEBETA) 5 MG tablet TAKE 1 TABLET BY MOUTH  DAILY FOR BLOOD PRESSURE   buPROPion (WELLBUTRIN XL) 150 MG 24 hr tablet TAKE 1  TABLET BY MOUTH  DAILY FOR MOOD, FOCUS, AND  CONCENTRATION   Cholecalciferol (VITAMIN D-3) 1000 UNITS CAPS Take 5,000 Units by mouth daily.    furosemide (LASIX) 20 MG tablet Take 1 tablet (20 mg total) by mouth daily.   mirabegron ER (MYRBETRIQ) 50 MG TB24 tablet Take 1 tablet  Daily  for Bladder control   tamsulosin (FLOMAX) 0.4 MG CAPS capsule Take 1 tablet at Bedtime for Prostate     Allergies:   Patient has no known allergies.   Social History   Socioeconomic History   Marital status: Married    Spouse name: Not on file   Number of children: 1   Years of education: Not on file   Highest education level: Not on file  Occupational History   Occupation: retired  Tobacco Use   Smoking status: Former    Packs/day: 1.50    Years: 35.00    Total pack years: 52.50    Types: Cigarettes    Quit date: 02/05/2004    Years since quitting: 18.3   Smokeless tobacco: Never  Vaping Use   Vaping Use: Never used  Substance and Sexual Activity   Alcohol use: Yes    Comment: seldom   Drug use: No   Sexual activity: Not on file  Other Topics Concern   Not on file  Social History Narrative   Not on file   Social Determinants of Health   Financial Resource Strain: Not on file  Food Insecurity: Not on file  Transportation Needs: Not on file  Physical Activity: Not on file  Stress: Not on file  Social Connections: Not on file     Family History: The patient's family history includes COPD in his mother; Cancer in his father; Diverticulosis in his mother; Hypertension in his mother; Irritable bowel syndrome in his mother; Kidney disease in his father; Lung cancer in his father; Lupus in his mother; Other in his father; Stroke in his father. There is no history of Colon cancer, Esophageal cancer, Rectal cancer, or Stomach cancer.  ROS:   Please see the history of present illness.     All other systems reviewed and are negative.  EKGs/Labs/Other Studies Reviewed:    The following  studies were reviewed today:   EKG:  EKG is  ordered today.  The ekg ordered today demonstrates sinus bradycardia but is otherwise a normal tracing.  Normal QTc 412 ms.  Recent Labs: 05/14/2022: ALT 20; BUN 19; Creat 1.49; Hemoglobin 12.1; Magnesium 1.9; Platelets 249; Potassium 5.0; Sodium 140; TSH 1.09  Recent Lipid Panel    Component Value Date/Time   CHOL 152 05/14/2022 0000   TRIG 102 05/14/2022 0000   HDL 61 05/14/2022 0000   CHOLHDL 2.5  05/14/2022 0000   VLDL 24 08/11/2016 1129   LDLCALC 72 05/14/2022 0000     Risk Assessment/Calculations:                Physical Exam:    VS:  BP 130/64 (BP Location: Left Arm, Patient Position: Sitting, Cuff Size: Normal)   Pulse (!) 57   Ht '5\' 9"'$  (1.753 m)   Wt 87.5 kg   SpO2 97%   BMI 28.47 kg/m     Wt Readings from Last 3 Encounters:  05/23/22 87.5 kg  05/14/22 88.3 kg  04/15/22 88.1 kg     GEN: Overweight well nourished, well developed in no acute distress HEENT: Normal NECK: No JVD; No carotid bruits LYMPHATICS: No lymphadenopathy CARDIAC: RRR, no murmurs, rubs, gallops.  Unable to palpate pedal pulses but he has quite a bit of edema that makes it difficult. RESPIRATORY:  Clear to auscultation without rales, wheezing or rhonchi  ABDOMEN: Soft, non-tender, non-distended MUSCULOSKELETAL: 2+ symmetrical heart pitting edema in the ankles and halfway up the shins bilaterally; No deformity  SKIN: Warm and dry NEUROLOGIC:  Alert and oriented x 3 PSYCHIATRIC:  Normal affect   ASSESSMENT:    1. SOB (shortness of breath)   2. Aortic atherosclerosis ([name])   3. Coronary artery calcification   4. Hypercholesterolemia   5. Essential hypertension   6. Claudication ([name] [place])   7. Chronic obstructive pulmonary disease, unspecified COPD type ([name])   8. Stage 3a chronic kidney disease (HCC)    PLAN:    In order of problems listed above:  Dyspnea on exertion: I am concerned that he is describing symptoms of CHF, NYHA  functional class II-3 (exertional dyspnea, bendopnea).  He does have evidence of lower extremity edema, but otherwise no findings to suggest volume overload at this time.  Recommend echocardiogram.  His shortness of breath could also be due from COPD and his edema could be secondary to right heart failure from cor pulmonale.  An echo would go a long way to clarify the situation.  Will start a very low-dose of furosemide 20 mg once daily. Coronary/aortic atherosclerosis: Documented on multiple imaging studies.  He does not have angina pectoris either at rest or with activity.  Depending on the findings on his echocardiogram would might proceed with a coronary CT angiogram (if LVEF and regional wall motion is normal) or with invasive coronary angiography) if he has clear-cut regional wall motion abnormalities and/or depressed LVEF).  He is taking aspirin, statin, beta-blocker. HLP: On current treatment with statin his LDL is very close to target of 70 or less. HTN: Well-controlled with current medications which include highly beta-1 selective beta-blocker. Intermittent claudication: Scheduled for lower extremity arterial Dopplers. COPD: This could explain his frequent episodes of pneumonia, shortness of breath with activity, lower extremity edema via right heart failure.  He has a daily cough and occasional wheezing.  I do not think he's ever had formal pulmonary function testing.  If his cardiac workup is benign, would proceed with PFTs and pulmonary referral.  He quit smoking many years ago. CKD3a: Creatinine has been very stable around 1.5 on labs performed several times this year.  GFR is probably 45-50.           Medication Adjustments/Labs and Tests Ordered: Current medicines are reviewed at length with the patient today.  Concerns regarding medicines are outlined above.  Orders Placed This Encounter  Procedures   EKG 12-Lead   ECHOCARDIOGRAM COMPLETE   VAS [place] LOWER  EXTREMITY ARTERIAL DUPLEX    Meds ordered this encounter  Medications   furosemide (LASIX) 20 MG tablet    Sig: Take 1 tablet (20 mg total) by mouth daily.    Dispense:  30 tablet    Refill:  0    Patient Instructions  Medication Instructions:  START Furosemide (Lasix) 20 mg daily   *If you need a refill on your cardiac medications before your next appointment, please call your pharmacy*  Lab Work: NONE ordered at this time of appointment   If you have labs (blood work) drawn today and your tests are completely normal, you will receive your results only by: [place] (if you have MyChart) OR A paper copy in the mail If you have any lab test that is abnormal or we need to change your treatment, we will call you to review the results.  Testing/Procedures: Your physician has requested that you have an echocardiogram. Echocardiography is a painless test that uses sound waves to create images of your heart. It provides your doctor with information about the size and shape of your heart and how well your heart's chambers and valves are working. This procedure takes approximately one hour. There are no restrictions for this procedure. Please do NOT wear cologne, perfume, aftershave, or lotions (deodorant is allowed). Please arrive 15 minutes prior to your appointment time.  Your physician has requested that you have a lower extremity arterial exercise duplex. During this test, exercise and ultrasound are used to evaluate arterial blood flow in the legs. Allow one hour for this exam. There are no restrictions or special instructions.  Follow-Up: At [facility], you and your health needs are our priority.  As part of our continuing mission to provide you with exceptional heart care, we have created designated Provider Care Teams.  These Care Teams include your primary Cardiologist (physician) and Advanced Practice Providers (APPs -  Physician Assistants and Nurse Practitioners) who all work together to  provide you with the care you need, when you need it.   Your next appointment:   After Echocardiogram    The format for your next appointment:   In Person  Provider:   Sanda Klein, MD     Other Instructions  Important Information About Sugar         Signed, Sanda Klein, MD  05/23/2022 7:51 PM    [name] Park

## 2022-05-23 NOTE — Patient Instructions (Addendum)
Medication Instructions:  START Furosemide (Lasix) 20 mg daily   *If you need a refill on your cardiac medications before your next appointment, please call your pharmacy*  Lab Work: NONE ordered at this time of appointment   If you have labs (blood work) drawn today and your tests are completely normal, you will receive your results only by: [place] (if you have MyChart) OR A paper copy in the mail If you have any lab test that is abnormal or we need to change your treatment, we will call you to review the results.  Testing/Procedures: Your physician has requested that you have an echocardiogram. Echocardiography is a painless test that uses sound waves to create images of your heart. It provides your doctor with information about the size and shape of your heart and how well your heart's chambers and valves are working. This procedure takes approximately one hour. There are no restrictions for this procedure. Please do NOT wear cologne, perfume, aftershave, or lotions (deodorant is allowed). Please arrive 15 minutes prior to your appointment time.  Your physician has requested that you have a lower extremity arterial exercise duplex. During this test, exercise and ultrasound are used to evaluate arterial blood flow in the legs. Allow one hour for this exam. There are no restrictions or special instructions.  Follow-Up: At [facility], you and your health needs are our priority.  As part of our continuing mission to provide you with exceptional heart care, we have created designated Provider Care Teams.  These Care Teams include your primary Cardiologist (physician) and Advanced Practice Providers (APPs -  Physician Assistants and Nurse Practitioners) who all work together to provide you with the care you need, when you need it.   Your next appointment:   After Echocardiogram    The format for your next appointment:   In Person  Provider:   Sanda Klein, MD      Other Instructions  Important Information About Sugar

## 2022-05-27 ENCOUNTER — Other Ambulatory Visit: Payer: Self-pay | Admitting: Cardiovascular Disease

## 2022-05-27 DIAGNOSIS — I739 Peripheral vascular disease, unspecified: Secondary | ICD-10-CM

## 2022-06-03 ENCOUNTER — Ambulatory Visit (HOSPITAL_COMMUNITY)
Admission: RE | Admit: 2022-06-03 | Discharge: 2022-06-03 | Disposition: A | Discharge status: Home / Self Care | Payer: Medicare Other | Source: Ambulatory Visit | Attending: Internal Medicine | Admitting: Internal Medicine

## 2022-06-03 DIAGNOSIS — I739 Peripheral vascular disease, unspecified: Secondary | ICD-10-CM | POA: Diagnosis not present

## 2022-06-06 ENCOUNTER — Encounter: Payer: Self-pay | Admitting: Internal Medicine

## 2022-06-13 ENCOUNTER — Ambulatory Visit (HOSPITAL_COMMUNITY): Payer: Medicare Other | Attending: Cardiovascular Disease

## 2022-06-13 DIAGNOSIS — R0602 Shortness of breath: Secondary | ICD-10-CM | POA: Insufficient documentation

## 2022-06-13 LAB — ECHOCARDIOGRAM COMPLETE
Area-P 1/2: 3.6 cm2
S' Lateral: 2.9 cm

## 2022-06-18 ENCOUNTER — Other Ambulatory Visit: Payer: Self-pay

## 2022-06-18 DIAGNOSIS — R0602 Shortness of breath: Secondary | ICD-10-CM

## 2022-06-19 ENCOUNTER — Ambulatory Visit (INDEPENDENT_AMBULATORY_CARE_PROVIDER_SITE_OTHER): Payer: Medicare Other | Admitting: Nurse Practitioner

## 2022-06-19 ENCOUNTER — Other Ambulatory Visit: Payer: Self-pay

## 2022-06-19 ENCOUNTER — Encounter: Payer: Self-pay | Admitting: Nurse Practitioner

## 2022-06-19 VITALS — BP 140/68 | HR 73 | Temp 97.3°F | Wt 191.6 lb

## 2022-06-19 DIAGNOSIS — R051 Acute cough: Secondary | ICD-10-CM | POA: Diagnosis not present

## 2022-06-19 DIAGNOSIS — R0789 Other chest pain: Secondary | ICD-10-CM

## 2022-06-19 DIAGNOSIS — Z1152 Encounter for screening for COVID-19: Secondary | ICD-10-CM

## 2022-06-19 DIAGNOSIS — R6889 Other general symptoms and signs: Secondary | ICD-10-CM | POA: Diagnosis not present

## 2022-06-19 DIAGNOSIS — R062 Wheezing: Secondary | ICD-10-CM

## 2022-06-19 LAB — POC COVID19 BINAXNOW: SARS Coronavirus 2 Ag: NEGATIVE

## 2022-06-19 LAB — POCT INFLUENZA A/B
Influenza A, POC: NEGATIVE
Influenza B, POC: NEGATIVE

## 2022-06-19 MED ORDER — PREDNISONE 10 MG PO TABS: ORAL_TABLET | ORAL | 0 refills | Status: DC

## 2022-06-19 MED ORDER — IPRATROPIUM-ALBUTEROL 0.5-2.5 (3) MG/3ML IN SOLN: 3.0000 mL | Freq: Once | RESPIRATORY_TRACT | Status: DC

## 2022-06-19 NOTE — Progress Notes (Signed)
Assessment and Plan:  MANG HAZELRIGG was seen today for an episodic visit.  Diagnoses and all order for this visit:  1. Encounter for screening for COVID-19 Negative  - POC COVID-19  2. Flu-like symptoms Negative  - POCT Influenza A/B  3. Acute cough Stay well hydrated Continue Promethazine cough syrup on hand  4. Wheezing Duoneb administered.  Patient tolerated well. Call 911 for any increase in difficulty breathing.  - predniSONE (DELTASONE) 10 MG tablet; 1 tab 3 x day for 2 days, then 1 tab 2 x day for 2 days, then 1 tab 1 x day for 3 days  Dispense: 13 tablet; Refill: 0 - ipratropium-albuterol (DUONEB) 0.5-2.5 (3) MG/3ML nebulizer solution 3 mL  5. Chest tightness Duoneb administered.  Patient tolerated well. Call 911 for any increase in difficulty breathing.  - predniSONE (DELTASONE) 10 MG tablet; 1 tab 3 x day for 2 days, then 1 tab 2 x day for 2 days, then 1 tab 1 x day for 3 days  Dispense: 13 tablet; Refill: 0 - ipratropium-albuterol (DUONEB) 0.5-2.5 (3) MG/3ML nebulizer solution 3 mL  Notify office for further evaluation and treatment, questions or concerns if s/s fail to improve. The risks and benefits of my recommendations, as well as other treatment options were discussed with the patient today. Questions were answered.  Further disposition pending results of labs. Discussed med's effects and SE's.    Over 20 minutes of exam, counseling, chart review, and critical decision making was performed.   Future Appointments  Date Time Provider [place]  06/25/2022 11:30 AM Croitoru, Dani Gobble, MD [facility] None  09/03/2022 11:00 AM Alycia Rossetti, NP [facility] None  05/15/2023 10:00 AM Alycia Rossetti, NP [facility] None    ------------------------------------------------------------------------------------------------------------------   HPI BP (!) 140/68   Pulse 73   Temp (!) 97.3 F (36.3 C)   Wt 191 lb 9.6 oz (86.9 kg)   SpO2 93%   BMI  28.29 kg/m   Patient complains of symptoms of a URI. Symptoms include congestion, cough described as productive, nasal congestion, and wheezing most notably at night, along with chest tightness. Onset of symptoms was 3 days ago, and has been unchanged since that time. Treatment to date: decongestants Mucinex and Promethazine cough syrup.  He has tried Dexamethasone in the past and was not able to tolerate causing insomnia.    Past Medical History:  Diagnosis Date   Acute appendicitis 11/24/2016   COPD (chronic obstructive pulmonary disease) (HCC)    GERD (gastroesophageal reflux disease)    Hematuria    HLD (hyperlipidemia)    Hypertension    IBS (irritable bowel syndrome)    Pre-diabetes    Vitamin D deficiency      No Known Allergies  Current Outpatient Medications on File Prior to Visit  Medication Sig   aspirin EC 81 MG tablet Take 81 mg by mouth See admin instructions. Take 1 tablet (81 mg) by mouth 3 times week - Sunday, Wednesday and Friday - with lunch   atorvastatin (LIPITOR) 40 MG tablet TAKE 1 TABLET BY MOUTH  DAILY FOR CHOLESTEROL   azelastine (ASTELIN) 0.1 % nasal spray Place 2 sprays into both nostrils 2 (two) times daily. Use in each nostril as directed   bisoprolol (ZEBETA) 5 MG tablet TAKE 1 TABLET BY MOUTH  DAILY FOR BLOOD PRESSURE   buPROPion (WELLBUTRIN XL) 150 MG 24 hr tablet TAKE 1 TABLET BY MOUTH  DAILY FOR MOOD, FOCUS, AND  CONCENTRATION   Cholecalciferol (VITAMIN  D-3) 1000 UNITS CAPS Take 5,000 Units by mouth daily.    furosemide (LASIX) 20 MG tablet Take 1 tablet (20 mg total) by mouth daily.   mirabegron ER (MYRBETRIQ) 50 MG TB24 tablet Take 1 tablet  Daily  for Bladder control   tamsulosin (FLOMAX) 0.4 MG CAPS capsule Take 1 tablet at Bedtime for Prostate   No current facility-administered medications on file prior to visit.    ROS: all negative except what is noted in the HPI.   Physical Exam:  BP (!) 140/68   Pulse 73   Temp (!) 97.3 F (36.3 C)    Wt 191 lb 9.6 oz (86.9 kg)   SpO2 93%   BMI 28.29 kg/m   General Appearance: NAD.  Awake, conversant and cooperative. Eyes: PERRLA, EOMs intact.  Sclera white.  Conjunctiva without erythema. Sinuses: No frontal/maxillary tenderness.  No nasal discharge. Nares patent.  ENT/Mouth: Ext aud canals clear.  Bilateral TMs w/DOL and without erythema or bulging. Hearing intact.  Posterior pharynx without swelling or exudate.  Tonsils without swelling or erythema.  Neck: Supple.  No masses, nodules or thyromegaly. Respiratory: Effort is regular with non-labored breathing. Breath sounds are equal bilaterally without rales, rhonchi, wheezing or stridor.  Cardio: RRR with no MRGs. Brisk peripheral pulses without edema.  Abdomen: Active BS in all four quadrants.  Soft and non-tender without guarding, rebound tenderness, hernias or masses. Lymphatics: Non tender without lymphadenopathy.  Musculoskeletal: Full ROM, 5/5 strength, normal ambulation.  No clubbing or cyanosis. Skin: Appropriate color for ethnicity. Warm without rashes, lesions, ecchymosis, ulcers.  Neuro: CN II-XII grossly normal. Normal muscle tone without cerebellar symptoms and intact sensation.   Psych: AO X 3,  appropriate mood and affect, insight and judgment.     Darrol Jump, NP 2:09 PM [facility]

## 2022-06-19 NOTE — Patient Instructions (Signed)

## 2022-06-20 ENCOUNTER — Other Ambulatory Visit: Payer: Self-pay | Admitting: Cardiovascular Disease

## 2022-06-25 ENCOUNTER — Encounter: Payer: Self-pay | Admitting: Cardiovascular Disease

## 2022-06-25 ENCOUNTER — Ambulatory Visit: Payer: Medicare Other | Attending: Cardiovascular Disease | Admitting: Cardiovascular Disease

## 2022-06-25 VITALS — BP 134/66 | HR 60 | Ht 69.0 in | Wt 191.4 lb

## 2022-06-25 DIAGNOSIS — I2510 Atherosclerotic heart disease of native coronary artery without angina pectoris: Secondary | ICD-10-CM

## 2022-06-25 DIAGNOSIS — J4489 Other specified chronic obstructive pulmonary disease: Secondary | ICD-10-CM

## 2022-06-25 DIAGNOSIS — I10 Essential (primary) hypertension: Secondary | ICD-10-CM

## 2022-06-25 DIAGNOSIS — E7800 Pure hypercholesterolemia, unspecified: Secondary | ICD-10-CM

## 2022-06-25 DIAGNOSIS — I700 Atherosclerosis of aorta: Secondary | ICD-10-CM

## 2022-06-25 DIAGNOSIS — R0602 Shortness of breath: Secondary | ICD-10-CM | POA: Diagnosis not present

## 2022-06-25 DIAGNOSIS — N1831 Chronic kidney disease, stage 3a: Secondary | ICD-10-CM

## 2022-06-25 NOTE — Progress Notes (Signed)
Cardiology Office Note:    Date:  06/27/2022   ID:  [name] Park, DOB Mar 15, 1951, MRN [id number]  PCP:  Unk Pinto, MD   [place] Prov[facility] Cardiologist:  Sanda Klein, MD     Referring MD: Unk Pinto, MD   Chief Complaint  Patient presents with   Shortness of Breath         History of Present Illness:    [name] Park is a 72 y.o. male with a hx of hypertension, prediabetes, hypercholesterolemia, COPD, CKD stage III, history of right carpal tunnel release presents with complaints of dyspnea and edema.  He returns in follow-up after undergoing an echocardiogram and labs.  Left ventricular systolic and diastolic function parameters are normal and estimated pulmonary artery pressure was also normal.  There were no significant valvular abnormalities.  The inferior vena cava was not dilated.  Albeit checked after we gave him furosemide, the BNP was remarkably low at 32.  After taking several days of diuretics his creatinine is higher than baseline (1.75 versus 1.5-1.6).  Despite this, he does believe that his shortness of breath has improved since he started taking furosemide 20 mg once daily.  He has also been receiving treatment with prednisone and inhalers.   He had some complaints that sound like intermittent claudication with pain in his calves worse on the right than the left after walking more than a block, but his lower extremity arterial Dopplers were completely normal and bilateral ABIs were normal.  He has had pneumonia "many times".  He has a history of smoking 1 to 2 packs a day for about 30 years, but quit more than 10 years ago.  He does not use inhalers.  His Problems with frequent urination and takes both Myrbetriq and Flomax.  He is on treatment with atorvastatin and recent LDL cholesterol was 72.  He bears a diagnosis of prediabetes but his most recent hemoglobin A1c was normal at 5.6%.  His creatinine has been stable at around 1.5 on  multiple assays performed this year, most recently a week ago.  He does not have a family history of early onset CAD.  His grandmother did die of congestive heart failure and advanced age.  Atherosclerotic calcification is noticed in the thoracic aorta on his chest x-ray.  Atherosclerotic calcifications are also seen in the aorta on a CT of the abdomen and pelvis performed in 2021 and atherosclerotic calcification is seen in the LAD artery on a chest CT performed for lung cancer screening in 2020.  His wife [name] Park is also my patient.  Past Medical History:  Diagnosis Date   Acute appendicitis 11/24/2016   COPD (chronic obstructive pulmonary disease) (HCC)    GERD (gastroesophageal reflux disease)    Hematuria    HLD (hyperlipidemia)    Hypertension    IBS (irritable bowel syndrome)    Pre-diabetes    Vitamin D deficiency     Past Surgical History:  Procedure Laterality Date   CARPAL TUNNEL RELEASE Right    CATARACT EXTRACTION, BILATERAL Bilateral    Dr. Prudencio Burly   CERVICAL FUSION     HAND SURGERY Left    LAPAROSCOPIC APPENDECTOMY N/A 11/24/2016   Procedure: APPENDECTOMY LAPAROSCOPIC;  Surgeon: Clovis Riley, MD;  Location: [facility] OR;  Service: General;  Laterality: N/A;   TRIGGER FINGER RELEASE Right    thumb    Current Medications: Current Meds  Medication Sig   aspirin EC 81 MG tablet Take 81 mg by mouth See  admin instructions. Take 1 tablet (81 mg) by mouth 3 times week - Sunday, Wednesday and Friday - with lunch   atorvastatin (LIPITOR) 40 MG tablet TAKE 1 TABLET BY MOUTH  DAILY FOR CHOLESTEROL   azelastine (ASTELIN) 0.1 % nasal spray Place 2 sprays into both nostrils 2 (two) times daily. Use in each nostril as directed   bisoprolol (ZEBETA) 5 MG tablet TAKE 1 TABLET BY MOUTH  DAILY FOR BLOOD PRESSURE   buPROPion (WELLBUTRIN XL) 150 MG 24 hr tablet TAKE 1 TABLET BY MOUTH  DAILY FOR MOOD, FOCUS, AND  CONCENTRATION   Cholecalciferol (VITAMIN D-3) 1000 UNITS CAPS Take 5,000 Units by  mouth daily.    mirabegron ER (MYRBETRIQ) 50 MG TB24 tablet Take 1 tablet  Daily  for Bladder control   predniSONE (DELTASONE) 10 MG tablet 1 tab 3 x day for 2 days, then 1 tab 2 x day for 2 days, then 1 tab 1 x day for 3 days   tamsulosin (FLOMAX) 0.4 MG CAPS capsule Take 1 tablet at Bedtime for Prostate   [DISCONTINUED] furosemide (LASIX) 20 MG tablet TAKE 1 TABLET BY MOUTH EVERY DAY   Current Facility-Administered Medications for the 06/25/22 encounter (Office Visit) with Kace Hartje, Dani Gobble, MD  Medication   ipratropium-albuterol (DUONEB) 0.5-2.5 (3) MG/3ML nebulizer solution 3 mL     Allergies:   Patient has no known allergies.   Social History   Socioeconomic History   Marital status: Married    Spouse name: Not on file   Number of children: 1   Years of education: Not on file   Highest education level: Not on file  Occupational History   Occupation: retired  Tobacco Use   Smoking status: Former    Packs/day: 1.50    Years: 35.00    Total pack years: 52.50    Types: Cigarettes    Quit date: 02/05/2004    Years since quitting: 18.4   Smokeless tobacco: Never  Vaping Use   Vaping Use: Never used  Substance and Sexual Activity   Alcohol use: Yes    Comment: seldom   Drug use: No   Sexual activity: Not on file  Other Topics Concern   Not on file  Social History Narrative   Not on file   Social Determinants of Health   Financial Resource Strain: Not on file  Food Insecurity: Not on file  Transportation Needs: Not on file  Physical Activity: Not on file  Stress: Not on file  Social Connections: Not on file     Family History: The patient's family history includes COPD in his mother; Cancer in his father; Diverticulosis in his mother; Hypertension in his mother; Irritable bowel syndrome in his mother; Kidney disease in his father; Lung cancer in his father; Lupus in his mother; Other in his father; Stroke in his father. There is no history of Colon cancer, Esophageal  cancer, Rectal cancer, or Stomach cancer.  ROS:   Please see the history of present illness.     All other systems reviewed and are negative.  EKGs/Labs/Other Studies Reviewed:    The following studies were reviewed today:   EKG:  EKG is not ordered today.  The ekg ordered at his previous appointment demonstrates sinus bradycardia but is otherwise a normal tracing.  Normal QTc 412 ms.  Recent Labs: 05/14/2022: ALT 20; Hemoglobin 12.1; Magnesium 1.9; Platelets 249; TSH 1.09 06/25/2022: BNP 32.3; BUN 28; Creatinine, Ser 1.75; Potassium 5.0; Sodium 140  Recent Lipid Panel  Component Value Date/Time   CHOL 152 05/14/2022 0000   TRIG 102 05/14/2022 0000   HDL 61 05/14/2022 0000   CHOLHDL 2.5 05/14/2022 0000   VLDL 24 08/11/2016 1129   LDLCALC 72 05/14/2022 0000     Risk Assessment/Calculations:       Physical Exam:    VS:  BP 134/66 (BP Location: Left Arm, Patient Position: Sitting, Cuff Size: Normal)   Pulse 60   Ht '5\' 9"'$  (1.753 m)   Wt 191 lb 6.4 oz (86.8 kg)   SpO2 97%   BMI 28.26 kg/m     Wt Readings from Last 3 Encounters:  06/25/22 191 lb 6.4 oz (86.8 kg)  06/19/22 191 lb 9.6 oz (86.9 kg)  05/23/22 192 lb 12.8 oz (87.5 kg)     General: Alert, oriented x3, no distress,  Head: no evidence of trauma, PERRL, EOMI, no exophtalmos or lid lag, no myxedema, no xanthelasma; normal ears, nose and oropharynx Neck: normal jugular venous pulsations and no hepatojugular reflux; brisk carotid pulses without delay and no carotid bruits Chest: clear to auscultation, no signs of consolidation by percussion or palpation, normal fremitus, symmetrical and full respiratory excursions Cardiovascular: normal position and quality of the apical impulse, regular rhythm, normal first and second heart sounds, no murmurs, rubs or gallops Abdomen: no tenderness or distention, no masses by palpation, no abnormal pulsatility or arterial bruits, normal bowel sounds, no  hepatosplenomegaly Extremities: no clubbing, cyanosis; trace symmetrical ankle edema; 2+ radial, ulnar and brachial pulses bilaterally; 2+ right femoral, posterior tibial and dorsalis pedis pulses; 2+ left femoral, posterior tibial and dorsalis pedis pulses; no subclavian or femoral bruits Neurological: grossly nonfocal Psych: Normal mood and affect   ASSESSMENT:    1. SOB (shortness of breath)   2. Essential hypertension   3. Coronary artery calcification seen on CT scan   4. Aortic atherosclerosis ([place])   5. Hypercholesterolemia   6. COPD (chronic obstructive pulmonary disease) with chronic bronchitis   7. Stage 3a chronic kidney disease (HCC)     PLAN:    In order of problems listed above:  Dyspnea on exertion: All the workup to date suggest that he does not have a cardiac etiology for dyspnea with normal left ventricular systolic and diastolic function, no valvular abnormalities and normal BNP.  In view of the rich evidence of coronary aortic atherosclerosis seen on his imaging studies, it still worthwhile to make sure that his exertional dyspnea is not an anginal equivalent and we will schedule him for coronary CT angiography.  However, I think it is more likely that we will find that he has a pulmonary reason for dyspnea on exertion, most likely COPD.  Therefore his ankle swelling might simply be a symptom of cor pulmonale.   Coronary/aortic atherosclerosis: Check coronary CT angiogram.  Do not take furosemide on the day of the study and make sure that he is very well-hydrated before the contrast based procedure.  He is on aspirin, statin and beta-blocker.   HLP: On current treatment with statin his LDL is very close to target of 70 or less. HTN: Well-controlled with current medications which include highly beta-1 selective beta-blocker. COPD: This could explain his frequent episodes of pneumonia, shortness of breath with activity, lower extremity edema via right heart failure.  He has  a daily cough and occasional wheezing.  Will schedule him for formal pulmonary function testing.  He quit smoking many years ago. CKD3a: Creatinine has been very stable around 1.5 on labs  performed several times this year.  GFR is probably 45-50.  Slight worsening of creatinine after we started him on furosemide.  Needs to hydrate well before the coronary CT angiogram.           Medication Adjustments/Labs and Tests Ordered: Current medicines are reviewed at length with the patient today.  Concerns regarding medicines are outlined above.  Orders Placed This Encounter  Procedures   CT CORONARY MORPH W/CTA COR W/SCORE W/CA W/CM &/OR WO/CM   Basic metabolic panel   Brain natriuretic peptide   No orders of the defined types were placed in this encounter.   Patient Instructions  Medication Instructions:  Your physician recommends that you continue on your current medications as directed. Please refer to the Current Medication list given to you today.  *If you need a refill on your cardiac medications before your next appointment, please call your pharmacy*   Lab Work: Your physician recommends that you have labs drawn today: BMET & BNP  If you have labs (blood work) drawn today and your tests are completely normal, you will receive your results only by: [place] (if you have MyChart) OR A paper copy in the mail If you have any lab test that is abnormal or we need to change your treatment, we will call you to review the results.   Testing/Procedures: See below   Follow-Up: At [facility], you and your health needs are our priority.  As part of our continuing mission to provide you with exceptional heart care, we have created designated Provider Care Teams.  These Care Teams include your primary Cardiologist (physician) and Advanced Practice Providers (APPs -  Physician Assistants and Nurse Practitioners) who all work together to provide you with the care you need,  when you need it.  We recommend signing up for the patient portal called "MyChart".  Sign up information is provided on this After Visit Summary.  MyChart is used to connect with patients for Virtual Visits (Telemedicine).  Patients are able to view lab/test results, encounter notes, upcoming appointments, etc.  Non-urgent messages can be sent to your provider as well.   To learn more about what you can do with MyChart, go to [email or web address].    Your next appointment:   6 month(s)  The format for your next appointment:   In Person  Provider:   Sanda Klein, MD     Other Instructions   Your cardiac CT will be scheduled at the below location:   [facility] [address] [place], [place] [address] [phone]   If scheduled at [facility], please arrive at the [facility] and Children's Entrance (Entrance C2) of [facility] 30 minutes prior to test start time. You can use the FREE valet parking offered at entrance C (encouraged to control the heart rate for the test)  Proceed to the [facility] Radiology Department (first floor) to check-in and test prep.  All radiology patients and guests should use entrance C2 at [facility], accessed from [facility], even though the hospital's physical address listed is [address].     Please follow these instructions carefully (unless otherwise directed):  Hold all erectile dysfunction medications at least 3 days (72 hrs) prior to test. (Ie viagra, cialis, sildenafil, tadalafil, etc) We will administer nitroglycerin during this exam.   On the Night Before the Test: Be sure to Drink plenty of water. Do not consume any caffeinated/decaffeinated beverages or chocolate 12  hours prior to your test. Do not take any antihistamines 12 hours prior to your test.  On the Day of the Test: Drink plenty of water until 1 hour prior to the test. Do not eat any food 1 hour prior to  test. You may take your regular medications prior to the test.  HOLD Furosemide/Hydrochlorothiazide morning of the test.       After the Test: Drink plenty of water. After receiving IV contrast, you may experience a mild flushed feeling. This is normal. On occasion, you may experience a mild rash up to 24 hours after the test. This is not dangerous. If this occurs, you can take Benadryl 25 mg and increase your fluid intake. If you experience trouble breathing, this can be serious. If it is severe call 911 IMMEDIATELY. If it is mild, please call our office. If you take any of these medications: Glipizide/Metformin, Avandament, Glucavance, please do not take 48 hours after completing test unless otherwise instructed.  We will call to schedule your test 2-4 weeks out understanding that some insurance companies will need an authorization prior to the service being performed.   For non-scheduling related questions, please contact the cardiac imaging nurse navigator should you have any questions/concerns: Marchia Bond, Cardiac Imaging Nurse Navigator Gordy Clement, Cardiac Imaging Nurse Navigator [place] [facility] Direct Office Dial: [phone]   For scheduling needs, including cancellations and rescheduling, please call [place], [phone].    Signed, Sanda Klein, MD  06/27/2022 4:57 PM    [place]

## 2022-06-25 NOTE — Patient Instructions (Addendum)
Medication Instructions:  Your physician recommends that you continue on your current medications as directed. Please refer to the Current Medication list given to you today.  *If you need a refill on your cardiac medications before your next appointment, please call your pharmacy*   Lab Work: Your physician recommends that you have labs drawn today: BMET & BNP  If you have labs (blood work) drawn today and your tests are completely normal, you will receive your results only by: [place] (if you have MyChart) OR A paper copy in the mail If you have any lab test that is abnormal or we need to change your treatment, we will call you to review the results.   Testing/Procedures: See below   Follow-Up: At [facility], you and your health needs are our priority.  As part of our continuing mission to provide you with exceptional heart care, we have created designated Provider Care Teams.  These Care Teams include your primary Cardiologist (physician) and Advanced Practice Providers (APPs -  Physician Assistants and Nurse Practitioners) who all work together to provide you with the care you need, when you need it.  We recommend signing up for the patient portal called "MyChart".  Sign up information is provided on this After Visit Summary.  MyChart is used to connect with patients for Virtual Visits (Telemedicine).  Patients are able to view lab/test results, encounter notes, upcoming appointments, etc.  Non-urgent messages can be sent to your provider as well.   To learn more about what you can do with MyChart, go to [email or web address].    Your next appointment:   6 month(s)  The format for your next appointment:   In Person  Provider:   Sanda Klein, MD     Other Instructions   Your cardiac CT will be scheduled at the below location:   [facility] [address] [place], [place] [address] [phone]   If scheduled at [facility],  please arrive at the [facility] and Children's Entrance (Entrance C2) of [facility] 30 minutes prior to test start time. You can use the FREE valet parking offered at entrance C (encouraged to control the heart rate for the test)  Proceed to the [facility] Radiology Department (first floor) to check-in and test prep.  All radiology patients and guests should use entrance C2 at [facility], accessed from [facility], even though the hospital's physical address listed is [address]     Please follow these instructions carefully (unless otherwise directed):  Hold all erectile dysfunction medications at least 3 days (72 hrs) prior to test. (Ie viagra, cialis, sildenafil, tadalafil, etc) We will administer nitroglycerin during this exam.   On the Night Before the Test: Be sure to Drink plenty of water. Do not consume any caffeinated/decaffeinated beverages or chocolate 12 hours prior to your test. Do not take any antihistamines 12 hours prior to your test.  On the Day of the Test: Drink plenty of water until 1 hour prior to the test. Do not eat any food 1 hour prior to test. You may take your regular medications prior to the test.  HOLD Furosemide/Hydrochlorothiazide morning of the test.       After the Test: Drink plenty of water. After receiving IV contrast, you may experience a mild flushed feeling. This is normal. On occasion, you may experience a mild rash up to 24 hours after the test. This is not dangerous. If this occurs,  you can take Benadryl 25 mg and increase your fluid intake. If you experience trouble breathing, this can be serious. If it is severe call 911 IMMEDIATELY. If it is mild, please call our office. If you take any of these medications: Glipizide/Metformin, Avandament, Glucavance, please do not take 48 hours after completing test unless otherwise instructed.  We will call to schedule your test 2-4 weeks out understanding that  some insurance companies will need an authorization prior to the service being performed.   For non-scheduling related questions, please contact the cardiac imaging nurse navigator should you have any questions/concerns: Marchia Bond, Cardiac Imaging Nurse Navigator Gordy Clement, Cardiac Imaging Nurse Navigator  Heart and Vascular Services Direct Office Dial: [phone]   For scheduling needs, including cancellations and rescheduling, please call [place], [phone].

## 2022-06-26 ENCOUNTER — Other Ambulatory Visit: Payer: Self-pay

## 2022-06-26 LAB — BASIC METABOLIC PANEL
BUN/Creatinine Ratio: 16 (ref 10–24)
BUN: 28 mg/dL — ABNORMAL HIGH (ref 8–27)
CO2: 27 mmol/L (ref 20–29)
Calcium: 10 mg/dL (ref 8.6–10.2)
Chloride: 101 mmol/L (ref 96–106)
Creatinine, Ser: 1.75 mg/dL — ABNORMAL HIGH (ref 0.76–1.27)
Glucose: 88 mg/dL (ref 70–99)
Potassium: 5 mmol/L (ref 3.5–5.2)
Sodium: 140 mmol/L (ref 134–144)
eGFR: 41 mL/min/{1.73_m2} — ABNORMAL LOW (ref >59)

## 2022-06-26 LAB — BRAIN NATRIURETIC PEPTIDE: BNP: 32.3 pg/mL (ref 0.0–100.0)

## 2022-06-27 ENCOUNTER — Encounter: Payer: Self-pay | Admitting: Cardiovascular Disease

## 2022-06-30 ENCOUNTER — Telehealth (HOSPITAL_COMMUNITY): Payer: Self-pay | Admitting: *Deleted

## 2022-06-30 NOTE — Telephone Encounter (Signed)
Reaching out to patient to offer assistance regarding upcoming cardiac imaging study; pt verbalizes understanding of appt date/time, parking situation and where to check in, pre-test NPO status and verified current allergies; name and call back number provided for further questions should they arise  Gordy Clement RN Navigator Cardiac [place] [facility] [phone] office [phone] cell  Patient to take his daily Bisoprolol and is aware to arrive at 11:30am.

## 2022-07-01 ENCOUNTER — Ambulatory Visit (HOSPITAL_COMMUNITY)
Admission: RE | Admit: 2022-07-01 | Discharge: 2022-07-01 | Disposition: A | Discharge status: Home / Self Care | Payer: Medicare Other | Source: Ambulatory Visit | Attending: Cardiovascular Disease | Admitting: Cardiovascular Disease

## 2022-07-01 DIAGNOSIS — K449 Diaphragmatic hernia without obstruction or gangrene: Secondary | ICD-10-CM | POA: Insufficient documentation

## 2022-07-01 DIAGNOSIS — I10 Essential (primary) hypertension: Secondary | ICD-10-CM | POA: Diagnosis not present

## 2022-07-01 DIAGNOSIS — R931 Abnormal findings on diagnostic imaging of heart and coronary circulation: Secondary | ICD-10-CM | POA: Insufficient documentation

## 2022-07-01 DIAGNOSIS — R0602 Shortness of breath: Secondary | ICD-10-CM | POA: Diagnosis not present

## 2022-07-01 DIAGNOSIS — I2510 Atherosclerotic heart disease of native coronary artery without angina pectoris: Secondary | ICD-10-CM | POA: Insufficient documentation

## 2022-07-01 DIAGNOSIS — R079 Chest pain, unspecified: Secondary | ICD-10-CM | POA: Diagnosis not present

## 2022-07-01 MED ORDER — NITROGLYCERIN 0.4 MG SL SUBL
0.8000 mg | SUBLINGUAL_TABLET | Freq: Once | SUBLINGUAL | Status: AC
  Administered 2022-07-01: 0.8 mg via SUBLINGUAL

## 2022-07-01 MED ORDER — IOHEXOL 350 MG/ML SOLN
100.0000 mL | Freq: Once | INTRAVENOUS | Status: AC | PRN
  Administered 2022-07-01: 100 mL via INTRAVENOUS

## 2022-07-01 MED ORDER — NITROGLYCERIN 0.4 MG SL SUBL
SUBLINGUAL_TABLET | SUBLINGUAL | Status: AC
  Filled 2022-07-01: qty 2

## 2022-07-02 ENCOUNTER — Other Ambulatory Visit: Payer: Self-pay | Admitting: Cardiology

## 2022-07-02 DIAGNOSIS — R931 Abnormal findings on diagnostic imaging of heart and coronary circulation: Secondary | ICD-10-CM

## 2022-07-03 ENCOUNTER — Ambulatory Visit (HOSPITAL_BASED_OUTPATIENT_CLINIC_OR_DEPARTMENT_OTHER)
Admission: RE | Admit: 2022-07-03 | Discharge: 2022-07-03 | Disposition: A | Discharge status: Home / Self Care | Payer: Medicare Other | Source: Ambulatory Visit | Attending: Cardiology | Admitting: Cardiology

## 2022-07-03 DIAGNOSIS — R931 Abnormal findings on diagnostic imaging of heart and coronary circulation: Secondary | ICD-10-CM

## 2022-07-03 DIAGNOSIS — K449 Diaphragmatic hernia without obstruction or gangrene: Secondary | ICD-10-CM | POA: Diagnosis not present

## 2022-07-03 DIAGNOSIS — I10 Essential (primary) hypertension: Secondary | ICD-10-CM | POA: Diagnosis not present

## 2022-07-03 DIAGNOSIS — R0602 Shortness of breath: Secondary | ICD-10-CM | POA: Diagnosis not present

## 2022-07-03 DIAGNOSIS — I2510 Atherosclerotic heart disease of native coronary artery without angina pectoris: Secondary | ICD-10-CM | POA: Diagnosis not present

## 2022-07-12 ENCOUNTER — Other Ambulatory Visit: Payer: Self-pay | Admitting: Nurse Practitioner

## 2022-07-17 ENCOUNTER — Ambulatory Visit (HOSPITAL_COMMUNITY)
Admission: RE | Admit: 2022-07-17 | Discharge: 2022-07-17 | Disposition: A | Discharge status: Home / Self Care | Payer: Medicare Other | Source: Ambulatory Visit | Attending: Cardiovascular Disease | Admitting: Cardiovascular Disease

## 2022-07-17 DIAGNOSIS — R0602 Shortness of breath: Secondary | ICD-10-CM | POA: Diagnosis not present

## 2022-07-17 LAB — PULMONARY FUNCTION TEST
DL/VA % pred: 103 %
DL/VA: 4.13 ml/min/mmHg/L
DLCO unc % pred: 71 %
DLCO unc: 17.75 ml/min/mmHg
FEF 25-75 Post: 2.08 L/sec
FEF 25-75 Pre: 1.84 L/sec
FEF2575-%Change-Post: 13 %
FEF2575-%Pred-Post: 93 %
FEF2575-%Pred-Pre: 82 %
FEV1-%Change-Post: 2 %
FEV1-%Pred-Post: 71 %
FEV1-%Pred-Pre: 70 %
FEV1-Post: 2.17 L
FEV1-Pre: 2.12 L
FEV1FVC-%Change-Post: 2 %
FEV1FVC-%Pred-Pre: 106 %
FEV6-%Change-Post: 0 %
FEV6-%Pred-Post: 70 %
FEV6-%Pred-Pre: 69 %
FEV6-Post: 2.73 L
FEV6-Pre: 2.72 L
FEV6FVC-%Change-Post: 0 %
FEV6FVC-%Pred-Post: 106 %
FEV6FVC-%Pred-Pre: 105 %
FVC-%Change-Post: 0 %
FVC-%Pred-Post: 65 %
FVC-%Pred-Pre: 65 %
FVC-Post: 2.74 L
FVC-Pre: 2.73 L
Post FEV1/FVC ratio: 79 %
Post FEV6/FVC ratio: 100 %
Pre FEV1/FVC ratio: 77 %
Pre FEV6/FVC Ratio: 100 %
RV % pred: 91 %
RV: 2.26 L
TLC % pred: 72 %
TLC: 4.97 L

## 2022-07-17 MED ORDER — ALBUTEROL SULFATE (2.5 MG/3ML) 0.083% IN NEBU
2.5000 mg | INHALATION_SOLUTION | Freq: Once | RESPIRATORY_TRACT | Status: AC
  Administered 2022-07-17: 2.5 mg via RESPIRATORY_TRACT

## 2022-07-21 ENCOUNTER — Other Ambulatory Visit: Payer: Self-pay | Admitting: *Deleted

## 2022-07-21 DIAGNOSIS — R942 Abnormal results of pulmonary function studies: Secondary | ICD-10-CM

## 2022-07-29 ENCOUNTER — Other Ambulatory Visit: Payer: Self-pay | Admitting: Nurse Practitioner

## 2022-07-29 DIAGNOSIS — F325 Major depressive disorder, single episode, in full remission: Secondary | ICD-10-CM

## 2022-08-06 ENCOUNTER — Ambulatory Visit: Payer: Medicare Other | Admitting: Pulmonary Disease

## 2022-08-06 ENCOUNTER — Encounter: Payer: Self-pay | Admitting: Pulmonary Disease

## 2022-08-06 VITALS — BP 128/70 | HR 60 | Ht 69.0 in | Wt 193.6 lb

## 2022-08-06 DIAGNOSIS — R0602 Shortness of breath: Secondary | ICD-10-CM

## 2022-08-06 DIAGNOSIS — J984 Other disorders of lung: Secondary | ICD-10-CM

## 2022-08-06 DIAGNOSIS — R942 Abnormal results of pulmonary function studies: Secondary | ICD-10-CM | POA: Diagnosis not present

## 2022-08-06 MED ORDER — TRELEGY ELLIPTA 100-62.5-25 MCG/ACT IN AEPB: 1.0000 | INHALATION_SPRAY | Freq: Every day | RESPIRATORY_TRACT | 0 refills | Status: DC

## 2022-08-06 NOTE — Progress Notes (Signed)
Synopsis: Referred in February 2024 for abnormal pulmonary function tests  Subjective:   PATIENT ID: [name] Park GENDER: male DOB: [birth year], MRN: [id number]  HPI  Chief Complaint  Patient presents with   Consult    Referred by cardiology for abnormal PFT that was done in January. States he has had DOE for the past few years.    [name] Park is a 72 year old male, former smoker with history of GERD, HLD, and hypertension who is referred to pulmonary clinic for abnormal PFTs.   PFTs show mild restriction and mild diffusion defect.   He reports exertional dyspnea, cough that is occasionally productive and wheezing since the fall. He tried breztri inhaler 2 puffs twice daily for a couple weeks with out improvement in his symptoms. He reports a fairly sedentary life. He does yard work during the spring, summer and fall. He denies joint pains or rashes.  He quit smoking in 2005. He smoked 30+ years, 1-2 packs. His mother had emphysema and lupus, she was a never smoker. He had second hand smoke exposure in childhood from his father. He retired from the [employer], 28 years. No pets at home. No water or mold damage at home.   Past Medical History:  Diagnosis Date   Acute appendicitis 11/24/2016   COPD (chronic obstructive pulmonary disease) (HCC)    GERD (gastroesophageal reflux disease)    Hematuria    HLD (hyperlipidemia)    Hypertension    IBS (irritable bowel syndrome)    Pre-diabetes    Vitamin D deficiency      Family History  Problem Relation Age of Onset   Irritable bowel syndrome Mother    Diverticulosis Mother    Lupus Mother    COPD Mother    Hypertension Mother    Lung cancer Father    Other Father        bladder polyps   Stroke Father    Kidney disease Father    Cancer Father    Colon cancer Neg Hx    Esophageal cancer Neg Hx    Rectal cancer Neg Hx    Stomach cancer Neg Hx      Social History   Socioeconomic History   Marital status: Married     Spouse name: Not on file   Number of children: 1   Years of education: Not on file   Highest education level: Not on file  Occupational History   Occupation: retired  Tobacco Use   Smoking status: Former    Packs/day: 1.50    Years: 35.00    Total pack years: 52.50    Types: Cigarettes    Quit date: 02/05/2004    Years since quitting: 18.5   Smokeless tobacco: Never  Vaping Use   Vaping Use: Never used  Substance and Sexual Activity   Alcohol use: Yes    Comment: seldom   Drug use: No   Sexual activity: Not on file  Other Topics Concern   Not on file  Social History Narrative   Not on file   Social Determinants of Health   Financial Resource Strain: Not on file  Food Insecurity: Not on file  Transportation Needs: Not on file  Physical Activity: Not on file  Stress: Not on file  Social Connections: Not on file  Intimate Partner Violence: Not on file     No Known Allergies   Outpatient Medications Prior to Visit  Medication Sig Dispense Refill   aspirin EC 81  MG tablet Take 81 mg by mouth See admin instructions. Take 1 tablet (81 mg) by mouth 3 times week - Sunday, Wednesday and Friday - with lunch     atorvastatin (LIPITOR) 40 MG tablet TAKE 1 TABLET BY MOUTH  DAILY FOR CHOLESTEROL 90 tablet 3   azelastine (ASTELIN) 0.1 % nasal spray Place 2 sprays into both nostrils 2 (two) times daily. Use in each nostril as directed 30 mL 2   bisoprolol (ZEBETA) 5 MG tablet TAKE 1 TABLET BY MOUTH DAILY FOR BLOOD PRESSURE 90 tablet 3   buPROPion (WELLBUTRIN XL) 150 MG 24 hr tablet TAKE 1 TABLET BY MOUTH DAILY FOR MOOD, FOCUS, AND CONCENTRATION 90 tablet 3   Cholecalciferol (VITAMIN D-3) 1000 UNITS CAPS Take 5,000 Units by mouth daily.      furosemide (LASIX) 20 MG tablet Take 20 mg every other day 90 tablet 3   mirabegron ER (MYRBETRIQ) 50 MG TB24 tablet Take 1 tablet  Daily  for Bladder control 90 tablet 3   tamsulosin (FLOMAX) 0.4 MG CAPS capsule Take 1 tablet at Bedtime for  Prostate 90 capsule 3   predniSONE (DELTASONE) 10 MG tablet 1 tab 3 x day for 2 days, then 1 tab 2 x day for 2 days, then 1 tab 1 x day for 3 days 13 tablet 0   Facility-Administered Medications Prior to Visit  Medication Dose Route Frequency Provider Last Rate Last Admin   ipratropium-albuterol (DUONEB) 0.5-2.5 (3) MG/3ML nebulizer solution 3 mL  3 mL Nebulization Once Cranford, Tonya, NP       Review of Systems  Constitutional:  Negative for chills, fever, malaise/fatigue and weight loss.  HENT:  Positive for congestion. Negative for sinus pain and sore throat.   Eyes: Negative.   Respiratory:  Positive for cough and shortness of breath. Negative for hemoptysis, sputum production and wheezing.   Cardiovascular:  Negative for chest pain, palpitations, orthopnea, claudication and leg swelling.  Gastrointestinal:  Positive for heartburn. Negative for abdominal pain, nausea and vomiting.  Genitourinary: Negative.   Musculoskeletal:  Positive for joint pain. Negative for myalgias.  Skin:  Negative for rash.  Neurological:  Negative for weakness.  Endo/Heme/Allergies: Negative.   Psychiatric/Behavioral:  Positive for depression. The patient is nervous/anxious.    Objective:   Vitals:   08/06/22 1513  BP: 128/70  Pulse: 60  SpO2: 99%  Weight: 193 lb 9.6 oz (87.8 kg)  Height: 5' 9"$  (1.753 m)   Physical Exam Constitutional:      General: He is not in acute distress. HENT:     Head: Normocephalic and atraumatic.  Eyes:     Extraocular Movements: Extraocular movements intact.     Conjunctiva/sclera: Conjunctivae normal.     Pupils: Pupils are equal, round, and reactive to light.  Cardiovascular:     Rate and Rhythm: Normal rate and regular rhythm.     Pulses: Normal pulses.     Heart sounds: Normal heart sounds. No murmur heard. Abdominal:     General: Bowel sounds are normal.     Palpations: Abdomen is soft.  Musculoskeletal:     Right lower leg: No edema.     Left lower leg:  No edema.  Lymphadenopathy:     Cervical: No cervical adenopathy.  Skin:    General: Skin is warm and dry.  Neurological:     General: No focal deficit present.     Mental Status: He is alert.  Psychiatric:        Mood  and Affect: Mood normal.        Behavior: Behavior normal.        Thought Content: Thought content normal.        Judgment: Judgment normal.    CBC    Component Value Date/Time   WBC 5.3 05/14/2022 0000   RBC 3.90 (L) 05/14/2022 0000   HGB 12.1 (L) 05/14/2022 0000   HCT 35.8 (L) 05/14/2022 0000   PLT 249 05/14/2022 0000   MCV 91.8 05/14/2022 0000   MCH 31.0 05/14/2022 0000   MCHC 33.8 05/14/2022 0000   RDW 12.3 05/14/2022 0000   LYMPHSABS 1,362 05/14/2022 0000   MONOABS 0.8 12/12/2019 1119   EOSABS 270 05/14/2022 0000   BASOSABS 42 05/14/2022 0000      Latest Ref Rng & Units 06/25/2022   12:05 PM 05/14/2022   12:00 AM 01/30/2022   10:51 AM  BMP  Glucose 70 - 99 mg/dL 88  100  80   BUN 8 - 27 mg/dL 28  19  21   $ Creatinine 0.76 - 1.27 mg/dL 1.75  1.49  1.50   BUN/Creat Ratio 10 - 24 16  13  14   $ Sodium 134 - 144 mmol/L 140  140  141   Potassium 3.5 - 5.2 mmol/L 5.0  5.0  4.7   Chloride 96 - 106 mmol/L 101  103  106   CO2 20 - 29 mmol/L 27  28  30   $ Calcium 8.6 - 10.2 mg/dL 10.0  9.9  9.6    Chest imaging: CTA Coronary Scan 07/01/22 Scattered pulmonary parenchymal scarring. Small hiatal hernia. Calcified granulomas.  PFT:    Latest Ref Rng & Units 07/17/2022   12:20 PM  PFT Results  FVC-Pre L 2.73   FVC-Predicted Pre % 65   FVC-Post L 2.74   FVC-Predicted Post % 65   Pre FEV1/FVC % % 77   Post FEV1/FCV % % 79   FEV1-Pre L 2.12   FEV1-Predicted Pre % 70   FEV1-Post L 2.17   DLCO uncorrected ml/min/mmHg 17.75   DLCO UNC% % 71   DLVA Predicted % 103   TLC L 4.97   TLC % Predicted % 72   RV % Predicted % 91     Labs:  Path:  Echo 05/2022: LV EF 60-65%. RV systolic function and size are normal.   Heart Catheterization:      Assessment & Plan:   Restrictive lung disease - Plan: CT CHEST HIGH RESOLUTION, Pulse oximetry, overnight  Decreased diffusion capacity - Plan: CT CHEST HIGH RESOLUTION, Pulse oximetry, overnight  Shortness of breath  Discussion: [name] Park is a 72 year old male, former smoker with history of GERD, HLD, and hypertension who is referred to pulmonary clinic for abnormal PFTs.   He has mild restriction and diffusion defect on PFTs and scattered areas of scarring on recent CT cardiac scan concerning for possible ILD.  We will check HRCT Chest and overnight oximitry testing.   We will trial him on trelegy 100, 1 puff daily for the next month and monitor for improvement.   He did not have SpO2 desaturation with 3 laps of walking, maintained at 97% throughout.   Follow up in 6 weeks.  Freda Jackson, MD [facility] Office: [phone]     Current Outpatient Medications:    aspirin EC 81 MG tablet, Take 81 mg by mouth See admin instructions. Take 1 tablet (81 mg) by mouth 3 times week -  Sunday, Wednesday and Friday - with lunch, Disp: , Rfl:    atorvastatin (LIPITOR) 40 MG tablet, TAKE 1 TABLET BY MOUTH  DAILY FOR CHOLESTEROL, Disp: 90 tablet, Rfl: 3   azelastine (ASTELIN) 0.1 % nasal spray, Place 2 sprays into both nostrils 2 (two) times daily. Use in each nostril as directed, Disp: 30 mL, Rfl: 2   bisoprolol (ZEBETA) 5 MG tablet, TAKE 1 TABLET BY MOUTH DAILY FOR BLOOD PRESSURE, Disp: 90 tablet, Rfl: 3   buPROPion (WELLBUTRIN XL) 150 MG 24 hr tablet, TAKE 1 TABLET BY MOUTH DAILY FOR MOOD, FOCUS, AND CONCENTRATION, Disp: 90 tablet, Rfl: 3   Cholecalciferol (VITAMIN D-3) 1000 UNITS CAPS, Take 5,000 Units by mouth daily. , Disp: , Rfl:    Fluticasone-Umeclidin-Vilant (TRELEGY ELLIPTA) 100-62.5-25 MCG/ACT AEPB, Inhale 1 puff into the lungs daily., Disp: 2 each, Rfl: 0   furosemide (LASIX) 20 MG tablet, Take 20 mg every other day, Disp: 90 tablet, Rfl: 3    mirabegron ER (MYRBETRIQ) 50 MG TB24 tablet, Take 1 tablet  Daily  for Bladder control, Disp: 90 tablet, Rfl: 3   tamsulosin (FLOMAX) 0.4 MG CAPS capsule, Take 1 tablet at Bedtime for Prostate, Disp: 90 capsule, Rfl: 3  Current Facility-Administered Medications:    ipratropium-albuterol (DUONEB) 0.5-2.5 (3) MG/3ML nebulizer solution 3 mL, 3 mL, Nebulization, Once, Darrol Jump, NP

## 2022-08-06 NOTE — Patient Instructions (Addendum)
You have mild restriction and diffusion defects on your lungs.  We will check CT Chest scan to further evaluate the scarring on your lungs.   Try trelegy ellipta 1 puff daily - rinse mouth out after each use  Please let us know if you notice benefit while on this inhaler and we will send in prescription  We will check your oxygen levels today while walking and schedule you for an overnight oxygen test  Follow up in 6 weeks

## 2022-08-21 DIAGNOSIS — G4730 Sleep apnea, unspecified: Secondary | ICD-10-CM | POA: Diagnosis not present

## 2022-08-21 DIAGNOSIS — R0683 Snoring: Secondary | ICD-10-CM | POA: Diagnosis not present

## 2022-08-22 ENCOUNTER — Other Ambulatory Visit: Payer: Self-pay | Admitting: *Deleted

## 2022-08-22 MED ORDER — FUROSEMIDE 20 MG PO TABS: ORAL_TABLET | ORAL | 3 refills | Status: DC

## 2022-08-25 ENCOUNTER — Ambulatory Visit (HOSPITAL_COMMUNITY)
Admission: RE | Admit: 2022-08-25 | Discharge: 2022-08-25 | Disposition: A | Discharge status: Home / Self Care | Payer: Medicare Other | Source: Ambulatory Visit | Attending: Pulmonary Disease | Admitting: Pulmonary Disease

## 2022-08-25 DIAGNOSIS — R942 Abnormal results of pulmonary function studies: Secondary | ICD-10-CM | POA: Diagnosis not present

## 2022-08-25 DIAGNOSIS — J984 Other disorders of lung: Secondary | ICD-10-CM | POA: Diagnosis not present

## 2022-09-02 NOTE — Progress Notes (Unsigned)
MEDICARE ANNUAL WELLNESS AND 3 MONTH FOLLOW UP   Assessment / Plan:    Encounter for annual medicare wellness visit Yearly Tetanus vaccination due this year, last 2012  Essential hypertension - continue medications, DASH diet, exercise and monitor at home. Call if greater than 130/80.  -     CBC with Differential/Platelet -     CMP/GFR -     TSH  Mixed hyperlipidemia Continue medications: Atorvastatin '40mg'$ , three days a week Discussed dietary and exercise modifications Low fat diet -Lipid panel  Thoracic aorta atherosclerosis ([place]) Per numerous CXR/CTs 10/15/19 Control blood pressure, cholesterol, glucose, increase exercise.   COPD (chronic obstructive pulmonary disease) with chronic bronchitis ([place]) Former smoker, dx per imaging, dyspnea with exertion, given trelegy sample to try with coupon, instructions and demonstration given today. He will message back with progress.   Chronic kidney disease, stage 3 (HCC) Increase fluids, avoid NSAIDS, monitor sugars, will monitor -     CMP WITH GFR  Medication management -     Magnesium  Other abnormal glucose Discussed disease progression and risks Discussed diet/exercise, weight management and risk modification  Rhinorrhea Atrovent nasal spray to use as needed Continue to monitor  Vitamin D deficiency Continue supplement  Generalized anxiety disorder Continue wellbutrin  Overweight (BMI 25.0-29.9) - long discussion about weight loss, diet, and exercise -recommended diet heavy in fruits and veggies and low in animal meats, cheeses, and dairy products  54 pack year smoking history/lung nodules 54 pack, no symptoms, quit 15 years ago Has aged out will get yearly CXR last 05/20/21 WNL- will order repeat today   Future Appointments  Date Time Provider [place]  09/03/2022 11:00 AM Alycia Rossetti, NP [facility] None  09/23/2022  2:00 PM Freddi Starr, MD [facility] None  05/15/2023 10:00 AM Alycia Rossetti, NP [facility] None    Continue diet and meds as discussed. Further disposition pending results of labs. During the course of the visit the patient was educated and counseled about appropriate screening and preventive services including:    Pneumococcal vaccine  Influenza vaccine Td vaccine Screening electrocardiogram Screening mammography Bone densitometry screening Colorectal cancer screening Diabetes screening Glaucoma screening Nutrition counseling  Advanced directives: given info/requested  HPI 72 y.o. male  presents for 3 month follow up with hypertension, hyperlipidemia, prediabetes and vitamin D and medicare wellness visit.    He is on Myrbetriq and Tamsulosin for OAB per Dr. Karsten Ro. He is waking 2-3 times a night.  He reports having to go to the bathroom 8-10 times a day. Better control than Ditropan  He has been noticing his nose is running a lot more. Worse when he first gets up or when eating  He is on wellbutrin for anxiety/depression which helps.   BMI is There is no height or weight on file to calculate BMI., he has not been working on diet and exercise, walks more when warm, will restart in the spring.  Wt Readings from Last 3 Encounters:  08/06/22 193 lb 9.6 oz (87.8 kg)  06/25/22 191 lb 6.4 oz (86.8 kg)  06/19/22 191 lb 9.6 oz (86.9 kg)    His blood pressure has been controlled at home, today their BP is  .   BP Readings from Last 3 Encounters:  08/06/22 128/70  07/01/22 (!) 105/43  06/25/22 134/66     He does not workout. He denies chest pain, dizziness.   He is a former smoker, 54 pack years, quit in 2005, does have diagnosis  of COPD via CXR in 2013, today he endorses ongoing exertional dyspnea with exercise, denies fatigue, CP or other accompaniments. He currently uses PRN albuterol which does work well for him. He has not tried any daily inhalers. Denies coughing or AM secretions.   Had low dose screening chest CT in 11/2018 that showed multiple  nodules felt to be benign appearing, recommended annual follow up.  CT also shows aortic atherosclerosis and emphysema.    He is on cholesterol medication, he is taking one tablet, lipitor 3 days a week and denies myalgias. His cholesterol is at goal. The cholesterol last visit was:   Lab Results  Component Value Date   CHOL 152 05/14/2022   HDL 61 05/14/2022   LDLCALC 72 05/14/2022   TRIG 102 05/14/2022   CHOLHDL 2.5 05/14/2022    He has been working on diet and exercise for prediabetes, and denies foot ulcerations, hyperglycemia, hypoglycemia , increased appetite, nausea, paresthesia of the feet, polydipsia, polyuria, visual disturbances, vomiting and weight loss. Last A1C in the office was:  Lab Results  Component Value Date   HGBA1C 5.6 05/14/2022   Patient is on Vitamin D supplement.  Lab Results  Component Value Date   VD25OH 59 05/14/2022    He has labile CKD III GFR values which are monitored closely.  Lab Results  Component Value Date   [id number] (L) 11/28/2020     Current Medications:  Current Outpatient Medications on File Prior to Visit  Medication Sig Dispense Refill   aspirin EC 81 MG tablet Take 81 mg by mouth See admin instructions. Take 1 tablet (81 mg) by mouth 3 times week - Sunday, Wednesday and Friday - with lunch     atorvastatin (LIPITOR) 40 MG tablet TAKE 1 TABLET BY MOUTH  DAILY FOR CHOLESTEROL 90 tablet 3   bisoprolol (ZEBETA) 5 MG tablet TAKE 1 TABLET BY MOUTH DAILY FOR BLOOD PRESSURE 90 tablet 3   buPROPion (WELLBUTRIN XL) 150 MG 24 hr tablet TAKE 1 TABLET BY MOUTH DAILY FOR MOOD, FOCUS, AND CONCENTRATION 90 tablet 3   Cholecalciferol (VITAMIN D-3) 1000 UNITS CAPS Take 5,000 Units by mouth daily.      Fluticasone-Umeclidin-Vilant (TRELEGY ELLIPTA) 100-62.5-25 MCG/ACT AEPB Inhale 1 puff into the lungs daily. 2 each 0   furosemide (LASIX) 20 MG tablet Take 20 mg every other day 90 tablet 3   mirabegron ER (MYRBETRIQ) 50 MG TB24 tablet Take 1 tablet   Daily  for Bladder control 90 tablet 3   tamsulosin (FLOMAX) 0.4 MG CAPS capsule Take 1 tablet at Bedtime for Prostate 90 capsule 3   Current Facility-Administered Medications on File Prior to Visit  Medication Dose Route Frequency Provider Last Rate Last Admin   ipratropium-albuterol (DUONEB) 0.5-2.5 (3) MG/3ML nebulizer solution 3 mL  3 mL Nebulization Once Darrol Jump, NP        Medical History:  Past Medical History:  Diagnosis Date   Acute appendicitis 11/24/2016   COPD (chronic obstructive pulmonary disease) (HCC)    GERD (gastroesophageal reflux disease)    Hematuria    HLD (hyperlipidemia)    Hypertension    IBS (irritable bowel syndrome)    Pre-diabetes    Vitamin D deficiency    Preventative care:  Immunization History  Administered Date(s) Administered   Influenza Whole 04/07/2013   Influenza, High Dose Seasonal PF 04/13/2014, 03/27/2015, 03/25/2016, 04/08/2018, 04/14/2019, 05/10/2020, 05/14/2021, 05/14/2022   Influenza-Unspecified 03/31/2017   PFIZER Comirnaty(Gray Top)Covid-19 Tri-Sucrose Vaccine 11/12/2020   PFIZER(Purple Top)SARS-COV-2  Vaccination 07/15/2019, 08/04/2019, 03/28/2020   PNEUMOCOCCAL CONJUGATE-20 05/14/2021   PPD Test 12/05/2013   Pfizer Covid-19 Vaccine Bivalent Booster 51yr & up 04/05/2021   Pneumococcal Conjugate-13 06/08/2014   Pneumococcal Polysaccharide-23 09/07/2014   Td 08/20/2021   Tdap 04/29/2011   Zoster, Live 06/12/2014   Health Maintenance  Topic Date Due   Zoster Vaccines- Shingrix (1 of 2) Never done   COVID-19 Vaccine (6 - 2023-24 season) 02/21/2022   Medicare Annual Wellness (AWV)  08/20/2022   COLONOSCOPY (Pts 45-413yrInsurance coverage will need to be confirmed)  02/27/2026   DTaP/Tdap/Td (3 - Td or Tdap) 08/21/2031   Pneumonia Vaccine 72Years old  Completed   INFLUENZA VACCINE  Completed   Hepatitis C Screening  Completed   HPV VACCINES  Aged Out   CT chest 11/2019 - nodules -has aged out following with CXR Ct AB  11/2016    Names of Other Physician/Practitioners you currently use: 1. [facility]- here for primary care 2. Dr. GrKaty Apoeye doctor, last visit 2022, yearly 3. Dr. WoJasmine Decemberlast visit 08/10/21, goes [id number] Care Team: McKUnk PintoD as PCP - General (Internal Medicine) CroSanda KleinD as PCP - Cardiology (Cardiology) LylKaty ApoD as Consulting Physician (Ophthalmology) StaLadene ArtistD as Consulting Physician (Gastroenterology) [facility]leene [facility] Pharmacist (Pharmacist)    Allergies No Known Allergies  SURGICAL HISTORY He  has a past surgical history that includes Cervical fusion; Trigger finger release (Right); Carpal tunnel release (Right); Hand surgery (Left); laparoscopic appendectomy (N/A, 11/24/2016); and Cataract extraction, bilateral (Bilateral). FAMILY HISTORY His family history includes COPD in his mother; Cancer in his father; Diverticulosis in his mother; Hypertension in his mother; Irritable bowel syndrome in his mother; Kidney disease in his father; Lung cancer in his father; Lupus in his mother; Other in his father; Stroke in his father. SOCIAL HISTORY He  reports that he quit smoking about 18 years ago. His smoking use included cigarettes. He has a 52.50 pack-year smoking history. He has never used smokeless tobacco. He reports current alcohol use. He reports that he does not use drugs.  MEDICARE WELLNESS OBJECTIVES: Physical activity:   Cardiac risk factors:   Depression/mood screen:      01/29/2022   10:07 PM  Depression screen PHQ 2/9  Decreased Interest 0  Down, Depressed, Hopeless 0  PHQ - 2 Score 0    ADLs:     01/30/2022    9:24 PM  In your present state of health, do you have any difficulty performing the following activities:  Hearing? 0  Vision? 0  Difficulty concentrating or making decisions? 0  Walking or climbing stairs? 0  Dressing or bathing? 0  Doing errands, shopping?  0     Cognitive Testing  Alert? Yes  Normal Appearance?Yes  Oriented to person? Yes  Place? Yes   Time? Yes  Recall of three objects?  Yes  Can perform simple calculations? Yes  Displays appropriate judgment?Yes  Can read the correct time from a watch face?Yes  EOL planning:      Review of Systems:  Review of Systems  Constitutional:  Negative for chills, fever and malaise/fatigue.  HENT:  Negative for congestion, ear pain and sore throat.   Respiratory:  Negative for cough, shortness of breath and wheezing.   Cardiovascular:  Negative for chest pain, palpitations and leg swelling.  Gastrointestinal:  Negative for blood in stool, constipation, diarrhea, heartburn and melena.  Genitourinary: Negative.  Musculoskeletal:  Negative for myalgias.  Skin: Negative.   Neurological:  Negative for dizziness, sensory change, loss of consciousness and headaches.  Psychiatric/Behavioral:  Negative for depression. The patient is not nervous/anxious and does not have insomnia.     Physical Exam: There were no vitals taken for this visit. Wt Readings from Last 3 Encounters:  08/06/22 193 lb 9.6 oz (87.8 kg)  06/25/22 191 lb 6.4 oz (86.8 kg)  06/19/22 191 lb 9.6 oz (86.9 kg)    General Appearance: Well nourished well developed, in no apparent distress. Eyes: PERRLA, EOMs, conjunctiva no swelling or erythema ENT/Mouth: Ear canals normal, without swelling, erythma, discharge.  TMs normal bilaterally.  Oropharynx moist, clear, without exudate, or postoropharyngeal swelling. Neck: Supple, thyroid normal,no cervical adenopathy  Respiratory: Respiratory effort normal, Breath sounds clear A&P without rhonchi, wheeze, or rale.  No retractions, no accessory usage. Cardio: RRR with no MRGs. Brisk peripheral pulses without edema.  Abdomen: Soft, + BS,  Non tender, no guarding, rebound, hernias, masses. Musculoskeletal: Full ROM, 5/5 strength, Normal gait Skin: Warm, dry without rashes, lesions,  ecchymosis.  Neuro: Awake and oriented X 3, Cranial nerves intact. Normal muscle tone, no cerebellar symptoms. Psych: Normal affect, Insight and Judgment appropriate.     Medicare Attestation I have personally reviewed: The patient's medical and social history Their use of alcohol, tobacco or illicit drugs Their current medications and supplements The patient's functional ability including ADLs,fall risks, home safety risks, cognitive, and hearing and visual impairment Diet and physical activities Evidence for depression or mood disorders   The patient's weight, height, BMI, and visual acuity have been recorded in the chart.  I have made referrals, counseling, and provided education to the patient based on review of the above and I have provided the patient with a written personalized care plan for preventive services.     Garnet Sierras, Laqueta Jean, DNP [facility] 09/02/2022  8:21 AM

## 2022-09-03 ENCOUNTER — Encounter: Payer: Self-pay | Admitting: Nurse Practitioner

## 2022-09-03 ENCOUNTER — Ambulatory Visit (INDEPENDENT_AMBULATORY_CARE_PROVIDER_SITE_OTHER): Payer: Medicare Other | Admitting: Nurse Practitioner

## 2022-09-03 ENCOUNTER — Telehealth: Payer: Self-pay | Admitting: Pulmonary Disease

## 2022-09-03 VITALS — BP 102/60 | HR 60 | Temp 97.7°F | Ht 69.0 in | Wt 193.8 lb

## 2022-09-03 DIAGNOSIS — I700 Atherosclerosis of aorta: Secondary | ICD-10-CM

## 2022-09-03 DIAGNOSIS — N3281 Overactive bladder: Secondary | ICD-10-CM | POA: Diagnosis not present

## 2022-09-03 DIAGNOSIS — N183 Chronic kidney disease, stage 3 unspecified: Secondary | ICD-10-CM | POA: Diagnosis not present

## 2022-09-03 DIAGNOSIS — Z79899 Other long term (current) drug therapy: Secondary | ICD-10-CM

## 2022-09-03 DIAGNOSIS — I10 Essential (primary) hypertension: Secondary | ICD-10-CM | POA: Diagnosis not present

## 2022-09-03 DIAGNOSIS — R7309 Other abnormal glucose: Secondary | ICD-10-CM | POA: Diagnosis not present

## 2022-09-03 DIAGNOSIS — Z0000 Encounter for general adult medical examination without abnormal findings: Secondary | ICD-10-CM

## 2022-09-03 DIAGNOSIS — J4489 Other specified chronic obstructive pulmonary disease: Secondary | ICD-10-CM | POA: Diagnosis not present

## 2022-09-03 DIAGNOSIS — R6889 Other general symptoms and signs: Secondary | ICD-10-CM

## 2022-09-03 DIAGNOSIS — Z0001 Encounter for general adult medical examination with abnormal findings: Secondary | ICD-10-CM | POA: Diagnosis not present

## 2022-09-03 DIAGNOSIS — R918 Other nonspecific abnormal finding of lung field: Secondary | ICD-10-CM

## 2022-09-03 DIAGNOSIS — F411 Generalized anxiety disorder: Secondary | ICD-10-CM

## 2022-09-03 DIAGNOSIS — E782 Mixed hyperlipidemia: Secondary | ICD-10-CM

## 2022-09-03 DIAGNOSIS — Z87891 Personal history of nicotine dependence: Secondary | ICD-10-CM | POA: Diagnosis not present

## 2022-09-03 DIAGNOSIS — E559 Vitamin D deficiency, unspecified: Secondary | ICD-10-CM

## 2022-09-03 DIAGNOSIS — J3489 Other specified disorders of nose and nasal sinuses: Secondary | ICD-10-CM

## 2022-09-03 DIAGNOSIS — E663 Overweight: Secondary | ICD-10-CM

## 2022-09-03 NOTE — Telephone Encounter (Signed)
Patient would like new RX for Trelegy. Pharmacy is [facility] [place]. Patient phone number is [phone].

## 2022-09-03 NOTE — Patient Instructions (Signed)

## 2022-09-04 LAB — CBC WITH DIFFERENTIAL/PLATELET
Absolute Monocytes: 662 cells/uL (ref 200–950)
Basophils Absolute: 43 cells/uL (ref 0–200)
Basophils Relative: 0.6 %
Eosinophils Absolute: 194 cells/uL (ref 15–500)
Eosinophils Relative: 2.7 %
HCT: 36.6 % — ABNORMAL LOW (ref 38.5–50.0)
Hemoglobin: 12.4 g/dL — ABNORMAL LOW (ref 13.2–17.1)
Lymphs Abs: 1800 cells/uL (ref 850–3900)
MCH: 30.5 pg (ref 27.0–33.0)
MCHC: 33.9 g/dL (ref 32.0–36.0)
MCV: 89.9 fL (ref 80.0–100.0)
MPV: 11.2 fL (ref 7.5–12.5)
Monocytes Relative: 9.2 %
Neutro Abs: 4500 cells/uL (ref 1500–7800)
Neutrophils Relative %: 62.5 %
Platelets: 247 10*3/uL (ref 140–400)
RBC: 4.07 10*6/uL — ABNORMAL LOW (ref 4.20–5.80)
RDW: 12.8 % (ref 11.0–15.0)
Total Lymphocyte: 25 %
WBC: 7.2 10*3/uL (ref 3.8–10.8)

## 2022-09-04 LAB — LIPID PANEL
Cholesterol: 128 mg/dL (ref <200)
HDL: 65 mg/dL (ref >=40)
LDL Cholesterol (Calc): 48 mg/dL (calc)
Non-HDL Cholesterol (Calc): 63 mg/dL (calc) (ref <130)
Total CHOL/HDL Ratio: 2 (calc) (ref <5.0)
Triglycerides: 74 mg/dL (ref <150)

## 2022-09-04 LAB — COMPLETE METABOLIC PANEL WITH GFR
AG Ratio: 1.4 (calc) (ref 1.0–2.5)
ALT: 17 U/L (ref 9–46)
AST: 22 U/L (ref 10–35)
Albumin: 3.9 g/dL (ref 3.6–5.1)
Alkaline phosphatase (APISO): 82 U/L (ref 35–144)
BUN/Creatinine Ratio: 13 (calc) (ref 6–22)
BUN: 20 mg/dL (ref 7–25)
CO2: 28 mmol/L (ref 20–32)
Calcium: 9.5 mg/dL (ref 8.6–10.3)
Chloride: 104 mmol/L (ref 98–110)
Creat: 1.49 mg/dL — ABNORMAL HIGH (ref 0.70–1.28)
Globulin: 2.7 g/dL (calc) (ref 1.9–3.7)
Glucose, Bld: 90 mg/dL (ref 65–99)
Potassium: 4.7 mmol/L (ref 3.5–5.3)
Sodium: 142 mmol/L (ref 135–146)
Total Bilirubin: 0.6 mg/dL (ref 0.2–1.2)
Total Protein: 6.6 g/dL (ref 6.1–8.1)
eGFR: 49 mL/min/{1.73_m2} — ABNORMAL LOW (ref >=60)

## 2022-09-04 LAB — MAGNESIUM: Magnesium: 2 mg/dL (ref 1.5–2.5)

## 2022-09-04 MED ORDER — TRELEGY ELLIPTA 100-62.5-25 MCG/ACT IN AEPB: 1.0000 | INHALATION_SPRAY | Freq: Every day | RESPIRATORY_TRACT | 4 refills | Status: DC

## 2022-09-04 NOTE — Telephone Encounter (Signed)
Called and spoke with patient. He stated that the Trelegy 123mg has worked for him and he wishes to have a RX sent to his pharmacy. I advised him I would go ahead and send this in for him. Confirmed his pharmacy.   Nothing further needed at time of call.

## 2022-09-13 ENCOUNTER — Telehealth: Payer: Self-pay | Admitting: Pulmonary Disease

## 2022-09-13 NOTE — Telephone Encounter (Signed)
Please let patient know that his ONO does not indicate he qualifies for supplemental oxygen at night while sleeping.  Thanks, JD

## 2022-09-15 NOTE — Telephone Encounter (Signed)
Discussed ONO results with patient. He verbalized understanding. NFN.

## 2022-09-23 ENCOUNTER — Ambulatory Visit: Payer: Medicare Other | Admitting: Pulmonary Disease

## 2022-09-23 ENCOUNTER — Encounter: Payer: Self-pay | Admitting: Pulmonary Disease

## 2022-09-23 VITALS — BP 128/62 | HR 62 | Temp 98.2°F | Ht 69.0 in | Wt 198.2 lb

## 2022-09-23 DIAGNOSIS — J984 Other disorders of lung: Secondary | ICD-10-CM | POA: Diagnosis not present

## 2022-09-23 NOTE — Patient Instructions (Addendum)
Your CT Chest scan does not indicate any changes in scarring of the lungs since 2020. 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  Continue trelegy ellipta 1 puff daily - rinse mouth out after each use.   We will refer you to pulmonary rehab  Follow up in 6 months

## 2022-09-23 NOTE — Progress Notes (Unsigned)
Synopsis: Referred in February 2024 for abnormal pulmonary function tests  Subjective:   PATIENT ID: [name] Park GENDER: male DOB: July 26, 1950, MRN: [id number]  HPI  Chief Complaint  Patient presents with   Follow-up    No c/o    [name] Park is a 72 year old male, former smoker with history of GERD, HLD, and hypertension who returns to pulmonary clinic for restrictive ventilatory defect on PFTs.   He noted improvement in his breathing with Trelegy 1 puff daily and has continued on this. He has not had any more wheezing. Has not noticed much improvement in regards to exertional dyspnea. He remains very sedentary throughout the day.  HRCT Chest shows no changes in bibasilar scarring since 11/23/2018. There is mild lobular air trapping.   ONO showed he did not qualify for nocturnal oxygen therapy.   Initial OV 08/06/22 PFTs show mild restriction and mild diffusion defect.   He reports exertional dyspnea, cough that is occasionally productive and wheezing since the fall. He tried breztri inhaler 2 puffs twice daily for a couple weeks with out improvement in his symptoms. He reports a fairly sedentary life. He does yard work during the spring, summer and fall. He denies joint pains or rashes.  He quit smoking in 2005. He smoked 30+ years, 1-2 packs. His mother had emphysema and lupus, she was a never smoker. He had second hand smoke exposure in childhood from his father. He retired from the [employer], 28 years. No pets at home. No water or mold damage at home.   Past Medical History:  Diagnosis Date   Acute appendicitis 11/24/2016   COPD (chronic obstructive pulmonary disease)    GERD (gastroesophageal reflux disease)    Hematuria    HLD (hyperlipidemia)    Hypertension    IBS (irritable bowel syndrome)    Pre-diabetes    Vitamin D deficiency      Family History  Problem Relation Age of Onset   Irritable bowel syndrome Mother    Diverticulosis Mother    Lupus Mother     COPD Mother    Hypertension Mother    Lung cancer Father    Other Father        bladder polyps   Stroke Father    Kidney disease Father    Cancer Father    Colon cancer Neg Hx    Esophageal cancer Neg Hx    Rectal cancer Neg Hx    Stomach cancer Neg Hx      Social History   Socioeconomic History   Marital status: Married    Spouse name: Not on file   Number of children: 1   Years of education: Not on file   Highest education level: Not on file  Occupational History   Occupation: retired  Tobacco Use   Smoking status: Former    Packs/day: 1.50    Years: 35.00    Additional pack years: 0.00    Total pack years: 52.50    Types: Cigarettes    Quit date: 02/05/2004    Years since quitting: 18.6   Smokeless tobacco: Never  Vaping Use   Vaping Use: Never used  Substance and Sexual Activity   Alcohol use: Yes    Comment: seldom   Drug use: No   Sexual activity: Not on file  Other Topics Concern   Not on file  Social History Narrative   Not on file   Social Determinants of Health   Financial Resource Strain:  Not on file  Food Insecurity: Not on file  Transportation Needs: Not on file  Physical Activity: Not on file  Stress: Not on file  Social Connections: Not on file  Intimate Partner Violence: Not on file     No Known Allergies   Outpatient Medications Prior to Visit  Medication Sig Dispense Refill   aspirin EC 81 MG tablet Take 81 mg by mouth See admin instructions. Take 1 tablet (81 mg) by mouth 3 times week - Sunday, Wednesday and Friday - with lunch     atorvastatin (LIPITOR) 40 MG tablet TAKE 1 TABLET BY MOUTH  DAILY FOR CHOLESTEROL 90 tablet 3   bisoprolol (ZEBETA) 5 MG tablet TAKE 1 TABLET BY MOUTH DAILY FOR BLOOD PRESSURE 90 tablet 3   buPROPion (WELLBUTRIN XL) 150 MG 24 hr tablet TAKE 1 TABLET BY MOUTH DAILY FOR MOOD, FOCUS, AND CONCENTRATION 90 tablet 3   Cholecalciferol (VITAMIN D-3) 1000 UNITS CAPS Take 5,000 Units by mouth daily.       Fluticasone-Umeclidin-Vilant (TRELEGY ELLIPTA) 100-62.5-25 MCG/ACT AEPB Inhale 1 puff into the lungs daily. 2 each 0   Fluticasone-Umeclidin-Vilant (TRELEGY ELLIPTA) 100-62.5-25 MCG/ACT AEPB Inhale 1 puff into the lungs daily. 60 each 4   furosemide (LASIX) 20 MG tablet Take 20 mg every other day 90 tablet 3   tamsulosin (FLOMAX) 0.4 MG CAPS capsule Take 1 tablet at Bedtime for Prostate 90 capsule 3   Facility-Administered Medications Prior to Visit  Medication Dose Route Frequency Provider Last Rate Last Admin   ipratropium-albuterol (DUONEB) 0.5-2.5 (3) MG/3ML nebulizer solution 3 mL  3 mL Nebulization Once Cranford, Tonya, NP       Review of Systems  Constitutional:  Negative for chills, fever, malaise/fatigue and weight loss.  HENT:  Negative for congestion, sinus pain and sore throat.   Eyes: Negative.   Respiratory:  Positive for shortness of breath. Negative for cough, hemoptysis, sputum production and wheezing.   Cardiovascular:  Negative for chest pain, palpitations, orthopnea, claudication and leg swelling.  Gastrointestinal:  Negative for abdominal pain, heartburn, nausea and vomiting.  Genitourinary: Negative.   Musculoskeletal:  Negative for joint pain and myalgias.  Skin:  Negative for rash.  Neurological:  Negative for weakness.  Endo/Heme/Allergies: Negative.    Objective:   Vitals:   09/23/22 1352  BP: 128/62  Pulse: 62  Temp: 98.2 F (36.8 C)  TempSrc: Oral  SpO2: 97%  Weight: 198 lb 3.2 oz (89.9 kg)  Height: 5\' 9"  (1.753 m)   Physical Exam Constitutional:      General: He is not in acute distress. HENT:     Head: Normocephalic and atraumatic.  Eyes:     Conjunctiva/sclera: Conjunctivae normal.  Cardiovascular:     Rate and Rhythm: Normal rate and regular rhythm.     Pulses: Normal pulses.     Heart sounds: Normal heart sounds. No murmur heard. Pulmonary:     Effort: Pulmonary effort is normal.     Breath sounds: Normal breath sounds.   Musculoskeletal:     Right lower leg: No edema.     Left lower leg: No edema.  Skin:    General: Skin is warm and dry.  Neurological:     General: No focal deficit present.     Mental Status: He is alert.    CBC    Component Value Date/Time   WBC 7.2 09/03/2022 1202   RBC 4.07 (L) 09/03/2022 1202   HGB 12.4 (L) 09/03/2022 1202   HCT 36.6 (  L) 09/03/2022 1202   PLT 247 09/03/2022 1202   MCV 89.9 09/03/2022 1202   MCH 30.5 09/03/2022 1202   MCHC 33.9 09/03/2022 1202   RDW 12.8 09/03/2022 1202   LYMPHSABS 1,800 09/03/2022 1202   MONOABS 0.8 12/12/2019 1119   EOSABS 194 09/03/2022 1202   BASOSABS 43 09/03/2022 1202      Latest Ref Rng & Units 09/03/2022   12:02 PM 06/25/2022   12:05 PM 05/14/2022   12:00 AM  BMP  Glucose 65 - 99 mg/dL 90  88  100   BUN 7 - 25 mg/dL 20  28  19    Creatinine 0.70 - 1.28 mg/dL 1.49  1.75  1.49   BUN/Creat Ratio 6 - 22 (calc) 13  16  13    Sodium 135 - 146 mmol/L 142  140  140   Potassium 3.5 - 5.3 mmol/L 4.7  5.0  5.0   Chloride 98 - 110 mmol/L 104  101  103   CO2 20 - 32 mmol/L 28  27  28    Calcium 8.6 - 10.3 mg/dL 9.5  10.0  9.9    Chest imaging: HRCT Chest 08/25/22 1. Bland appearing bibasilar scarring or atelectasis, unchanged compared to prior examination dated 11/23/2018. No evidence of fibrotic interstitial lung disease. 2. Mild lobular air trapping on expiratory phase imaging, suggestive of small airways disease. 3. Coronary artery disease.  CTA Coronary Scan 07/01/22 Scattered pulmonary parenchymal scarring. Small hiatal hernia. Calcified granulomas.  PFT:    Latest Ref Rng & Units 07/17/2022   12:20 PM  PFT Results  FVC-Pre L 2.73   FVC-Predicted Pre % 65   FVC-Post L 2.74   FVC-Predicted Post % 65   Pre FEV1/FVC % % 77   Post FEV1/FCV % % 79   FEV1-Pre L 2.12   FEV1-Predicted Pre % 70   FEV1-Post L 2.17   DLCO uncorrected ml/min/mmHg 17.75   DLCO UNC% % 71   DLVA Predicted % 103   TLC L 4.97   TLC % Predicted % 72    RV % Predicted % 91     Labs:  Path:  Echo 05/2022: LV EF 60-65%. RV systolic function and size are normal.   Heart Catheterization:     Assessment & Plan:   Restrictive lung disease  Discussion: [name] Park is a 72 year old male, former smoker with history of GERD, HLD, and hypertension who returns to pulmonary clinic for restrictive defect on PFTs   He has mild restriction and diffusion defect on PFTs. HRCT Chest shows no changes in scattered areas of scarring of the bilateral bases since 2020  He is to continue on trelegy ellipta 1 puff daily as he has noted improvement in his cough and wheezing.  We will refer him to pulmonary rehab to work on deconditioning.   He did not have SpO2 desaturation with 3 laps of walking, maintained at 97% throughout at last visit and ONO does not indicate need for supplemental oxygen.   Follow up in 6 months.   Freda Jackson, MD [facility] Office: [phone]     Current Outpatient Medications:    aspirin EC 81 MG tablet, Take 81 mg by mouth See admin instructions. Take 1 tablet (81 mg) by mouth 3 times week - Sunday, Wednesday and Friday - with lunch, Disp: , Rfl:    atorvastatin (LIPITOR) 40 MG tablet, TAKE 1 TABLET BY MOUTH  DAILY FOR CHOLESTEROL, Disp: 90 tablet, Rfl: 3  bisoprolol (ZEBETA) 5 MG tablet, TAKE 1 TABLET BY MOUTH DAILY FOR BLOOD PRESSURE, Disp: 90 tablet, Rfl: 3   buPROPion (WELLBUTRIN XL) 150 MG 24 hr tablet, TAKE 1 TABLET BY MOUTH DAILY FOR MOOD, FOCUS, AND CONCENTRATION, Disp: 90 tablet, Rfl: 3   Cholecalciferol (VITAMIN D-3) 1000 UNITS CAPS, Take 5,000 Units by mouth daily. , Disp: , Rfl:    Fluticasone-Umeclidin-Vilant (TRELEGY ELLIPTA) 100-62.5-25 MCG/ACT AEPB, Inhale 1 puff into the lungs daily., Disp: 2 each, Rfl: 0   Fluticasone-Umeclidin-Vilant (TRELEGY ELLIPTA) 100-62.5-25 MCG/ACT AEPB, Inhale 1 puff into the lungs daily., Disp: 60 each, Rfl: 4   furosemide (LASIX) 20 MG tablet,  Take 20 mg every other day, Disp: 90 tablet, Rfl: 3   tamsulosin (FLOMAX) 0.4 MG CAPS capsule, Take 1 tablet at Bedtime for Prostate, Disp: 90 capsule, Rfl: 3  Current Facility-Administered Medications:    ipratropium-albuterol (DUONEB) 0.5-2.5 (3) MG/3ML nebulizer solution 3 mL, 3 mL, Nebulization, Once, Darrol Jump, NP

## 2022-09-24 ENCOUNTER — Encounter: Payer: Self-pay | Admitting: Pulmonary Disease

## 2022-10-20 DIAGNOSIS — M79645 Pain in left finger(s): Secondary | ICD-10-CM | POA: Diagnosis not present

## 2022-10-21 ENCOUNTER — Encounter (HOSPITAL_COMMUNITY): Payer: Self-pay

## 2022-11-18 ENCOUNTER — Telehealth (HOSPITAL_COMMUNITY): Payer: Self-pay

## 2022-11-18 ENCOUNTER — Encounter (HOSPITAL_COMMUNITY): Payer: Self-pay

## 2022-11-18 NOTE — Telephone Encounter (Signed)
36 visits per lifetime up to 72 visits for pulmonary rehab.

## 2022-11-25 ENCOUNTER — Telehealth (HOSPITAL_COMMUNITY): Payer: Self-pay

## 2022-11-25 NOTE — Telephone Encounter (Signed)
Pt called to confirm a for pulmonary rehab orientation on 6/5. Pt did not answer. VM left.

## 2022-11-26 ENCOUNTER — Encounter (HOSPITAL_COMMUNITY): Payer: Self-pay

## 2022-11-26 ENCOUNTER — Encounter (HOSPITAL_COMMUNITY)
Admission: RE | Admit: 2022-11-26 | Discharge: 2022-11-26 | Disposition: A | Discharge status: Home / Self Care | Payer: Medicare Other | Source: Ambulatory Visit | Attending: Pulmonary Disease | Admitting: Pulmonary Disease

## 2022-11-26 VITALS — BP 140/62 | HR 77 | Wt 186.9 lb

## 2022-11-26 DIAGNOSIS — J984 Other disorders of lung: Secondary | ICD-10-CM | POA: Insufficient documentation

## 2022-11-26 NOTE — Progress Notes (Signed)
[name] Park 72 y.o. male Pulmonary Rehab Orientation Note This patient who was referred to Pulmonary Rehab by Dr. Francine Graven with the diagnosis of restrictive lung disease arrived today in Cardiac and Pulmonary Rehab. He arrived ambulatory with normal gait. He does not carry portable oxygen.  Per patient, Legion does not use oxygen. Color good, skin warm and dry. Patient is oriented to time and place. Patient's medical history, psychosocial health, and medications reviewed. Psychosocial assessment reveals patient lives with spouse. [name] Park is currently retired. Patient hobbies include  collecting sports cards, yard work, and fishing. Patient reports his stress level is low. Patient does not exhibit signs of depression. PHQ2/9 score 2/4. [name] Park shows good  coping skills with positive outlook on life. Offered emotional support and reassurance. Will continue to monitor. Physical assessment performed by Nurse pick: Karlene Lineman RN. Please see their orientation physical assessment note. [name] reports he  does take medications as prescribed. Patient states he  follows a regular  diet. The patient reports no specific efforts to gain or lose weight.. Patient's weight will be monitored closely. Demonstration and practice of PLB using pulse oximeter. [name] Park able to return demonstration satisfactorily. Safety and hand hygiene in the exercise area reviewed with patient. Campbell voices understanding of the information reviewed. Department expectations discussed with patient and achievable goals were set. The patient shows enthusiasm about attending the program and we look forward to working with [name] Park. [name] Park completed a 6 min walk test today and is scheduled to begin exercise on 6/11 @1 :15.   1015-1130 [name] Park, BSRT

## 2022-11-26 NOTE — Progress Notes (Signed)
Pulmonary Rehab Orientation Physical Assessment Note  Physical assessment reveals  Pt is alert and oriented x 3.  Heart rate is normal, breath sounds ausculted by Durel Salts RT. Reports non-productive cough. Bowel sounds present.  Pt denies  abdominal discomfort, nausea, vomiting or diarrhea. Grip strength strong with the left slightly weaker than the right. Distal pulses palpable with 1+ pitting edema in both ankles with the right more than the left.  This is chronic for this pt.  Advised on lower sodium diet along with elevation of feet when sitting and wearing compression socks. Verbalized understanding. Alanson Aly, BSN Cardiac and Pulmonary Rehab Nurse Navigator '

## 2022-11-26 NOTE — Progress Notes (Signed)
Pulmonary Individual Treatment Plan  Patient Details  Name: [name] Park MRN: [id number] Date of Birth: 16-Sep-1950 Referring Provider:   Doristine Devoid Pulmonary Rehab Walk Test from 11/26/2022 in [facility]  Referring Provider [name]       Initial Encounter Date:  Flowsheet Row Pulmonary Rehab Walk Test from 11/26/2022 in [facility] Heart[facility], & Lung [facility]  Date 11/26/22       Visit Diagnosis: Restrictive lung disease  Patient's Home Medications on Admission:   Current Outpatient Medications:    aspirin EC 81 MG tablet, Take 81 mg by mouth See admin instructions. Take 1 tablet (81 mg) by mouth 3 times week - Sunday, Wednesday and Friday - with lunch, Disp: , Rfl:    atorvastatin (LIPITOR) 40 MG tablet, TAKE 1 TABLET BY MOUTH  DAILY FOR CHOLESTEROL, Disp: 90 tablet, Rfl: 3   bisoprolol (ZEBETA) 5 MG tablet, TAKE 1 TABLET BY MOUTH DAILY FOR BLOOD PRESSURE, Disp: 90 tablet, Rfl: 3   buPROPion (WELLBUTRIN XL) 150 MG 24 hr tablet, TAKE 1 TABLET BY MOUTH DAILY FOR MOOD, FOCUS, AND CONCENTRATION, Disp: 90 tablet, Rfl: 3   Cholecalciferol (VITAMIN D-3) 1000 UNITS CAPS, Take 5,000 Units by mouth daily. , Disp: , Rfl:    Fluticasone-Umeclidin-Vilant (TRELEGY ELLIPTA) 100-62.5-25 MCG/ACT AEPB, Inhale 1 puff into the lungs daily., Disp: 2 each, Rfl: 0   Fluticasone-Umeclidin-Vilant (TRELEGY ELLIPTA) 100-62.5-25 MCG/ACT AEPB, Inhale 1 puff into the lungs daily., Disp: 60 each, Rfl: 4   furosemide (LASIX) 20 MG tablet, Take 20 mg every other day, Disp: 90 tablet, Rfl: 3   ketorolac (TORADOL) 10 MG tablet, Take 50 mg by mouth every 6 (six) hours as needed for moderate pain., Disp: , Rfl:    tamsulosin (FLOMAX) 0.4 MG CAPS capsule, Take 1 tablet at Bedtime for Prostate, Disp: 90 capsule, Rfl: 3  Current Facility-Administered Medications:    ipratropium-albuterol (DUONEB) 0.5-2.5 (3) MG/3ML nebulizer  solution 3 mL, 3 mL, Nebulization, Once, Cranford, Archie Patten, NP  Past Medical History: Past Medical History:  Diagnosis Date   Acute appendicitis 11/24/2016   COPD (chronic obstructive pulmonary disease) (HCC)    GERD (gastroesophageal reflux disease)    Hematuria    HLD (hyperlipidemia)    Hypertension    IBS (irritable bowel syndrome)    Pre-diabetes    Vitamin D deficiency     Tobacco Use: Social History   Tobacco Use  Smoking Status Former   Packs/day: 1.50   Years: 35.00   Additional pack years: 0.00   Total pack years: 52.50   Types: Cigarettes   Quit date: 02/05/2004   Years since quitting: 18.8  Smokeless Tobacco Never    Labs: Review Flowsheet  More data exists      Latest Ref Rng & Units 05/14/2021 08/20/2021 01/30/2022 05/14/2022 09/03/2022  Labs for ITP Cardiac and Pulmonary Rehab  Cholestrol <200 mg/dL 829  562  130  865  784   LDL (calc) mg/dL (calc) 63  72  78  72  48   HDL-C > OR = 40 mg/dL 64  63  57  61  65   Trlycerides <150 mg/dL 80  91  696  295  74   Hemoglobin A1c <5.7 % of total Hgb 5.5  - 5.5  5.6  -    Capillary Blood Glucose: No results found for: "GLUCAP"   Pulmonary Assessment Scores:  Pulmonary Assessment Scores  Row Name 11/26/22 1105         ADL UCSD   ADL Phase Entry     SOB Score total 47       CAT Score   CAT Score 20       mMRC Score   mMRC Score 1             UCSD: Self-administered rating of dyspnea associated with activities of daily living (ADLs) 6-point scale (0 = "not at all" to 5 = "maximal or unable to do because of breathlessness")  Scoring Scores range from 0 to 120.  Minimally important difference is 5 units  CAT: CAT can identify the health impairment of COPD patients and is better correlated with disease progression.  CAT has a scoring range of zero to 40. The CAT score is classified into four groups of low (less than 10), medium (10 - 20), high (21-30) and very high (31-40) based on the impact  level of disease on health status. A CAT score over 10 suggests significant symptoms.  A worsening CAT score could be explained by an exacerbation, poor medication adherence, poor inhaler technique, or progression of COPD or comorbid conditions.  CAT MCID is 2 points  mMRC: mMRC (Modified Medical Research Council) Dyspnea Scale is used to assess the degree of baseline functional disability in patients of respiratory disease due to dyspnea. No minimal important difference is established. A decrease in score of 1 point or greater is considered a positive change.   Pulmonary Function Assessment:  Pulmonary Function Assessment - 11/26/22 1045       Breath   Bilateral Breath Sounds Clear    Shortness of Breath Yes;Limiting activity             Exercise Target Goals: Exercise Program Goal: Individual exercise prescription set using results from initial 6 min walk test and THRR while considering  patient's activity barriers and safety.   Exercise Prescription Goal: Initial exercise prescription builds to 30-45 minutes a day of aerobic activity, 2-3 days per week.  Home exercise guidelines will be given to patient during program as part of exercise prescription that the participant will acknowledge.  Activity Barriers & Risk Stratification:  Activity Barriers & Cardiac Risk Stratification - 11/26/22 1043       Activity Barriers & Cardiac Risk Stratification   Activity Barriers Deconditioning;Muscular Weakness;Shortness of Breath;Arthritis    Cardiac Risk Stratification Moderate             6 Minute Walk:  6 Minute Walk     Row Name 11/26/22 1146         6 Minute Walk   Phase Initial     Distance 1205 feet     Walk Time 6 minutes     # of Rest Breaks 0     MPH 2.28     METS 2.61     RPE 11     Perceived Dyspnea  1     VO2 Peak 9.12     Symptoms No     Resting HR 55 bpm     Resting BP 140/62     Resting Oxygen Saturation  96 %     Exercise Oxygen Saturation   during 6 min walk 94 %     Max Ex. HR 100 bpm     Max Ex. BP 132/62     2 Minute Post BP 118/60       Interval HR   1 Minute HR 94  2 Minute HR 100     3 Minute HR 100     4 Minute HR 97     5 Minute HR 95     6 Minute HR 99     2 Minute Post HR 61     Interval Heart Rate? Yes       Interval Oxygen   Interval Oxygen? Yes     Baseline Oxygen Saturation % 96 %     1 Minute Oxygen Saturation % 100 %     1 Minute Liters of Oxygen 0 L     2 Minute Oxygen Saturation % 97 %     2 Minute Liters of Oxygen 0 L     3 Minute Oxygen Saturation % 91 %     3 Minute Liters of Oxygen 0 L     4 Minute Oxygen Saturation % 97 %     4 Minute Liters of Oxygen 0 L     5 Minute Oxygen Saturation % 98 %     5 Minute Liters of Oxygen 0 L     6 Minute Oxygen Saturation % 94 %     6 Minute Liters of Oxygen 0 L     2 Minute Post Oxygen Saturation % 100 %     2 Minute Post Liters of Oxygen 0 L              Oxygen Initial Assessment:  Oxygen Initial Assessment - 11/26/22 1045       Home Oxygen   Home Oxygen Device None    Sleep Oxygen Prescription None    Home Exercise Oxygen Prescription None    Home Resting Oxygen Prescription None      Initial 6 min Walk   Oxygen Used None      Program Oxygen Prescription   Program Oxygen Prescription None      Intervention   Short Term Goals To learn and understand importance of maintaining oxygen saturations>88%;To learn and demonstrate proper use of respiratory medications;To learn and understand importance of monitoring SPO2 with pulse oximeter and demonstrate accurate use of the pulse oximeter.;To learn and demonstrate proper pursed lip breathing techniques or other breathing techniques. ;To learn and exhibit compliance with exercise, home and travel O2 prescription    Long  Term Goals Exhibits compliance with exercise, home  and travel O2 prescription;Maintenance of O2 saturations>88%;Compliance with respiratory medication;Verbalizes importance  of monitoring SPO2 with pulse oximeter and return demonstration;Exhibits proper breathing techniques, such as pursed lip breathing or other method taught during program session;Demonstrates proper use of MDI's             Oxygen Re-Evaluation:   Oxygen Discharge (Final Oxygen Re-Evaluation):   Initial Exercise Prescription:  Initial Exercise Prescription - 11/26/22 1100       Date of Initial Exercise RX and Referring Provider   Date 11/26/22    Referring Provider [name]    Expected Discharge Date 02/19/23      Treadmill   MPH 2.5    Grade 0    Minutes 15      Rower   Level 1    Watts 16    Minutes 15      Prescription Details   Frequency (times per week) 2    Duration Progress to 30 minutes of continuous aerobic without signs/symptoms of physical distress      Intensity   THRR 40-80% of Max Heartrate 59-118    Ratings of Perceived Exertion 11-13  Perceived Dyspnea 0-4      Progression   Progression Continue progressive overload as per policy without signs/symptoms or physical distress.      Resistance Training   Training Prescription Yes    Weight blue bands    Reps 10-15             Perform Capillary Blood Glucose checks as needed.  Exercise Prescription Changes:   Exercise Comments:   Exercise Goals and Review:   Exercise Goals     Row Name 11/26/22 1044             Exercise Goals   Increase Physical Activity Yes       Intervention Provide advice, education, support and counseling about physical activity/exercise needs.;Develop an individualized exercise prescription for aerobic and resistive training based on initial evaluation findings, risk stratification, comorbidities and participant's personal goals.       Expected Outcomes Short Term: Attend rehab on a regular basis to increase amount of physical activity.;Long Term: Exercising regularly at least 3-5 days a week.;Long Term: Add in home exercise to make exercise part of routine  and to increase amount of physical activity.       Increase Strength and Stamina Yes       Intervention Develop an individualized exercise prescription for aerobic and resistive training based on initial evaluation findings, risk stratification, comorbidities and participant's personal goals.;Provide advice, education, support and counseling about physical activity/exercise needs.       Expected Outcomes Short Term: Increase workloads from initial exercise prescription for resistance, speed, and METs.;Short Term: Perform resistance training exercises routinely during rehab and add in resistance training at home;Long Term: Improve cardiorespiratory fitness, muscular endurance and strength as measured by increased METs and functional capacity ( )       Able to understand and use rate of perceived exertion (RPE) scale Yes       Intervention Provide education and explanation on how to use RPE scale       Expected Outcomes Short Term: Able to use RPE daily in rehab to express subjective intensity level;Long Term:  Able to use RPE to guide intensity level when exercising independently       Able to understand and use Dyspnea scale Yes       Intervention Provide education and explanation on how to use Dyspnea scale       Expected Outcomes Short Term: Able to use Dyspnea scale daily in rehab to express subjective sense of shortness of breath during exertion;Long Term: Able to use Dyspnea scale to guide intensity level when exercising independently       Knowledge and understanding of Target Heart Rate Range (THRR) Yes       Intervention Provide education and explanation of THRR including how the numbers were predicted and where they are located for reference       Expected Outcomes Short Term: Able to state/look up THRR;Long Term: Able to use THRR to govern intensity when exercising independently;Short Term: Able to use daily as guideline for intensity in rehab       Understanding of Exercise Prescription  Yes       Intervention Provide education, explanation, and written materials on patient's individual exercise prescription       Expected Outcomes Short Term: Able to explain program exercise prescription;Long Term: Able to explain home exercise prescription to exercise independently                Exercise Goals Re-Evaluation :   Discharge  Exercise Prescription (Final Exercise Prescription Changes):   Nutrition:  Target Goals: Understanding of nutrition guidelines, daily intake of sodium 1500mg , cholesterol 200mg , calories 30% from fat and 7% or less from saturated fats, daily to have 5 or more servings of fruits and vegetables.  Biometrics:  Pre Biometrics - 11/26/22 1224       Pre Biometrics   Grip Strength 35 kg              Nutrition Therapy Plan and Nutrition Goals:   Nutrition Assessments:  MEDIFICTS Score Key: ?70 Need to make dietary changes  40-70 Heart Healthy Diet ? 40 Therapeutic Level Cholesterol Diet   Picture Your Plate Scores: <64 Unhealthy dietary pattern with much room for improvement. 41-50 Dietary pattern unlikely to meet recommendations for good health and room for improvement. 51-60 More healthful dietary pattern, with some room for improvement.  >60 Healthy dietary pattern, although there may be some specific behaviors that could be improved.    Nutrition Goals Re-Evaluation:   Nutrition Goals Discharge (Final Nutrition Goals Re-Evaluation):   Psychosocial: Target Goals: Acknowledge presence or absence of significant depression and/or stress, maximize coping skills, provide positive support system. Participant is able to verbalize types and ability to use techniques and skills needed for reducing stress and depression.  Initial Review & Psychosocial Screening:  Initial Psych Review & Screening - 11/26/22 1040       Initial Review   Current issues with None Identified      Family Dynamics   Good Support System? Yes       Barriers   Psychosocial barriers to participate in program There are no identifiable barriers or psychosocial needs.      Screening Interventions   Interventions Encouraged to exercise             Quality of Life Scores:  Scores of 19 and below usually indicate a poorer quality of life in these areas.  A difference of  2-3 points is a clinically meaningful difference.  A difference of 2-3 points in the total score of the Quality of Life Index has been associated with significant improvement in overall quality of life, self-image, physical symptoms, and general health in studies assessing change in quality of life.  PHQ-9: Review Flowsheet  More data exists      11/26/2022 09/03/2022 01/29/2022 08/20/2021 11/27/2020  Depression screen PHQ 2/9  Decreased Interest 1 0 0 0 0  Down, Depressed, Hopeless 1 0 0 0 0  PHQ - 2 Score 2 0 0 0 0  Altered sleeping 0 - - - -  Tired, decreased energy 0 - - - -  Change in appetite 0 - - - -  Feeling bad or failure about yourself  0 - - - -  Trouble concentrating 1 - - - -  Moving slowly or fidgety/restless 1 - - - -  PHQ-9 Score 4 - - - -  Difficult doing work/chores Somewhat difficult - - - -   Interpretation of Total Score  Total Score Depression Severity:  1-4 = Minimal depression, 5-9 = Mild depression, 10-14 = Moderate depression, 15-19 = Moderately severe depression, 20-27 = Severe depression   Psychosocial Evaluation and Intervention:  Psychosocial Evaluation - 11/26/22 1041       Psychosocial Evaluation & Interventions   Interventions Encouraged to exercise with the program and follow exercise prescription    Comments Alassane denies any psychosocial barriers or concerns at this time.    Expected Outcomes For Oniel to  participate in PR free of any psychosocial barriers or concerns    Continue Psychosocial Services  No Follow up required             Psychosocial Re-Evaluation:   Psychosocial Discharge (Final Psychosocial  Re-Evaluation):   Education: Education Goals: Education classes will be provided on a weekly basis, covering required topics. Participant will state understanding/return demonstration of topics presented.  Learning Barriers/Preferences:  Learning Barriers/Preferences - 11/26/22 1042       Learning Barriers/Preferences   Learning Barriers None    Learning Preferences Group Instruction;Written Material             Education Topics: Introduction to Pulmonary Rehab Group instruction provided by PowerPoint, verbal discussion, and written material to support subject matter. Instructor reviews what Pulmonary Rehab is, the purpose of the program, and how patients are referred.     Know Your Numbers Group instruction that is supported by a PowerPoint presentation. Instructor discusses importance of knowing and understanding resting, exercise, and post-exercise oxygen saturation, heart rate, and blood pressure. Oxygen saturation, heart rate, blood pressure, rating of perceived exertion, and dyspnea are reviewed along with a normal range for these values.    Exercise for the Pulmonary Patient Group instruction that is supported by a PowerPoint presentation. Instructor discusses benefits of exercise, core components of exercise, frequency, duration, and intensity of an exercise routine, importance of utilizing pulse oximetry during exercise, safety while exercising, and options of places to exercise outside of rehab.       MET Level  Group instruction provided by PowerPoint, verbal discussion, and written material to support subject matter. Instructor reviews what METs are and how to increase METs.    Pulmonary Medications Verbally interactive group education provided by instructor with focus on inhaled medications and proper administration.   Anatomy and Physiology of the Respiratory System Group instruction provided by PowerPoint, verbal discussion, and written material to support  subject matter. Instructor reviews respiratory cycle and anatomical components of the respiratory system and their functions. Instructor also reviews differences in obstructive and restrictive respiratory diseases with examples of each.    Oxygen Safety Group instruction provided by PowerPoint, verbal discussion, and written material to support subject matter. There is an overview of "What is Oxygen" and "Why do we need it".  Instructor also reviews how to create a safe environment for oxygen use, the importance of using oxygen as prescribed, and the risks of noncompliance. There is a brief discussion on traveling with oxygen and resources the patient may utilize.   Oxygen Use Group instruction provided by PowerPoint, verbal discussion, and written material to discuss how supplemental oxygen is prescribed and different types of oxygen supply systems. Resources for more information are provided.    Breathing Techniques Group instruction that is supported by demonstration and informational handouts. Instructor discusses the benefits of pursed lip and diaphragmatic breathing and detailed demonstration on how to perform both.     Risk Factor Reduction Group instruction that is supported by a PowerPoint presentation. Instructor discusses the definition of a risk factor, different risk factors for pulmonary disease, and how the heart and lungs work together.   MD Day A group question and answer session with a medical doctor that allows participants to ask questions that relate to their pulmonary disease state.   Nutrition for the Pulmonary Patient Group instruction provided by PowerPoint slides, verbal discussion, and written materials to support subject matter. The instructor gives an explanation and review of healthy diet recommendations, which includes  a discussion on weight management, recommendations for fruit and vegetable consumption, as well as protein, fluid, caffeine, fiber, sodium,  sugar, and alcohol. Tips for eating when patients are short of breath are discussed.    Other Education Group or individual verbal, written, or video instructions that support the educational goals of the pulmonary rehab program.    Knowledge Questionnaire Score:  Knowledge Questionnaire Score - 11/26/22 1105       Knowledge Questionnaire Score   Pre Score 14/18             Core Components/Risk Factors/Patient Goals at Admission:  Personal Goals and Risk Factors at Admission - 11/26/22 1042       Core Components/Risk Factors/Patient Goals on Admission    Weight Management Weight Loss    Improve shortness of breath with ADL's Yes    Intervention Provide education, individualized exercise plan and daily activity instruction to help decrease symptoms of SOB with activities of daily living.    Expected Outcomes Short Term: Improve cardiorespiratory fitness to achieve a reduction of symptoms when performing ADLs             Core Components/Risk Factors/Patient Goals Review:    Core Components/Risk Factors/Patient Goals at Discharge (Final Review):    ITP Comments:   Comments: Dr. Mechele Collin is Medical Director for Pulmonary Rehab at [facility].

## 2022-12-02 ENCOUNTER — Encounter (HOSPITAL_COMMUNITY)
Admission: RE | Admit: 2022-12-02 | Discharge: 2022-12-02 | Disposition: A | Discharge status: Home / Self Care | Payer: Medicare Other | Source: Ambulatory Visit | Attending: Pulmonary Disease | Admitting: Pulmonary Disease

## 2022-12-02 VITALS — Wt 185.2 lb

## 2022-12-02 DIAGNOSIS — J984 Other disorders of lung: Secondary | ICD-10-CM | POA: Diagnosis not present

## 2022-12-02 NOTE — Progress Notes (Signed)
Daily Session Note  Patient Details  Name: [name] Park MRN: [id number] Date of Birth: 03/26/1951 Referring Provider:   Doristine Devoid Pulmonary Rehab Walk Test from 11/26/2022 in [facility]  Referring Provider [name]       Encounter Date: 12/02/2022  Check In:  Session Check In - 12/02/22 1409       Check-In   Supervising physician immediately available to respond to emergencies [facility] MD immediately available    Physician(s) Jari Favre, PA    Location [facility]    Staff Present Raford Pitcher, MS, ACSM-CEP, Exercise Physiologist;Casey Robina Ade, RN, MSN;Mary Gerre Scull, RN, BSN;Randi Reeve BS, ACSM-CEP, Exercise Physiologist    Virtual Visit No    Medication changes reported     No    Fall or balance concerns reported    No    Tobacco Cessation No Change    Warm-up and Cool-down Performed as group-led instruction    Resistance Training Performed No    VAD Patient? No    PAD/SET Patient? No      Pain Assessment   Currently in Pain? No/denies    Multiple Pain Sites No             Capillary Blood Glucose: No results found for this or any previous visit (from the past 24 hour(s)).   Exercise Prescription Changes - 12/02/22 1500       Response to Exercise   Blood Pressure (Admit) 120/50    Blood Pressure (Exercise) 126/60    Blood Pressure (Exit) 108/60    Heart Rate (Admit) 60 bpm    Heart Rate (Exercise) 112 bpm    Heart Rate (Exit) 78 bpm    Oxygen Saturation (Admit) 97 %    Oxygen Saturation (Exercise) 97 %    Oxygen Saturation (Exit) 96 %    Rating of Perceived Exertion (Exercise) 15    Perceived Dyspnea (Exercise) 3    Duration Progress to 30 minutes of  aerobic without signs/symptoms of physical distress    Intensity THRR unchanged      Progression   Progression Continue to progress workloads to maintain intensity without signs/symptoms of physical distress.       Resistance Training   Training Prescription Yes    Weight blue bands    Reps 10-15    Time 10 Minutes      Treadmill   MPH 2.5    Grade 0    Minutes 15    METs 2.91      Rower   Level 1    Watts 13    Minutes 15             Social History   Tobacco Use  Smoking Status Former   Packs/day: 1.50   Years: 35.00   Additional pack years: 0.00   Total pack years: 52.50   Types: Cigarettes   Quit date: 02/05/2004   Years since quitting: 18.8  Smokeless Tobacco Never    Goals Met:  Proper associated with RPD/PD & O2 Sat Exercise tolerated well No report of concerns or symptoms today Strength training completed today  Goals Unmet:  Not Applicable  Comments: Service time is from 1307 to 1445.    Dr. Mechele Collin is Medical Director for Pulmonary Rehab at [facility].

## 2022-12-03 NOTE — Progress Notes (Signed)
Pulmonary Individual Treatment Plan  Patient Details  Name: [name] Park MRN: [id number] Date of Birth: 16-Sep-1950 Referring Provider:   Doristine Devoid Pulmonary Rehab Walk Test from 11/26/2022 in [facility], [facility], & [facility]  Referring Provider [name]       Initial Encounter Date:  Flowsheet Row Pulmonary Rehab Walk Test from 11/26/2022 in [facility] for Heart, Vascular, & Lung Health  Date 11/26/22       Visit Diagnosis: Restrictive lung disease  Patient's Home Medications on Admission:   Current Outpatient Medications:    aspirin EC 81 MG tablet, Take 81 mg by mouth See admin instructions. Take 1 tablet (81 mg) by mouth 3 times week - Sunday, Wednesday and Friday - with lunch, Disp: , Rfl:    atorvastatin (LIPITOR) 40 MG tablet, TAKE 1 TABLET BY MOUTH  DAILY FOR CHOLESTEROL, Disp: 90 tablet, Rfl: 3   bisoprolol (ZEBETA) 5 MG tablet, TAKE 1 TABLET BY MOUTH DAILY FOR BLOOD PRESSURE, Disp: 90 tablet, Rfl: 3   buPROPion (WELLBUTRIN XL) 150 MG 24 hr tablet, TAKE 1 TABLET BY MOUTH DAILY FOR MOOD, FOCUS, AND CONCENTRATION, Disp: 90 tablet, Rfl: 3   Cholecalciferol (VITAMIN D-3) 1000 UNITS CAPS, Take 5,000 Units by mouth daily. , Disp: , Rfl:    Fluticasone-Umeclidin-Vilant (TRELEGY ELLIPTA) 100-62.5-25 MCG/ACT AEPB, Inhale 1 puff into the lungs daily., Disp: 2 each, Rfl: 0   Fluticasone-Umeclidin-Vilant (TRELEGY ELLIPTA) 100-62.5-25 MCG/ACT AEPB, Inhale 1 puff into the lungs daily., Disp: 60 each, Rfl: 4   furosemide (LASIX) 20 MG tablet, Take 20 mg every other day, Disp: 90 tablet, Rfl: 3   ketorolac (TORADOL) 10 MG tablet, Take 50 mg by mouth every 6 (six) hours as needed for moderate pain., Disp: , Rfl:    tamsulosin (FLOMAX) 0.4 MG CAPS capsule, Take 1 tablet at Bedtime for Prostate, Disp: 90 capsule, Rfl: 3  Current Facility-Administered Medications:    ipratropium-albuterol (DUONEB) 0.5-2.5 (3) MG/3ML nebulizer  solution 3 mL, 3 mL, Nebulization, Once, [place], Archie Patten, NP  Past Medical History: Past Medical History:  Diagnosis Date   Acute appendicitis 11/24/2016   COPD (chronic obstructive pulmonary disease) (HCC)    GERD (gastroesophageal reflux disease)    Hematuria    HLD (hyperlipidemia)    Hypertension    IBS (irritable bowel syndrome)    Pre-diabetes    Vitamin D deficiency     Tobacco Use: Social History   Tobacco Use  Smoking Status Former   Packs/day: 1.50   Years: 35.00   Additional pack years: 0.00   Total pack years: 52.50   Types: Cigarettes   Quit date: 02/05/2004   Years since quitting: 18.8  Smokeless Tobacco Never    Labs: Review Flowsheet  More data exists      Latest Ref Rng & Units 05/14/2021 08/20/2021 01/30/2022 05/14/2022 09/03/2022  Labs for ITP Cardiac and Pulmonary Rehab  Cholestrol <200 mg/dL 829  562  130  865  784   LDL (calc) mg/dL (calc) 63  72  78  72  48   HDL-C > OR = 40 mg/dL 64  63  57  61  65   Trlycerides <150 mg/dL 80  91  696  295  74   Hemoglobin A1c <5.7 % of total Hgb 5.5  - 5.5  5.6  -    Capillary Blood Glucose: No results found for: "GLUCAP"   Pulmonary Assessment Scores:  Pulmonary Assessment Scores  Row Name 11/26/22 1105         ADL UCSD   ADL Phase Entry     SOB Score total 47       CAT Score   CAT Score 20       mMRC Score   mMRC Score 1             UCSD: Self-administered rating of dyspnea associated with activities of daily living (ADLs) 6-point scale (0 = "not at all" to 5 = "maximal or unable to do because of breathlessness")  Scoring Scores range from 0 to 120.  Minimally important difference is 5 units  CAT: CAT can identify the health impairment of COPD patients and is better correlated with disease progression.  CAT has a scoring range of zero to 40. The CAT score is classified into four groups of low (less than 10), medium (10 - 20), high (21-30) and very high (31-40) based on the impact  level of disease on health status. A CAT score over 10 suggests significant symptoms.  A worsening CAT score could be explained by an exacerbation, poor medication adherence, poor inhaler technique, or progression of COPD or comorbid conditions.  CAT MCID is 2 points  mMRC: mMRC (Modified Medical Research Council) Dyspnea Scale is used to assess the degree of baseline functional disability in patients of respiratory disease due to dyspnea. No minimal important difference is established. A decrease in score of 1 point or greater is considered a positive change.   Pulmonary Function Assessment:  Pulmonary Function Assessment - 11/26/22 1045       Breath   Bilateral Breath Sounds Clear    Shortness of Breath Yes;Limiting activity             Exercise Target Goals: Exercise Program Goal: Individual exercise prescription set using results from initial 6 min walk test and THRR while considering  patient's activity barriers and safety.   Exercise Prescription Goal: Initial exercise prescription builds to 30-45 minutes a day of aerobic activity, 2-3 days per week.  Home exercise guidelines will be given to patient during program as part of exercise prescription that the participant will acknowledge.  Activity Barriers & Risk Stratification:  Activity Barriers & Cardiac Risk Stratification - 11/26/22 1043       Activity Barriers & Cardiac Risk Stratification   Activity Barriers Deconditioning;Muscular Weakness;Shortness of Breath;Arthritis    Cardiac Risk Stratification Moderate             6 Minute Walk:  6 Minute Walk     Row Name 11/26/22 1146         6 Minute Walk   Phase Initial     Distance 1205 feet     Walk Time 6 minutes     # of Rest Breaks 0     MPH 2.28     METS 2.61     RPE 11     Perceived Dyspnea  1     VO2 Peak 9.12     Symptoms No     Resting HR 55 bpm     Resting BP 140/62     Resting Oxygen Saturation  96 %     Exercise Oxygen Saturation   during 6 min walk 94 %     Max Ex. HR 100 bpm     Max Ex. BP 132/62     2 Minute Post BP 118/60       Interval HR   1 Minute HR 94  2 Minute HR 100     3 Minute HR 100     4 Minute HR 97     5 Minute HR 95     6 Minute HR 99     2 Minute Post HR 61     Interval Heart Rate? Yes       Interval Oxygen   Interval Oxygen? Yes     Baseline Oxygen Saturation % 96 %     1 Minute Oxygen Saturation % 100 %     1 Minute Liters of Oxygen 0 L     2 Minute Oxygen Saturation % 97 %     2 Minute Liters of Oxygen 0 L     3 Minute Oxygen Saturation % 91 %     3 Minute Liters of Oxygen 0 L     4 Minute Oxygen Saturation % 97 %     4 Minute Liters of Oxygen 0 L     5 Minute Oxygen Saturation % 98 %     5 Minute Liters of Oxygen 0 L     6 Minute Oxygen Saturation % 94 %     6 Minute Liters of Oxygen 0 L     2 Minute Post Oxygen Saturation % 100 %     2 Minute Post Liters of Oxygen 0 L              Oxygen Initial Assessment:  Oxygen Initial Assessment - 11/26/22 1045       Home Oxygen   Home Oxygen Device None    Sleep Oxygen Prescription None    Home Exercise Oxygen Prescription None    Home Resting Oxygen Prescription None      Initial 6 min Walk   Oxygen Used None      Program Oxygen Prescription   Program Oxygen Prescription None      Intervention   Short Term Goals To learn and understand importance of maintaining oxygen saturations>88%;To learn and demonstrate proper use of respiratory medications;To learn and understand importance of monitoring SPO2 with pulse oximeter and demonstrate accurate use of the pulse oximeter.;To learn and demonstrate proper pursed lip breathing techniques or other breathing techniques. ;To learn and exhibit compliance with exercise, home and travel O2 prescription    Long  Term Goals Exhibits compliance with exercise, home  and travel O2 prescription;Maintenance of O2 saturations>88%;Compliance with respiratory medication;Verbalizes importance  of monitoring SPO2 with pulse oximeter and return demonstration;Exhibits proper breathing techniques, such as pursed lip breathing or other method taught during program session;Demonstrates proper use of MDI's             Oxygen Re-Evaluation:  Oxygen Re-Evaluation     Row Name 11/26/22 1526             Program Oxygen Prescription   Program Oxygen Prescription None         Home Oxygen   Home Oxygen Device None       Sleep Oxygen Prescription None       Home Exercise Oxygen Prescription None       Home Resting Oxygen Prescription None         Goals/Expected Outcomes   Short Term Goals To learn and understand importance of maintaining oxygen saturations>88%;To learn and demonstrate proper use of respiratory medications;To learn and understand importance of monitoring SPO2 with pulse oximeter and demonstrate accurate use of the pulse oximeter.;To learn and demonstrate proper pursed lip breathing techniques or other breathing techniques. ;To  learn and exhibit compliance with exercise, home and travel O2 prescription       Long  Term Goals Exhibits compliance with exercise, home  and travel O2 prescription;Maintenance of O2 saturations>88%;Compliance with respiratory medication;Verbalizes importance of monitoring SPO2 with pulse oximeter and return demonstration;Exhibits proper breathing techniques, such as pursed lip breathing or other method taught during program session;Demonstrates proper use of MDI's       Goals/Expected Outcomes Compliance and understanding of oxygen saturations monitoring and breathing techniques to decrease shortness of breath.                Oxygen Discharge (Final Oxygen Re-Evaluation):  Oxygen Re-Evaluation - 11/26/22 1526       Program Oxygen Prescription   Program Oxygen Prescription None      Home Oxygen   Home Oxygen Device None    Sleep Oxygen Prescription None    Home Exercise Oxygen Prescription None    Home Resting Oxygen Prescription  None      Goals/Expected Outcomes   Short Term Goals To learn and understand importance of maintaining oxygen saturations>88%;To learn and demonstrate proper use of respiratory medications;To learn and understand importance of monitoring SPO2 with pulse oximeter and demonstrate accurate use of the pulse oximeter.;To learn and demonstrate proper pursed lip breathing techniques or other breathing techniques. ;To learn and exhibit compliance with exercise, home and travel O2 prescription    Long  Term Goals Exhibits compliance with exercise, home  and travel O2 prescription;Maintenance of O2 saturations>88%;Compliance with respiratory medication;Verbalizes importance of monitoring SPO2 with pulse oximeter and return demonstration;Exhibits proper breathing techniques, such as pursed lip breathing or other method taught during program session;Demonstrates proper use of MDI's    Goals/Expected Outcomes Compliance and understanding of oxygen saturations monitoring and breathing techniques to decrease shortness of breath.             Initial Exercise Prescription:  Initial Exercise Prescription - 11/26/22 1100       Date of Initial Exercise RX and Referring Provider   Date 11/26/22    Referring Provider [name]    Expected Discharge Date 02/19/23      Treadmill   MPH 2.5    Grade 0    Minutes 15      Rower   Level 1    Watts 16    Minutes 15      Prescription Details   Frequency (times per week) 2    Duration Progress to 30 minutes of continuous aerobic without signs/symptoms of physical distress      Intensity   THRR 40-80% of Max Heartrate 59-118    Ratings of Perceived Exertion 11-13    Perceived Dyspnea 0-4      Progression   Progression Continue progressive overload as per policy without signs/symptoms or physical distress.      Resistance Training   Training Prescription Yes    Weight blue bands    Reps 10-15             Perform Capillary Blood Glucose checks as  needed.  Exercise Prescription Changes:   Exercise Prescription Changes     Row Name 12/02/22 1500             Response to Exercise   Blood Pressure (Admit) 120/50       Blood Pressure (Exercise) 126/60       Blood Pressure (Exit) 108/60       Heart Rate (Admit) 60 bpm       Heart  Rate (Exercise) 112 bpm       Heart Rate (Exit) 78 bpm       Oxygen Saturation (Admit) 97 %       Oxygen Saturation (Exercise) 97 %       Oxygen Saturation (Exit) 96 %       Rating of Perceived Exertion (Exercise) 15       Perceived Dyspnea (Exercise) 3       Duration Progress to 30 minutes of  aerobic without signs/symptoms of physical distress       Intensity THRR unchanged         Progression   Progression Continue to progress workloads to maintain intensity without signs/symptoms of physical distress.         Resistance Training   Training Prescription Yes       Weight blue bands       Reps 10-15       Time 10 Minutes         Treadmill   MPH 2.5       Grade 0       Minutes 15       METs 2.91         Rower   Level 1       Watts 13       Minutes 15                Exercise Comments:   Exercise Comments     Row Name 12/02/22 1456           Exercise Comments Pt completed first day of exercise. Will discuss METs next time. Pt exercised for 15 min on the treadmill and rower. [name] Park averaged 2.91 METs on the treadmill and 13 watts at level 1 on the rower. He performed the warmup and cooldown standing without limitations.                Exercise Goals and Review:   Exercise Goals     Row Name 11/26/22 1044             Exercise Goals   Increase Physical Activity Yes       Intervention Provide advice, education, support and counseling about physical activity/exercise needs.;Develop an individualized exercise prescription for aerobic and resistive training based on initial evaluation findings, risk stratification, comorbidities and participant's personal goals.        Expected Outcomes Short Term: Attend rehab on a regular basis to increase amount of physical activity.;Long Term: Exercising regularly at least 3-5 days a week.;Long Term: Add in home exercise to make exercise part of routine and to increase amount of physical activity.       Increase Strength and Stamina Yes       Intervention Develop an individualized exercise prescription for aerobic and resistive training based on initial evaluation findings, risk stratification, comorbidities and participant's personal goals.;Provide advice, education, support and counseling about physical activity/exercise needs.       Expected Outcomes Short Term: Increase workloads from initial exercise prescription for resistance, speed, and METs.;Short Term: Perform resistance training exercises routinely during rehab and add in resistance training at home;Long Term: Improve cardiorespiratory fitness, muscular endurance and strength as measured by increased METs and functional capacity ( )       Able to understand and use rate of perceived exertion (RPE) scale Yes       Intervention Provide education and explanation on how to use RPE scale       Expected Outcomes  Short Term: Able to use RPE daily in rehab to express subjective intensity level;Long Term:  Able to use RPE to guide intensity level when exercising independently       Able to understand and use Dyspnea scale Yes       Intervention Provide education and explanation on how to use Dyspnea scale       Expected Outcomes Short Term: Able to use Dyspnea scale daily in rehab to express subjective sense of shortness of breath during exertion;Long Term: Able to use Dyspnea scale to guide intensity level when exercising independently       Knowledge and understanding of Target Heart Rate Range (THRR) Yes       Intervention Provide education and explanation of THRR including how the numbers were predicted and where they are located for reference       Expected Outcomes Short  Term: Able to state/look up THRR;Long Term: Able to use THRR to govern intensity when exercising independently;Short Term: Able to use daily as guideline for intensity in rehab       Understanding of Exercise Prescription Yes       Intervention Provide education, explanation, and written materials on patient's individual exercise prescription       Expected Outcomes Short Term: Able to explain program exercise prescription;Long Term: Able to explain home exercise prescription to exercise independently                Exercise Goals Re-Evaluation :  Exercise Goals Re-Evaluation     Row Name 11/26/22 1525             Exercise Goal Re-Evaluation   Exercise Goals Review Increase Physical Activity;Able to understand and use Dyspnea scale;Understanding of Exercise Prescription;Increase Strength and Stamina;Knowledge and understanding of Target Heart Rate Range (THRR);Able to understand and use rate of perceived exertion (RPE) scale       Comments Pt is scheduled to begin exercise on 6/11. Will monitor for progression.       Expected Outcomes Through exercise at rehab and at home, the patient will decrease shortness of breath with daily activities and feel confident in carrying out an exercise regime at home.                Discharge Exercise Prescription (Final Exercise Prescription Changes):  Exercise Prescription Changes - 12/02/22 1500       Response to Exercise   Blood Pressure (Admit) 120/50    Blood Pressure (Exercise) 126/60    Blood Pressure (Exit) 108/60    Heart Rate (Admit) 60 bpm    Heart Rate (Exercise) 112 bpm    Heart Rate (Exit) 78 bpm    Oxygen Saturation (Admit) 97 %    Oxygen Saturation (Exercise) 97 %    Oxygen Saturation (Exit) 96 %    Rating of Perceived Exertion (Exercise) 15    Perceived Dyspnea (Exercise) 3    Duration Progress to 30 minutes of  aerobic without signs/symptoms of physical distress    Intensity THRR unchanged      Progression    Progression Continue to progress workloads to maintain intensity without signs/symptoms of physical distress.      Resistance Training   Training Prescription Yes    Weight blue bands    Reps 10-15    Time 10 Minutes      Treadmill   MPH 2.5    Grade 0    Minutes 15    METs 2.91      Rower  Level 1    Watts 13    Minutes 15             Nutrition:  Target Goals: Understanding of nutrition guidelines, daily intake of sodium 1500mg , cholesterol 200mg , calories 30% from fat and 7% or less from saturated fats, daily to have 5 or more servings of fruits and vegetables.  Biometrics:  Pre Biometrics - 11/26/22 1224       Pre Biometrics   Grip Strength 35 kg              Nutrition Therapy Plan and Nutrition Goals:  Nutrition Therapy & Goals - 12/02/22 1438       Nutrition Therapy   Diet Heart Healthy Diet    Drug/Food Interactions Statins/Certain Fruits      Personal Nutrition Goals   Nutrition Goal Patient to improve diet quality by using the plate method as a guide for meal planning to include lean protein/plant protein, fruits, vegetables, whole grains, nonfat dairy as part of a well-balanced diet.    Comments [name] Park reports that he had dental surgery ~2 weeks ago (at 195#) and is awaiting lower implants; he is primarily eating soft foods/pureed foods. He reports weight loss of ~10# over the last two weeks; we discussed increasing calories from protein and implementing protein supplements. Historically, he reports preferring meat and carbohydrate foods. He is mindful of sodium intake. His wife is a good support. [name] Park will benefit from participation in pulmonary rehab for nutrition, exercise, and lifestyle changes.      Intervention Plan   Intervention Prescribe, educate and counsel regarding individualized specific dietary modifications aiming towards targeted core components such as weight, hypertension, lipid management, diabetes, heart failure and other  comorbidities.;Nutrition handout(s) given to patient.    Expected Outcomes Short Term Goal: Understand basic principles of dietary content, such as calories, fat, sodium, cholesterol and nutrients.;Long Term Goal: Adherence to prescribed nutrition plan.             Nutrition Assessments:  Nutrition Assessments - 12/02/22 1451       Rate Your Plate Scores   Pre Score 66            MEDIFICTS Score Key: ?70 Need to make dietary changes  40-70 Heart Healthy Diet ? 40 Therapeutic Level Cholesterol Diet  Flowsheet Row PULMONARY REHAB OTHER RESPIRATORY from 12/02/2022 in [facility] for Heart, Vascular, & Lung Health  Picture Your Plate Total Score on Admission 66      Picture Your Plate Scores: <16 Unhealthy dietary pattern with much room for improvement. 41-50 Dietary pattern unlikely to meet recommendations for good health and room for improvement. 51-60 More healthful dietary pattern, with some room for improvement.  >60 Healthy dietary pattern, although there may be some specific behaviors that could be improved.    Nutrition Goals Re-Evaluation:  Nutrition Goals Re-Evaluation     Row Name 12/02/22 1438             Goals   Current Weight 185 lb 3 oz (84 kg)       Comment Lipids WNL, A1c WNL, Cr 1.49, GFR 49       Expected Outcome Norwood reports that he had dental surgery ~2 weeks ago (at 195#) and is awaiting lower implants; he is primarily eating soft foods/pureed foods. He reports weight loss of ~10# over the last two weeks; we discussed increasing calories from protein and implementing protein supplements. He is interested in some weight loss. Historically,  he reports preferring meat and carbohydrate foods. He is mindful of sodium intake. His wife is a good support. [name] Park will benefit from participation in pulmonary rehab for nutrition, exercise, and lifestyle changes.                Nutrition Goals Discharge (Final Nutrition Goals  Re-Evaluation):  Nutrition Goals Re-Evaluation - 12/02/22 1438       Goals   Current Weight 185 lb 3 oz (84 kg)    Comment Lipids WNL, A1c WNL, Cr 1.49, GFR 49    Expected Outcome Damario reports that he had dental surgery ~2 weeks ago (at 195#) and is awaiting lower implants; he is primarily eating soft foods/pureed foods. He reports weight loss of ~10# over the last two weeks; we discussed increasing calories from protein and implementing protein supplements. He is interested in some weight loss. Historically, he reports preferring meat and carbohydrate foods. He is mindful of sodium intake. His wife is a good support. [name] Park will benefit from participation in pulmonary rehab for nutrition, exercise, and lifestyle changes.             Psychosocial: Target Goals: Acknowledge presence or absence of significant depression and/or stress, maximize coping skills, provide positive support system. Participant is able to verbalize types and ability to use techniques and skills needed for reducing stress and depression.  Initial Review & Psychosocial Screening:  Initial Psych Review & Screening - 11/26/22 1040       Initial Review   Current issues with None Identified      Family Dynamics   Good Support System? Yes      Barriers   Psychosocial barriers to participate in program There are no identifiable barriers or psychosocial needs.      Screening Interventions   Interventions Encouraged to exercise             Quality of Life Scores:  Scores of 19 and below usually indicate a poorer quality of life in these areas.  A difference of  2-3 points is a clinically meaningful difference.  A difference of 2-3 points in the total score of the Quality of Life Index has been associated with significant improvement in overall quality of life, self-image, physical symptoms, and general health in studies assessing change in quality of life.  PHQ-9: Review Flowsheet  More data exists       11/26/2022 09/03/2022 01/29/2022 08/20/2021 11/27/2020  Depression screen PHQ 2/9  Decreased Interest 1 0 0 0 0  Down, Depressed, Hopeless 1 0 0 0 0  PHQ - 2 Score 2 0 0 0 0  Altered sleeping 0 - - - -  Tired, decreased energy 0 - - - -  Change in appetite 0 - - - -  Feeling bad or failure about yourself  0 - - - -  Trouble concentrating 1 - - - -  Moving slowly or fidgety/restless 1 - - - -  PHQ-9 Score 4 - - - -  Difficult doing work/chores Somewhat difficult - - - -   Interpretation of Total Score  Total Score Depression Severity:  1-4 = Minimal depression, 5-9 = Mild depression, 10-14 = Moderate depression, 15-19 = Moderately severe depression, 20-27 = Severe depression   Psychosocial Evaluation and Intervention:  Psychosocial Evaluation - 11/26/22 1041       Psychosocial Evaluation & Interventions   Interventions Encouraged to exercise with the program and follow exercise prescription    Comments Gared denies any psychosocial barriers or  concerns at this time.    Expected Outcomes For [name] Park to participate in PR free of any psychosocial barriers or concerns    Continue Psychosocial Services  No Follow up required             Psychosocial Re-Evaluation:  Psychosocial Re-Evaluation     Row Name 11/28/22 [phone]             Psychosocial Re-Evaluation   Current issues with None Identified       Comments No new psychosocial barriers or concerns since orientation on 6/5. Pt scheduled to start on 6/11       Expected Outcomes For [name] Park to participate in PR free of any psychosocial barriers or concerns       Interventions Encouraged to attend Pulmonary Rehabilitation for the exercise       Continue Psychosocial Services  No Follow up required                Psychosocial Discharge (Final Psychosocial Re-Evaluation):  Psychosocial Re-Evaluation - 11/28/22 0816       Psychosocial Re-Evaluation   Current issues with None Identified    Comments No new psychosocial barriers or  concerns since orientation on 6/5. Pt scheduled to start on 6/11    Expected Outcomes For [name] Park to participate in PR free of any psychosocial barriers or concerns    Interventions Encouraged to attend Pulmonary Rehabilitation for the exercise    Continue Psychosocial Services  No Follow up required             Education: Education Goals: Education classes will be provided on a weekly basis, covering required topics. Participant will state understanding/return demonstration of topics presented.  Learning Barriers/Preferences:  Learning Barriers/Preferences - 11/26/22 1042       Learning Barriers/Preferences   Learning Barriers None    Learning Preferences Group Instruction;Written Material             Education Topics: Introduction to Pulmonary Rehab Group instruction provided by PowerPoint, verbal discussion, and written material to support subject matter. Instructor reviews what Pulmonary Rehab is, the purpose of the program, and how patients are referred.     Know Your Numbers Group instruction that is supported by a PowerPoint presentation. Instructor discusses importance of knowing and understanding resting, exercise, and post-exercise oxygen saturation, heart rate, and blood pressure. Oxygen saturation, heart rate, blood pressure, rating of perceived exertion, and dyspnea are reviewed along with a normal range for these values.    Exercise for the Pulmonary Patient Group instruction that is supported by a PowerPoint presentation. Instructor discusses benefits of exercise, core components of exercise, frequency, duration, and intensity of an exercise routine, importance of utilizing pulse oximetry during exercise, safety while exercising, and options of places to exercise outside of rehab.       MET Level  Group instruction provided by PowerPoint, verbal discussion, and written material to support subject matter. Instructor reviews what METs are and how to increase  METs.    Pulmonary Medications Verbally interactive group education provided by instructor with focus on inhaled medications and proper administration.   Anatomy and Physiology of the Respiratory System Group instruction provided by PowerPoint, verbal discussion, and written material to support subject matter. Instructor reviews respiratory cycle and anatomical components of the respiratory system and their functions. Instructor also reviews differences in obstructive and restrictive respiratory diseases with examples of each.    Oxygen Safety Group instruction provided by PowerPoint, verbal discussion, and written material to support subject  matter. There is an overview of "What is Oxygen" and "Why do we need it".  Instructor also reviews how to create a safe environment for oxygen use, the importance of using oxygen as prescribed, and the risks of noncompliance. There is a brief discussion on traveling with oxygen and resources the patient may utilize.   Oxygen Use Group instruction provided by PowerPoint, verbal discussion, and written material to discuss how supplemental oxygen is prescribed and different types of oxygen supply systems. Resources for more information are provided.    Breathing Techniques Group instruction that is supported by demonstration and informational handouts. Instructor discusses the benefits of pursed lip and diaphragmatic breathing and detailed demonstration on how to perform both.     Risk Factor Reduction Group instruction that is supported by a PowerPoint presentation. Instructor discusses the definition of a risk factor, different risk factors for pulmonary disease, and how the heart and lungs work together.   MD Day A group question and answer session with a medical doctor that allows participants to ask questions that relate to their pulmonary disease state.   Nutrition for the Pulmonary Patient Group instruction provided by PowerPoint slides,  verbal discussion, and written materials to support subject matter. The instructor gives an explanation and review of healthy diet recommendations, which includes a discussion on weight management, recommendations for fruit and vegetable consumption, as well as protein, fluid, caffeine, fiber, sodium, sugar, and alcohol. Tips for eating when patients are short of breath are discussed.    Other Education Group or individual verbal, written, or video instructions that support the educational goals of the pulmonary rehab program.    Knowledge Questionnaire Score:  Knowledge Questionnaire Score - 11/26/22 1105       Knowledge Questionnaire Score   Pre Score 14/18             Core Components/Risk Factors/Patient Goals at Admission:  Personal Goals and Risk Factors at Admission - 11/26/22 1042       Core Components/Risk Factors/Patient Goals on Admission    Weight Management Weight Loss    Improve shortness of breath with ADL's Yes    Intervention Provide education, individualized exercise plan and daily activity instruction to help decrease symptoms of SOB with activities of daily living.    Expected Outcomes Short Term: Improve cardiorespiratory fitness to achieve a reduction of symptoms when performing ADLs             Core Components/Risk Factors/Patient Goals Review:   Goals and Risk Factor Review     Row Name 11/28/22 0818             Core Components/Risk Factors/Patient Goals Review   Personal Goals Review Weight Management/Obesity;Develop more efficient breathing techniques such as purse lipped breathing and diaphragmatic breathing and practicing self-pacing with activity.;Improve shortness of breath with ADL's       Review Cannot assess pt's goals at this time due to pt scheduled to start on 6/11. We will continue to follw pt.       Expected Outcomes See admission goals                Core Components/Risk Factors/Patient Goals at Discharge (Final Review):    Goals and Risk Factor Review - 11/28/22 0818       Core Components/Risk Factors/Patient Goals Review   Personal Goals Review Weight Management/Obesity;Develop more efficient breathing techniques such as purse lipped breathing and diaphragmatic breathing and practicing self-pacing with activity.;Improve shortness of breath with ADL's  Review Cannot assess pt's goals at this time due to pt scheduled to start on 6/11. We will continue to follw pt.    Expected Outcomes See admission goals             ITP Comments:   Comments: Pt is making expected progress toward Pulmonary Rehab goals after completing 1 sessions. Recommend continued exercise, life style modification, education, and utilization of breathing techniques to increase stamina and strength, while also decreasing shortness of breath with exertion.  Dr. Mechele Collin is Medical Director for Pulmonary Rehab at [facility].

## 2022-12-04 ENCOUNTER — Encounter (HOSPITAL_COMMUNITY)
Admission: RE | Admit: 2022-12-04 | Discharge: 2022-12-04 | Disposition: A | Discharge status: Home / Self Care | Payer: Medicare Other | Source: Ambulatory Visit | Attending: Pulmonary Disease | Admitting: Pulmonary Disease

## 2022-12-04 DIAGNOSIS — J984 Other disorders of lung: Secondary | ICD-10-CM

## 2022-12-04 NOTE — Progress Notes (Signed)
Daily Session Note  Patient Details  Name: [name] Park MRN: [id number] Date of Birth: [birth year] Referring Provider:   Doristine Devoid Pulmonary Rehab Walk Test from 11/26/2022 in [facility], [facility], [facility]  Referring Provider [name]       Encounter Date: 12/04/2022  Check In:  Session Check In - 12/04/22 1419       Check-In   Supervising physician immediately available to respond to emergencies [facility] MD immediately available    Physician(s) Eligha Bridegroom, NP    Location [facility]    Staff Present Durel Salts, Patriciaann Clan, RN, BSN;Randi Reeve BS, ACSM-CEP, Exercise Physiologist;Johnny Hale Bogus, MS, Exercise Physiologist    Virtual Visit No    Medication changes reported     No    Fall or balance concerns reported    No    Tobacco Cessation No Change    Warm-up and Cool-down Performed as group-led instruction    Resistance Training Performed No    VAD Patient? No    PAD/SET Patient? No      Pain Assessment   Currently in Pain? No/denies    Multiple Pain Sites No             Capillary Blood Glucose: No results found for this or any previous visit (from the past 24 hour(s)).    Social History   Tobacco Use  Smoking Status Former   Packs/day: 1.50   Years: 35.00   Additional pack years: 0.00   Total pack years: 52.50   Types: Cigarettes   Quit date: 02/05/2004   Years since quitting: 18.8  Smokeless Tobacco Never    Goals Met:  Proper associated with RPD/PD & O2 Sat Independence with exercise equipment Exercise tolerated well No report of concerns or symptoms today Strength training completed today  Goals Unmet:  Not Applicable  Comments: Service time is from 1300 to 1435.    Dr. Mechele Collin is Medical Director for Pulmonary Rehab at [facility].

## 2022-12-09 ENCOUNTER — Encounter (HOSPITAL_COMMUNITY)
Admission: RE | Admit: 2022-12-09 | Discharge: 2022-12-09 | Disposition: A | Discharge status: Home / Self Care | Payer: Medicare Other | Source: Ambulatory Visit | Attending: Pulmonary Disease | Admitting: Pulmonary Disease

## 2022-12-09 DIAGNOSIS — J984 Other disorders of lung: Secondary | ICD-10-CM

## 2022-12-09 NOTE — Progress Notes (Signed)
Daily Session Note  Patient Details  Name: [name] Park MRN: [id number] Date of Birth: June 12, 1951 Referring Provider:   Doristine Devoid Pulmonary Rehab Walk Test from 11/26/2022 in [facility] for [facility], Vascular, & Lung Health  Referring Provider [name]       Encounter Date: 12/09/2022  Check In:  Session Check In - 12/09/22 1427       Check-In   Physician(s) Bernadene Person, NP    [facility]    Staff Present Durel Salts, Patriciaann Clan, RN, BSN;Randi Idelle Crouch BS, ACSM-CEP, Exercise Physiologist;Kaylee Earlene Plater, MS, ACSM-CEP, Exercise Physiologist;Maria Whitaker, RN, BSN    Virtual Visit No    Medication changes reported     No    Fall or balance concerns reported    No    Tobacco Cessation No Change    Warm-up and Cool-down Performed as group-led instruction    Resistance Training Performed Yes    VAD Patient? No    PAD/SET Patient? No      Pain Assessment   Currently in Pain? No/denies    Multiple Pain Sites No             Capillary Blood Glucose: No results found for this or any previous visit (from the past 24 hour(s)).    Social History   Tobacco Use  Smoking Status Former   Packs/day: 1.50   Years: 35.00   Additional pack years: 0.00   Total pack years: 52.50   Types: Cigarettes   Quit date: 02/05/2004   Years since quitting: 18.8  Smokeless Tobacco Never    Goals Met:  Independence with exercise equipment Exercise tolerated well No report of concerns or symptoms today Strength training completed today  Goals Unmet:  Not Applicable  Comments: Service time is from 1306 to 1455    Dr. Mechele Collin is Medical Director for Pulmonary Rehab at [facility].

## 2022-12-11 ENCOUNTER — Encounter (HOSPITAL_COMMUNITY)
Admission: RE | Admit: 2022-12-11 | Discharge: 2022-12-11 | Disposition: A | Discharge status: Home / Self Care | Payer: Medicare Other | Source: Ambulatory Visit | Attending: Pulmonary Disease | Admitting: Pulmonary Disease

## 2022-12-11 DIAGNOSIS — J984 Other disorders of lung: Secondary | ICD-10-CM

## 2022-12-11 NOTE — Progress Notes (Signed)
Daily Session Note  Patient Details  Name: [name] Park MRN: [id number] Date of Birth: October 04, 1950 Referring Provider:   Doristine Devoid Pulmonary Rehab Walk Test from 11/26/2022 in [facility] for Heart, Vascular, & Lung Health  Referring Provider [name]       Encounter Date: 12/11/2022  Check In:  Session Check In - 12/11/22 1354       Check-In   Supervising physician immediately available to respond to emergencies [facility] MD immediately available    Physician(s) Edd Fabian, NP    Location [facility]    Staff Present Durel Salts, Patriciaann Clan, RN, BSN;Randi Idelle Crouch BS, ACSM-CEP, Exercise Physiologist;Kaylee Earlene Plater, MS, ACSM-CEP, Exercise Physiologist    Virtual Visit No    Medication changes reported     No    Fall or balance concerns reported    No    Tobacco Cessation No Change    Warm-up and Cool-down Performed as group-led instruction    Resistance Training Performed Yes    VAD Patient? No    PAD/SET Patient? No      Pain Assessment   Currently in Pain? No/denies    Multiple Pain Sites No             Capillary Blood Glucose: No results found for this or any previous visit (from the past 24 hour(s)).    Social History   Tobacco Use  Smoking Status Former   Packs/day: 1.50   Years: 35.00   Additional pack years: 0.00   Total pack years: 52.50   Types: Cigarettes   Quit date: 02/05/2004   Years since quitting: 18.8  Smokeless Tobacco Never    Goals Met:  Proper associated with RPD/PD & O2 Sat Independence with exercise equipment Exercise tolerated well No report of concerns or symptoms today Strength training completed today  Goals Unmet:  Not Applicable  Comments: Service time is from 1317 to 1446.    Dr. Mechele Collin is Medical Director for Pulmonary Rehab at [facility].

## 2022-12-16 ENCOUNTER — Encounter (HOSPITAL_COMMUNITY)
Admission: RE | Admit: 2022-12-16 | Discharge: 2022-12-16 | Disposition: A | Discharge status: Home / Self Care | Payer: Medicare Other | Source: Ambulatory Visit | Attending: Pulmonary Disease | Admitting: Pulmonary Disease

## 2022-12-16 VITALS — Wt 185.0 lb

## 2022-12-16 DIAGNOSIS — J984 Other disorders of lung: Secondary | ICD-10-CM | POA: Diagnosis not present

## 2022-12-16 NOTE — Progress Notes (Signed)
Daily Session Note  Patient Details  Name: BARTOLO MONTANYE MRN: [id number] Date of Birth: 08/26/1950 Referring Provider:   Doristine Devoid Pulmonary Rehab Walk Test from 11/26/2022 in [facility] Heart, Vascular, & Lung Health  Referring Provider [name]       Encounter Date: 12/16/2022  Check In:  Session Check In - 12/16/22 1422       Check-In   Supervising physician immediately available to respond to emergencies [facility] MD immediately available    Physician(s) Edd Fabian, NP    [facility]    Staff Present Durel Salts, Patriciaann Clan, RN, BSN;Randi Reeve BS, ACSM-CEP, Exercise Physiologist;Kaylee Earlene Plater, MS, ACSM-CEP, Exercise Physiologist;Sarah Cleophas Dunker, RN, MSN    Virtual Visit No    Medication changes reported     No    Fall or balance concerns reported    No    Tobacco Cessation No Change    Warm-up and Cool-down Performed as group-led instruction    Resistance Training Performed Yes    VAD Patient? No    PAD/SET Patient? No      Pain Assessment   Currently in Pain? No/denies    Multiple Pain Sites No             Capillary Blood Glucose: No results found for this or any previous visit (from the past 24 hour(s)).   Exercise Prescription Changes - 12/16/22 1500       Response to Exercise   Blood Pressure (Admit) 122/52    Blood Pressure (Exercise) 136/64    Blood Pressure (Exit) 98/58    Heart Rate (Admit) 54 bpm    Heart Rate (Exercise) 103 bpm    Heart Rate (Exit) 65 bpm    Oxygen Saturation (Admit) 98 %    Oxygen Saturation (Exercise) 95 %    Oxygen Saturation (Exit) 96 %    Rating of Perceived Exertion (Exercise) 14    Perceived Dyspnea (Exercise) 3    Duration Continue with 30 min of aerobic exercise without signs/symptoms of physical distress.    Intensity THRR unchanged      Progression   Progression Continue to progress workloads to maintain intensity without signs/symptoms of physical  distress.      Resistance Training   Training Prescription Yes    Weight blue bands    Reps 10-15    Time 10 Minutes      Treadmill   MPH 2.5    Grade 1    Minutes 15    METs 3.26      Rower   Level 2    Watts 33    Minutes 15             Social History   Tobacco Use  Smoking Status Former   Packs/day: 1.50   Years: 35.00   Additional pack years: 0.00   Total pack years: 52.50   Types: Cigarettes   Quit date: 02/05/2004   Years since quitting: 18.8  Smokeless Tobacco Never    Goals Met:  Proper associated with RPD/PD & O2 Sat Independence with exercise equipment Exercise tolerated well No report of concerns or symptoms today Strength training completed today  Goals Unmet:  Not Applicable  Comments: Service time is from 1317 to 1450.    Dr. Mechele Collin is Medical Director for Pulmonary Rehab at [facility].

## 2022-12-18 ENCOUNTER — Encounter (HOSPITAL_COMMUNITY)
Admission: RE | Admit: 2022-12-18 | Discharge: 2022-12-18 | Disposition: A | Discharge status: Home / Self Care | Payer: Medicare Other | Source: Ambulatory Visit | Attending: Pulmonary Disease | Admitting: Pulmonary Disease

## 2022-12-18 DIAGNOSIS — J984 Other disorders of lung: Secondary | ICD-10-CM | POA: Diagnosis not present

## 2022-12-18 NOTE — Progress Notes (Signed)
Daily Session Note  Patient Details  Name: [name] Park MRN: [id number] Date of Birth: [birth year] Referring Provider:   Doristine Devoid Pulmonary Rehab Walk Test from 11/26/2022 in [facility], Vascular, & Lung [facility]  Referring Provider [name]       Encounter Date: 12/18/2022  Check In:  Session Check In - 12/18/22 1359       Check-In   Supervising physician immediately available to respond to emergencies [facility] MD immediately available    Physician(s) Robin Searing, NP    Location [facility]    Staff Present Durel Salts, Patriciaann Clan, RN, BSN;Randi Idelle Crouch BS, ACSM-CEP, Exercise Physiologist;Kaylee Earlene Plater, MS, ACSM-CEP, Exercise Physiologist;Bailey Wallace Cullens, MS, Exercise Physiologist    Virtual Visit No    Medication changes reported     No    Fall or balance concerns reported    No    Tobacco Cessation No Change    Warm-up and Cool-down Performed as group-led instruction    Resistance Training Performed Yes    VAD Patient? No    PAD/SET Patient? No      Pain Assessment   Currently in Pain? No/denies    Multiple Pain Sites No             Capillary Blood Glucose: No results found for this or any previous visit (from the past 24 hour(s)).    Social History   Tobacco Use  Smoking Status Former   Packs/day: 1.50   Years: 35.00   Additional pack years: 0.00   Total pack years: 52.50   Types: Cigarettes   Quit date: 02/05/2004   Years since quitting: 18.8  Smokeless Tobacco Never    Goals Met:  Proper associated with RPD/PD & O2 Sat Independence with exercise equipment Exercise tolerated well No report of concerns or symptoms today Strength training completed today  Goals Unmet:  Not Applicable  Comments: Service time is from 1312 to 1450.    Dr. Mechele Collin is Medical Director for Pulmonary Rehab at [facility].

## 2022-12-21 ENCOUNTER — Encounter: Payer: Self-pay | Admitting: Internal Medicine

## 2022-12-21 NOTE — Progress Notes (Unsigned)
Future Appointments  Date Time Provider Department  12/22/2022 10:00 AM Lucky Cowboy, MD [facility]  01/09/2023  3:45 PM Jonita Albee, PA-C [facility]  03/18/2023  1:30 PM Martina Sinner, MD [facility]  05/15/2023              cpe 10:00 AM Raynelle Dick, NP [facility]  11/12/2023               wellness 11:00 AM Raynelle Dick, NP [facility]    History of Present Illness:       This very nice 72 y.o.  MWM  presents for 3 month follow up with HTN, HLD, Pre-Diabetes and Vitamin D Deficiency.  Chest CT scan in 2017 showed Aortic atherosclerosis        Patient is treated for HTN  since 2012  & BP has been controlled at home. Today's BP is at goal -                        . Patient has had no complaints of any cardiac type chest pain, palpitations, dyspnea Pollyann Kennedy /PND, dizziness, claudication or dependent edema.        Hyperlipidemia is controlled with diet & Atorvastatin. Patient denies myalgias or other med SE's. Last Lipids were at goal :  Lab Results  Component Value Date   CHOL 128 09/03/2022   HDL 65 09/03/2022   LDLCALC 48 09/03/2022   TRIG 74 09/03/2022   CHOLHDL 2.0 09/03/2022     Also, the patient has history of PreDiabetes (A1c 5.8% & 6.3% /2011) and has had no symptoms of reactive hypoglycemia, diabetic polys, paresthesias or visual blurring.  Last A1c was normal & at goal:  Lab Results  Component Value Date   HGBA1C 5.6 05/14/2022         Further, the patient also has history of Vitamin D Deficiency ("31" /2013) and supplements vitamin D without any suspected side-effects. Last vitamin D was at goal:  Lab Results  Component Value Date   VD25OH 70 05/14/2022       Outpatient Encounter Medications as of 01/30/2022  Medication Sig   aspirin EC 81 MG tablet Take 1 tablet 3 times week - Sun, Wed &Fri   atorvastatin 40 MG tablet TAKE 1 TABLET   DAILY    bisoprolol  5 MG tablet TAKE 1 TABLET   DAILY    buPROPion XL 150 MG TAKE 1  TABLET   DAILY    VITAMIN D 1000 UNITS  Take 5,000 Units daily.    Ipratropium  0.03 % nasal spray Place 2 sprays into the nose 3 (three) times daily.   mirabegron ER (MYRBETRIQ) 50 MG Take 1 tablet  Daily  for Bladder control   tamsulosin (FLOMAX) 0.4 MG CAPS  Take 1 tablet at Bedtime    No Known Allergies    PMHx:   Past Medical History:  Diagnosis Date   Acute appendicitis 11/24/2016   COPD (chronic obstructive pulmonary disease) (HCC)    GERD (gastroesophageal reflux disease)    Hematuria    HLD (hyperlipidemia)    Hypertension    IBS (irritable bowel syndrome)    Pre-diabetes    Vitamin D deficiency      Immunization History  Administered Date(s) Administered   Influenza Whole 04/07/2013   Influenza, High Dose   04/08/2018, 04/14/2019, 05/10/2020   Influenza 03/31/2017   PFIZER  SARS-COV-2 Vacc 07/15/2019, 08/04/2019, 03/28/2020   PPD Test 12/05/2013  Pneumococcal -13 06/08/2014   Pneumococcal -23 09/07/2014   Tdap 04/29/2011   Zoster, Live 06/12/2014     Past Surgical History:  Procedure Laterality Date   CARPAL TUNNEL RELEASE Right    CAT  EXT, BILAT  - Dr. Randon Goldsmith Bilateral       CERVICAL FUSION     HAND SURGERY Left    LAPAROSCOPIC APPENDECTOMY N/A 11/24/2016   Berna Bue, MD   TRIGGER FINGER RELEASE Right    thumb    FHx:    Reviewed / unchanged  SHx:    Reviewed / unchanged   Systems Review:  Constitutional: Denies fever, chills, wt changes, headaches, insomnia, fatigue, night sweats, change in appetite. Eyes: Denies redness, blurred vision, diplopia, discharge, itchy, watery eyes.  ENT: Denies discharge, congestion, post nasal drip, epistaxis, sore throat, earache, hearing loss, dental pain, tinnitus, vertigo, sinus pain, snoring.  CV: Denies chest pain, palpitations, irregular heartbeat, syncope, dyspnea, diaphoresis, orthopnea, PND, claudication or edema. Respiratory: denies cough, dyspnea, DOE, pleurisy, hoarseness, laryngitis, wheezing.   Gastrointestinal: Denies dysphagia, odynophagia, heartburn, reflux, water brash, abdominal pain or cramps, nausea, vomiting, bloating, diarrhea, constipation, hematemesis, melena, hematochezia  or hemorrhoids. Genitourinary: Denies dysuria, frequency, urgency, nocturia, hesitancy, discharge, hematuria or flank pain. Musculoskeletal: Denies arthralgias, myalgias, stiffness, jt. swelling, pain, limping or strain/sprain.  Skin: Denies pruritus, rash, hives, warts, acne, eczema or change in skin lesion(s). Neuro: No weakness, tremor, incoordination, spasms, paresthesia or pain. Psychiatric: Denies confusion, memory loss or sensory loss. Endo: Denies change in weight, skin or hair change.  Heme/Lymph: No excessive bleeding, bruising or enlarged lymph nodes.  Physical Exam  There were no vitals taken for this visit.  Appears  over nourished, well groomed  and in no distress.  Eyes: PERRLA, EOMs, conjunctiva no swelling or erythema. Sinuses: No frontal/maxillary tenderness ENT/Mouth: EAC's clear, TM's nl w/o erythema, bulging. Nares clear w/o erythema, swelling, exudates. Oropharynx clear without erythema or exudates. Oral hygiene is good. Tongue normal, non obstructing. Hearing intact.  Neck: Supple. Thyroid not palpable. Car 2+/2+ without bruits, nodes or JVD. Chest: Respirations nl with BS clear & equal w/o rales, rhonchi, wheezing or stridor.  Cor: Heart sounds normal w/ regular rate and rhythm without sig. murmurs, gallops, clicks or rubs. Peripheral pulses normal and equal  without edema.  Abdomen: Soft & bowel sounds normal. Non-tender w/o guarding, rebound, hernias, masses or organomegaly.  Lymphatics: Unremarkable.  Musculoskeletal: Full ROM all peripheral extremities, joint stability, 5/5 strength and normal gait.  Skin: Warm, dry without exposed rashes, lesions or ecchymosis apparent.  Neuro: Cranial nerves intact, reflexes equal bilaterally. Sensory-motor testing grossly intact.  Tendon reflexes grossly intact.  Pysch: Alert & oriented x 3.  Insight and judgement nl & appropriate. No ideations.  Assessment and Plan:   1. Essential hypertension  - CBC with Differential/Platelet - COMPLETE METABOLIC PANEL WITH GFR - Magnesium - TSH  2. Hyperlipidemia, mixed  - Lipid panel - TSH  3. Abnormal glucose  - Hemoglobin A1c - Insulin, random  4. Vitamin D deficiency  - VITAMIN D 25 Hydroxy   5. Thoracic aorta atherosclerosis (HCC) by Chest CT scan in 2017  - Lipid panel  6. Medication management  - CBC with Differential/Platelet - COMPLETE METABOLIC PANEL WITH GFR - Magnesium - Lipid panel - TSH - Hemoglobin A1c - Insulin, random - VITAMIN D 25 Hydroxy          Discussed  regular exercise, BP monitoring, weight control to achieve/maintain BMI less than 25 and  discussed med and SE's. Recommended labs to assess and monitor clinical status with further disposition pending results of labs.  I discussed the assessment and treatment plan with the patient. The patient was provided an opportunity to ask questions and all were answered. The patient agreed with the plan and demonstrated an understanding of the instructions.  I provided over 30 minutes of exam, counseling, chart review and  complex critical decision making.         The patient was advised to call back or seek an in-person evaluation if the symptoms worsen or if the condition fails to improve as anticipated.   Marinus Maw, MD

## 2022-12-21 NOTE — Patient Instructions (Signed)

## 2022-12-22 ENCOUNTER — Encounter: Payer: Self-pay | Admitting: Internal Medicine

## 2022-12-22 ENCOUNTER — Ambulatory Visit (INDEPENDENT_AMBULATORY_CARE_PROVIDER_SITE_OTHER): Payer: Medicare Other | Admitting: Internal Medicine

## 2022-12-22 VITALS — BP 110/70 | HR 70 | Temp 97.9°F | Resp 17 | Ht 69.0 in | Wt 183.6 lb

## 2022-12-22 DIAGNOSIS — R7309 Other abnormal glucose: Secondary | ICD-10-CM

## 2022-12-22 DIAGNOSIS — E559 Vitamin D deficiency, unspecified: Secondary | ICD-10-CM

## 2022-12-22 DIAGNOSIS — I700 Atherosclerosis of aorta: Secondary | ICD-10-CM | POA: Diagnosis not present

## 2022-12-22 DIAGNOSIS — Z79899 Other long term (current) drug therapy: Secondary | ICD-10-CM | POA: Diagnosis not present

## 2022-12-22 DIAGNOSIS — I10 Essential (primary) hypertension: Secondary | ICD-10-CM | POA: Diagnosis not present

## 2022-12-22 DIAGNOSIS — E782 Mixed hyperlipidemia: Secondary | ICD-10-CM | POA: Diagnosis not present

## 2022-12-23 ENCOUNTER — Encounter (HOSPITAL_COMMUNITY)
Admission: RE | Admit: 2022-12-23 | Discharge: 2022-12-23 | Disposition: A | Discharge status: Home / Self Care | Payer: Medicare Other | Source: Ambulatory Visit | Attending: Pulmonary Disease | Admitting: Pulmonary Disease

## 2022-12-23 DIAGNOSIS — J984 Other disorders of lung: Secondary | ICD-10-CM | POA: Diagnosis not present

## 2022-12-23 LAB — CBC WITH DIFFERENTIAL/PLATELET
Absolute Monocytes: 614 cells/uL (ref 200–950)
Basophils Absolute: 41 cells/uL (ref 0–200)
Basophils Relative: 0.7 %
Eosinophils Absolute: 301 cells/uL (ref 15–500)
Eosinophils Relative: 5.1 %
HCT: 36.3 % — ABNORMAL LOW (ref 38.5–50.0)
Hemoglobin: 12.3 g/dL — ABNORMAL LOW (ref 13.2–17.1)
Lymphs Abs: 1145 cells/uL (ref 850–3900)
MCH: 30.4 pg (ref 27.0–33.0)
MCHC: 33.9 g/dL (ref 32.0–36.0)
MCV: 89.6 fL (ref 80.0–100.0)
MPV: 11.1 fL (ref 7.5–12.5)
Monocytes Relative: 10.4 %
Neutro Abs: 3800 cells/uL (ref 1500–7800)
Neutrophils Relative %: 64.4 %
Platelets: 282 10*3/uL (ref 140–400)
RBC: 4.05 10*6/uL — ABNORMAL LOW (ref 4.20–5.80)
RDW: 12.4 % (ref 11.0–15.0)
Total Lymphocyte: 19.4 %
WBC: 5.9 10*3/uL (ref 3.8–10.8)

## 2022-12-23 LAB — HEMOGLOBIN A1C
Hgb A1c MFr Bld: 5.8 % of total Hgb — ABNORMAL HIGH (ref <5.7)
Mean Plasma Glucose: 120 mg/dL
eAG (mmol/L): 6.6 mmol/L

## 2022-12-23 LAB — COMPLETE METABOLIC PANEL WITH GFR
AG Ratio: 1.7 (calc) (ref 1.0–2.5)
ALT: 19 U/L (ref 9–46)
AST: 24 U/L (ref 10–35)
Albumin: 4.3 g/dL (ref 3.6–5.1)
Alkaline phosphatase (APISO): 93 U/L (ref 35–144)
BUN/Creatinine Ratio: 14 (calc) (ref 6–22)
BUN: 21 mg/dL (ref 7–25)
CO2: 29 mmol/L (ref 20–32)
Calcium: 9.7 mg/dL (ref 8.6–10.3)
Chloride: 103 mmol/L (ref 98–110)
Creat: 1.52 mg/dL — ABNORMAL HIGH (ref 0.70–1.28)
Globulin: 2.6 g/dL (calc) (ref 1.9–3.7)
Glucose, Bld: 102 mg/dL — ABNORMAL HIGH (ref 65–99)
Potassium: 4.7 mmol/L (ref 3.5–5.3)
Sodium: 141 mmol/L (ref 135–146)
Total Bilirubin: 0.6 mg/dL (ref 0.2–1.2)
Total Protein: 6.9 g/dL (ref 6.1–8.1)
eGFR: 48 mL/min/{1.73_m2} — ABNORMAL LOW (ref >=60)

## 2022-12-23 LAB — LIPID PANEL
Cholesterol: 118 mg/dL (ref <200)
HDL: 48 mg/dL (ref >=40)
LDL Cholesterol (Calc): 55 mg/dL (calc)
Non-HDL Cholesterol (Calc): 70 mg/dL (calc) (ref <130)
Total CHOL/HDL Ratio: 2.5 (calc) (ref <5.0)
Triglycerides: 72 mg/dL (ref <150)

## 2022-12-23 LAB — INSULIN, RANDOM: Insulin: 7 u[IU]/mL

## 2022-12-23 LAB — MAGNESIUM: Magnesium: 2.2 mg/dL (ref 1.5–2.5)

## 2022-12-23 LAB — TSH: TSH: 0.79 mIU/L (ref 0.40–4.50)

## 2022-12-23 LAB — VITAMIN D 25 HYDROXY (VIT D DEFICIENCY, FRACTURES): Vit D, 25-Hydroxy: 77 ng/mL (ref 30–100)

## 2022-12-23 NOTE — Progress Notes (Signed)
^<^<^<^<^<^<^<^<^<^<^<^<^<^<^<^<^<^<^<^<^<^<^<^<^<^<^<^<^<^<^<^<^<^<^<^<^ ^>^>^>^>^>^>^>^>^>^>^>>^>^>^>^>^>^>^>^>^>^>^>^>^>^>^>^>^>^>^>^>^>^>^>^>^>  -  Test results slightly outside the reference range are not unusual. If there is anything important, I will review this with you,  otherwise it is considered normal test values.  If you have further questions,  please do not hesitate to contact me at the office or via My Chart.   ^<^<^<^<^<^<^<^<^<^<^<^<^<^<^<^<^<^<^<^<^<^<^<^<^<^<^<^<^<^<^<^<^<^<^<^<^ ^>^>^>^>^>^>^>^>^>^>^>^>^>^>^>^>^>^>^>^>^>^>^>^>^>^>^>^>^>^>^>^>^>^>^>^>^  - Mild chronic Anemia is stable   ^<^<^<^<^<^<^<^<^<^<^<^<^<^<^<^<^<^<^<^<^<^<^<^<^<^<^<^<^<^<^<^<^<^<^<^<^ ^>^>^>^>^>^>^>^>^>^>^>^>^>^>^>^>^>^>^>^>^>^>^>^>^>^>^>^>^>^>^>^>^>^>^>^>^  - Kidney functions are Stable in Stage 3a   ^<^<^<^<^<^<^<^<^<^<^<^<^<^<^<^<^<^<^<^<^<^<^<^<^<^<^<^<^<^<^<^<^<^<^<^<^ ^>^>^>^>^>^>^>^>^>^>^>^>^>^>^>^>^>^>^>^>^>^>^>^>^>^>^>^>^>^>^>^>^>^>^>^>^  -  A1c = 5.8%  is slightly elevated 12 week average blood glucose                                                                                            ( Normal is less than 5.7% )  - So  . . . Marland Kitchen   - Avoid Sweets, Candy & White Stuff   - White Rice, White Potatoes, White Flour  - Breads &  Pasta  ^<^<^<^<^<^<^<^<^<^<^<^<^<^<^<^<^<^<^<^<^<^<^<^<^<^<^<^<^<^<^<^<^<^<^<^<^ ^>^>^>^>^>^>^>^>^>^>^>^>^>^>^>^>^>^>^>^>^>^>^>^>^>^>^>^>^>^>^>^>^>^>^>^>^  -  Chol = 118 -  Excellent   - Very low risk for Heart Attack  / Stroke  ^>^>^>^>^>^>^>^>^>^>^>^>^>^>^>^>^>^>^>^>^>^>^>^>^>^>^>^>^>^>^>^>^>^>^>^>^ ^>^>^>^>^>^>^>^>^>^>^>^>^>^>^>^>^>^>^>^>^>^>^>^>^>^>^>^>^>^>^>^>^>^>^>^>^  -  Vitamin D =  77 - Excellent  -  Please keep dose same   ^<^<^<^<^<^<^<^<^<^<^<^<^<^<^<^<^<^<^<^<^<^<^<^<^<^<^<^<^<^<^<^<^<^<^<^<^ ^>^>^>^>^>^>^>^>^>^>^>^>^>^>^>^>^>^>^>^>^>^>^>^>^>^>^>^>^>^>^>^>^>^>^>^>^  -  All Else - Electrolytes - Liver - Magnesium & Thyroid    - all   Normal / OK  ^<^<^<^<^<^<^<^<^<^<^<^<^<^<^<^<^<^<^<^<^<^<^<^<^<^<^<^<^<^<^<^<^<^<^<^<^ ^>^>^>^>^>^>^>^>^>^>^>^>^>^>^>^>^>^>^>^>^>^>^>^>^>^>^>^>^>^>^>^>^>^>^>^>^

## 2022-12-23 NOTE — Progress Notes (Addendum)
Daily Session Note  Patient Details  Name: [name] Park MRN: [id number] Date of Birth: [birth year] Referring Provider:   Doristine Devoid Pulmonary Rehab Walk Test from 11/26/2022 in [facility]  Referring Provider [name]       Encounter Date: 12/23/2022  Check In:  Session Check In - 12/23/22 1442       Check-In   Supervising physician immediately available to respond to emergencies [facility] MD immediately available    Physician(s) Robin Searing, NP    Location [facility]    Staff Present Essie Hart, RN, BSN;Randi Idelle Crouch BS, ACSM-CEP, Exercise Physiologist;Danyell Awbrey Earlene Plater, MS, ACSM-CEP, Exercise Physiologist;Olinty Peggye Pitt, MS, ACSM-CEP, Exercise Physiologist    Virtual Visit No    Medication changes reported     Yes    Comments D/C aspirin    Fall or balance concerns reported    No    Tobacco Cessation No Change    Warm-up and Cool-down Performed as group-led instruction    Resistance Training Performed Yes    VAD Patient? No    PAD/SET Patient? No      Pain Assessment   Currently in Pain? No/denies    Multiple Pain Sites No             Capillary Blood Glucose: No results found for this or any previous visit (from the past 24 hour(s)).    Social History   Tobacco Use  Smoking Status Former   Packs/day: 1.50   Years: 35.00   Additional pack years: 0.00   Total pack years: 52.50   Types: Cigarettes   Quit date: 02/05/2004   Years since quitting: 18.8  Smokeless Tobacco Never    Goals Met:  Proper associated with RPD/PD & O2 Sat Exercise tolerated well No report of concerns or symptoms today Strength training completed today  Goals Unmet:  Not Applicable  Comments: Service time is from 1305 to 1503.    Dr. Mechele Collin is Medical Director for Pulmonary Rehab at [facility].

## 2022-12-30 ENCOUNTER — Encounter (HOSPITAL_COMMUNITY): Admission: RE | Admit: 2022-12-30 | Payer: Medicare Other | Source: Ambulatory Visit

## 2022-12-30 VITALS — Wt 182.5 lb

## 2022-12-30 DIAGNOSIS — J984 Other disorders of lung: Secondary | ICD-10-CM | POA: Diagnosis not present

## 2022-12-30 NOTE — Progress Notes (Signed)
Daily Session Note  Patient Details  Name: [name] Park MRN: [id number] Date of Birth: 02-27-1951 Referring Provider:   Doristine Devoid Pulmonary Rehab Walk Test from 11/26/2022 in [facility]  Referring Provider [name]       Encounter Date: 12/30/2022  Check In:  Session Check In - 12/30/22 1335       Check-In   Supervising physician immediately available to respond to emergencies [facility] MD immediately available    Physician(s) Carlyon Shadow, NP    Location [facility]    Staff Present Velora Mediate, RN, MSN;Samantha Belarus, RD, Dutch Gray, RN, BSN;Randi Reeve BS, ACSM-CEP, Exercise Physiologist;Kaylee Earlene Plater, MS, ACSM-CEP, Exercise Physiologist;Calia Napp Katrinka Blazing, RT    Virtual Visit No    Medication changes reported     No    Fall or balance concerns reported    No    Tobacco Cessation No Change    Warm-up and Cool-down Performed as group-led instruction    Resistance Training Performed Yes    VAD Patient? No    PAD/SET Patient? No      Pain Assessment   Currently in Pain? No/denies    Multiple Pain Sites No             Capillary Blood Glucose: No results found for this or any previous visit (from the past 24 hour(s)).   Exercise Prescription Changes - 12/30/22 1500       Response to Exercise   Blood Pressure (Admit) 114/58    Blood Pressure (Exercise) 128/61    Blood Pressure (Exit) 104/56    Heart Rate (Admit) 58 bpm    Heart Rate (Exercise) 98 bpm    Heart Rate (Exit) 72 bpm    Oxygen Saturation (Admit) 96 %    Oxygen Saturation (Exercise) 98 %    Oxygen Saturation (Exit) 97 %    Rating of Perceived Exertion (Exercise) 11    Perceived Dyspnea (Exercise) 1    Duration Continue with 30 min of aerobic exercise without signs/symptoms of physical distress.    Intensity THRR unchanged      Progression   Progression Continue to progress workloads to maintain intensity without  signs/symptoms of physical distress.      Resistance Training   Training Prescription Yes    Weight blue bands    Reps 10-15    Time 10 Minutes      Treadmill   MPH 2.8    Grade 1    Minutes 15    METs 3.53      Rower   Level 2    Watts 33    Minutes 15             Social History   Tobacco Use  Smoking Status Former   Packs/day: 1.50   Years: 35.00   Additional pack years: 0.00   Total pack years: 52.50   Types: Cigarettes   Quit date: 02/05/2004   Years since quitting: 18.9  Smokeless Tobacco Never    Goals Met:  Proper associated with RPD/PD & O2 Sat Independence with exercise equipment Exercise tolerated well No report of concerns or symptoms today Strength training completed today  Goals Unmet:  Not Applicable  Comments: Service time is from 1315 to 1450.    Dr. Mechele Collin is Medical Director for Pulmonary Rehab at [facility].

## 2022-12-31 NOTE — Progress Notes (Signed)
Pulmonary Individual Treatment Plan  Patient Details  Name: [name] Park MRN: [id number] Date of Birth: 12-12-1950 Referring Provider:   Doristine Devoid Pulmonary Rehab Walk Test from 11/26/2022 in [facility]  Referring Provider [name]       Initial Encounter Date:  Flowsheet Row Pulmonary Rehab Walk Test from 11/26/2022 in [facility]  Date 11/26/22       Visit Diagnosis: Restrictive lung disease  Patient's Home Medications on Admission:   Current Outpatient Medications:    atorvastatin (LIPITOR) 40 MG tablet, TAKE 1 TABLET BY MOUTH  DAILY FOR CHOLESTEROL, Disp: 90 tablet, Rfl: 3   bisoprolol (ZEBETA) 5 MG tablet, TAKE 1 TABLET BY MOUTH DAILY FOR BLOOD PRESSURE, Disp: 90 tablet, Rfl: 3   buPROPion (WELLBUTRIN XL) 150 MG 24 hr tablet, TAKE 1 TABLET BY MOUTH DAILY FOR MOOD, FOCUS, AND CONCENTRATION, Disp: 90 tablet, Rfl: 3   Cholecalciferol (VITAMIN D-3) 1000 UNITS CAPS, Take 5,000 Units by mouth daily. , Disp: , Rfl:    Fluticasone-Umeclidin-Vilant (TRELEGY ELLIPTA) 100-62.5-25 MCG/ACT AEPB, Inhale 1 puff into the lungs daily., Disp: 60 each, Rfl: 4   furosemide (LASIX) 20 MG tablet, Take 20 mg every other day, Disp: 90 tablet, Rfl: 3   tamsulosin (FLOMAX) 0.4 MG CAPS capsule, Take 1 tablet at Bedtime for Prostate, Disp: 90 capsule, Rfl: 3  Past Medical History: Past Medical History:  Diagnosis Date   Acute appendicitis 11/24/2016   COPD (chronic obstructive pulmonary disease) (HCC)    GERD (gastroesophageal reflux disease)    Hematuria    HLD (hyperlipidemia)    Hypertension    IBS (irritable bowel syndrome)    Pre-diabetes    Vitamin D deficiency     Tobacco Use: Social History   Tobacco Use  Smoking Status Former   Packs/day: 1.50   Years: 35.00   Additional pack years: 0.00   Total pack years: 52.50   Types: Cigarettes   Quit date: 02/05/2004    Years since quitting: 18.9  Smokeless Tobacco Never    Labs: Review Flowsheet  More data exists      Latest Ref Rng & Units 08/20/2021 01/30/2022 05/14/2022 09/03/2022 12/22/2022  Labs for ITP Cardiac and Pulmonary Rehab  Cholestrol <200 mg/dL 409  811  914  782  956   LDL (calc) mg/dL (calc) 72  78  72  48  55   HDL-C > OR = 40 mg/dL 63  57  61  65  48   Trlycerides <150 mg/dL 91  213  086  74  72   Hemoglobin A1c <5.7 % of total Hgb - 5.5  5.6  - 5.8     Capillary Blood Glucose: No results found for: "GLUCAP"   Pulmonary Assessment Scores:  Pulmonary Assessment Scores     Row Name 11/26/22 1105         ADL UCSD   ADL Phase Entry     SOB Score total 47       CAT Score   CAT Score 20       mMRC Score   mMRC Score 1             UCSD: Self-administered rating of dyspnea associated with activities of daily living (ADLs) 6-point scale (0 = "not at all" to 5 = "maximal or unable to do because of breathlessness")  Scoring Scores range from 0 to 120.  Minimally important difference is 5 units  CAT: CAT can identify the health impairment of COPD patients and is better correlated with disease progression.  CAT has a scoring range of zero to 40. The CAT score is classified into four groups of low (less than 10), medium (10 - 20), high (21-30) and very high (31-40) based on the impact level of disease on health status. A CAT score over 10 suggests significant symptoms.  A worsening CAT score could be explained by an exacerbation, poor medication adherence, poor inhaler technique, or progression of COPD or comorbid conditions.  CAT MCID is 2 points  mMRC: mMRC (Modified Medical Research Council) Dyspnea Scale is used to assess the degree of baseline functional disability in patients of respiratory disease due to dyspnea. No minimal important difference is established. A decrease in score of 1 point or greater is considered a positive change.   Pulmonary Function Assessment:   Pulmonary Function Assessment - 11/26/22 1045       Breath   Bilateral Breath Sounds Clear    Shortness of Breath Yes;Limiting activity             Exercise Target Goals: Exercise Program Goal: Individual exercise prescription set using results from initial 6 min walk test and THRR while considering  patient's activity barriers and safety.   Exercise Prescription Goal: Initial exercise prescription builds to 30-45 minutes a day of aerobic activity, 2-3 days per week.  Home exercise guidelines will be given to patient during program as part of exercise prescription that the participant will acknowledge.  Activity Barriers & Risk Stratification:  Activity Barriers & Cardiac Risk Stratification - 11/26/22 1043       Activity Barriers & Cardiac Risk Stratification   Activity Barriers Deconditioning;Muscular Weakness;Shortness of Breath;Arthritis    Cardiac Risk Stratification Moderate             6 Minute Walk:  6 Minute Walk     Row Name 11/26/22 1146         6 Minute Walk   Phase Initial     Distance 1205 feet     Walk Time 6 minutes     # of Rest Breaks 0     MPH 2.28     METS 2.61     RPE 11     Perceived Dyspnea  1     VO2 Peak 9.12     Symptoms No     Resting HR 55 bpm     Resting BP 140/62     Resting Oxygen Saturation  96 %     Exercise Oxygen Saturation  during 6 min walk 94 %     Max Ex. HR 100 bpm     Max Ex. BP 132/62     2 Minute Post BP 118/60       Interval HR   1 Minute HR 94     2 Minute HR 100     3 Minute HR 100     4 Minute HR 97     5 Minute HR 95     6 Minute HR 99     2 Minute Post HR 61     Interval Heart Rate? Yes       Interval Oxygen   Interval Oxygen? Yes     Baseline Oxygen Saturation % 96 %     1 Minute Oxygen Saturation % 100 %     1 Minute Liters of Oxygen 0 L  2 Minute Oxygen Saturation % 97 %     2 Minute Liters of Oxygen 0 L     3 Minute Oxygen Saturation % 91 %     3 Minute Liters of Oxygen 0 L     4  Minute Oxygen Saturation % 97 %     4 Minute Liters of Oxygen 0 L     5 Minute Oxygen Saturation % 98 %     5 Minute Liters of Oxygen 0 L     6 Minute Oxygen Saturation % 94 %     6 Minute Liters of Oxygen 0 L     2 Minute Post Oxygen Saturation % 100 %     2 Minute Post Liters of Oxygen 0 L              Oxygen Initial Assessment:  Oxygen Initial Assessment - 11/26/22 1045       Home Oxygen   Home Oxygen Device None    Sleep Oxygen Prescription None    Home Exercise Oxygen Prescription None    Home Resting Oxygen Prescription None      Initial 6 min Walk   Oxygen Used None      Program Oxygen Prescription   Program Oxygen Prescription None      Intervention   Short Term Goals To learn and understand importance of maintaining oxygen saturations>88%;To learn and demonstrate proper use of respiratory medications;To learn and understand importance of monitoring SPO2 with pulse oximeter and demonstrate accurate use of the pulse oximeter.;To learn and demonstrate proper pursed lip breathing techniques or other breathing techniques. ;To learn and exhibit compliance with exercise, home and travel O2 prescription    Long  Term Goals Exhibits compliance with exercise, home  and travel O2 prescription;Maintenance of O2 saturations>88%;Compliance with respiratory medication;Verbalizes importance of monitoring SPO2 with pulse oximeter and return demonstration;Exhibits proper breathing techniques, such as pursed lip breathing or other method taught during program session;Demonstrates proper use of MDI's             Oxygen Re-Evaluation:  Oxygen Re-Evaluation     Row Name 11/26/22 1526 12/29/22 1013           Program Oxygen Prescription   Program Oxygen Prescription None None        Home Oxygen   Home Oxygen Device None None      Sleep Oxygen Prescription None None      Home Exercise Oxygen Prescription None None      Home Resting Oxygen Prescription None None         Goals/Expected Outcomes   Short Term Goals To learn and understand importance of maintaining oxygen saturations>88%;To learn and demonstrate proper use of respiratory medications;To learn and understand importance of monitoring SPO2 with pulse oximeter and demonstrate accurate use of the pulse oximeter.;To learn and demonstrate proper pursed lip breathing techniques or other breathing techniques. ;To learn and exhibit compliance with exercise, home and travel O2 prescription To learn and understand importance of maintaining oxygen saturations>88%;To learn and demonstrate proper use of respiratory medications;To learn and understand importance of monitoring SPO2 with pulse oximeter and demonstrate accurate use of the pulse oximeter.;To learn and demonstrate proper pursed lip breathing techniques or other breathing techniques. ;To learn and exhibit compliance with exercise, home and travel O2 prescription      Long  Term Goals Exhibits compliance with exercise, home  and travel O2 prescription;Maintenance of O2 saturations>88%;Compliance with respiratory medication;Verbalizes importance of monitoring SPO2 with pulse  oximeter and return demonstration;Exhibits proper breathing techniques, such as pursed lip breathing or other method taught during program session;Demonstrates proper use of MDI's Exhibits compliance with exercise, home  and travel O2 prescription;Maintenance of O2 saturations>88%;Compliance with respiratory medication;Verbalizes importance of monitoring SPO2 with pulse oximeter and return demonstration;Exhibits proper breathing techniques, such as pursed lip breathing or other method taught during program session;Demonstrates proper use of MDI's      Goals/Expected Outcomes Compliance and understanding of oxygen saturations monitoring and breathing techniques to decrease shortness of breath. Compliance and understanding of oxygen saturations monitoring and breathing techniques to decrease shortness of  breath.               Oxygen Discharge (Final Oxygen Re-Evaluation):  Oxygen Re-Evaluation - 12/29/22 1013       Program Oxygen Prescription   Program Oxygen Prescription None      Home Oxygen   Home Oxygen Device None    Sleep Oxygen Prescription None    Home Exercise Oxygen Prescription None    Home Resting Oxygen Prescription None      Goals/Expected Outcomes   Short Term Goals To learn and understand importance of maintaining oxygen saturations>88%;To learn and demonstrate proper use of respiratory medications;To learn and understand importance of monitoring SPO2 with pulse oximeter and demonstrate accurate use of the pulse oximeter.;To learn and demonstrate proper pursed lip breathing techniques or other breathing techniques. ;To learn and exhibit compliance with exercise, home and travel O2 prescription    Long  Term Goals Exhibits compliance with exercise, home  and travel O2 prescription;Maintenance of O2 saturations>88%;Compliance with respiratory medication;Verbalizes importance of monitoring SPO2 with pulse oximeter and return demonstration;Exhibits proper breathing techniques, such as pursed lip breathing or other method taught during program session;Demonstrates proper use of MDI's    Goals/Expected Outcomes Compliance and understanding of oxygen saturations monitoring and breathing techniques to decrease shortness of breath.             Initial Exercise Prescription:  Initial Exercise Prescription - 11/26/22 1100       Date of Initial Exercise RX and Referring Provider   Date 11/26/22    Referring Provider [name]    Expected Discharge Date 02/19/23      Treadmill   MPH 2.5    Grade 0    Minutes 15      Rower   Level 1    Watts 16    Minutes 15      Prescription Details   Frequency (times per week) 2    Duration Progress to 30 minutes of continuous aerobic without signs/symptoms of physical distress      Intensity   THRR 40-80% of Max Heartrate  59-118    Ratings of Perceived Exertion 11-13    Perceived Dyspnea 0-4      Progression   Progression Continue progressive overload as per policy without signs/symptoms or physical distress.      Resistance Training   Training Prescription Yes    Weight blue bands    Reps 10-15             Perform Capillary Blood Glucose checks as needed.  Exercise Prescription Changes:   Exercise Prescription Changes     Row Name 12/02/22 1500 12/16/22 1500 12/30/22 1500         Response to Exercise   Blood Pressure (Admit) 120/50 122/52 114/58     Blood Pressure (Exercise) 126/60 136/64 128/61     Blood Pressure (Exit) 108/60 98/58 104/56  Heart Rate (Admit) 60 bpm 54 bpm 58 bpm     Heart Rate (Exercise) 112 bpm 103 bpm 98 bpm     Heart Rate (Exit) 78 bpm 65 bpm 72 bpm     Oxygen Saturation (Admit) 97 % 98 % 96 %     Oxygen Saturation (Exercise) 97 % 95 % 98 %     Oxygen Saturation (Exit) 96 % 96 % 97 %     Rating of Perceived Exertion (Exercise) 15 14 11      Perceived Dyspnea (Exercise) 3 3 1      Duration Progress to 30 minutes of  aerobic without signs/symptoms of physical distress Continue with 30 min of aerobic exercise without signs/symptoms of physical distress. Continue with 30 min of aerobic exercise without signs/symptoms of physical distress.     Intensity THRR unchanged THRR unchanged THRR unchanged       Progression   Progression Continue to progress workloads to maintain intensity without signs/symptoms of physical distress. Continue to progress workloads to maintain intensity without signs/symptoms of physical distress. Continue to progress workloads to maintain intensity without signs/symptoms of physical distress.       Resistance Training   Training Prescription Yes Yes Yes     Weight blue bands blue bands blue bands     Reps 10-15 10-15 10-15     Time 10 Minutes 10 Minutes 10 Minutes       Treadmill   MPH 2.5 2.5 2.8     Grade 0 1 1     Minutes 15 15 15       METs 2.91 3.26 3.53       Rower   Level 1 2 2      Watts 13 33 33     Minutes 15 15 15               Exercise Comments:   Exercise Comments     Row Name 12/02/22 1456           Exercise Comments Pt completed first day of exercise. Will discuss METs next time. Pt exercised for 15 min on the treadmill and rower. [name] Park averaged 2.91 METs on the treadmill and 13 watts at level 1 on the rower. He performed the warmup and cooldown standing without limitations.                Exercise Goals and Review:   Exercise Goals     Row Name 11/26/22 1044 12/30/22 1252           Exercise Goals   Increase Physical Activity Yes Yes      Intervention Provide advice, education, support and counseling about physical activity/exercise needs.;Develop an individualized exercise prescription for aerobic and resistive training based on initial evaluation findings, risk stratification, comorbidities and participant's personal goals. Provide advice, education, support and counseling about physical activity/exercise needs.;Develop an individualized exercise prescription for aerobic and resistive training based on initial evaluation findings, risk stratification, comorbidities and participant's personal goals.      Expected Outcomes Short Term: Attend rehab on a regular basis to increase amount of physical activity.;Long Term: Exercising regularly at least 3-5 days a week.;Long Term: Add in home exercise to make exercise part of routine and to increase amount of physical activity. Short Term: Attend rehab on a regular basis to increase amount of physical activity.;Long Term: Exercising regularly at least 3-5 days a week.;Long Term: Add in home exercise to make exercise part of routine and to increase amount of physical activity.  Increase Strength and Stamina Yes Yes      Intervention Develop an individualized exercise prescription for aerobic and resistive training based on initial evaluation  findings, risk stratification, comorbidities and participant's personal goals.;Provide advice, education, support and counseling about physical activity/exercise needs. Develop an individualized exercise prescription for aerobic and resistive training based on initial evaluation findings, risk stratification, comorbidities and participant's personal goals.;Provide advice, education, support and counseling about physical activity/exercise needs.      Expected Outcomes Short Term: Increase workloads from initial exercise prescription for resistance, speed, and METs.;Short Term: Perform resistance training exercises routinely during rehab and add in resistance training at home;Long Term: Improve cardiorespiratory fitness, muscular endurance and strength as measured by increased METs and functional capacity ( ) Short Term: Increase workloads from initial exercise prescription for resistance, speed, and METs.;Short Term: Perform resistance training exercises routinely during rehab and add in resistance training at home;Long Term: Improve cardiorespiratory fitness, muscular endurance and strength as measured by increased METs and functional capacity ( )      Able to understand and use rate of perceived exertion (RPE) scale Yes Yes      Intervention Provide education and explanation on how to use RPE scale Provide education and explanation on how to use RPE scale      Expected Outcomes Short Term: Able to use RPE daily in rehab to express subjective intensity level;Long Term:  Able to use RPE to guide intensity level when exercising independently Short Term: Able to use RPE daily in rehab to express subjective intensity level;Long Term:  Able to use RPE to guide intensity level when exercising independently      Able to understand and use Dyspnea scale Yes Yes      Intervention Provide education and explanation on how to use Dyspnea scale Provide education and explanation on how to use Dyspnea scale       Expected Outcomes Short Term: Able to use Dyspnea scale daily in rehab to express subjective sense of shortness of breath during exertion;Long Term: Able to use Dyspnea scale to guide intensity level when exercising independently Short Term: Able to use Dyspnea scale daily in rehab to express subjective sense of shortness of breath during exertion;Long Term: Able to use Dyspnea scale to guide intensity level when exercising independently      Knowledge and understanding of Target Heart Rate Range (THRR) Yes Yes      Intervention Provide education and explanation of THRR including how the numbers were predicted and where they are located for reference Provide education and explanation of THRR including how the numbers were predicted and where they are located for reference      Expected Outcomes Short Term: Able to state/look up THRR;Long Term: Able to use THRR to govern intensity when exercising independently;Short Term: Able to use daily as guideline for intensity in rehab Short Term: Able to state/look up THRR;Long Term: Able to use THRR to govern intensity when exercising independently;Short Term: Able to use daily as guideline for intensity in rehab      Understanding of Exercise Prescription Yes Yes      Intervention Provide education, explanation, and written materials on patient's individual exercise prescription Provide education, explanation, and written materials on patient's individual exercise prescription      Expected Outcomes Short Term: Able to explain program exercise prescription;Long Term: Able to explain home exercise prescription to exercise independently Short Term: Able to explain program exercise prescription;Long Term: Able to explain home exercise prescription to exercise independently  Exercise Goals Re-Evaluation :  Exercise Goals Re-Evaluation     Row Name 11/26/22 1525 12/29/22 1009           Exercise Goal Re-Evaluation   Exercise Goals Review  Increase Physical Activity;Able to understand and use Dyspnea scale;Understanding of Exercise Prescription;Increase Strength and Stamina;Knowledge and understanding of Target Heart Rate Range (THRR);Able to understand and use rate of perceived exertion (RPE) scale Increase Physical Activity;Able to understand and use Dyspnea scale;Understanding of Exercise Prescription;Increase Strength and Stamina;Knowledge and understanding of Target Heart Rate Range (THRR);Able to understand and use rate of perceived exertion (RPE) scale      Comments Pt is scheduled to begin exercise on 6/11. Will monitor for progression. Journey has completed 7 exercise sessions. He exercises for 15 min on the treadmill and rower. [name] Park averages 3.53 METs at 2.8 mph and 1.0% incline on the treadmill and 33 watts on the rower. [name] Park performs the warmup and cooldown standing without limitations. He has increased his workload for both exercise modes as he tolerates progressions well. [name] Park seems motivated to exercise and increase his functional capacity. Will continue to monitor and progress as able      Expected Outcomes Through exercise at rehab and at home, the patient will decrease shortness of breath with daily activities and feel confident in carrying out an exercise regime at home. Through exercise at rehab and at home, the patient will decrease shortness of breath with daily activities and feel confident in carrying out an exercise regime at home.               Discharge Exercise Prescription (Final Exercise Prescription Changes):  Exercise Prescription Changes - 12/30/22 1500       Response to Exercise   Blood Pressure (Admit) 114/58    Blood Pressure (Exercise) 128/61    Blood Pressure (Exit) 104/56    Heart Rate (Admit) 58 bpm    Heart Rate (Exercise) 98 bpm    Heart Rate (Exit) 72 bpm    Oxygen Saturation (Admit) 96 %    Oxygen Saturation (Exercise) 98 %    Oxygen Saturation (Exit) 97 %    Rating of Perceived  Exertion (Exercise) 11    Perceived Dyspnea (Exercise) 1    Duration Continue with 30 min of aerobic exercise without signs/symptoms of physical distress.    Intensity THRR unchanged      Progression   Progression Continue to progress workloads to maintain intensity without signs/symptoms of physical distress.      Resistance Training   Training Prescription Yes    Weight blue bands    Reps 10-15    Time 10 Minutes      Treadmill   MPH 2.8    Grade 1    Minutes 15    METs 3.53      Rower   Level 2    Watts 33    Minutes 15             Nutrition:  Target Goals: Understanding of nutrition guidelines, daily intake of sodium 1500mg , cholesterol 200mg , calories 30% from fat and 7% or less from saturated fats, daily to have 5 or more servings of fruits and vegetables.  Biometrics:  Pre Biometrics - 11/26/22 1224       Pre Biometrics   Grip Strength 35 kg              Nutrition Therapy Plan and Nutrition Goals:  Nutrition Therapy & Goals - 12/30/22 1446  Nutrition Therapy   Diet Heart Healthy Diet    Drug/Food Interactions Statins/Certain Fruits      Personal Nutrition Goals   Nutrition Goal Patient to improve diet quality by using the plate method as a guide for meal planning to include lean protein/plant protein, fruits, vegetables, whole grains, nonfat dairy as part of a well-balanced diet.    Comments Goals in progress. [name] Park reports that he had dental surgery ~6 weeks ago (at 195#) and is awaiting lower implants; he is primarily eating soft foods/pureed foods. During the first 2 weeks post-op ,he reports weight loss of ~10#. He has now increased protein intake from soft food sources such as beans, eggs, protein powder smoothies, protein shakes, etc to aid with maintenance of lean muscle mass.  He is mindful of sodium intake. He is down 4.4# since starting with our program; indicitive of more appropriate calorie deficit at this time. His wife is a good  support. [name] Park will benefit from participation in pulmonary rehab for nutrition, exercise, and lifestyle changes.      Intervention Plan   Intervention Prescribe, educate and counsel regarding individualized specific dietary modifications aiming towards targeted core components such as weight, hypertension, lipid management, diabetes, heart failure and other comorbidities.;Nutrition handout(s) given to patient.    Expected Outcomes Short Term Goal: Understand basic principles of dietary content, such as calories, fat, sodium, cholesterol and nutrients.;Long Term Goal: Adherence to prescribed nutrition plan.             Nutrition Assessments:  Nutrition Assessments - 12/02/22 1451       Rate Your Plate Scores   Pre Score 66            MEDIFICTS Score Key: ?70 Need to make dietary changes  40-70 Heart Healthy Diet ? 40 Therapeutic Level Cholesterol Diet  Flowsheet Row PULMONARY REHAB OTHER RESPIRATORY from 12/02/2022 in [facility]  Picture Your Plate Total Score on Admission 66      Picture Your Plate Scores: <16 Unhealthy dietary pattern with much room for improvement. 41-50 Dietary pattern unlikely to meet recommendations for good health and room for improvement. 51-60 More healthful dietary pattern, with some room for improvement.  >60 Healthy dietary pattern, although there may be some specific behaviors that could be improved.    Nutrition Goals Re-Evaluation:  Nutrition Goals Re-Evaluation     Row Name 12/02/22 1438 12/30/22 1446           Goals   Current Weight 185 lb 3 oz (84 kg) 182 lb 8.7 oz (82.8 kg)      Comment Lipids WNL, A1c WNL, Cr 1.49, GFR 49 A1c 5.8, Cr 1.52, GFR 48      Expected Outcome Tam reports that he had dental surgery ~2 weeks ago (at 195#) and is awaiting lower implants; he is primarily eating soft foods/pureed foods. He reports weight loss of ~10# over the last two weeks; we  discussed increasing calories from protein and implementing protein supplements. He is interested in some weight loss. Historically, he reports preferring meat and carbohydrate foods. He is mindful of sodium intake. His wife is a good support. [name] Park will benefit from participation in pulmonary rehab for nutrition, exercise, and lifestyle changes. Goals in progress. Emory reports that he had dental surgery ~6 weeks ago (at 195#) and is awaiting lower implants; he is primarily eating soft foods/pureed foods. During the first 2 weeks post-op ,he reports weight loss of ~10#.  He has now increased protein intake from soft food sources such as beans, eggs, protein powder smoothies, protein shakes, etc to aid with maintenance of lean muscle mass. He is mindful of sodium intake. He is down 4.4# since starting with our program; indicitive of more appropriate calorie deficit at this time. His wife is a good support. Janathan will benefit from participation in pulmonary rehab for nutrition, exercise, and lifestyle changes.               Nutrition Goals Discharge (Final Nutrition Goals Re-Evaluation):  Nutrition Goals Re-Evaluation - 12/30/22 1446       Goals   Current Weight 182 lb 8.7 oz (82.8 kg)    Comment A1c 5.8, Cr 1.52, GFR 48    Expected Outcome Goals in progress. [name] Park reports that he had dental surgery ~6 weeks ago (at 195#) and is awaiting lower implants; he is primarily eating soft foods/pureed foods. During the first 2 weeks post-op ,he reports weight loss of ~10#. He has now increased protein intake from soft food sources such as beans, eggs, protein powder smoothies, protein shakes, etc to aid with maintenance of lean muscle mass. He is mindful of sodium intake. He is down 4.4# since starting with our program; indicitive of more appropriate calorie deficit at this time. His wife is a good support. [name] Park will benefit from participation in pulmonary rehab for nutrition, exercise, and lifestyle changes.              Psychosocial: Target Goals: Acknowledge presence or absence of significant depression and/or stress, maximize coping skills, provide positive support system. Participant is able to verbalize types and ability to use techniques and skills needed for reducing stress and depression.  Initial Review & Psychosocial Screening:  Initial Psych Review & Screening - 11/26/22 1040       Initial Review   Current issues with None Identified      Family Dynamics   Good Support System? Yes      Barriers   Psychosocial barriers to participate in program There are no identifiable barriers or psychosocial needs.      Screening Interventions   Interventions Encouraged to exercise             Quality of Life Scores:  Scores of 19 and below usually indicate a poorer quality of life in these areas.  A difference of  2-3 points is a clinically meaningful difference.  A difference of 2-3 points in the total score of the Quality of Life Index has been associated with significant improvement in overall quality of life, self-image, physical symptoms, and general health in studies assessing change in quality of life.  PHQ-9: Review Flowsheet  More data exists      11/26/2022 09/03/2022 01/29/2022 08/20/2021 11/27/2020  Depression screen PHQ 2/9  Decreased Interest 1 0 0 0 0  Down, Depressed, Hopeless 1 0 0 0 0  PHQ - 2 Score 2 0 0 0 0  Altered sleeping 0 - - - -  Tired, decreased energy 0 - - - -  Change in appetite 0 - - - -  Feeling bad or failure about yourself  0 - - - -  Trouble concentrating 1 - - - -  Moving slowly or fidgety/restless 1 - - - -  PHQ-9 Score 4 - - - -  Difficult doing work/chores Somewhat difficult - - - -   Interpretation of Total Score  Total Score Depression Severity:  1-4 = Minimal depression, 5-9 =  Mild depression, 10-14 = Moderate depression, 15-19 = Moderately severe depression, 20-27 = Severe depression   Psychosocial Evaluation and Intervention:   Psychosocial Evaluation - 11/26/22 1041       Psychosocial Evaluation & Interventions   Interventions Encouraged to exercise with the program and follow exercise prescription    Comments [name] Park denies any psychosocial barriers or concerns at this time.    Expected Outcomes For [name] Park to participate in PR free of any psychosocial barriers or concerns    Continue Psychosocial Services  No Follow up required             Psychosocial Re-Evaluation:  Psychosocial Re-Evaluation     Row Name 11/28/22 0816 12/26/22 1606           Psychosocial Re-Evaluation   Current issues with None Identified None Identified      Comments No new psychosocial barriers or concerns since orientation on 6/5. Pt scheduled to start on 6/11 [name] Park denies any psychosocial barriers or concerns.      Expected Outcomes For [name] Park to participate in PR free of any psychosocial barriers or concerns For [name] Park to continue to attend PR free of any psychosocial barriers or concerns      Interventions Encouraged to attend Pulmonary Rehabilitation for the exercise Encouraged to attend Pulmonary Rehabilitation for the exercise      Continue Psychosocial Services  No Follow up required No Follow up required               Psychosocial Discharge (Final Psychosocial Re-Evaluation):  Psychosocial Re-Evaluation - 12/26/22 1606       Psychosocial Re-Evaluation   Current issues with None Identified    Comments [name] Park denies any psychosocial barriers or concerns.    Expected Outcomes For [name] Park to continue to attend PR free of any psychosocial barriers or concerns    Interventions Encouraged to attend Pulmonary Rehabilitation for the exercise    Continue Psychosocial Services  No Follow up required             Education: Education Goals: Education classes will be provided on a weekly basis, covering required topics. Participant will state understanding/return demonstration of topics presented.  Learning  Barriers/Preferences:  Learning Barriers/Preferences - 11/26/22 1042       Learning Barriers/Preferences   Learning Barriers None    Learning Preferences Group Instruction;Written Material             Education Topics: Introduction to Pulmonary Rehab Group instruction provided by PowerPoint, verbal discussion, and written material to support subject matter. Instructor reviews what Pulmonary Rehab is, the purpose of the program, and how patients are referred.     Know Your Numbers Group instruction that is supported by a PowerPoint presentation. Instructor discusses importance of knowing and understanding resting, exercise, and post-exercise oxygen saturation, heart rate, and blood pressure. Oxygen saturation, heart rate, blood pressure, rating of perceived exertion, and dyspnea are reviewed along with a normal range for these values.    Exercise for the Pulmonary Patient Group instruction that is supported by a PowerPoint presentation. Instructor discusses benefits of exercise, core components of exercise, frequency, duration, and intensity of an exercise routine, importance of utilizing pulse oximetry during exercise, safety while exercising, and options of places to exercise outside of rehab.       MET Level  Group instruction provided by PowerPoint, verbal discussion, and written material to support subject matter. Instructor reviews what METs are and how to increase METs.    Pulmonary Medications Verbally interactive  group education provided by instructor with focus on inhaled medications and proper administration. Flowsheet Row PULMONARY REHAB OTHER RESPIRATORY from 12/11/2022 in [facility] for Heart, Vascular, & Lung Health  Date 12/11/22  Educator RT  Instruction Review Code 1- Verbalizes Understanding       Anatomy and Physiology of the Respiratory System Group instruction provided by PowerPoint, verbal discussion, and written material to  support subject matter. Instructor reviews respiratory cycle and anatomical components of the respiratory system and their functions. Instructor also reviews differences in obstructive and restrictive respiratory diseases with examples of each.  Flowsheet Row PULMONARY REHAB OTHER RESPIRATORY from 12/04/2022 in [facility] for Heart, Vascular, & Lung Health  Date 12/04/22  Educator RT  Instruction Review Code 1- Verbalizes Understanding       Oxygen Safety Group instruction provided by PowerPoint, verbal discussion, and written material to support subject matter. There is an overview of "What is Oxygen" and "Why do we need it".  Instructor also reviews how to create a safe environment for oxygen use, the importance of using oxygen as prescribed, and the risks of noncompliance. There is a brief discussion on traveling with oxygen and resources the patient may utilize.   Oxygen Use Group instruction provided by PowerPoint, verbal discussion, and written material to discuss how supplemental oxygen is prescribed and different types of oxygen supply systems. Resources for more information are provided.    Breathing Techniques Group instruction that is supported by demonstration and informational handouts. Instructor discusses the benefits of pursed lip and diaphragmatic breathing and detailed demonstration on how to perform both.  Flowsheet Row PULMONARY REHAB OTHER RESPIRATORY from 12/18/2022 in [facility] for Heart, Vascular, & Lung Health  Date 12/18/22  Educator EP  Instruction Review Code 1- Verbalizes Understanding        Risk Factor Reduction Group instruction that is supported by a PowerPoint presentation. Instructor discusses the definition of a risk factor, different risk factors for pulmonary disease, and how the heart and lungs work together.   MD Day A group question and answer session with a medical doctor that allows  participants to ask questions that relate to their pulmonary disease state.   Nutrition for the Pulmonary Patient Group instruction provided by PowerPoint slides, verbal discussion, and written materials to support subject matter. The instructor gives an explanation and review of healthy diet recommendations, which includes a discussion on weight management, recommendations for fruit and vegetable consumption, as well as protein, fluid, caffeine, fiber, sodium, sugar, and alcohol. Tips for eating when patients are short of breath are discussed.    Other Education Group or individual verbal, written, or video instructions that support the educational goals of the pulmonary rehab program.    Knowledge Questionnaire Score:  Knowledge Questionnaire Score - 11/26/22 1105       Knowledge Questionnaire Score   Pre Score 14/18             Core Components/Risk Factors/Patient Goals at Admission:  Personal Goals and Risk Factors at Admission - 11/26/22 1042       Core Components/Risk Factors/Patient Goals on Admission    Weight Management Weight Loss    Improve shortness of breath with ADL's Yes    Intervention Provide education, individualized exercise plan and daily activity instruction to help decrease symptoms of SOB with activities of daily living.    Expected Outcomes Short Term: Improve cardiorespiratory fitness to achieve a reduction of symptoms when performing  ADLs             Core Components/Risk Factors/Patient Goals Review:   Goals and Risk Factor Review     Row Name 11/28/22 0818 12/26/22 1608           Core Components/Risk Factors/Patient Goals Review   Personal Goals Review Weight Management/Obesity;Develop more efficient breathing techniques such as purse lipped breathing and diaphragmatic breathing and practicing self-pacing with activity.;Improve shortness of breath with ADL's Weight Management/Obesity;Develop more efficient breathing techniques such as purse  lipped breathing and diaphragmatic breathing and practicing self-pacing with activity.;Improve shortness of breath with ADL's      Review Cannot assess pt's goals at this time due to pt scheduled to start on 6/11. We will continue to follw pt. Goal in progress for weight loss. [name] Park has been working with our dietician for weight loss, he has lost weight since starting. [name] Park is going through dental implants and has been on a soft diet. Goal met on developing more efficient breathing techniques such as purse lipped breathing and diaphragmatic breathing; and practicing self-pacing with activity. Goal progressing on improving his shortness of breath with ADLs. [name] Park has made improvement in the class and states that he can see a change in his breathing at home. [name] Park will continue to benefit from participation in PR for nutrition, education, exercise and lifestyle modification.      Expected Outcomes See admission goals See admission goals               Core Components/Risk Factors/Patient Goals at Discharge (Final Review):   Goals and Risk Factor Review - 12/26/22 1608       Core Components/Risk Factors/Patient Goals Review   Personal Goals Review Weight Management/Obesity;Develop more efficient breathing techniques such as purse lipped breathing and diaphragmatic breathing and practicing self-pacing with activity.;Improve shortness of breath with ADL's    Review Goal in progress for weight loss. [name] Park has been working with our dietician for weight loss, he has lost weight since starting. [name] Park is going through dental implants and has been on a soft diet. Goal met on developing more efficient breathing techniques such as purse lipped breathing and diaphragmatic breathing; and practicing self-pacing with activity. Goal progressing on improving his shortness of breath with ADLs. [name] Park has made improvement in the class and states that he can see a change in his breathing at home. [name] Park will continue to  benefit from participation in PR for nutrition, education, exercise and lifestyle modification.    Expected Outcomes See admission goals             ITP Comments: Pt is making expected progress toward Pulmonary Rehab goals after completing 8 sessions. Recommend continued exercise, life style modification, education, and utilization of breathing techniques to increase stamina and strength, while also decreasing shortness of breath with exertion.  Dr. Mechele Collin is Medical Director for Pulmonary Rehab at [facility].

## 2023-01-01 ENCOUNTER — Encounter (HOSPITAL_COMMUNITY)
Admission: RE | Admit: 2023-01-01 | Discharge: 2023-01-01 | Disposition: A | Discharge status: Home / Self Care | Payer: Medicare Other | Source: Ambulatory Visit | Attending: Pulmonary Disease | Admitting: Pulmonary Disease

## 2023-01-01 DIAGNOSIS — J984 Other disorders of lung: Secondary | ICD-10-CM

## 2023-01-01 NOTE — Progress Notes (Signed)
Daily Session Note  Patient Details  Name: [name] Park MRN: [id number] Date of Birth: [birth year] Referring Provider:   Doristine Devoid Pulmonary Rehab Walk Test from 11/26/2022 in [facility] for Heart, Vascular, & Lung Health  Referring Provider [name]       Encounter Date: 01/01/2023  Check In:  Session Check In - 01/01/23 1404       Check-In   Supervising physician immediately available to respond to emergencies [facility] MD immediately available    Physician(s) Micah Flesher, NP    Location [facility]    Staff Present Samantha Belarus, RD, Dutch Gray, RN, BSN;Randi Reeve BS, ACSM-CEP, Exercise Physiologist;Jetta Dan Humphreys BS, ACSM-CEP, Exercise Physiologist;Kaylee Earlene Plater, MS, ACSM-CEP, Exercise Physiologist;Stephanee Barcomb Katrinka Blazing, RT    Virtual Visit No    Medication changes reported     No    Fall or balance concerns reported    No    Tobacco Cessation No Change    Warm-up and Cool-down Performed as group-led instruction    Resistance Training Performed Yes    VAD Patient? No    PAD/SET Patient? No      Pain Assessment   Currently in Pain? No/denies    Multiple Pain Sites No             Capillary Blood Glucose: No results found for this or any previous visit (from the past 24 hour(s)).    Social History   Tobacco Use  Smoking Status Former   Current packs/day: 0.00   Average packs/day: 1.5 packs/day for 35.0 years (52.5 ttl pk-yrs)   Types: Cigarettes   Start date: 02/04/1969   Quit date: 02/05/2004   Years since quitting: 18.9  Smokeless Tobacco Never    Goals Met:  Proper associated with RPD/PD & O2 Sat Independence with exercise equipment Exercise tolerated well No report of concerns or symptoms today Strength training completed today  Goals Unmet:  Not Applicable  Comments: Service time is from 1310 to 1444.    Dr. Mechele Collin is Medical Director for Pulmonary Rehab at [facility].

## 2023-01-06 ENCOUNTER — Encounter (HOSPITAL_COMMUNITY)
Admission: RE | Admit: 2023-01-06 | Discharge: 2023-01-06 | Disposition: A | Discharge status: Home / Self Care | Payer: Medicare Other | Source: Ambulatory Visit | Attending: Pulmonary Disease | Admitting: Pulmonary Disease

## 2023-01-06 DIAGNOSIS — J984 Other disorders of lung: Secondary | ICD-10-CM

## 2023-01-06 NOTE — Progress Notes (Signed)
Daily Session Note  Patient Details  Name: [name] Park MRN: [id number] Date of Birth: [birth year] Referring Provider:   Doristine Devoid Pulmonary Rehab Walk Test from 11/26/2022 in [facility] for Heart, Vascular, & Lung Health  Referring Provider [name]       Encounter Date: 01/06/2023  Check In:  Session Check In - 01/06/23 1328       Check-In   Supervising physician immediately available to respond to emergencies [facility] MD immediately available    Physician(s) Edd Fabian, NP    Location [facility]    Staff Present Samantha Belarus, RD, Dutch Gray, RN, BSN;Randi Reeve BS, ACSM-CEP, Exercise Physiologist;Kaylee Earlene Plater, MS, ACSM-CEP, Exercise Physiologist;Casey Katrinka Blazing, RT    Virtual Visit No    Medication changes reported     No    Fall or balance concerns reported    No    Tobacco Cessation No Change    Warm-up and Cool-down Performed as group-led instruction    Resistance Training Performed Yes    VAD Patient? No    PAD/SET Patient? No      Pain Assessment   Currently in Pain? No/denies    Multiple Pain Sites No             Capillary Blood Glucose: No results found for this or any previous visit (from the past 24 hour(s)).    Social History   Tobacco Use  Smoking Status Former   Current packs/day: 0.00   Average packs/day: 1.5 packs/day for 35.0 years (52.5 ttl pk-yrs)   Types: Cigarettes   Start date: 02/04/1969   Quit date: 02/05/2004   Years since quitting: 18.9  Smokeless Tobacco Never    Goals Met:  Independence with exercise equipment Exercise tolerated well No report of concerns or symptoms today Strength training completed today  Goals Unmet:  Not Applicable  Comments: Service time is from 1316 to 1445    Dr. Mechele Collin is Medical Director for Pulmonary Rehab at [facility].

## 2023-01-08 ENCOUNTER — Encounter (HOSPITAL_COMMUNITY)
Admission: RE | Admit: 2023-01-08 | Discharge: 2023-01-08 | Disposition: A | Discharge status: Home / Self Care | Payer: Medicare Other | Source: Ambulatory Visit | Attending: Pulmonary Disease | Admitting: Pulmonary Disease

## 2023-01-08 DIAGNOSIS — J984 Other disorders of lung: Secondary | ICD-10-CM

## 2023-01-08 NOTE — Progress Notes (Signed)
Daily Session Note  Patient Details  Name: [name] Park MRN: [id number] Date of Birth: [birth year] Referring Provider:   Doristine Devoid Pulmonary Rehab Walk Test from 11/26/2022 in [facility] for Heart, Vascular, & Lung Health  Referring Provider [name]       Encounter Date: 01/08/2023  Check In:  Session Check In - 01/08/23 1320       Check-In   Supervising physician immediately available to respond to emergencies [facility] MD immediately available    Physician(s) Bernadene Person, NP    Location [facility]    Staff Present Samantha Belarus, RD, Dutch Gray, RN, BSN;Randi Reeve BS, ACSM-CEP, Exercise Physiologist;Suhayb Anzalone Earlene Plater, MS, ACSM-CEP, Exercise Physiologist;Casey Chester Holstein, MS, Exercise Physiologist    Virtual Visit No    Medication changes reported     No    Fall or balance concerns reported    No    Tobacco Cessation No Change    Warm-up and Cool-down Performed as group-led instruction    Resistance Training Performed Yes    VAD Patient? No    PAD/SET Patient? No      Pain Assessment   Currently in Pain? No/denies    Multiple Pain Sites No             Capillary Blood Glucose: No results found for this or any previous visit (from the past 24 hour(s)).    Social History   Tobacco Use  Smoking Status Former   Current packs/day: 0.00   Average packs/day: 1.5 packs/day for 35.0 years (52.5 ttl pk-yrs)   Types: Cigarettes   Start date: 02/04/1969   Quit date: 02/05/2004   Years since quitting: 18.9  Smokeless Tobacco Never    Goals Met:  Proper associated with RPD/PD & O2 Sat Independence with exercise equipment Exercise tolerated well No report of concerns or symptoms today Strength training completed today  Goals Unmet:  Not Applicable  Comments: Service time is from 1309 to 1450.    Dr. Mechele Collin is Medical Director for Pulmonary Rehab at  [facility].

## 2023-01-09 ENCOUNTER — Ambulatory Visit: Payer: Medicare Other | Attending: Cardiology | Admitting: Cardiology

## 2023-01-09 ENCOUNTER — Encounter: Payer: Self-pay | Admitting: Cardiology

## 2023-01-09 VITALS — BP 128/66 | HR 76 | Ht 69.0 in | Wt 182.0 lb

## 2023-01-09 DIAGNOSIS — I2510 Atherosclerotic heart disease of native coronary artery without angina pectoris: Secondary | ICD-10-CM

## 2023-01-09 DIAGNOSIS — E7800 Pure hypercholesterolemia, unspecified: Secondary | ICD-10-CM

## 2023-01-09 DIAGNOSIS — I2584 Coronary atherosclerosis due to calcified coronary lesion: Secondary | ICD-10-CM

## 2023-01-09 DIAGNOSIS — R600 Localized edema: Secondary | ICD-10-CM

## 2023-01-09 DIAGNOSIS — N1831 Chronic kidney disease, stage 3a: Secondary | ICD-10-CM

## 2023-01-09 DIAGNOSIS — R0602 Shortness of breath: Secondary | ICD-10-CM

## 2023-01-09 DIAGNOSIS — J4489 Other specified chronic obstructive pulmonary disease: Secondary | ICD-10-CM | POA: Diagnosis not present

## 2023-01-09 NOTE — Patient Instructions (Signed)
Medication Instructions:  Start Aspirin 81 mg daily *If you need a refill on your cardiac medications before your next appointment, please call your pharmacy*   Lab Work: No at this time. If you have labs (blood work) drawn today and your tests are completely normal, you will receive your results only by: MyChart Message (if you have MyChart) OR A paper copy in the mail If you have any lab test that is abnormal or we need to change your treatment, we will call you to review the results.   Testing/Procedures: None at this time.   Follow-Up: At [facility], you and your health needs are our priority.  As part of our continuing mission to provide you with exceptional heart care, we have created designated Provider Care Teams.  These Care Teams include your primary Cardiologist (physician) and Advanced Practice Providers (APPs -  Physician Assistants and Nurse Practitioners) who all work together to provide you with the care you need, when you need it.  We recommend signing up for the patient portal called "MyChart".  Sign up information is provided on this After Visit Summary.  MyChart is used to connect with patients for Virtual Visits (Telemedicine).  Patients are able to view lab/test results, encounter notes, upcoming appointments, etc.  Non-urgent messages can be sent to your provider as well.   To learn more about what you can do with MyChart, go to [email or web address].    Your next appointment:   6 month(s)  Provider:   Thurmon Fair, MD  or an APP

## 2023-01-09 NOTE — Progress Notes (Signed)
Cardiology Office Note:  .   Date:  01/09/2023  ID:  [name] Park, DOB 05-Jan-1951, MRN [id number] PCP: Lucky Cowboy, MD  [facility] Providers Cardiologist:  Thurmon Fair, MD  History of Present Illness: .   [name] Park is a 72 y.o. male with a past medical history of HTN, prediabetes, HLD, COPD, CKD stage III, history of right carpal tunnel release. Patient is followed by Dr. Royann Shivers and presents today for a 6 month follow up appointment   Per chart review, patient first established care with Dr. Royann Shivers in 05/2022 for evaluation of dyspnea and edema. Underwent echocardiogram on 06/13/22 that showed EF 60-65%, no regional wall motion abnormalities, normal LV diastolic parameters, normal RV systolic function, normal pulmonary artery systolic pressure. BNP was only 32. He was started on lasix 20 mg daily. However, after taking several days of lasix, his creatinine elevated (1.75 versus baseline of 1.5)   He also complained of lower extremity claudication and underwent ABIs that were normal. Lower extremity arterial ultrasounds showed only minimal atherosclerosis bilaterally.   Of note, patient also had coronary/aortic atherosclerosis that had been noted on multiple imaging studies. His echocardiogram and BMP suggested that his dyspnea on exertion was not from a cardiac source. He underwent coronary CTA to make sure that his exertional dyspnea was not an angina equivalent. Coronary CTA on 07/01/22 that showed a coronary calcium score of 270 (56th percentile), mild atherosclerosis of the LAD and Lcx. Study was sent for FFR that demonstrated no hemodynamically flow limiting lesions. Study also mentioned some scattered scarring in the lungs, and patient underwent PFTs that demonstrated a mild-moderate reduction in lung capacity and airway obstruction suggestive of lung disease. Overall, study was suggestive of COPD and patient was referred to Pulmonology. He is now participating in  pulmonary rehab   On interview, patient reports that he has been doing well from a cardiac perspective. He denies chest pain, palpitations, syncope, near syncope, dizziness. Has mild ankle edema, but it is well controlled with lasix 20 mg BID. He has been seeing pulmonology and is no some new inhalers that are working well. He has also been participating in pulmonary rehab, and feels like he is getting a lot of benefit from the program. He really enjoys pulmonary rehab. Able to walk on the treadmill for 15 minutes and use the rowing machine for 15 minutes. Patient stopped taking ASA because he had developed bruising that took a long time to heal   ROS: No chest pain, palpitations, dizziness, syncope, near syncope. No  lightheadedness upon standing. No abdominal distention   Studies Reviewed: .   Cardiac Studies & Procedures       ECHOCARDIOGRAM  ECHOCARDIOGRAM COMPLETE 06/13/2022  Narrative ECHOCARDIOGRAM REPORT    Patient Name:   [name] Park Date of Exam: 06/13/2022 Medical Rec #:  [id number]       Height:       69.0 in Accession #:    [id number]      Weight:       192.8 lb Date of Birth:  [birth year]      BSA:          2.034 m Patient Age:    71 years        BP:           130/64 mmHg Patient Gender: M               HR:  61 bpm. Exam Location:  [address]  Procedure: 2D Echo, Cardiac Doppler, Color Doppler and 3D Echo  Indications:    Dyspnea R06.00  History:        Patient has no prior history of Echocardiogram examinations. COPD; Risk Factors:Hypertension and Dyslipidemia.  Sonographer:    Thurman Coyer RDCS Referring Phys: [phone] MIHAI CROITORU  IMPRESSIONS   1. Left ventricular ejection fraction, by estimation, is 60 to 65%. The left ventricle has normal function. The left ventricle has no regional wall motion abnormalities. Left ventricular diastolic parameters were normal. 2. Right ventricular systolic function is normal. The right ventricular size is  normal. There is normal pulmonary artery systolic pressure. 3. The mitral valve is normal in structure. Trivial mitral valve regurgitation. No evidence of mitral stenosis. 4. The aortic valve is tricuspid. Aortic valve regurgitation is not visualized. No aortic stenosis is present. 5. The inferior vena cava is normal in size with greater than 50% respiratory variability, suggesting right atrial pressure of 3 mmHg.  Comparison(s): No prior Echocardiogram.  FINDINGS Left Ventricle: Left ventricular ejection fraction, by estimation, is 60 to 65%. The left ventricle has normal function. The left ventricle has no regional wall motion abnormalities. The left ventricular internal cavity size was normal in size. There is no left ventricular hypertrophy. Left ventricular diastolic parameters were normal.  Right Ventricle: The right ventricular size is normal. Right ventricular systolic function is normal. There is normal pulmonary artery systolic pressure. The tricuspid regurgitant velocity is 2.53 m/s, and with an assumed right atrial pressure of 3 mmHg, the estimated right ventricular systolic pressure is 28.6 mmHg.  Left Atrium: Left atrial size was normal in size.  Right Atrium: Right atrial size was normal in size.  Pericardium: There is no evidence of pericardial effusion.  Mitral Valve: The mitral valve is normal in structure. Trivial mitral valve regurgitation. No evidence of mitral valve stenosis.  Tricuspid Valve: The tricuspid valve is normal in structure. Tricuspid valve regurgitation is mild . No evidence of tricuspid stenosis.  Aortic Valve: The aortic valve is tricuspid. Aortic valve regurgitation is not visualized. No aortic stenosis is present.  Pulmonic Valve: The pulmonic valve was normal in structure. Pulmonic valve regurgitation is trivial. No evidence of pulmonic stenosis.  Aorta: The aortic root is normal in size and structure.  Venous: The inferior vena cava is normal in  size with greater than 50% respiratory variability, suggesting right atrial pressure of 3 mmHg.  IAS/Shunts: No atrial level shunt detected by color flow Doppler.   LEFT VENTRICLE PLAX 2D LVIDd:         4.40 cm   Diastology LVIDs:         2.90 cm   LV e' medial:    7.62 cm/s LV PW:         0.90 cm   LV E/e' medial:  12.2 LV IVS:        1.00 cm   LV e' lateral:   8.27 cm/s LVOT diam:     2.00 cm   LV E/e' lateral: 11.3 LV SV:         71 LV SV Index:   35 LVOT Area:     3.14 cm  3D Volume EF: 3D EF:        65 % LV EDV:       110 ml LV ESV:       38 ml LV SV:        72 ml  RIGHT VENTRICLE  RV Basal diam:  2.90 cm RV Mid diam:    2.70 cm RV S prime:     11.90 cm/s TAPSE (M-mode): 2.0 cm  LEFT ATRIUM             Index        RIGHT ATRIUM           Index LA diam:        4.10 cm 2.02 cm/m   RA Area:     11.90 cm LA Vol (A2C):   41.2 ml 20.26 ml/m  RA Volume:   20.80 ml  10.23 ml/m LA Vol (A4C):   47.8 ml 23.50 ml/m LA Biplane Vol: 45.0 ml 22.12 ml/m AORTIC VALVE LVOT Vmax:   89.60 cm/s LVOT Vmean:  58.200 cm/s LVOT VTI:    0.226 m  AORTA Ao Root diam: 3.00 cm Ao Asc diam:  3.50 cm  MITRAL VALVE               TRICUSPID VALVE MV Area (PHT): 3.60 cm    TR Peak grad:   25.6 mmHg MV Decel Time: 211 msec    TR Vmax:        253.00 cm/s MV E velocity: 93.30 cm/s MV A velocity: 74.90 cm/s  SHUNTS MV E/A ratio:  1.25        Systemic VTI:  0.23 m Systemic Diam: 2.00 cm  Olga Millers MD Electronically signed by Olga Millers MD Signature Date/Time: 06/13/2022/2:37:32 PM    Final     CT SCANS  CT CORONARY MORPH W/CTA COR W/SCORE 07/01/2022  Addendum 07/02/2022  8:59 AM ADDENDUM REPORT: 07/02/2022 08:57  EXAM: OVER-READ INTERPRETATION  CT CHEST  The following report is an over-read performed by radiologist Dr. Alver Fisher [facility] on 07/02/2022. This over-read does not include interpretation of cardiac or coronary anatomy or pathology. The  interpretation by the cardiologist is attached.  COMPARISON:  CT chest 11/23/2018.  FINDINGS: Scout view is unremarkable. Scattered pulmonary parenchymal scarring. Small hiatal hernia. Calcified granulomas.  IMPRESSION: 1. No acute extracardiac findings. 2. Small hiatal hernia.   Electronically Signed By: Leanna Battles M.D. On: 07/02/2022 08:57  Narrative CLINICAL DATA:  Chest pain  EXAM: Cardiac/Coronary CTA  TECHNIQUE: A non-contrast, gated CT scan was obtained with axial slices of 3 mm through the heart for calcium scoring. Calcium scoring was performed using the Agatston method. A 120 kV prospective, gated, contrast cardiac scan was obtained. Gantry rotation speed was 250 msecs and collimation was 0.6 mm. Two sublingual nitroglycerin tablets (0.8 mg) were given. The 3D data set was reconstructed in 5% intervals of the 35-75% of the R-R cycle. Diastolic phases were analyzed on a dedicated workstation using MPR, MIP, and VRT modes. The patient received 95 cc of contrast.  FINDINGS: Image quality: Excellent.  Noise artifact is: Limited.  Coronary Arteries:  Normal coronary origin.  Left  dominance.  Left main: The left main is a large caliber vessel with a normal take off from the left coronary cusp that bifurcates to form a left anterior descending artery and a left circumflex artery. There is no plaque or stenosis.  Left anterior descending artery: The LAD gives a large branching. There is mild calcified plaque in the proximal and mid LAD with associated stenosis of 25-49%. The diagonal is diffusely diseased in the proximal and mid portion with mild to moderate calcified plaque with associated stenosis of at least 25-49% but could be >50%. This may be overestimated due  to blooming artifact.  Left circumflex artery: The LCX is dominant and gives off 2 patent obtuse marginal branches, a left PLA and left PDA branches. There is mild calcified plaque in the  mid LCx with associated stenosis of 25-49%. There is minimal calcified plaque in the LPDA with associated stenosis of < 25%.  Right coronary artery: The RCA is very small and non dominant with no evidence of plaque or stenosis.  Right Atrium: Right atrial size is within normal limits.  Right Ventricle: The right ventricular cavity is within normal limits.  Left Atrium: Left atrial size is normal in size with no left atrial appendage filling defect.  Left Ventricle: The ventricular cavity size is within normal limits. There are no stigmata of prior infarction. There is no abnormal filling defect.  Pulmonary arteries: Normal in size without proximal filling defect.  Pulmonary veins: Normal pulmonary venous drainage.  Pericardium: Normal thickness with no significant effusion or calcium present.  Cardiac valves: The aortic valve is trileaflet without significant calcification. The mitral valve is normal structure without significant calcification.  Aorta: Normal caliber with no significant disease.  Extra-cardiac findings: See attached radiology report for non-cardiac structures.  IMPRESSION: 1. Coronary calcium score of 270. This was 56th percentile for age-, sex, and race-matched controls.  2.  Normal coronary origin with left dominance.  3.  Mild atherosclerosis of the LAD and LCx.  CAD RADS 2.  4.  This study has been submitted for FFR flow analysis.  5.  Recommend preventive therapy and risk factor modification.  RECOMMENDATIONS: 1. CAD-RADS 0: No evidence of CAD (0%). Consider non-atherosclerotic causes of chest pain.  2. CAD-RADS 1: Minimal non-obstructive CAD (0-24%). Consider non-atherosclerotic causes of chest pain. Consider preventive therapy and risk factor modification.  3. CAD-RADS 2: Mild non-obstructive CAD (25-49%). Consider non-atherosclerotic causes of chest pain. Consider preventive therapy and risk factor modification.  4. CAD-RADS 3:  Moderate stenosis. Consider symptom-guided anti-ischemic pharmacotherapy as well as risk factor modification per guideline directed care. Additional analysis with CT FFR will be submitted.  5. CAD-RADS 4: Severe stenosis. (70-99% or > 50% left main). Cardiac catheterization or CT FFR is recommended. Consider symptom-guided anti-ischemic pharmacotherapy as well as risk factor modification per guideline directed care. Invasive coronary angiography recommended with revascularization per published guideline statements.  6. CAD-RADS 5: Total coronary occlusion (100%). Consider cardiac catheterization or viability assessment. Consider symptom-guided anti-ischemic pharmacotherapy as well as risk factor modification per guideline directed care.  7. CAD-RADS N: Non-diagnostic study. Obstructive CAD can't be excluded. Alternative evaluation is recommended.  Armanda Magic, MD  Electronically Signed: By: Armanda Magic M.D. On: 07/01/2022 21:03          Risk Assessment/Calculations:             Physical Exam:   VS:  BP 128/66   Pulse 76   Ht 5\' 9"  (1.753 m)   Wt 182 lb (82.6 kg)   SpO2 98%   BMI 26.88 kg/m    Wt Readings from Last 3 Encounters:  01/09/23 182 lb (82.6 kg)  12/30/22 182 lb 8.7 oz (82.8 kg)  12/22/22 183 lb 9.6 oz (83.3 kg)    GEN: Well nourished, well developed in no acute distress. Sitting comfortably on the exam table  NECK: No JVD; No carotid bruits CARDIAC: RRR, no murmurs, rubs, gallops. Radial pulses 2+ bilaterally  RESPIRATORY:  Clear to auscultation without rales, wheezing or rhonchi. Normal work of breathing on room air   ABDOMEN: Soft, non-tender,  non-distended EXTREMITIES:  1+ edema in BLE; No deformity   ASSESSMENT AND PLAN: .    Dyspnea on Exertion  COPD - Patient first saw Dr. Royann Shivers in 05/2022 for evaluation of DOE. BNP at that visit was only 32. Echocardiogram 05/2022 showed EF 60-65%, normal LV diastolic parameters, normal RV systolic  function, normal pulmonary artery systolic pressure - Underwent coronary CTA in 06/2022 to make sure DOE was not an anginal equivalent- CTA showed mild nonobstructive disease. Study did note some scarring in the lungs  - PFTs in 06/2022 were consistent with COPD  - Patient now followed by pulmonology- on trelegy ellipta. Participating in pulmonary rehab. He really enjoys rehab, and notes that his breathing has been improving  - Suspect that patient's DOE is primarily due to COPD. I am glad he has been having such improvement after seeing pulmonology   Ankle Edema  - Patient has only mild ankle edema today. Ankle edema is overall well controlled and does not bother the patient very much  - Patient notes that his ankle edema is significantly better in the mornings. Encouraged him to elevated his feet whenever possible - I am hesitant to increase lasix given patient's CKD. Patient agrees with keeping his lasix dosing the same  - Continue lasix 20 mg every other day  - Creatinine was 1.52 and K 4.7 on 7/1 (baseline creatinine approx 1.4-1.7)   Mild, nonobstructive CAD  - Coronary CTA from 06/2022 showed a coronary calcium score of 270 (56th percentile), mild atherosclerosis of the LAD and Lcx. FFR showed no hemodynamically flow-limiting lesions  - Continue lipitor 40 mg daily  - Patient has not been taking ASA because he had bruising. Discussed that some bruising is normal, and he should be on ASA to reduce his risk of MI. Patient is willing to restart taking ASA every other day   HLD - Lipid panel from 12/22/22 showed LDL 55, HDL 48, triglycerides 72, total cholesterol 118  - Continue lipitor 40 mg daily   HTN  - BP well controlled on bisoprolol 5 mg daily  CKD stage III  - Baseline creatinine 1.4-1.7 - Avoid nephrotoxic medications   Dispo: 6 months with APP or Dr. Royann Shivers   Signed, Jonita Albee, PA-C

## 2023-01-13 ENCOUNTER — Encounter (HOSPITAL_COMMUNITY): Payer: Medicare Other

## 2023-01-13 VITALS — Wt 181.0 lb

## 2023-01-13 DIAGNOSIS — J984 Other disorders of lung: Secondary | ICD-10-CM | POA: Diagnosis not present

## 2023-01-13 NOTE — Progress Notes (Signed)
Daily Session Note  Patient Details  Name: [name] Park MRN: [id number] Date of Birth: 05/09/1951 Referring Provider:   Doristine Devoid Pulmonary Rehab Walk Test from 11/26/2022 in [facility]  Referring Provider [name]       Encounter Date: 01/13/2023  Check In:  Session Check In - 01/13/23 1519       Check-In   Supervising physician immediately available to respond to emergencies [facility] MD immediately available    Physician(s) Bernadene Person, NP    Location [facility]    Staff Present Samantha Belarus, RD, Dutch Gray, RN, Doris Cheadle, MS, ACSM-CEP, Exercise Physiologist;Casey Sonnie Alamo, MS, ACSM-CEP, Exercise Physiologist    Virtual Visit No    Medication changes reported     No    Fall or balance concerns reported    No    Tobacco Cessation No Change    Warm-up and Cool-down Performed as group-led instruction    Resistance Training Performed Yes    VAD Patient? No    PAD/SET Patient? No      Pain Assessment   Currently in Pain? No/denies    Multiple Pain Sites No             Capillary Blood Glucose: No results found for this or any previous visit (from the past 24 hour(s)).   Exercise Prescription Changes - 01/13/23 1500       Response to Exercise   Blood Pressure (Admit) 118/74    Blood Pressure (Exercise) 146/62    Blood Pressure (Exit) 104/58    Heart Rate (Admit) 57 bpm    Heart Rate (Exercise) 104 bpm    Heart Rate (Exit) 67 bpm    Oxygen Saturation (Admit) 98 %    Oxygen Saturation (Exercise) 99 %    Oxygen Saturation (Exit) 97 %    Rating of Perceived Exertion (Exercise) 13    Perceived Dyspnea (Exercise) 2    Duration Continue with 30 min of aerobic exercise without signs/symptoms of physical distress.    Intensity THRR unchanged      Progression   Progression Continue to progress workloads to maintain intensity without signs/symptoms of physical  distress.      Resistance Training   Training Prescription Yes    Weight blue bands    Reps 10-15    Time 10 Minutes      Treadmill   MPH 3    Grade 1.5    Minutes 15    METs 3.92      Rower   Level 3    Watts 36    Minutes 15             Social History   Tobacco Use  Smoking Status Former   Current packs/day: 0.00   Average packs/day: 1.5 packs/day for 35.0 years (52.5 ttl pk-yrs)   Types: Cigarettes   Start date: 02/04/1969   Quit date: 02/05/2004   Years since quitting: 18.9  Smokeless Tobacco Never    Goals Met:  Proper associated with RPD/PD & O2 Sat Independence with exercise equipment Exercise tolerated well No report of concerns or symptoms today Strength training completed today  Goals Unmet:  Not Applicable  Comments: Service time is from 1313 to 1445.    Dr. Mechele Collin is Medical Director for Pulmonary Rehab at [facility].

## 2023-01-15 ENCOUNTER — Encounter (HOSPITAL_COMMUNITY)
Admission: RE | Admit: 2023-01-15 | Discharge: 2023-01-15 | Disposition: A | Discharge status: Home / Self Care | Payer: Medicare Other | Source: Ambulatory Visit | Attending: Pulmonary Disease | Admitting: Pulmonary Disease

## 2023-01-15 DIAGNOSIS — J984 Other disorders of lung: Secondary | ICD-10-CM | POA: Diagnosis not present

## 2023-01-15 NOTE — Progress Notes (Signed)
Daily Session Note  Patient Details  Name: [name] Park MRN: [id number] Date of Birth: Nov 18, 1950 Referring Provider:   Doristine Devoid Pulmonary Rehab Walk Test from 11/26/2022 in [facility]  Referring Provider [name]       Encounter Date: 01/15/2023  Check In:  Session Check In - 01/15/23 1404       Check-In   Supervising physician immediately available to respond to emergencies [facility] MD immediately available    Physician(s) Eligha Bridegroom, NP    Location [facility]    Staff Present Samantha Belarus, RD, Dutch Gray, RN, Doris Cheadle, MS, ACSM-CEP, Exercise Physiologist;Casey Katrinka Blazing, RT    Virtual Visit No    Medication changes reported     No    Fall or balance concerns reported    No    Tobacco Cessation No Change    Warm-up and Cool-down Performed as group-led instruction    Resistance Training Performed Yes    VAD Patient? No    PAD/SET Patient? No      Pain Assessment   Currently in Pain? No/denies    Multiple Pain Sites No             Capillary Blood Glucose: No results found for this or any previous visit (from the past 24 hour(s)).    Social History   Tobacco Use  Smoking Status Former   Current packs/day: 0.00   Average packs/day: 1.5 packs/day for 35.0 years (52.5 ttl pk-yrs)   Types: Cigarettes   Start date: 02/04/1969   Quit date: 02/05/2004   Years since quitting: 18.9  Smokeless Tobacco Never    Goals Met:  Independence with exercise equipment Exercise tolerated well No report of concerns or symptoms today Strength training completed today  Goals Unmet:  Not Applicable  Comments: Service time is from 1310 to 1431    Dr. Mechele Collin is Medical Director for Pulmonary Rehab at [facility].

## 2023-01-20 ENCOUNTER — Encounter (HOSPITAL_COMMUNITY)
Admission: RE | Admit: 2023-01-20 | Discharge: 2023-01-20 | Disposition: A | Discharge status: Home / Self Care | Payer: Medicare Other | Source: Ambulatory Visit | Attending: Pulmonary Disease | Admitting: Pulmonary Disease

## 2023-01-20 DIAGNOSIS — J984 Other disorders of lung: Secondary | ICD-10-CM

## 2023-01-20 NOTE — Progress Notes (Signed)
Daily Session Note  Patient Details  Name: [name] Park MRN: [id number] Date of Birth: [birth year] Referring Provider:   Doristine Devoid Pulmonary Rehab Walk Test from 11/26/2022 in [facility]  Referring Provider [name]       Encounter Date: 01/20/2023  Check In:  Session Check In - 01/20/23 1400       Check-In   Supervising physician immediately available to respond to emergencies [facility] MD immediately available    Physician(s) Robin Searing, NP    [facility]    Staff Present Essie Hart, RN, BSN;Casey Smith, RT;David Manus Gunning, MS, ACSM-CEP, CCRP, Exercise Physiologist;Sheyna Pettibone Dionisio Paschal, ACSM-CEP, Exercise Physiologist    Virtual Visit No    Medication changes reported     No    Fall or balance concerns reported    No    Tobacco Cessation No Change    Warm-up and Cool-down Performed as group-led instruction    Resistance Training Performed Yes    VAD Patient? No    PAD/SET Patient? No      Pain Assessment   Currently in Pain? No/denies    Multiple Pain Sites No             Capillary Blood Glucose: No results found for this or any previous visit (from the past 24 hour(s)).    Social History   Tobacco Use  Smoking Status Former   Current packs/day: 0.00   Average packs/day: 1.5 packs/day for 35.0 years (52.5 ttl pk-yrs)   Types: Cigarettes   Start date: 02/04/1969   Quit date: 02/05/2004   Years since quitting: 18.9  Smokeless Tobacco Never    Goals Met:  Independence with exercise equipment Exercise tolerated well No report of concerns or symptoms today Strength training completed today  Goals Unmet:  Not Applicable  Comments: Service time is from 1315 to 1440.    Dr. Mechele Collin is Medical Director for Pulmonary Rehab at [facility].

## 2023-01-22 ENCOUNTER — Encounter (HOSPITAL_COMMUNITY)
Admission: RE | Admit: 2023-01-22 | Discharge: 2023-01-22 | Disposition: A | Discharge status: Home / Self Care | Payer: Medicare Other | Source: Ambulatory Visit | Attending: Pulmonary Disease | Admitting: Pulmonary Disease

## 2023-01-22 DIAGNOSIS — J984 Other disorders of lung: Secondary | ICD-10-CM | POA: Diagnosis not present

## 2023-01-22 NOTE — Progress Notes (Signed)
Daily Session Note  Patient Details  Name: [name] Park MRN: [id number] Date of Birth: 1950/08/29 Referring Provider:   Doristine Devoid Pulmonary Rehab Walk Test from 11/26/2022 in [facility], [facility]  Referring Provider [name]       Encounter Date: 01/22/2023  Check In:  Session Check In - 01/22/23 1409       Check-In   Supervising physician immediately available to respond to emergencies [facility] MD immediately available    Physician(s) Carlyon Shadow, NP    Location [facility]    Staff Present Samantha Belarus, RD, LDN;Randi Dionisio Paschal, ACSM-CEP, Exercise Physiologist;Mary Gerre Scull, RN, BSN;Hiroto Saltzman, RT;Jetta Walker BS, ACSM-CEP, Exercise Physiologist    Virtual Visit No    Medication changes reported     No    Fall or balance concerns reported    No    Tobacco Cessation No Change    Warm-up and Cool-down Performed as group-led instruction    Resistance Training Performed Yes    VAD Patient? No    PAD/SET Patient? No      Pain Assessment   Currently in Pain? No/denies    Multiple Pain Sites No             Capillary Blood Glucose: No results found for this or any previous visit (from the past 24 hour(s)).    Social History   Tobacco Use  Smoking Status Former   Current packs/day: 0.00   Average packs/day: 1.5 packs/day for 35.0 years (52.5 ttl pk-yrs)   Types: Cigarettes   Start date: 02/04/1969   Quit date: 02/05/2004   Years since quitting: 18.9  Smokeless Tobacco Never    Goals Met:  Proper associated with RPD/PD & O2 Sat Independence with exercise equipment Exercise tolerated well No report of concerns or symptoms today Strength training completed today  Goals Unmet:  Not Applicable  Comments: Service time is from 1318 to 1450.    Dr. Mechele Collin is Medical Director for Pulmonary Rehab at [facility].

## 2023-01-27 ENCOUNTER — Encounter (HOSPITAL_COMMUNITY)
Admission: RE | Admit: 2023-01-27 | Discharge: 2023-01-27 | Disposition: A | Discharge status: Home / Self Care | Payer: Medicare Other | Source: Ambulatory Visit | Attending: Pulmonary Disease | Admitting: Pulmonary Disease

## 2023-01-27 VITALS — Wt 179.5 lb

## 2023-01-27 DIAGNOSIS — J984 Other disorders of lung: Secondary | ICD-10-CM

## 2023-01-27 NOTE — Progress Notes (Signed)
Daily Session Note  Patient Details  Name: [name] Park MRN: [id number] Date of Birth: 11/27/1950 Referring Provider:   Doristine Devoid Pulmonary Rehab Walk Test from 11/26/2022 in [facility]  Referring Provider [name]       Encounter Date: 01/27/2023  Check In:  Session Check In - 01/27/23 1327       Check-In   Supervising physician immediately available to respond to emergencies [facility] MD immediately available    Physician(s) Carlyon Shadow, NP    Location [facility]    Staff Present Samantha Belarus, RD, LDN; Dionisio Paschal, ACSM-CEP, Exercise Physiologist;Mary Gerre Scull, RN, BSN;Casey Smith, RT;Johnny Hale Bogus, MS, Exercise Physiologist;Kaylee Earlene Plater, MS, ACSM-CEP, Exercise Physiologist    Virtual Visit No    Medication changes reported     No    Fall or balance concerns reported    No    Tobacco Cessation No Change    Warm-up and Cool-down Performed as group-led instruction    Resistance Training Performed Yes    VAD Patient? No    PAD/SET Patient? No      Pain Assessment   Currently in Pain? No/denies    Multiple Pain Sites No             Capillary Blood Glucose: No results found for this or any previous visit (from the past 24 hour(s)).   Exercise Prescription Changes - 01/27/23 1500       Response to Exercise   Blood Pressure (Admit) 114/58    Blood Pressure (Exercise) 140/68    Blood Pressure (Exit) 102/50    Heart Rate (Admit) 56 bpm    Heart Rate (Exercise) 114 bpm    Heart Rate (Exit) 74 bpm    Oxygen Saturation (Admit) 99 %    Oxygen Saturation (Exercise) 98 %    Oxygen Saturation (Exit) 97 %    Rating of Perceived Exertion (Exercise) 13    Perceived Dyspnea (Exercise) 3    Duration Continue with 30 min of aerobic exercise without signs/symptoms of physical distress.    Intensity THRR unchanged      Progression   Progression Continue to progress workloads to maintain  intensity without signs/symptoms of physical distress.    Average METs 4.33      Resistance Training   Training Prescription Yes    Weight blue bands    Reps 10-15    Time 10 Minutes      Treadmill   MPH 3.2    Grade 2    Minutes 15    METs 4.33      Rower   Level 4    Watts 39    Minutes 15      Home Exercise Plan   Plans to continue exercise at Home (comment)   walking TM   Frequency Add 1 additional day to program exercise sessions.    Initial Home Exercises Provided 01/27/23             Social History   Tobacco Use  Smoking Status Former   Current packs/day: 0.00   Average packs/day: 1.5 packs/day for 35.0 years (52.5 ttl pk-yrs)   Types: Cigarettes   Start date: 02/04/1969   Quit date: 02/05/2004   Years since quitting: 18.9  Smokeless Tobacco Never    Goals Met:  Proper associated with RPD/PD & O2 Sat Independence with exercise equipment Improved SOB with ADL's Exercise tolerated well No report of concerns or symptoms  today Strength training completed today  Goals Unmet:  Not Applicable  Comments: Service time is from 1310 to 1440.    Dr. Mechele Collin is Medical Director for Pulmonary Rehab at [facility]

## 2023-01-27 NOTE — Progress Notes (Signed)
Home Exercise Prescription I have reviewed a Home Exercise Prescription with [name] Park. He is currently not doing aerobic exercise. Discussed starting to walk 30 min 1-2 nonrehab days a week. He has a treadmill or could walk outside. The patient stated that their goals were to continue to lose weight and keep exercising. We reviewed exercise guidelines, target heart rate during exercise, RPE Scale, weather conditions, endpoints for exercise, warmup and cool down. The patient is encouraged to come to me with any questions. I will continue to follow up with the patient to assist them with progression and safety.  Spent 15 min discussing home exercise plan and goals.   [place], [place], ACSM-CEP 01/27/2023 3:06 PM

## 2023-01-28 NOTE — Progress Notes (Signed)
Pulmonary Individual Treatment Plan  Patient Details  Name: [name] Park MRN: [id number] Date of Birth: 10/06/1950 Referring Provider:   Doristine Devoid Pulmonary Rehab Walk Test from 11/26/2022 in [facility]  Referring Provider [name]       Initial Encounter Date:  Flowsheet Row Pulmonary Rehab Walk Test from 11/26/2022 in [facility]  Date 11/26/22       Visit Diagnosis: Restrictive lung disease  Patient's Home Medications on Admission:   Current Outpatient Medications:    aspirin EC 81 MG tablet, Take 81 mg by mouth daily. Swallow whole., Disp: , Rfl:    atorvastatin (LIPITOR) 40 MG tablet, TAKE 1 TABLET BY MOUTH  DAILY FOR CHOLESTEROL, Disp: 90 tablet, Rfl: 3   bisoprolol (ZEBETA) 5 MG tablet, TAKE 1 TABLET BY MOUTH DAILY FOR BLOOD PRESSURE, Disp: 90 tablet, Rfl: 3   buPROPion (WELLBUTRIN XL) 150 MG 24 hr tablet, TAKE 1 TABLET BY MOUTH DAILY FOR MOOD, FOCUS, AND CONCENTRATION, Disp: 90 tablet, Rfl: 3   Cholecalciferol (VITAMIN D-3) 1000 UNITS CAPS, Take 5,000 Units by mouth daily. , Disp: , Rfl:    Fluticasone-Umeclidin-Vilant (TRELEGY ELLIPTA) 100-62.5-25 MCG/ACT AEPB, Inhale 1 puff into the lungs daily., Disp: 60 each, Rfl: 4   furosemide (LASIX) 20 MG tablet, Take 20 mg every other day, Disp: 90 tablet, Rfl: 3   tamsulosin (FLOMAX) 0.4 MG CAPS capsule, Take 1 tablet at Bedtime for Prostate, Disp: 90 capsule, Rfl: 3  Past Medical History: Past Medical History:  Diagnosis Date   Acute appendicitis 11/24/2016   COPD (chronic obstructive pulmonary disease) (HCC)    GERD (gastroesophageal reflux disease)    Hematuria    HLD (hyperlipidemia)    Hypertension    IBS (irritable bowel syndrome)    Pre-diabetes    Vitamin D deficiency     Tobacco Use: Social History   Tobacco Use  Smoking Status Former   Current packs/day: 0.00   Average packs/day: 1.5  packs/day for 35.0 years (52.5 ttl pk-yrs)   Types: Cigarettes   Start date: 02/04/1969   Quit date: 02/05/2004   Years since quitting: 18.9  Smokeless Tobacco Never    Labs: Review Flowsheet  More data exists      Latest Ref Rng & Units 08/20/2021 01/30/2022 05/14/2022 09/03/2022 12/22/2022  Labs for ITP Cardiac and Pulmonary Rehab  Cholestrol <200 mg/dL 401  027  253  664  403   LDL (calc) mg/dL (calc) 72  78  72  48  55   HDL-C > OR = 40 mg/dL 63  57  61  65  48   Trlycerides <150 mg/dL 91  474  259  74  72   Hemoglobin A1c <5.7 % of total Hgb - 5.5  5.6  - 5.8     Details            Capillary Blood Glucose: No results found for: "GLUCAP"   Pulmonary Assessment Scores:  Pulmonary Assessment Scores     Row Name 11/26/22 1105         ADL UCSD   ADL Phase Entry     SOB Score total 47       CAT Score   CAT Score 20       mMRC Score   mMRC Score 1             UCSD: Self-administered rating of dyspnea associated  with activities of daily living (ADLs) 6-point scale (0 = "not at all" to 5 = "maximal or unable to do because of breathlessness")  Scoring Scores range from 0 to 120.  Minimally important difference is 5 units  CAT: CAT can identify the health impairment of COPD patients and is better correlated with disease progression.  CAT has a scoring range of zero to 40. The CAT score is classified into four groups of low (less than 10), medium (10 - 20), high (21-30) and very high (31-40) based on the impact level of disease on health status. A CAT score over 10 suggests significant symptoms.  A worsening CAT score could be explained by an exacerbation, poor medication adherence, poor inhaler technique, or progression of COPD or comorbid conditions.  CAT MCID is 2 points  mMRC: mMRC (Modified Medical Research Council) Dyspnea Scale is used to assess the degree of baseline functional disability in patients of respiratory disease due to dyspnea. No minimal  important difference is established. A decrease in score of 1 point or greater is considered a positive change.   Pulmonary Function Assessment:  Pulmonary Function Assessment - 11/26/22 1045       Breath   Bilateral Breath Sounds Clear    Shortness of Breath Yes;Limiting activity             Exercise Target Goals: Exercise Program Goal: Individual exercise prescription set using results from initial 6 min walk test and THRR while considering  patient's activity barriers and safety.   Exercise Prescription Goal: Initial exercise prescription builds to 30-45 minutes a day of aerobic activity, 2-3 days per week.  Home exercise guidelines will be given to patient during program as part of exercise prescription that the participant will acknowledge.  Activity Barriers & Risk Stratification:  Activity Barriers & Cardiac Risk Stratification - 11/26/22 1043       Activity Barriers & Cardiac Risk Stratification   Activity Barriers Deconditioning;Muscular Weakness;Shortness of Breath;Arthritis    Cardiac Risk Stratification Moderate             6 Minute Walk:  6 Minute Walk     Row Name 11/26/22 1146         6 Minute Walk   Phase Initial     Distance 1205 feet     Walk Time 6 minutes     # of Rest Breaks 0     MPH 2.28     METS 2.61     RPE 11     Perceived Dyspnea  1     VO2 Peak 9.12     Symptoms No     Resting HR 55 bpm     Resting BP 140/62     Resting Oxygen Saturation  96 %     Exercise Oxygen Saturation  during 6 min walk 94 %     Max Ex. HR 100 bpm     Max Ex. BP 132/62     2 Minute Post BP 118/60       Interval HR   1 Minute HR 94     2 Minute HR 100     3 Minute HR 100     4 Minute HR 97     5 Minute HR 95     6 Minute HR 99     2 Minute Post HR 61     Interval Heart Rate? Yes       Interval Oxygen   Interval Oxygen? Yes  Baseline Oxygen Saturation % 96 %     1 Minute Oxygen Saturation % 100 %     1 Minute Liters of Oxygen 0 L     2  Minute Oxygen Saturation % 97 %     2 Minute Liters of Oxygen 0 L     3 Minute Oxygen Saturation % 91 %     3 Minute Liters of Oxygen 0 L     4 Minute Oxygen Saturation % 97 %     4 Minute Liters of Oxygen 0 L     5 Minute Oxygen Saturation % 98 %     5 Minute Liters of Oxygen 0 L     6 Minute Oxygen Saturation % 94 %     6 Minute Liters of Oxygen 0 L     2 Minute Post Oxygen Saturation % 100 %     2 Minute Post Liters of Oxygen 0 L              Oxygen Initial Assessment:  Oxygen Initial Assessment - 11/26/22 1045       Home Oxygen   Home Oxygen Device None    Sleep Oxygen Prescription None    Home Exercise Oxygen Prescription None    Home Resting Oxygen Prescription None      Initial 6 min Walk   Oxygen Used None      Program Oxygen Prescription   Program Oxygen Prescription None      Intervention   Short Term Goals To learn and understand importance of maintaining oxygen saturations>88%;To learn and demonstrate proper use of respiratory medications;To learn and understand importance of monitoring SPO2 with pulse oximeter and demonstrate accurate use of the pulse oximeter.;To learn and demonstrate proper pursed lip breathing techniques or other breathing techniques. ;To learn and exhibit compliance with exercise, home and travel O2 prescription    Long  Term Goals Exhibits compliance with exercise, home  and travel O2 prescription;Maintenance of O2 saturations>88%;Compliance with respiratory medication;Verbalizes importance of monitoring SPO2 with pulse oximeter and return demonstration;Exhibits proper breathing techniques, such as pursed lip breathing or other method taught during program session;Demonstrates proper use of MDI's             Oxygen Re-Evaluation:  Oxygen Re-Evaluation     Row Name 11/26/22 1526 12/29/22 1013 01/23/23 1523         Program Oxygen Prescription   Program Oxygen Prescription None None None       Home Oxygen   Home Oxygen Device  None None None     Sleep Oxygen Prescription None None None     Home Exercise Oxygen Prescription None None None     Home Resting Oxygen Prescription None None None       Goals/Expected Outcomes   Short Term Goals To learn and understand importance of maintaining oxygen saturations>88%;To learn and demonstrate proper use of respiratory medications;To learn and understand importance of monitoring SPO2 with pulse oximeter and demonstrate accurate use of the pulse oximeter.;To learn and demonstrate proper pursed lip breathing techniques or other breathing techniques. ;To learn and exhibit compliance with exercise, home and travel O2 prescription To learn and understand importance of maintaining oxygen saturations>88%;To learn and demonstrate proper use of respiratory medications;To learn and understand importance of monitoring SPO2 with pulse oximeter and demonstrate accurate use of the pulse oximeter.;To learn and demonstrate proper pursed lip breathing techniques or other breathing techniques. ;To learn and exhibit compliance with exercise, home and travel O2  prescription To learn and understand importance of maintaining oxygen saturations>88%;To learn and demonstrate proper use of respiratory medications;To learn and understand importance of monitoring SPO2 with pulse oximeter and demonstrate accurate use of the pulse oximeter.;To learn and demonstrate proper pursed lip breathing techniques or other breathing techniques. ;To learn and exhibit compliance with exercise, home and travel O2 prescription     Long  Term Goals Exhibits compliance with exercise, home  and travel O2 prescription;Maintenance of O2 saturations>88%;Compliance with respiratory medication;Verbalizes importance of monitoring SPO2 with pulse oximeter and return demonstration;Exhibits proper breathing techniques, such as pursed lip breathing or other method taught during program session;Demonstrates proper use of MDI's Exhibits compliance  with exercise, home  and travel O2 prescription;Maintenance of O2 saturations>88%;Compliance with respiratory medication;Verbalizes importance of monitoring SPO2 with pulse oximeter and return demonstration;Exhibits proper breathing techniques, such as pursed lip breathing or other method taught during program session;Demonstrates proper use of MDI's Exhibits compliance with exercise, home  and travel O2 prescription;Maintenance of O2 saturations>88%;Compliance with respiratory medication;Verbalizes importance of monitoring SPO2 with pulse oximeter and return demonstration;Exhibits proper breathing techniques, such as pursed lip breathing or other method taught during program session;Demonstrates proper use of MDI's     Goals/Expected Outcomes Compliance and understanding of oxygen saturations monitoring and breathing techniques to decrease shortness of breath. Compliance and understanding of oxygen saturations monitoring and breathing techniques to decrease shortness of breath. Compliance and understanding of oxygen saturations monitoring and breathing techniques to decrease shortness of breath.              Oxygen Discharge (Final Oxygen Re-Evaluation):  Oxygen Re-Evaluation - 01/23/23 1523       Program Oxygen Prescription   Program Oxygen Prescription None      Home Oxygen   Home Oxygen Device None    Sleep Oxygen Prescription None    Home Exercise Oxygen Prescription None    Home Resting Oxygen Prescription None      Goals/Expected Outcomes   Short Term Goals To learn and understand importance of maintaining oxygen saturations>88%;To learn and demonstrate proper use of respiratory medications;To learn and understand importance of monitoring SPO2 with pulse oximeter and demonstrate accurate use of the pulse oximeter.;To learn and demonstrate proper pursed lip breathing techniques or other breathing techniques. ;To learn and exhibit compliance with exercise, home and travel O2 prescription     Long  Term Goals Exhibits compliance with exercise, home  and travel O2 prescription;Maintenance of O2 saturations>88%;Compliance with respiratory medication;Verbalizes importance of monitoring SPO2 with pulse oximeter and return demonstration;Exhibits proper breathing techniques, such as pursed lip breathing or other method taught during program session;Demonstrates proper use of MDI's    Goals/Expected Outcomes Compliance and understanding of oxygen saturations monitoring and breathing techniques to decrease shortness of breath.             Initial Exercise Prescription:  Initial Exercise Prescription - 11/26/22 1100       Date of Initial Exercise RX and Referring Provider   Date 11/26/22    Referring Provider [name]    Expected Discharge Date 02/19/23      Treadmill   MPH 2.5    Grade 0    Minutes 15      Rower   Level 1    Watts 16    Minutes 15      Prescription Details   Frequency (times per week) 2    Duration Progress to 30 minutes of continuous aerobic without signs/symptoms of physical distress  Intensity   THRR 40-80% of Max Heartrate 59-118    Ratings of Perceived Exertion 11-13    Perceived Dyspnea 0-4      Progression   Progression Continue progressive overload as per policy without signs/symptoms or physical distress.      Resistance Training   Training Prescription Yes    Weight blue bands    Reps 10-15             Perform Capillary Blood Glucose checks as needed.  Exercise Prescription Changes:   Exercise Prescription Changes     Row Name 12/02/22 1500 12/16/22 1500 12/30/22 1500 01/13/23 1500 01/27/23 1500     Response to Exercise   Blood Pressure (Admit) 120/50 122/52 114/58 118/74 114/58   Blood Pressure (Exercise) 126/60 136/64 128/61 146/62 140/68   Blood Pressure (Exit) 108/60 98/58 104/56 104/58 102/50   Heart Rate (Admit) 60 bpm 54 bpm 58 bpm 57 bpm 56 bpm   Heart Rate (Exercise) 112 bpm 103 bpm 98 bpm 104 bpm 114 bpm    Heart Rate (Exit) 78 bpm 65 bpm 72 bpm 67 bpm 74 bpm   Oxygen Saturation (Admit) 97 % 98 % 96 % 98 % 99 %   Oxygen Saturation (Exercise) 97 % 95 % 98 % 99 % 98 %   Oxygen Saturation (Exit) 96 % 96 % 97 % 97 % 97 %   Rating of Perceived Exertion (Exercise) 15 14 11 13 13    Perceived Dyspnea (Exercise) 3 3 1 2 3    Duration Progress to 30 minutes of  aerobic without signs/symptoms of physical distress Continue with 30 min of aerobic exercise without signs/symptoms of physical distress. Continue with 30 min of aerobic exercise without signs/symptoms of physical distress. Continue with 30 min of aerobic exercise without signs/symptoms of physical distress. Continue with 30 min of aerobic exercise without signs/symptoms of physical distress.   Intensity THRR unchanged THRR unchanged THRR unchanged THRR unchanged THRR unchanged     Progression   Progression Continue to progress workloads to maintain intensity without signs/symptoms of physical distress. Continue to progress workloads to maintain intensity without signs/symptoms of physical distress. Continue to progress workloads to maintain intensity without signs/symptoms of physical distress. Continue to progress workloads to maintain intensity without signs/symptoms of physical distress. Continue to progress workloads to maintain intensity without signs/symptoms of physical distress.   Average METs -- -- -- -- 4.33     Resistance Training   Training Prescription Yes Yes Yes Yes Yes   Weight blue bands blue bands blue bands blue bands blue bands   Reps 10-15 10-15 10-15 10-15 10-15   Time 10 Minutes 10 Minutes 10 Minutes 10 Minutes 10 Minutes     Treadmill   MPH 2.5 2.5 2.8 3 3.2   Grade 0 1 1 1.5 2   Minutes 15 15 15 15 15    METs 2.91 3.26 3.53 3.92 4.33     Rower   Level 1 2 2 3 4    Watts 13 33 33 36 39   Minutes 15 15 15 15 15      Home Exercise Plan   Plans to continue exercise at -- -- -- -- Home (comment)  walking TM   Frequency --  -- -- -- Add 1 additional day to program exercise sessions.   Initial Home Exercises Provided -- -- -- -- 01/27/23            Exercise Comments:   Exercise Comments     Row  Name 12/02/22 1456 01/27/23 1503         Exercise Comments Pt completed first day of exercise. Will discuss METs next time. Pt exercised for 15 min on the treadmill and rower. Khary averaged 2.91 METs on the treadmill and 13 watts at level 1 on the rower. He performed the warmup and cooldown standing without limitations. Discussed with pt home exercise plan. He is currently not doing aerobic exercise. Discussed starting to walk 30 min 1-2 nonrehab days a week. He has a treadmill or could walk outside.               Exercise Goals and Review:   Exercise Goals     Row Name 11/26/22 1044 12/30/22 1252 01/23/23 1521         Exercise Goals   Increase Physical Activity Yes Yes Yes     Intervention Provide advice, education, support and counseling about physical activity/exercise needs.;Develop an individualized exercise prescription for aerobic and resistive training based on initial evaluation findings, risk stratification, comorbidities and participant's personal goals. Provide advice, education, support and counseling about physical activity/exercise needs.;Develop an individualized exercise prescription for aerobic and resistive training based on initial evaluation findings, risk stratification, comorbidities and participant's personal goals. Provide advice, education, support and counseling about physical activity/exercise needs.;Develop an individualized exercise prescription for aerobic and resistive training based on initial evaluation findings, risk stratification, comorbidities and participant's personal goals.     Expected Outcomes Short Term: Attend rehab on a regular basis to increase amount of physical activity.;Long Term: Exercising regularly at least 3-5 days a week.;Long Term: Add in home exercise to  make exercise part of routine and to increase amount of physical activity. Short Term: Attend rehab on a regular basis to increase amount of physical activity.;Long Term: Exercising regularly at least 3-5 days a week.;Long Term: Add in home exercise to make exercise part of routine and to increase amount of physical activity. Short Term: Attend rehab on a regular basis to increase amount of physical activity.;Long Term: Exercising regularly at least 3-5 days a week.;Long Term: Add in home exercise to make exercise part of routine and to increase amount of physical activity.     Increase Strength and Stamina Yes Yes Yes     Intervention Develop an individualized exercise prescription for aerobic and resistive training based on initial evaluation findings, risk stratification, comorbidities and participant's personal goals.;Provide advice, education, support and counseling about physical activity/exercise needs. Develop an individualized exercise prescription for aerobic and resistive training based on initial evaluation findings, risk stratification, comorbidities and participant's personal goals.;Provide advice, education, support and counseling about physical activity/exercise needs. Develop an individualized exercise prescription for aerobic and resistive training based on initial evaluation findings, risk stratification, comorbidities and participant's personal goals.;Provide advice, education, support and counseling about physical activity/exercise needs.     Expected Outcomes Short Term: Increase workloads from initial exercise prescription for resistance, speed, and METs.;Short Term: Perform resistance training exercises routinely during rehab and add in resistance training at home;Long Term: Improve cardiorespiratory fitness, muscular endurance and strength as measured by increased METs and functional capacity ( ) Short Term: Increase workloads from initial exercise prescription for resistance, speed,  and METs.;Short Term: Perform resistance training exercises routinely during rehab and add in resistance training at home;Long Term: Improve cardiorespiratory fitness, muscular endurance and strength as measured by increased METs and functional capacity ( ) Short Term: Increase workloads from initial exercise prescription for resistance, speed, and METs.;Short Term: Perform resistance training exercises routinely during rehab  and add in resistance training at home;Long Term: Improve cardiorespiratory fitness, muscular endurance and strength as measured by increased METs and functional capacity ( )     Able to understand and use rate of perceived exertion (RPE) scale Yes Yes Yes     Intervention Provide education and explanation on how to use RPE scale Provide education and explanation on how to use RPE scale Provide education and explanation on how to use RPE scale     Expected Outcomes Short Term: Able to use RPE daily in rehab to express subjective intensity level;Long Term:  Able to use RPE to guide intensity level when exercising independently Short Term: Able to use RPE daily in rehab to express subjective intensity level;Long Term:  Able to use RPE to guide intensity level when exercising independently Short Term: Able to use RPE daily in rehab to express subjective intensity level;Long Term:  Able to use RPE to guide intensity level when exercising independently     Able to understand and use Dyspnea scale Yes Yes Yes     Intervention Provide education and explanation on how to use Dyspnea scale Provide education and explanation on how to use Dyspnea scale Provide education and explanation on how to use Dyspnea scale     Expected Outcomes Short Term: Able to use Dyspnea scale daily in rehab to express subjective sense of shortness of breath during exertion;Long Term: Able to use Dyspnea scale to guide intensity level when exercising independently Short Term: Able to use Dyspnea scale daily in  rehab to express subjective sense of shortness of breath during exertion;Long Term: Able to use Dyspnea scale to guide intensity level when exercising independently Short Term: Able to use Dyspnea scale daily in rehab to express subjective sense of shortness of breath during exertion;Long Term: Able to use Dyspnea scale to guide intensity level when exercising independently     Knowledge and understanding of Target Heart Rate Range (THRR) Yes Yes Yes     Intervention Provide education and explanation of THRR including how the numbers were predicted and where they are located for reference Provide education and explanation of THRR including how the numbers were predicted and where they are located for reference Provide education and explanation of THRR including how the numbers were predicted and where they are located for reference     Expected Outcomes Short Term: Able to state/look up THRR;Long Term: Able to use THRR to govern intensity when exercising independently;Short Term: Able to use daily as guideline for intensity in rehab Short Term: Able to state/look up THRR;Long Term: Able to use THRR to govern intensity when exercising independently;Short Term: Able to use daily as guideline for intensity in rehab Short Term: Able to state/look up THRR;Long Term: Able to use THRR to govern intensity when exercising independently;Short Term: Able to use daily as guideline for intensity in rehab     Understanding of Exercise Prescription Yes Yes Yes     Intervention Provide education, explanation, and written materials on patient's individual exercise prescription Provide education, explanation, and written materials on patient's individual exercise prescription Provide education, explanation, and written materials on patient's individual exercise prescription     Expected Outcomes Short Term: Able to explain program exercise prescription;Long Term: Able to explain home exercise prescription to exercise  independently Short Term: Able to explain program exercise prescription;Long Term: Able to explain home exercise prescription to exercise independently Short Term: Able to explain program exercise prescription;Long Term: Able to explain home exercise prescription to exercise independently  Exercise Goals Re-Evaluation :  Exercise Goals Re-Evaluation     Row Name 11/26/22 1525 12/29/22 1009 01/23/23 1522         Exercise Goal Re-Evaluation   Exercise Goals Review Increase Physical Activity;Able to understand and use Dyspnea scale;Understanding of Exercise Prescription;Increase Strength and Stamina;Knowledge and understanding of Target Heart Rate Range (THRR);Able to understand and use rate of perceived exertion (RPE) scale Increase Physical Activity;Able to understand and use Dyspnea scale;Understanding of Exercise Prescription;Increase Strength and Stamina;Knowledge and understanding of Target Heart Rate Range (THRR);Able to understand and use rate of perceived exertion (RPE) scale Increase Physical Activity;Able to understand and use Dyspnea scale;Understanding of Exercise Prescription;Increase Strength and Stamina;Knowledge and understanding of Target Heart Rate Range (THRR);Able to understand and use rate of perceived exertion (RPE) scale     Comments Pt is scheduled to begin exercise on 6/11. Will monitor for progression. Anton has completed 7 exercise sessions. He exercises for 15 min on the treadmill and rower. Daishaun averages 3.53 METs at 2.8 mph and 1.0% incline on the treadmill and 33 watts on the rower. Render performs the warmup and cooldown standing without limitations. He has increased his workload for both exercise modes as he tolerates progressions well. Kimo seems motivated to exercise and increase his functional capacity. Will continue to monitor and progress as able Merek has completed 15 exercise sessions. He exercises for 15 min on the treadmill and rower. Hector  averages 3.92 METs at 3 mph and 1.5% incline on the treadmill and 38 watts on the rower. Bakary performs the warmup and cooldown standing without limitations. He has increased his workload for both exercise modes as he tolerates progressions well. Venkat seems motivated to exercise and increase his functional capacity. Will continue to monitor and progress as able     Expected Outcomes Through exercise at rehab and at home, the patient will decrease shortness of breath with daily activities and feel confident in carrying out an exercise regime at home. Through exercise at rehab and at home, the patient will decrease shortness of breath with daily activities and feel confident in carrying out an exercise regime at home. Through exercise at rehab and at home, the patient will decrease shortness of breath with daily activities and feel confident in carrying out an exercise regime at home.              Discharge Exercise Prescription (Final Exercise Prescription Changes):  Exercise Prescription Changes - 01/27/23 1500       Response to Exercise   Blood Pressure (Admit) 114/58    Blood Pressure (Exercise) 140/68    Blood Pressure (Exit) 102/50    Heart Rate (Admit) 56 bpm    Heart Rate (Exercise) 114 bpm    Heart Rate (Exit) 74 bpm    Oxygen Saturation (Admit) 99 %    Oxygen Saturation (Exercise) 98 %    Oxygen Saturation (Exit) 97 %    Rating of Perceived Exertion (Exercise) 13    Perceived Dyspnea (Exercise) 3    Duration Continue with 30 min of aerobic exercise without signs/symptoms of physical distress.    Intensity THRR unchanged      Progression   Progression Continue to progress workloads to maintain intensity without signs/symptoms of physical distress.    Average METs 4.33      Resistance Training   Training Prescription Yes    Weight blue bands    Reps 10-15    Time 10 Minutes      Treadmill  MPH 3.2    Grade 2    Minutes 15    METs 4.33      Rower   Level 4     Watts 39    Minutes 15      Home Exercise Plan   Plans to continue exercise at Home (comment)   walking TM   Frequency Add 1 additional day to program exercise sessions.    Initial Home Exercises Provided 01/27/23             Nutrition:  Target Goals: Understanding of nutrition guidelines, daily intake of sodium 1500mg , cholesterol 200mg , calories 30% from fat and 7% or less from saturated fats, daily to have 5 or more servings of fruits and vegetables.  Biometrics:  Pre Biometrics - 11/26/22 1224       Pre Biometrics   Grip Strength 35 kg              Nutrition Therapy Plan and Nutrition Goals:  Nutrition Therapy & Goals - 01/27/23 1457       Nutrition Therapy   Diet Heart Healthy Diet    Drug/Food Interactions Statins/Certain Fruits      Personal Nutrition Goals   Nutrition Goal Patient to improve diet quality by using the plate method as a guide for meal planning to include lean protein/plant protein, fruits, vegetables, whole grains, nonfat dairy as part of a well-balanced diet.    Personal Goal #2 Patient to identify strategies for weight loss of 0.5-2.0# per week.    Comments Goals in progress. Haran reports that he had dental surgery ~10 weeks ago (at 195#) and is awaiting lower implants; he is primarily eating soft foods/pureed foods. He will likely receive final implant in October. During the first 2 weeks post-op ,he reports weight loss of ~10#. He has now increased protein intake from soft food sources such as beans, eggs, protein powder smoothies, protein shakes, etc to aid with maintenance of lean muscle mass.  He is mindful of sodium intake. He is down 7.5# since starting with our program; indicitive of more appropriate calorie deficit at this time. His wife is a good support. Bassem will benefit from participation in pulmonary rehab for nutrition, exercise, and lifestyle changes.      Intervention Plan   Intervention Prescribe, educate and counsel  regarding individualized specific dietary modifications aiming towards targeted core components such as weight, hypertension, lipid management, diabetes, heart failure and other comorbidities.;Nutrition handout(s) given to patient.    Expected Outcomes Short Term Goal: Understand basic principles of dietary content, such as calories, fat, sodium, cholesterol and nutrients.;Long Term Goal: Adherence to prescribed nutrition plan.             Nutrition Assessments:  Nutrition Assessments - 12/02/22 1451       Rate Your Plate Scores   Pre Score 66            MEDIFICTS Score Key: ?70 Need to make dietary changes  40-70 Heart Healthy Diet ? 40 Therapeutic Level Cholesterol Diet  Flowsheet Row PULMONARY REHAB OTHER RESPIRATORY from 12/02/2022 in [facility] Heart, Vascular, & Lung Health  Picture Your Plate Total Score on Admission 66      Picture Your Plate Scores: <16 Unhealthy dietary pattern with much room for improvement. 41-50 Dietary pattern unlikely to meet recommendations for good health and room for improvement. 51-60 More healthful dietary pattern, with some room for improvement.  >60 Healthy dietary pattern, although there may be  some specific behaviors that could be improved.    Nutrition Goals Re-Evaluation:  Nutrition Goals Re-Evaluation     Row Name 12/02/22 1438 12/30/22 1446 01/27/23 1457         Goals   Current Weight 185 lb 3 oz (84 kg) 182 lb 8.7 oz (82.8 kg) 179 lb 7.3 oz (81.4 kg)     Comment Lipids WNL, A1c WNL, Cr 1.49, GFR 49 A1c 5.8, Cr 1.52, GFR 48 no new labs; most recent labs A1c 5.8, Cr 1.52, GFR 48     Expected Outcome Sewell reports that he had dental surgery ~2 weeks ago (at 195#) and is awaiting lower implants; he is primarily eating soft foods/pureed foods. He reports weight loss of ~10# over the last two weeks; we discussed increasing calories from protein and implementing protein supplements. He is interested in  some weight loss. Historically, he reports preferring meat and carbohydrate foods. He is mindful of sodium intake. His wife is a good support. Turan will benefit from participation in pulmonary rehab for nutrition, exercise, and lifestyle changes. Goals in progress. Caden reports that he had dental surgery ~6 weeks ago (at 195#) and is awaiting lower implants; he is primarily eating soft foods/pureed foods. During the first 2 weeks post-op ,he reports weight loss of ~10#. He has now increased protein intake from soft food sources such as beans, eggs, protein powder smoothies, protein shakes, etc to aid with maintenance of lean muscle mass. He is mindful of sodium intake. He is down 4.4# since starting with our program; indicitive of more appropriate calorie deficit at this time. His wife is a good support. Jemaine will benefit from participation in pulmonary rehab for nutrition, exercise, and lifestyle changes. Goals in progress. Teng reports that he had dental surgery ~10 weeks ago (at 195#) and is awaiting lower implants; he is primarily eating soft foods/pureed foods. He will likely receive final implant in October. During the first 2 weeks post-op ,he reports weight loss of ~10#. He has now increased protein intake from soft food sources such as beans, eggs, protein powder smoothies, protein shakes, etc to aid with maintenance of lean muscle mass. He is mindful of sodium intake. He is down 7.5# since starting with our program; indicitive of more appropriate calorie deficit at this time. His wife is a good support. Dung will benefit from participation in pulmonary rehab for nutrition, exercise, and lifestyle changes.              Nutrition Goals Discharge (Final Nutrition Goals Re-Evaluation):  Nutrition Goals Re-Evaluation - 01/27/23 1457       Goals   Current Weight 179 lb 7.3 oz (81.4 kg)    Comment no new labs; most recent labs A1c 5.8, Cr 1.52, GFR 48    Expected Outcome Goals in progress.  Domingos reports that he had dental surgery ~10 weeks ago (at 195#) and is awaiting lower implants; he is primarily eating soft foods/pureed foods. He will likely receive final implant in October. During the first 2 weeks post-op ,he reports weight loss of ~10#. He has now increased protein intake from soft food sources such as beans, eggs, protein powder smoothies, protein shakes, etc to aid with maintenance of lean muscle mass. He is mindful of sodium intake. He is down 7.5# since starting with our program; indicitive of more appropriate calorie deficit at this time. His wife is a good support. Manan will benefit from participation in pulmonary rehab for nutrition, exercise, and lifestyle changes.  Psychosocial: Target Goals: Acknowledge presence or absence of significant depression and/or stress, maximize coping skills, provide positive support system. Participant is able to verbalize types and ability to use techniques and skills needed for reducing stress and depression.  Initial Review & Psychosocial Screening:  Initial Psych Review & Screening - 11/26/22 1040       Initial Review   Current issues with None Identified      Family Dynamics   Good Support System? Yes      Barriers   Psychosocial barriers to participate in program There are no identifiable barriers or psychosocial needs.      Screening Interventions   Interventions Encouraged to exercise             Quality of Life Scores:  Scores of 19 and below usually indicate a poorer quality of life in these areas.  A difference of  2-3 points is a clinically meaningful difference.  A difference of 2-3 points in the total score of the Quality of Life Index has been associated with significant improvement in overall quality of life, self-image, physical symptoms, and general health in studies assessing change in quality of life.  PHQ-9: Review Flowsheet  More data exists      11/26/2022 09/03/2022 01/29/2022  08/20/2021 11/27/2020  Depression screen PHQ 2/9  Decreased Interest 1 0 0 0 0  Down, Depressed, Hopeless 1 0 0 0 0  PHQ - 2 Score 2 0 0 0 0  Altered sleeping 0 - - - -  Tired, decreased energy 0 - - - -  Change in appetite 0 - - - -  Feeling bad or failure about yourself  0 - - - -  Trouble concentrating 1 - - - -  Moving slowly or fidgety/restless 1 - - - -  PHQ-9 Score 4 - - - -  Difficult doing work/chores Somewhat difficult - - - -    Details           Interpretation of Total Score  Total Score Depression Severity:  1-4 = Minimal depression, 5-9 = Mild depression, 10-14 = Moderate depression, 15-19 = Moderately severe depression, 20-27 = Severe depression   Psychosocial Evaluation and Intervention:  Psychosocial Evaluation - 01/23/23 0907       Psychosocial Evaluation & Interventions   Interventions --    Comments --    Expected Outcomes --    Continue Psychosocial Services  --             Psychosocial Re-Evaluation:  Psychosocial Re-Evaluation     Row Name 11/28/22 0816 12/26/22 1606 01/23/23 0913         Psychosocial Re-Evaluation   Current issues with None Identified None Identified None Identified     Comments No new psychosocial barriers or concerns since orientation on 6/5. Pt scheduled to start on 6/11 Eliab denies any psychosocial barriers or concerns. Lyndel states that he is having upcoming dental work with implants. He has been working with our dietician to gain weight as he has lost due to having his teeth pulled and pain and soreness orally. He is looking forward to having teeth again and eating a substantial diet.     Expected Outcomes For Aeryn to participate in PR free of any psychosocial barriers or concerns For Jahred to continue to attend PR free of any psychosocial barriers or concerns For Akeel to gain weight and continue to participate in PR free of any concerns.     Interventions Encouraged to attend  Pulmonary Rehabilitation for the exercise  Encouraged to attend Pulmonary Rehabilitation for the exercise Encouraged to attend Pulmonary Rehabilitation for the exercise     Continue Psychosocial Services  No Follow up required No Follow up required No Follow up required              Psychosocial Discharge (Final Psychosocial Re-Evaluation):  Psychosocial Re-Evaluation - 01/23/23 0913       Psychosocial Re-Evaluation   Current issues with None Identified    Comments Edker states that he is having upcoming dental work with implants. He has been working with our dietician to gain weight as he has lost due to having his teeth pulled and pain and soreness orally. He is looking forward to having teeth again and eating a substantial diet.    Expected Outcomes For Jusiah to gain weight and continue to participate in PR free of any concerns.    Interventions Encouraged to attend Pulmonary Rehabilitation for the exercise    Continue Psychosocial Services  No Follow up required             Education: Education Goals: Education classes will be provided on a weekly basis, covering required topics. Participant will state understanding/return demonstration of topics presented.  Learning Barriers/Preferences:  Learning Barriers/Preferences - 11/26/22 1042       Learning Barriers/Preferences   Learning Barriers None    Learning Preferences Group Instruction;Written Material             Education Topics: Introduction to Pulmonary Rehab Group instruction provided by PowerPoint, verbal discussion, and written material to support subject matter. Instructor reviews what Pulmonary Rehab is, the purpose of the program, and how patients are referred.     Know Your Numbers Group instruction that is supported by a PowerPoint presentation. Instructor discusses importance of knowing and understanding resting, exercise, and post-exercise oxygen saturation, heart rate, and blood pressure. Oxygen saturation, heart rate, blood pressure, rating  of perceived exertion, and dyspnea are reviewed along with a normal range for these values.  Flowsheet Row PULMONARY REHAB OTHER RESPIRATORY from 01/01/2023 in [facility] for Heart, Vascular, & Lung Health  Date 01/01/23  Educator EP  Instruction Review Code 1- Verbalizes Understanding       Exercise for the Pulmonary Patient Group instruction that is supported by a PowerPoint presentation. Instructor discusses benefits of exercise, core components of exercise, frequency, duration, and intensity of an exercise routine, importance of utilizing pulse oximetry during exercise, safety while exercising, and options of places to exercise outside of rehab.       MET Level  Group instruction provided by PowerPoint, verbal discussion, and written material to support subject matter. Instructor reviews what METs are and how to increase METs.    Pulmonary Medications Verbally interactive group education provided by instructor with focus on inhaled medications and proper administration. Flowsheet Row PULMONARY REHAB OTHER RESPIRATORY from 12/11/2022 in [facility] for Heart, Vascular, & Lung Health  Date 12/11/22  Educator RT  Instruction Review Code 1- Verbalizes Understanding       Anatomy and Physiology of the Respiratory System Group instruction provided by PowerPoint, verbal discussion, and written material to support subject matter. Instructor reviews respiratory cycle and anatomical components of the respiratory system and their functions. Instructor also reviews differences in obstructive and restrictive respiratory diseases with examples of each.  Flowsheet Row PULMONARY REHAB OTHER RESPIRATORY from 12/04/2022 in [facility] for Heart, Vascular, & Lung Health  Date 12/04/22  Educator RT  Instruction Review Code 1- Verbalizes Understanding       Oxygen Safety Group instruction provided by PowerPoint, verbal  discussion, and written material to support subject matter. There is an overview of "What is Oxygen" and "Why do we need it".  Instructor also reviews how to create a safe environment for oxygen use, the importance of using oxygen as prescribed, and the risks of noncompliance. There is a brief discussion on traveling with oxygen and resources the patient may utilize. Flowsheet Row PULMONARY REHAB OTHER RESPIRATORY from 01/08/2023 in [facility] for Heart, Vascular, & Lung Health  Date 01/08/23  Educator RN  Instruction Review Code 1- Verbalizes Understanding       Oxygen Use Group instruction provided by PowerPoint, verbal discussion, and written material to discuss how supplemental oxygen is prescribed and different types of oxygen supply systems. Resources for more information are provided.  Flowsheet Row PULMONARY REHAB OTHER RESPIRATORY from 01/15/2023 in [facility] for Heart, Vascular, & Lung Health  Date 01/15/23  Educator RT  Instruction Review Code 1- Verbalizes Understanding       Breathing Techniques Group instruction that is supported by demonstration and informational handouts. Instructor discusses the benefits of pursed lip and diaphragmatic breathing and detailed demonstration on how to perform both.  Flowsheet Row PULMONARY REHAB OTHER RESPIRATORY from 01/22/2023 in [facility] for Heart, Vascular, & Lung Health  Date 01/22/23  Educator RN  Instruction Review Code 1- Verbalizes Understanding        Risk Factor Reduction Group instruction that is supported by a PowerPoint presentation. Instructor discusses the definition of a risk factor, different risk factors for pulmonary disease, and how the heart and lungs work together.   MD Day A group question and answer session with a medical doctor that allows participants to ask questions that relate to their pulmonary disease state.   Nutrition for the  Pulmonary Patient Group instruction provided by PowerPoint slides, verbal discussion, and written materials to support subject matter. The instructor gives an explanation and review of healthy diet recommendations, which includes a discussion on weight management, recommendations for fruit and vegetable consumption, as well as protein, fluid, caffeine, fiber, sodium, sugar, and alcohol. Tips for eating when patients are short of breath are discussed.    Other Education Group or individual verbal, written, or video instructions that support the educational goals of the pulmonary rehab program.    Knowledge Questionnaire Score:  Knowledge Questionnaire Score - 11/26/22 1105       Knowledge Questionnaire Score   Pre Score 14/18             Core Components/Risk Factors/Patient Goals at Admission:  Personal Goals and Risk Factors at Admission - 11/26/22 1042       Core Components/Risk Factors/Patient Goals on Admission    Weight Management Weight Loss    Improve shortness of breath with ADL's Yes    Intervention Provide education, individualized exercise plan and daily activity instruction to help decrease symptoms of SOB with activities of daily living.    Expected Outcomes Short Term: Improve cardiorespiratory fitness to achieve a reduction of symptoms when performing ADLs             Core Components/Risk Factors/Patient Goals Review:   Goals and Risk Factor Review     Row Name 11/28/22 0818 12/26/22 1608 01/23/23 0914         Core Components/Risk Factors/Patient Goals  Review   Personal Goals Review Weight Management/Obesity;Develop more efficient breathing techniques such as purse lipped breathing and diaphragmatic breathing and practicing self-pacing with activity.;Improve shortness of breath with ADL's Weight Management/Obesity;Develop more efficient breathing techniques such as purse lipped breathing and diaphragmatic breathing and practicing self-pacing with  activity.;Improve shortness of breath with ADL's Weight Management/Obesity;Develop more efficient breathing techniques such as purse lipped breathing and diaphragmatic breathing and practicing self-pacing with activity.;Improve shortness of breath with ADL's     Review Cannot assess pt's goals at this time due to pt scheduled to start on 6/11. We will continue to follw pt. Goal in progress for weight loss. Kaiyan has been working with our dietician for weight loss, he has lost weight since starting. Guenther is going through dental implants and has been on a soft diet. Goal met on developing more efficient breathing techniques such as purse lipped breathing and diaphragmatic breathing; and practicing self-pacing with activity. Goal progressing on improving his shortness of breath with ADLs. Sang has made improvement in the class and states that he can see a change in his breathing at home. Keyone will continue to benefit from participation in PR for nutrition, education, exercise and lifestyle modification. Goal in progress for weight loss. Neddie has been working with our dietician for weight loss, he has lost weight since starting. Faith is going through dental implants and has been on a soft diet. Goal met on developing more efficient breathing techniques such as purse lipped breathing and diaphragmatic breathing; and practicing self-pacing with activity. Goal progressing on improving his shortness of breath with ADLs. Lynkin has made improvement in the class and states that he can see a change in his breathing at home. Zia will continue to benefit from participation in PR for nutrition, education, exercise and lifestyle modification.     Expected Outcomes See admission goals See admission goals Goal in progress for weight loss. Brentlee's wife is beneficial in assisting him to increase his protein and caloric needs. Goal progressing on improving his shortness of breath with ADLs. Hayden's oxygen saturation has  maintained on room air. Goal met on developing more efficient breathing techniques such as purse lipped breathing and diaphragmatic breathing; and practicing self-pacing with activity. West can initiate breathing techniques without staff intervention. He is enjoying the program and states he can feel a difference.              Core Components/Risk Factors/Patient Goals at Discharge (Final Review):   Goals and Risk Factor Review - 01/23/23 0914       Core Components/Risk Factors/Patient Goals Review   Personal Goals Review Weight Management/Obesity;Develop more efficient breathing techniques such as purse lipped breathing and diaphragmatic breathing and practicing self-pacing with activity.;Improve shortness of breath with ADL's    Review Goal in progress for weight loss. Jagdish has been working with our dietician for weight loss, he has lost weight since starting. Ezel is going through dental implants and has been on a soft diet. Goal met on developing more efficient breathing techniques such as purse lipped breathing and diaphragmatic breathing; and practicing self-pacing with activity. Goal progressing on improving his shortness of breath with ADLs. Hildreth has made improvement in the class and states that he can see a change in his breathing at home. Wei will continue to benefit from participation in PR for nutrition, education, exercise and lifestyle modification.    Expected Outcomes Goal in progress for weight loss. Manav's wife is beneficial in assisting him to increase his protein  and caloric needs. Goal progressing on improving his shortness of breath with ADLs. Dequavious's oxygen saturation has maintained on room air. Goal met on developing more efficient breathing techniques such as purse lipped breathing and diaphragmatic breathing; and practicing self-pacing with activity. Jashawn can initiate breathing techniques without staff intervention. He is enjoying the program and states he can feel a  difference.             ITP Comments:Pt is making expected progress toward Pulmonary Rehab goals after completing 16 sessions. Recommend continued exercise, life style modification, education, and utilization of breathing techniques to increase stamina and strength, while also decreasing shortness of breath with exertion.  Dr. Mechele Collin is Medical Director for Pulmonary Rehab at [facility].     Comments: Dr. Mechele Collin is Medical Director for Pulmonary Rehab at [facility].

## 2023-01-29 ENCOUNTER — Encounter (HOSPITAL_COMMUNITY): Payer: Medicare Other

## 2023-02-03 ENCOUNTER — Encounter (HOSPITAL_COMMUNITY): Admission: RE | Admit: 2023-02-03 | Payer: Medicare Other | Source: Ambulatory Visit

## 2023-02-03 DIAGNOSIS — J984 Other disorders of lung: Secondary | ICD-10-CM | POA: Diagnosis not present

## 2023-02-03 NOTE — Progress Notes (Signed)
Daily Session Note  Patient Details  Name: [name] Park MRN: [id number] Date of Birth: 05-16-1951 Referring Provider:   Doristine Devoid Pulmonary Rehab Walk Test from 11/26/2022 in [facility], [facility]  Referring Provider [name]       Encounter Date: 02/03/2023  Check In:  Session Check In - 02/03/23 1413       Check-In   Supervising physician immediately available to respond to emergencies [facility] MD immediately available    Physician(s) Jari Favre, PA    Location [facility]    Staff Present Samantha Belarus, RD, LDN;Randi Dionisio Paschal, ACSM-CEP, Exercise Physiologist;Mary Gerre Scull, RN, BSN; , Zella Richer, MS, ACSM-CEP, Exercise Physiologist    Virtual Visit No    Medication changes reported     No    Fall or balance concerns reported    No    Tobacco Cessation No Change    Warm-up and Cool-down Performed as group-led instruction    Resistance Training Performed Yes    VAD Patient? No    PAD/SET Patient? No      Pain Assessment   Currently in Pain? No/denies    Multiple Pain Sites No             Capillary Blood Glucose: No results found for this or any previous visit (from the past 24 hour(s)).    Social History   Tobacco Use  Smoking Status Former   Current packs/day: 0.00   Average packs/day: 1.5 packs/day for 35.0 years (52.5 ttl pk-yrs)   Types: Cigarettes   Start date: 02/04/1969   Quit date: 02/05/2004   Years since quitting: 19.0  Smokeless Tobacco Never    Goals Met:  Proper associated with RPD/PD & O2 Sat Independence with exercise equipment Exercise tolerated well No report of concerns or symptoms today Strength training completed today  Goals Unmet:  Not Applicable  Comments: Service time is from 1318 to 1445.    Dr. Mechele Collin is Medical Director for Pulmonary Rehab at [facility].

## 2023-02-05 ENCOUNTER — Encounter (HOSPITAL_COMMUNITY)
Admission: RE | Admit: 2023-02-05 | Discharge: 2023-02-05 | Disposition: A | Discharge status: Home / Self Care | Payer: Medicare Other | Source: Ambulatory Visit | Attending: Pulmonary Disease | Admitting: Pulmonary Disease

## 2023-02-05 DIAGNOSIS — J984 Other disorders of lung: Secondary | ICD-10-CM

## 2023-02-05 NOTE — Progress Notes (Signed)
Daily Session Note  Patient Details  Name: [name] Park MRN: [id number] Date of Birth: 10-Jun-1951 Referring Provider:   Doristine Devoid Pulmonary Rehab Walk Test from 11/26/2022 in [facility], Vascular, & [facility]  Referring Provider [name]       Encounter Date: 02/05/2023  Check In:  Session Check In - 02/05/23 1333       Check-In   Supervising physician immediately available to respond to emergencies [facility] MD immediately available    Physician(s) Edd Fabian, NP    [facility]    Staff Present Raford Pitcher, MS, ACSM-CEP, Exercise Physiologist;Randi Dionisio Paschal, ACSM-CEP, Exercise Physiologist;Samantha Belarus, RD, Dutch Gray, RN, Fuller Plan, RT    Virtual Visit No    Medication changes reported     No    Fall or balance concerns reported    No    Tobacco Cessation No Change    Warm-up and Cool-down Performed as group-led instruction    Resistance Training Performed Yes    VAD Patient? No    PAD/SET Patient? No      Pain Assessment   Currently in Pain? No/denies    Multiple Pain Sites No             Capillary Blood Glucose: No results found for this or any previous visit (from the past 24 hour(s)).    Social History   Tobacco Use  Smoking Status Former   Current packs/day: 0.00   Average packs/day: 1.5 packs/day for 35.0 years (52.5 ttl pk-yrs)   Types: Cigarettes   Start date: 02/04/1969   Quit date: 02/05/2004   Years since quitting: 19.0  Smokeless Tobacco Never    Goals Met:  Proper associated with RPD/PD & O2 Sat Independence with exercise equipment Exercise tolerated well No report of concerns or symptoms today Strength training completed today  Goals Unmet:  Not Applicable  Comments: Service time is from 1312 to 1445.    Dr. Mechele Collin is Medical Director for Pulmonary Rehab at [facility].

## 2023-02-10 ENCOUNTER — Encounter (HOSPITAL_COMMUNITY)
Admission: RE | Admit: 2023-02-10 | Discharge: 2023-02-10 | Disposition: A | Discharge status: Home / Self Care | Payer: Medicare Other | Source: Ambulatory Visit | Attending: Pulmonary Disease | Admitting: Pulmonary Disease

## 2023-02-10 VITALS — Wt 178.6 lb

## 2023-02-10 DIAGNOSIS — J984 Other disorders of lung: Secondary | ICD-10-CM | POA: Diagnosis not present

## 2023-02-10 NOTE — Progress Notes (Signed)
Daily Session Note  Patient Details  Name: [name] Park MRN: [id number] Date of Birth: 1951-02-19 Referring Provider:   Doristine Devoid Pulmonary Rehab Walk Test from 11/26/2022 in [facility], Vascular, & Lung [facility]  Referring Provider [name]       Encounter Date: 02/10/2023  Check In:  Session Check In - 02/10/23 1445       Check-In   Supervising physician immediately available to respond to emergencies [facility] MD immediately available    Physician(s) Edd Fabian, NP    [facility]    Staff Present Raford Pitcher, MS, ACSM-CEP, Exercise Physiologist;Samantha Belarus, RD, Dutch Gray, RN, Fuller Plan, RT    Virtual Visit No    Medication changes reported     No    Fall or balance concerns reported    No    Tobacco Cessation No Change    Warm-up and Cool-down Performed as group-led instruction    Resistance Training Performed Yes    VAD Patient? No    PAD/SET Patient? No      Pain Assessment   Currently in Pain? No/denies    Multiple Pain Sites No             Capillary Blood Glucose: No results found for this or any previous visit (from the past 24 hour(s)).   Exercise Prescription Changes - 02/10/23 1400       Response to Exercise   Blood Pressure (Admit) 122/64    Blood Pressure (Exercise) 142/70    Blood Pressure (Exit) 108/52    Heart Rate (Admit) 52 bpm    Heart Rate (Exercise) 102 bpm    Heart Rate (Exit) 60 bpm    Oxygen Saturation (Admit) 100 %    Oxygen Saturation (Exercise) 97 %    Oxygen Saturation (Exit) 98 %    Rating of Perceived Exertion (Exercise) 13    Perceived Dyspnea (Exercise) 3    Duration Continue with 30 min of aerobic exercise without signs/symptoms of physical distress.    Intensity THRR unchanged      Progression   Progression Continue to progress workloads to maintain intensity without signs/symptoms of physical distress.      Resistance Training   Training  Prescription Yes    Weight blue bands    Reps 10-15    Time 10 Minutes      Treadmill   MPH 3.2    Grade 2    Minutes 15    METs 4.33      Rower   Level 4    Watts 41    Minutes 15             Social History   Tobacco Use  Smoking Status Former   Current packs/day: 0.00   Average packs/day: 1.5 packs/day for 35.0 years (52.5 ttl pk-yrs)   Types: Cigarettes   Start date: 02/04/1969   Quit date: 02/05/2004   Years since quitting: 19.0  Smokeless Tobacco Never    Goals Met:  Proper associated with RPD/PD & O2 Sat Independence with exercise equipment Exercise tolerated well No report of concerns or symptoms today Strength training completed today  Goals Unmet:  Not Applicable  Comments: Service time is from 1312 to 1435.    Dr. Mechele Collin is Medical Director for Pulmonary Rehab at [facility].

## 2023-02-12 ENCOUNTER — Encounter (HOSPITAL_COMMUNITY)
Admission: RE | Admit: 2023-02-12 | Discharge: 2023-02-12 | Disposition: A | Discharge status: Home / Self Care | Payer: Medicare Other | Source: Ambulatory Visit | Attending: Pulmonary Disease | Admitting: Pulmonary Disease

## 2023-02-12 DIAGNOSIS — M79645 Pain in left finger(s): Secondary | ICD-10-CM | POA: Diagnosis not present

## 2023-02-12 DIAGNOSIS — J984 Other disorders of lung: Secondary | ICD-10-CM

## 2023-02-12 NOTE — Progress Notes (Signed)
Daily Session Note  Patient Details  Name: [name] Park MRN: [id number] Date of Birth: 1951/06/21 Referring Provider:   Doristine Devoid Pulmonary Rehab Walk Test from 11/26/2022 in [facility] for Heart, Vascular, & Lung Health  Referring Provider [name]       Encounter Date: 02/12/2023  Check In:  Session Check In - 02/12/23 1326       Check-In   Supervising physician immediately available to respond to emergencies [facility] immediately available    Physician(s) Robin Searing, NP    [facility]    Staff Present Raford Pitcher, MS, ACSM-CEP, Exercise Physiologist;Samantha Belarus, RD, Dutch Gray, RN, BSN;Casey Smith, RT;Emonii Wienke BS, ACSM-CEP, Exercise Physiologist    Virtual Visit No    Medication changes reported     No    Fall or balance concerns reported    No    Tobacco Cessation No Change    Warm-up and Cool-down Performed as group-led instruction    Resistance Training Performed Yes    VAD Patient? No    PAD/SET Patient? No      Pain Assessment   Currently in Pain? No/denies    Multiple Pain Sites No             Capillary Blood Glucose: No results found for this or any previous visit (from the past 24 hour(s)).    Social History   Tobacco Use  Smoking Status Former   Current packs/day: 0.00   Average packs/day: 1.5 packs/day for 35.0 years (52.5 ttl pk-yrs)   Types: Cigarettes   Start date: 02/04/1969   Quit date: 02/05/2004   Years since quitting: 19.0  Smokeless Tobacco Never    Goals Met:  Independence with exercise equipment Exercise tolerated well No report of concerns or symptoms today Strength training completed today  Goals Unmet:  Not Applicable  Comments: Service time is from 1313 to 1448.    Dr. Mechele Collin is Medical Director for Pulmonary Rehab at [facility].

## 2023-02-16 DIAGNOSIS — Z961 Presence of intraocular lens: Secondary | ICD-10-CM | POA: Diagnosis not present

## 2023-02-16 DIAGNOSIS — H31002 Unspecified chorioretinal scars, left eye: Secondary | ICD-10-CM | POA: Diagnosis not present

## 2023-02-16 DIAGNOSIS — H26492 Other secondary cataract, left eye: Secondary | ICD-10-CM | POA: Diagnosis not present

## 2023-02-17 ENCOUNTER — Encounter (HOSPITAL_COMMUNITY)
Admission: RE | Admit: 2023-02-17 | Discharge: 2023-02-17 | Disposition: A | Discharge status: Home / Self Care | Payer: Medicare Other | Source: Ambulatory Visit | Attending: Pulmonary Disease | Admitting: Pulmonary Disease

## 2023-02-17 DIAGNOSIS — J984 Other disorders of lung: Secondary | ICD-10-CM | POA: Diagnosis not present

## 2023-02-17 NOTE — Progress Notes (Signed)
Daily Session Note  Patient Details  Name: [name] Park MRN: [id number] Date of Birth: 18-Nov-1950 Referring Provider:   Doristine Devoid Pulmonary Rehab Walk Test from 11/26/2022 in [facility] for Heart, Vascular, & Lung Health  Referring Provider [name]       Encounter Date: 02/17/2023  Check In:  Session Check In - 02/17/23 1330       Check-In   Supervising physician immediately available to respond to emergencies [facility] MD immediately available    Physician(s) Carlyon Shadow, NP    [facility]    Staff Present Samantha Belarus, RD, Dutch Gray, RN, BSN;Randi Reeve BS, ACSM-CEP, Exercise Physiologist;Kaylee Earlene Plater, MS, ACSM-CEP, Exercise Physiologist;Nilan Iddings Katrinka Blazing, RT    Virtual Visit No    Medication changes reported     No    Fall or balance concerns reported    No    Tobacco Cessation No Change    Warm-up and Cool-down Performed as group-led instruction    Resistance Training Performed Yes    VAD Patient? No    PAD/SET Patient? No      Pain Assessment   Currently in Pain? No/denies    Multiple Pain Sites No             Capillary Blood Glucose: No results found for this or any previous visit (from the past 24 hour(s)).    Social History   Tobacco Use  Smoking Status Former   Current packs/day: 0.00   Average packs/day: 1.5 packs/day for 35.0 years (52.5 ttl pk-yrs)   Types: Cigarettes   Start date: 02/04/1969   Quit date: 02/05/2004   Years since quitting: 19.0  Smokeless Tobacco Never    Goals Met:  Proper associated with RPD/PD & O2 Sat Independence with exercise equipment Exercise tolerated well No report of concerns or symptoms today Strength training completed today  Goals Unmet:  Not Applicable  Comments: Service time is from 1318 to 1446.    Dr. Mechele Collin is Medical Director for Pulmonary Rehab at [facility].

## 2023-02-19 ENCOUNTER — Encounter (HOSPITAL_COMMUNITY)
Admission: RE | Admit: 2023-02-19 | Discharge: 2023-02-19 | Disposition: A | Discharge status: Home / Self Care | Payer: Medicare Other | Source: Ambulatory Visit | Attending: Pulmonary Disease | Admitting: Pulmonary Disease

## 2023-02-19 DIAGNOSIS — M65312 Trigger thumb, left thumb: Secondary | ICD-10-CM | POA: Diagnosis not present

## 2023-02-19 DIAGNOSIS — J984 Other disorders of lung: Secondary | ICD-10-CM

## 2023-02-19 NOTE — Progress Notes (Signed)
Daily Session Note  Patient Details  Name: ROMALDO KULHANEK MRN: [id number] Date of Birth: 1951/03/02 Referring Provider:   Doristine Devoid Pulmonary Rehab Walk Test from 11/26/2022 in [facility] Heart, Vascular, & Lung Health  Referring Provider [name]       Encounter Date: 02/19/2023  Check In:  Session Check In - 02/19/23 1335       Check-In   Supervising physician immediately available to respond to emergencies [facility] MD immediately available    Physician(s) Bernadene Person, NP    [facility]    Staff Present Elissa Lovett BS, ACSM-CEP, Exercise Physiologist;Kaylee Earlene Plater, MS, ACSM-CEP, Exercise Physiologist;Casey Thedore Mins, RN, BSN    Virtual Visit No    Medication changes reported     No    Fall or balance concerns reported    No    Tobacco Cessation No Change    Warm-up and Cool-down Performed as group-led instruction    Resistance Training Performed Yes    VAD Patient? No    PAD/SET Patient? No      Pain Assessment   Currently in Pain? No/denies    Multiple Pain Sites No             Capillary Blood Glucose: No results found for this or any previous visit (from the past 24 hour(s)).    Social History   Tobacco Use  Smoking Status Former   Current packs/day: 0.00   Average packs/day: 1.5 packs/day for 35.0 years (52.5 ttl pk-yrs)   Types: Cigarettes   Start date: 02/04/1969   Quit date: 02/05/2004   Years since quitting: 19.0  Smokeless Tobacco Never    Goals Met:  Proper associated with RPD/PD & O2 Sat Independence with exercise equipment Improved SOB with ADL's Changing diet to healthy choices, watching portion sizes Exercise tolerated well No report of concerns or symptoms today Strength training completed today  Goals Unmet:  Not Applicable  Comments: Pt completed 6 min walk test and graduated. Service time is from 1303 to 1323.    Dr. Mechele Collin is Medical Director for  Pulmonary Rehab at [facility].

## 2023-02-20 ENCOUNTER — Telehealth: Payer: Self-pay | Admitting: *Deleted

## 2023-02-20 NOTE — Telephone Encounter (Signed)
   Pre-operative Risk Assessment    Patient Name: [name] Park  DOB: 07-22-1950 MRN: [id number]      Request for Surgical Clearance    Procedure:   Left thumb A1  Pulley release  Date of Surgery:  Clearance TBD                                 Surgeon:  Dr. Waylan Rocher Surgeon's Group or Practice Name:  [facility] Phone number:  [phone] Fax number:  [phone]   Type of Clearance Requested:   - Medical  - Pharmacy:  Hold Aspirin Not Indicated   Type of Anesthesia:  Not Indicated   Additional requests/questions:    Signed, Emmit Pomfret   02/20/2023, 2:16 PM

## 2023-02-20 NOTE — Telephone Encounter (Signed)
   Name: [name] Park  DOB: 07/15/1950  MRN: [id number]   Primary Cardiologist: Thurmon Fair, MD  Chart reviewed as part of pre-operative protocol coverage. Patient was contacted 02/20/2023 in reference to pre-operative risk assessment for pending surgery as outlined below.  [name] Park was last seen on 01/06/2023 by [name] Leu, PA.  Since that day, [name] Park has done well.  He denies any new symptoms or concerns.  He is able to complete greater than 4 METS without difficulty.  Therefore, based on ACC/AHA guidelines, the patient would be at acceptable risk for the planned procedure without further cardiovascular testing.   The patient was advised that if he develops new symptoms prior to surgery to contact our office to arrange for a follow-up visit, and he verbalized understanding.  Per office protocol, he may hold Aspirin for 5-7 days prior to procedure. Please resume Aspirin as soon as possible postprocedure, at the discretion of the surgeon.   I will route this recommendation to the requesting party via Epic fax function and remove from pre-op pool. Please call with questions.  Joylene Grapes, NP 02/20/2023, 4:18 PM

## 2023-02-25 NOTE — Progress Notes (Signed)
Discharge Progress Report  Patient Details  Name: [name] Park MRN: [id number] Date of Birth: 1950-11-21 Referring Provider:   Doristine Devoid Pulmonary Rehab Walk Test from 11/26/2022 in [facility]  Referring Provider [name]        Number of Visits: 22  Reason for Discharge:  Patient reached a stable level of exercise. Patient independent in their exercise. Patient has met program and personal goals.  Smoking History:  Social History   Tobacco Use  Smoking Status Former   Current packs/day: 0.00   Average packs/day: 1.5 packs/day for 35.0 years (52.5 ttl pk-yrs)   Types: Cigarettes   Start date: 02/04/1969   Quit date: 02/05/2004   Years since quitting: 19.0  Smokeless Tobacco Never    Diagnosis:  Restrictive lung disease  ADL [facility]SD:  Pulmonary Assessment Scores     Row Name 11/26/22 1105 02/10/23 1522 02/19/23 1520     ADL UCSD   ADL Phase Entry Exit Exit   SOB Score total 47 22 --     CAT Score   CAT Score 20 9 --     mMRC Score   mMRC Score 1 1 2             Initial Exercise Prescription:  Initial Exercise Prescription - 11/26/22 1100       Date of Initial Exercise RX and Referring Provider   Date 11/26/22    Referring Provider [name]    Expected Discharge Date 02/19/23      Treadmill   MPH 2.5    Grade 0    Minutes 15      Rower   Level 1    Watts 16    Minutes 15      Prescription Details   Frequency (times per week) 2    Duration Progress to 30 minutes of continuous aerobic without signs/symptoms of physical distress      Intensity   THRR 40-80% of Max Heartrate 59-118    Ratings of Perceived Exertion 11-13    Perceived Dyspnea 0-4      Progression   Progression Continue progressive overload as per policy without signs/symptoms or physical distress.      Resistance Training   Training Prescription Yes    Weight blue bands    Reps 10-15             Discharge  Exercise Prescription (Final Exercise Prescription Changes):  Exercise Prescription Changes - 02/10/23 1400       Response to Exercise   Blood Pressure (Admit) 122/64    Blood Pressure (Exercise) 142/70    Blood Pressure (Exit) 108/52    Heart Rate (Admit) 52 bpm    Heart Rate (Exercise) 102 bpm    Heart Rate (Exit) 60 bpm    Oxygen Saturation (Admit) 100 %    Oxygen Saturation (Exercise) 97 %    Oxygen Saturation (Exit) 98 %    Rating of Perceived Exertion (Exercise) 13    Perceived Dyspnea (Exercise) 3    Duration Continue with 30 min of aerobic exercise without signs/symptoms of physical distress.    Intensity THRR unchanged      Progression   Progression Continue to progress workloads to maintain intensity without signs/symptoms of physical distress.      Resistance Training   Training Prescription Yes    Weight blue bands    Reps 10-15    Time 10 Minutes      Treadmill  MPH 3.2    Grade 2    Minutes 15    METs 4.33      Rower   Level 4    Watts 41    Minutes 15             Functional Capacity:  6 Minute Walk     Row Name 11/26/22 1146 02/19/23 1517       6 Minute Walk   Phase Initial Discharge    Distance 1205 feet 1560 feet    Distance % Change -- 29.46 %    Distance Feet Change -- 355 ft    Walk Time 6 minutes 6 minutes    # of Rest Breaks 0 0    MPH 2.28 2.95    METS 2.61 3.25    RPE 11 9    Perceived Dyspnea  1 0    VO2 Peak 9.12 11.37    Symptoms No No    Resting HR 55 bpm 64 bpm    Resting BP 140/62 130/60    Resting Oxygen Saturation  96 % 99 %    Exercise Oxygen Saturation  during 6 min walk 94 % 97 %    Max Ex. HR 100 bpm 96 bpm    Max Ex. BP 132/62 130/60    2 Minute Post BP 118/60 140/52      Interval HR   1 Minute HR 94 80    2 Minute HR 100 96    3 Minute HR 100 89    4 Minute HR 97 90    5 Minute HR 95 95    6 Minute HR 99 91    2 Minute Post HR 61 60    Interval Heart Rate? Yes --      Interval Oxygen   Interval  Oxygen? Yes --    Baseline Oxygen Saturation % 96 % 99 %    1 Minute Oxygen Saturation % 100 % 98 %    1 Minute Liters of Oxygen 0 L 0 L    2 Minute Oxygen Saturation % 97 % 97 %    2 Minute Liters of Oxygen 0 L 0 L    3 Minute Oxygen Saturation % 91 % 97 %    3 Minute Liters of Oxygen 0 L 0 L    4 Minute Oxygen Saturation % 97 % 97 %    4 Minute Liters of Oxygen 0 L 0 L    5 Minute Oxygen Saturation % 98 % 97 %    5 Minute Liters of Oxygen 0 L 0 L    6 Minute Oxygen Saturation % 94 % 98 %    6 Minute Liters of Oxygen 0 L 0 L    2 Minute Post Oxygen Saturation % 100 % 99 %    2 Minute Post Liters of Oxygen 0 L 0 L             Psychological, QOL, Others - Outcomes: PHQ 2/9:    02/10/2023    3:22 PM 11/26/2022   11:03 AM 09/03/2022   11:30 AM 01/29/2022   10:07 PM 08/20/2021   11:29 AM  Depression screen PHQ 2/9  Decreased Interest 1 1 0 0 0  Down, Depressed, Hopeless 0 1 0 0 0  PHQ - 2 Score 1 2 0 0 0  Altered sleeping 0 0     Tired, decreased energy 1 0     Change in appetite 0 0  Feeling bad or failure about yourself  0 0     Trouble concentrating 0 1     Moving slowly or fidgety/restless 0 1     PHQ-9 Score 2 4     Difficult doing work/chores Not difficult at all Somewhat difficult       Quality of Life:   Personal Goals: Goals established at orientation with interventions provided to work toward goal.  Personal Goals and Risk Factors at Admission - 11/26/22 1042       Core Components/Risk Factors/Patient Goals on Admission    Weight Management Weight Loss    Improve shortness of breath with ADL's Yes    Intervention Provide education, individualized exercise plan and daily activity instruction to help decrease symptoms of SOB with activities of daily living.    Expected Outcomes Short Term: Improve cardiorespiratory fitness to achieve a reduction of symptoms when performing ADLs              Personal Goals Discharge:  Goals and Risk Factor Review      Row Name 11/28/22 0818 12/26/22 1608 01/23/23 0914 02/25/23 0839       Core Components/Risk Factors/Patient Goals Review   Personal Goals Review Weight Management/Obesity;Develop more efficient breathing techniques such as purse lipped breathing and diaphragmatic breathing and practicing self-pacing with activity.;Improve shortness of breath with ADL's Weight Management/Obesity;Develop more efficient breathing techniques such as purse lipped breathing and diaphragmatic breathing and practicing self-pacing with activity.;Improve shortness of breath with ADL's Weight Management/Obesity;Develop more efficient breathing techniques such as purse lipped breathing and diaphragmatic breathing and practicing self-pacing with activity.;Improve shortness of breath with ADL's Weight Management/Obesity;Improve shortness of breath with ADL's    Review Cannot assess pt's goals at this time due to pt scheduled to start on 6/11. We will continue to follw pt. Goal in progress for weight loss. Kase has been working with our dietician for weight loss, he has lost weight since starting. Kofi is going through dental implants and has been on a soft diet. Goal met on developing more efficient breathing techniques such as purse lipped breathing and diaphragmatic breathing; and practicing self-pacing with activity. Goal progressing on improving his shortness of breath with ADLs. Eleno has made improvement in the class and states that he can see a change in his breathing at home. Kerman will continue to benefit from participation in PR for nutrition, education, exercise and lifestyle modification. Goal in progress for weight loss. Taj has been working with our dietician for weight loss, he has lost weight since starting. Teagen is going through dental implants and has been on a soft diet. Goal met on developing more efficient breathing techniques such as purse lipped breathing and diaphragmatic breathing; and practicing  self-pacing with activity. Goal progressing on improving his shortness of breath with ADLs. Hallie has made improvement in the class and states that he can see a change in his breathing at home. Brekyn will continue to benefit from participation in PR for nutrition, education, exercise and lifestyle modification. Nyshaun graduated the program today, 02/19/23. Goal progressing at time of graduation for weight loss. Aadan is still in the process of his dental implants. He has gained about 2.5# since getting all his teeth pulled, and getting posts placed. He still wants to lose some weight but is incorporating more lean protein, fruits, and vegetables. Blayne had been on a soft/liquid diet previously due to his dental work. He has been working with out dietician and she has created an individualized plan  for his post-graduation. Goal met on decreasing his shortness of breath with ADLs at time of graduation. Sofia's post dyspnea scores went from a 47 to 22. His knowledge assessment went from a 14/18 to a 18/18. His CAT score also decreased from a 20 to a 9. This is all great progress. Eliseo exercising also made great progress increasing his METs from 2.91 to 4.77 and 13W on the rower to 42W. Kalief's post also increased by 30%. He states he has more energy during the day and is confident in his ability to exercise post-graduation on his own. His oxygen saturations have also stayed WNL during the program on room air. Cayde's MMRC did increase from 1 to 2. We are proud of the success Awesome has made in the program!    Expected Outcomes See admission goals See admission goals For Yavian to lose weight & improve his shortness of breath with ADLs For Tilman to continue to lose weight & improve his shortness of breath with ADLs post graduation on 02/19/23             Exercise Goals and Review:  Exercise Goals     Row Name 11/26/22 1044 12/30/22 1252 01/23/23 1521         Exercise Goals   Increase Physical Activity  Yes Yes Yes     Intervention Provide advice, education, support and counseling about physical activity/exercise needs.;Develop an individualized exercise prescription for aerobic and resistive training based on initial evaluation findings, risk stratification, comorbidities and participant's personal goals. Provide advice, education, support and counseling about physical activity/exercise needs.;Develop an individualized exercise prescription for aerobic and resistive training based on initial evaluation findings, risk stratification, comorbidities and participant's personal goals. Provide advice, education, support and counseling about physical activity/exercise needs.;Develop an individualized exercise prescription for aerobic and resistive training based on initial evaluation findings, risk stratification, comorbidities and participant's personal goals.     Expected Outcomes Short Term: Attend rehab on a regular basis to increase amount of physical activity.;Long Term: Exercising regularly at least 3-5 days a week.;Long Term: Add in home exercise to make exercise part of routine and to increase amount of physical activity. Short Term: Attend rehab on a regular basis to increase amount of physical activity.;Long Term: Exercising regularly at least 3-5 days a week.;Long Term: Add in home exercise to make exercise part of routine and to increase amount of physical activity. Short Term: Attend rehab on a regular basis to increase amount of physical activity.;Long Term: Exercising regularly at least 3-5 days a week.;Long Term: Add in home exercise to make exercise part of routine and to increase amount of physical activity.     Increase Strength and Stamina Yes Yes Yes     Intervention Develop an individualized exercise prescription for aerobic and resistive training based on initial evaluation findings, risk stratification, comorbidities and participant's personal goals.;Provide advice, education, support and  counseling about physical activity/exercise needs. Develop an individualized exercise prescription for aerobic and resistive training based on initial evaluation findings, risk stratification, comorbidities and participant's personal goals.;Provide advice, education, support and counseling about physical activity/exercise needs. Develop an individualized exercise prescription for aerobic and resistive training based on initial evaluation findings, risk stratification, comorbidities and participant's personal goals.;Provide advice, education, support and counseling about physical activity/exercise needs.     Expected Outcomes Short Term: Increase workloads from initial exercise prescription for resistance, speed, and METs.;Short Term: Perform resistance training exercises routinely during rehab and add in resistance training at home;Long Term: Improve  cardiorespiratory fitness, muscular endurance and strength as measured by increased METs and functional capacity ( ) Short Term: Increase workloads from initial exercise prescription for resistance, speed, and METs.;Short Term: Perform resistance training exercises routinely during rehab and add in resistance training at home;Long Term: Improve cardiorespiratory fitness, muscular endurance and strength as measured by increased METs and functional capacity ( ) Short Term: Increase workloads from initial exercise prescription for resistance, speed, and METs.;Short Term: Perform resistance training exercises routinely during rehab and add in resistance training at home;Long Term: Improve cardiorespiratory fitness, muscular endurance and strength as measured by increased METs and functional capacity ( )     Able to understand and use rate of perceived exertion (RPE) scale Yes Yes Yes     Intervention Provide education and explanation on how to use RPE scale Provide education and explanation on how to use RPE scale Provide education and explanation on how to use  RPE scale     Expected Outcomes Short Term: Able to use RPE daily in rehab to express subjective intensity level;Long Term:  Able to use RPE to guide intensity level when exercising independently Short Term: Able to use RPE daily in rehab to express subjective intensity level;Long Term:  Able to use RPE to guide intensity level when exercising independently Short Term: Able to use RPE daily in rehab to express subjective intensity level;Long Term:  Able to use RPE to guide intensity level when exercising independently     Able to understand and use Dyspnea scale Yes Yes Yes     Intervention Provide education and explanation on how to use Dyspnea scale Provide education and explanation on how to use Dyspnea scale Provide education and explanation on how to use Dyspnea scale     Expected Outcomes Short Term: Able to use Dyspnea scale daily in rehab to express subjective sense of shortness of breath during exertion;Long Term: Able to use Dyspnea scale to guide intensity level when exercising independently Short Term: Able to use Dyspnea scale daily in rehab to express subjective sense of shortness of breath during exertion;Long Term: Able to use Dyspnea scale to guide intensity level when exercising independently Short Term: Able to use Dyspnea scale daily in rehab to express subjective sense of shortness of breath during exertion;Long Term: Able to use Dyspnea scale to guide intensity level when exercising independently     Knowledge and understanding of Target Heart Rate Range (THRR) Yes Yes Yes     Intervention Provide education and explanation of THRR including how the numbers were predicted and where they are located for reference Provide education and explanation of THRR including how the numbers were predicted and where they are located for reference Provide education and explanation of THRR including how the numbers were predicted and where they are located for reference     Expected Outcomes Short Term:  Able to state/look up THRR;Long Term: Able to use THRR to govern intensity when exercising independently;Short Term: Able to use daily as guideline for intensity in rehab Short Term: Able to state/look up THRR;Long Term: Able to use THRR to govern intensity when exercising independently;Short Term: Able to use daily as guideline for intensity in rehab Short Term: Able to state/look up THRR;Long Term: Able to use THRR to govern intensity when exercising independently;Short Term: Able to use daily as guideline for intensity in rehab     Understanding of Exercise Prescription Yes Yes Yes     Intervention Provide education, explanation, and written materials on patient's individual exercise prescription Provide  education, explanation, and written materials on patient's individual exercise prescription Provide education, explanation, and written materials on patient's individual exercise prescription     Expected Outcomes Short Term: Able to explain program exercise prescription;Long Term: Able to explain home exercise prescription to exercise independently Short Term: Able to explain program exercise prescription;Long Term: Able to explain home exercise prescription to exercise independently Short Term: Able to explain program exercise prescription;Long Term: Able to explain home exercise prescription to exercise independently              Exercise Goals Re-Evaluation:  Exercise Goals Re-Evaluation     Row Name 11/26/22 1525 12/29/22 1009 01/23/23 1522 02/19/23 1521       Exercise Goal Re-Evaluation   Exercise Goals Review Increase Physical Activity;Able to understand and use Dyspnea scale;Understanding of Exercise Prescription;Increase Strength and Stamina;Knowledge and understanding of Target Heart Rate Range (THRR);Able to understand and use rate of perceived exertion (RPE) scale Increase Physical Activity;Able to understand and use Dyspnea scale;Understanding of Exercise Prescription;Increase  Strength and Stamina;Knowledge and understanding of Target Heart Rate Range (THRR);Able to understand and use rate of perceived exertion (RPE) scale Increase Physical Activity;Able to understand and use Dyspnea scale;Understanding of Exercise Prescription;Increase Strength and Stamina;Knowledge and understanding of Target Heart Rate Range (THRR);Able to understand and use rate of perceived exertion (RPE) scale Increase Physical Activity;Able to understand and use Dyspnea scale;Understanding of Exercise Prescription;Increase Strength and Stamina;Knowledge and understanding of Target Heart Rate Range (THRR);Able to understand and use rate of perceived exertion (RPE) scale    Comments Pt is scheduled to begin exercise on 6/11. Will monitor for progression. Antonio has completed 7 exercise sessions. He exercises for 15 min on the treadmill and rower. Diesel averages 3.53 METs at 2.8 mph and 1.0% incline on the treadmill and 33 watts on the rower. Chad performs the warmup and cooldown standing without limitations. He has increased his workload for both exercise modes as he tolerates progressions well. Mahari seems motivated to exercise and increase his functional capacity. Will continue to monitor and progress as able Louis has completed 15 exercise sessions. He exercises for 15 min on the treadmill and rower. Adir averages 3.92 METs at 3 mph and 1.5% incline on the treadmill and 38 watts on the rower. Trevar performs the warmup and cooldown standing without limitations. He has increased his workload for both exercise modes as he tolerates progressions well. Darryal seems motivated to exercise and increase his functional capacity. Will continue to monitor and progress as able Arael has completed 22 exercise sessions. He began at 2.91 METs and ended at 4.77 METs on the TM. He began at 13 watts on the rower and ended at 42 watts. His 6 min walk test improved by 30%. He is now exercising at home, walking 30 min daily. He has  had great success in the program and feels confident to continue exercising on his own.    Expected Outcomes Through exercise at rehab and at home, the patient will decrease shortness of breath with daily activities and feel confident in carrying out an exercise regime at home. Through exercise at rehab and at home, the patient will decrease shortness of breath with daily activities and feel confident in carrying out an exercise regime at home. Through exercise at rehab and at home, the patient will decrease shortness of breath with daily activities and feel confident in carrying out an exercise regime at home. Through exercise at rehab and at home, the patient will decrease shortness of  breath with daily activities and feel confident in carrying out an exercise regime at home.             Nutrition & Weight - Outcomes:  Pre Biometrics - 11/26/22 1224       Pre Biometrics   Grip Strength 35 kg             Post Biometrics - 02/19/23 1517        Post  Biometrics   Grip Strength 35 kg             Nutrition:  Nutrition Therapy & Goals - 01/27/23 1457       Nutrition Therapy   Diet Heart Healthy Diet    Drug/Food Interactions Statins/Certain Fruits      Personal Nutrition Goals   Nutrition Goal Patient to improve diet quality by using the plate method as a guide for meal planning to include lean protein/plant protein, fruits, vegetables, whole grains, nonfat dairy as part of a well-balanced diet.    Personal Goal #2 Patient to identify strategies for weight loss of 0.5-2.0# per week.    Comments Goals in progress. Fabio reports that he had dental surgery ~10 weeks ago (at 195#) and is awaiting lower implants; he is primarily eating soft foods/pureed foods. He will likely receive final implant in October. During the first 2 weeks post-op ,he reports weight loss of ~10#. He has now increased protein intake from soft food sources such as beans, eggs, protein powder smoothies,  protein shakes, etc to aid with maintenance of lean muscle mass.  He is mindful of sodium intake. He is down 7.5# since starting with our program; indicitive of more appropriate calorie deficit at this time. His wife is a good support. Shri will benefit from participation in pulmonary rehab for nutrition, exercise, and lifestyle changes.      Intervention Plan   Intervention Prescribe, educate and counsel regarding individualized specific dietary modifications aiming towards targeted core components such as weight, hypertension, lipid management, diabetes, heart failure and other comorbidities.;Nutrition handout(s) given to patient.    Expected Outcomes Short Term Goal: Understand basic principles of dietary content, such as calories, fat, sodium, cholesterol and nutrients.;Long Term Goal: Adherence to prescribed nutrition plan.             Nutrition Discharge:  Nutrition Assessments - 02/10/23 1451       Rate Your Plate Scores   Pre Score 66    Post Score 75             Education Questionnaire Score:  Knowledge Questionnaire Score - 02/10/23 1522       Knowledge Questionnaire Score   Post Score 18/18             Goals reviewed with patient; copy given to patient.  Dr. Mechele Collin is Medical Director for Pulmonary Rehab at [facility].

## 2023-02-25 NOTE — Addendum Note (Signed)
Encounter addended by: Essie Hart, RN on: 02/25/2023 8:18 AM  Actions taken: Flowsheet accepted

## 2023-02-26 ENCOUNTER — Other Ambulatory Visit: Payer: Self-pay | Admitting: Nurse Practitioner

## 2023-02-26 DIAGNOSIS — M65312 Trigger thumb, left thumb: Secondary | ICD-10-CM | POA: Diagnosis not present

## 2023-03-18 ENCOUNTER — Ambulatory Visit: Payer: Medicare Other | Admitting: Pulmonary Disease

## 2023-03-18 ENCOUNTER — Encounter: Payer: Self-pay | Admitting: Pulmonary Disease

## 2023-03-18 VITALS — BP 116/60 | HR 58 | Ht 69.0 in | Wt 175.6 lb

## 2023-03-18 DIAGNOSIS — R942 Abnormal results of pulmonary function studies: Secondary | ICD-10-CM | POA: Diagnosis not present

## 2023-03-18 DIAGNOSIS — J984 Other disorders of lung: Secondary | ICD-10-CM

## 2023-03-18 NOTE — Progress Notes (Signed)
Synopsis: Referred in February 2024 for abnormal pulmonary function tests  Subjective:   PATIENT ID: [name] Park GENDER: male DOB: 04-29-1951, MRN: [id number]  HPI  Chief Complaint  Patient presents with   Follow-up    Pt is here for Restrictive lung disease F/U visit. Pt states he has no concerns today.   [name] Park is a 72 year old male, former smoker with history of GERD, HLD, and hypertension who returns to pulmonary clinic for restrictive ventilatory defect on PFTs.   He reports adherence to his prescribed Trelegy inhaler and has noticed a significant reduction in wheezing since starting the medication. He recently completed a pulmonary rehabilitation program, which he found beneficial and enjoyable. Since completing the program, he has been maintaining physical activity by walking for approximately 30 minutes, covering a little over a mile, several times a week. He is considering joining a gym and continuing a structured exercise program, but is unsure about the cost and insurance coverage.  OV 09/23/22 He noted improvement in his breathing with Trelegy 1 puff daily and has continued on this. He has not had any more wheezing. Has not noticed much improvement in regards to exertional dyspnea. He remains very sedentary throughout the day.  HRCT Chest shows no changes in bibasilar scarring since 11/23/2018. There is mild lobular air trapping.   ONO showed he did not qualify for nocturnal oxygen therapy.   Initial OV 08/06/22 PFTs show mild restriction and mild diffusion defect.   He reports exertional dyspnea, cough that is occasionally productive and wheezing since the fall. He tried breztri inhaler 2 puffs twice daily for a couple weeks with out improvement in his symptoms. He reports a fairly sedentary life. He does yard work during the spring, summer and fall. He denies joint pains or rashes.  He quit smoking in 2005. He smoked 30+ years, 1-2 packs. His mother had emphysema  and lupus, she was a never smoker. He had second hand smoke exposure in childhood from his father. He retired from the [employer], 28 years. No pets at home. No water or mold damage at home.   Past Medical History:  Diagnosis Date   Acute appendicitis 11/24/2016   COPD (chronic obstructive pulmonary disease) (HCC)    GERD (gastroesophageal reflux disease)    Hematuria    HLD (hyperlipidemia)    Hypertension    IBS (irritable bowel syndrome)    Pre-diabetes    Vitamin D deficiency      Family History  Problem Relation Age of Onset   Irritable bowel syndrome Mother    Diverticulosis Mother    Lupus Mother    COPD Mother    Hypertension Mother    Lung cancer Father    Other Father        bladder polyps   Stroke Father    Kidney disease Father    Cancer Father    Colon cancer Neg Hx    Esophageal cancer Neg Hx    Rectal cancer Neg Hx    Stomach cancer Neg Hx      Social History   Socioeconomic History   Marital status: Married    Spouse name: Ruvim Zavatsky   Number of children: 1   Years of education: 12   Highest education level: 12th grade  Occupational History   Occupation: retired  Tobacco Use   Smoking status: Former    Current packs/day: 0.00    Average packs/day: 1.5 packs/day for 35.0 years (52.5 ttl pk-yrs)  Types: Cigarettes    Start date: 02/04/1969    Quit date: 02/05/2004    Years since quitting: 19.1   Smokeless tobacco: Never  Vaping Use   Vaping status: Never Used  Substance and Sexual Activity   Alcohol use: Yes    Comment: seldom   Drug use: No   Sexual activity: Not on file  Other Topics Concern   Not on file  Social History Narrative   Not on file   Social Determinants of Health   Financial Resource Strain: Not on file  Food Insecurity: Not on file  Transportation Needs: Not on file  Physical Activity: Not on file  Stress: Not on file  Social Connections: Not on file  Intimate Partner Violence: Not on file     No Known  Allergies   Outpatient Medications Prior to Visit  Medication Sig Dispense Refill   aspirin EC 81 MG tablet Take 81 mg by mouth daily. Swallow whole.     atorvastatin (LIPITOR) 40 MG tablet TAKE 1 TABLET BY MOUTH DAILY FOR CHOLESTEROL 100 tablet 2   bisoprolol (ZEBETA) 5 MG tablet TAKE 1 TABLET BY MOUTH DAILY FOR BLOOD PRESSURE 90 tablet 3   buPROPion (WELLBUTRIN XL) 150 MG 24 hr tablet TAKE 1 TABLET BY MOUTH DAILY FOR MOOD, FOCUS, AND CONCENTRATION 90 tablet 3   Cholecalciferol (VITAMIN D-3) 1000 UNITS CAPS Take 5,000 Units by mouth daily.      Fluticasone-Umeclidin-Vilant (TRELEGY ELLIPTA) 100-62.5-25 MCG/ACT AEPB Inhale 1 puff into the lungs daily. 60 each 4   furosemide (LASIX) 20 MG tablet Take 20 mg every other day 90 tablet 3   tamsulosin (FLOMAX) 0.4 MG CAPS capsule Take 1 tablet at Bedtime for Prostate 90 capsule 3   No facility-administered medications prior to visit.   Review of Systems  Constitutional:  Negative for chills, fever, malaise/fatigue and weight loss.  HENT:  Negative for congestion, sinus pain and sore throat.   Eyes: Negative.   Respiratory:  Positive for shortness of breath. Negative for cough, hemoptysis, sputum production and wheezing.   Cardiovascular:  Negative for chest pain, palpitations, orthopnea, claudication and leg swelling.  Gastrointestinal:  Negative for abdominal pain, heartburn, nausea and vomiting.  Genitourinary: Negative.   Musculoskeletal:  Negative for joint pain and myalgias.  Skin:  Negative for rash.  Neurological:  Negative for weakness.  Endo/Heme/Allergies: Negative.    Objective:   Vitals:   03/18/23 1339  BP: 116/60  Pulse: (!) 58  SpO2: 98%  Weight: 175 lb 9.6 oz (79.7 kg)  Height: 5\' 9"  (1.753 m)    Physical Exam Constitutional:      General: He is not in acute distress. HENT:     Head: Normocephalic and atraumatic.  Eyes:     Conjunctiva/sclera: Conjunctivae normal.  Cardiovascular:     Rate and Rhythm: Normal  rate and regular rhythm.     Pulses: Normal pulses.     Heart sounds: Normal heart sounds. No murmur heard. Pulmonary:     Effort: Pulmonary effort is normal.     Breath sounds: Normal breath sounds.  Musculoskeletal:     Right lower leg: No edema.     Left lower leg: No edema.  Skin:    General: Skin is warm and dry.  Neurological:     General: No focal deficit present.     Mental Status: He is alert.    CBC    Component Value Date/Time   WBC 5.9 12/22/2022 1027   RBC  4.05 (L) 12/22/2022 1027   HGB 12.3 (L) 12/22/2022 1027   HCT 36.3 (L) 12/22/2022 1027   PLT 282 12/22/2022 1027   MCV 89.6 12/22/2022 1027   MCH 30.4 12/22/2022 1027   MCHC 33.9 12/22/2022 1027   RDW 12.4 12/22/2022 1027   LYMPHSABS 1,145 12/22/2022 1027   MONOABS 0.8 12/12/2019 1119   EOSABS 301 12/22/2022 1027   BASOSABS 41 12/22/2022 1027      Latest Ref Rng & Units 12/22/2022   10:27 AM 09/03/2022   12:02 PM 06/25/2022   12:05 PM  BMP  Glucose 65 - 99 mg/dL 782  90  88   BUN 7 - 25 mg/dL 21  20  28    Creatinine 0.70 - 1.28 mg/dL 9.56  2.13  0.86   BUN/Creat Ratio 6 - 22 (calc) 14  13  16    Sodium 135 - 146 mmol/L 141  142  140   Potassium 3.5 - 5.3 mmol/L 4.7  4.7  5.0   Chloride 98 - 110 mmol/L 103  104  101   CO2 20 - 32 mmol/L 29  28  27    Calcium 8.6 - 10.3 mg/dL 9.7  9.5  57.8    Chest imaging: HRCT Chest 08/25/22 1. Bland appearing bibasilar scarring or atelectasis, unchanged compared to prior examination dated 11/23/2018. No evidence of fibrotic interstitial lung disease. 2. Mild lobular air trapping on expiratory phase imaging, suggestive of small airways disease. 3. Coronary artery disease.  CTA Coronary Scan 07/01/22 Scattered pulmonary parenchymal scarring. Small hiatal hernia. Calcified granulomas.  PFT:    Latest Ref Rng & Units 07/17/2022   12:20 PM  PFT Results  FVC-Pre L 2.73   FVC-Predicted Pre % 65   FVC-Post L 2.74   FVC-Predicted Post % 65   Pre FEV1/FVC % % 77    Post FEV1/FCV % % 79   FEV1-Pre L 2.12   FEV1-Predicted Pre % 70   FEV1-Post L 2.17   DLCO uncorrected ml/min/mmHg 17.75   DLCO UNC% % 71   DLVA Predicted % 103   TLC L 4.97   TLC % Predicted % 72   RV % Predicted % 91     Labs:  Path:  Echo 05/2022: LV EF 60-65%. RV systolic function and size are normal.   Heart Catheterization:     Assessment & Plan:   Restrictive lung disease - Plan: Pulmonary Function Test  Decreased diffusion capacity - Plan: Pulmonary Function Test  Discussion: Colonel Bitzer is a 72 year old male, former smoker with history of GERD, HLD, and hypertension who returns to pulmonary clinic for restrictive defect on PFTs   He has mild restriction and diffusion defect on PFTs. HRCT Chest shows no changes in scattered areas of scarring of the bilateral bases since 2020  He is to continue on trelegy ellipta 1 puff daily as he has noted improvement in his cough and wheezing.  He is to continue walking up to 30 minutes 5 days per week to maintain his activity level after completing pulmonary rehab.   Follow up in 1 year with PFTs.  Melody Comas, MD  Pulmonary & Critical Care Office: [phone]     Current Outpatient Medications:    aspirin EC 81 MG tablet, Take 81 mg by mouth daily. Swallow whole., Disp: , Rfl:    atorvastatin (LIPITOR) 40 MG tablet, TAKE 1 TABLET BY MOUTH DAILY FOR CHOLESTEROL, Disp: 100 tablet, Rfl: 2   bisoprolol (ZEBETA) 5 MG tablet, TAKE  1 TABLET BY MOUTH DAILY FOR BLOOD PRESSURE, Disp: 90 tablet, Rfl: 3   buPROPion (WELLBUTRIN XL) 150 MG 24 hr tablet, TAKE 1 TABLET BY MOUTH DAILY FOR MOOD, FOCUS, AND CONCENTRATION, Disp: 90 tablet, Rfl: 3   Cholecalciferol (VITAMIN D-3) 1000 UNITS CAPS, Take 5,000 Units by mouth daily. , Disp: , Rfl:    Fluticasone-Umeclidin-Vilant (TRELEGY ELLIPTA) 100-62.5-25 MCG/ACT AEPB, Inhale 1 puff into the lungs daily., Disp: 60 each, Rfl: 4   furosemide (LASIX) 20 MG tablet, Take 20 mg  every other day, Disp: 90 tablet, Rfl: 3   tamsulosin (FLOMAX) 0.4 MG CAPS capsule, Take 1 tablet at Bedtime for Prostate, Disp: 90 capsule, Rfl: 3

## 2023-03-18 NOTE — Patient Instructions (Addendum)
Continue trelegy ellipta 1 puff daily  Follow up in 1 year with pulmonary function tests

## 2023-03-24 DIAGNOSIS — N3281 Overactive bladder: Secondary | ICD-10-CM | POA: Diagnosis not present

## 2023-03-24 DIAGNOSIS — R3912 Poor urinary stream: Secondary | ICD-10-CM | POA: Diagnosis not present

## 2023-03-30 ENCOUNTER — Encounter: Payer: Self-pay | Admitting: Internal Medicine

## 2023-03-31 ENCOUNTER — Ambulatory Visit (INDEPENDENT_AMBULATORY_CARE_PROVIDER_SITE_OTHER): Payer: Medicare Other

## 2023-03-31 ENCOUNTER — Other Ambulatory Visit: Payer: Self-pay | Admitting: Nurse Practitioner

## 2023-03-31 VITALS — Temp 97.8°F

## 2023-03-31 DIAGNOSIS — Z23 Encounter for immunization: Secondary | ICD-10-CM

## 2023-05-06 ENCOUNTER — Other Ambulatory Visit: Payer: Self-pay | Admitting: Nurse Practitioner

## 2023-05-14 NOTE — Progress Notes (Signed)
Complete Physical Exam  Assessment / Plan:    Encounter for general adult medical examination with abnormal findings Yearly  Essential hypertension - decrease bisoprolol 5 mg to 1/2 tab every day and furosemide 20 mg every other day - Follow up in 2 weeks and bring BP/pulse log - Continue DASH diet, exercise and monitor at home. Call if greater than 130/80.  -     CBC with Differential/Platelet -     CMP/GFR -     TSH  Mixed hyperlipidemia Continue medications: Atorvastatin 40mg , three days a week Discussed dietary and exercise modifications Low fat diet - Lipid panel  Thoracic aorta atherosclerosis (HCC) Per numerous CXR/CTs 10/15/19 Control blood pressure, cholesterol, glucose, increase exercise.   COPD (chronic obstructive pulmonary disease) with chronic bronchitis (HCC) Former smoker, dx per imaging, dyspnea with exertion Continue to monitor symptoms Just finished 12 weeks of pulmonary rehab Continue to follow with Dr. Francine Graven  Chronic kidney disease, stage 3 (HCC) Increase fluids, avoid NSAIDS, monitor sugars, will monitor -     CMP WITH GFR   Edema Worsening over the past several months with more shortness of breath Encouraged compression stockings Refer to cardiology   Other abnormal glucose Discussed disease progression and risks Discussed diet/exercise, weight management and risk modification - A1c  Vitamin D deficiency Continue supplement - Vit D  Generalized anxiety disorder Stop Wellbutrin and monitor symptoms. Continue diet, exercise, practice good sleep hygiene  Overweight (BMI 25.0-29.9) - long discussion about weight loss, diet, and exercise -recommended diet heavy in fruits and veggies and low in animal meats, cheeses, and dairy products  54 pack year smoking history/lung nodules 54 pack, no symptoms, quit 15 years ago Last year he was advised he could not get insurance to cover the Low Dose CT, will order CXR  Screening for Ischemic  Heart Disease EKG  Screening for AAA - ABD U/S Retro LTD  Screening for hematuria or proteinuria UA routine with reflex microscopic Microalbumin/creatinine urine ratio  Screening for prostate CA - PSA   Screening for thyroid disorder - TSH       Future Appointments  Date Time Provider Department Center  08/19/2023  4:20 PM Croitoru, Rachelle Hora, MD [facility] None  08/24/2023 11:30 AM Raynelle Dick, NP [facility] None  11/23/2023 11:30 AM Lucky Cowboy, MD [facility] None  05/18/2024 10:00 AM Raynelle Dick, NP [facility] None   72 y.o. male  presents for 3 month follow up with hypertension, hyperlipidemia, prediabetes and vitamin D and CPE.   He is on tamsulosin for OAB per Dr. Vernie Ammons. He is waking up once a night and sometimes not at all.  He is on wellbutrin 150 mg and is taking every other day for anxiety/depression which is controlling symptoms . Started initially to stop smoking which he did in 2005.   BMI is Body mass index is 25.67 kg/m., he has not been working on diet and exercise, walks more when warm, will restart in the spring. He has been undergoing dental implant process from May until yesterday Wt Readings from Last 3 Encounters:  05/15/23 173 lb 12.8 oz (78.8 kg)  03/18/23 175 lb 9.6 oz (79.7 kg)  02/10/23 178 lb 9.2 oz (81 kg)    His blood pressure has been controlled at home on bisoprolol 5 mg every day and furosemide 20 mg every day , today their BP is BP: 118/60.   BP Readings from Last 3 Encounters:  05/15/23 118/60  03/18/23 116/60  01/09/23 128/66  He does not workout. He denies chest pain, dizziness, shortness of breath and headaches  Has been noticing his pulse is running in 40's at night Pulse Readings from Last 3 Encounters:  05/15/23 61  03/18/23 (!) 58  01/09/23 76     He is a former smoker, 54 pack years, quit in 2005, does have diagnosis of COPD via CXR in 2013, today he endorses ongoing exertional dyspnea with exercise, denies  fatigue, CP or other accompaniments. He currently on Trelegy inhaler. Follows with Dr. Francine Graven- last appointment 03/18/23  CT chest high resolution was done 08/25/22, ordered by Dr. Thana Ates appearing bibasilar scarring or atelectasis, unchanged compared to prior examination dated 11/23/2018. No evidence of fibrotic interstitial lung disease. 2. Mild lobular air trapping on expiratory phase imaging, suggestive of small airways disease. 3. Coronary artery disease. Aortic Atherosclerosis (ICD10-I70.0).   He is on cholesterol medication, he is taking atorvastatin 40 mg 3 days a week and denies myalgias. His cholesterol is at goal. The cholesterol last visit was:   Lab Results  Component Value Date   CHOL 118 12/22/2022   HDL 48 12/22/2022   LDLCALC 55 12/22/2022   TRIG 72 12/22/2022   CHOLHDL 2.5 12/22/2022    He has been working on diet and exercise for prediabetes, and denies foot ulcerations, hyperglycemia, hypoglycemia , increased appetite, nausea, paresthesia of the feet, polydipsia, polyuria, visual disturbances, vomiting and weight loss. Last A1C in the office was:  Lab Results  Component Value Date   HGBA1C 5.8 (H) 12/22/2022   Patient is on Vitamin D supplement.  Lab Results  Component Value Date   VD25OH 77 12/22/2022    He has labile CKD III GFR values which are monitored closely.  Lab Results  Component Value Date   EGFR 48 (L) 12/22/2022      Current Medications:  Current Outpatient Medications on File Prior to Visit  Medication Sig Dispense Refill   aspirin EC 81 MG tablet Take 81 mg by mouth daily. Swallow whole.     atorvastatin (LIPITOR) 40 MG tablet TAKE 1 TABLET BY MOUTH DAILY FOR CHOLESTEROL 100 tablet 2   bisoprolol (ZEBETA) 5 MG tablet TAKE 1 TABLET BY MOUTH DAILY FOR BLOOD PRESSURE 100 tablet 2   buPROPion (WELLBUTRIN XL) 150 MG 24 hr tablet TAKE 1 TABLET BY MOUTH DAILY FOR MOOD, FOCUS, AND CONCENTRATION 90 tablet 3   Cholecalciferol (VITAMIN D-3)  1000 UNITS CAPS Take 5,000 Units by mouth daily.      Fluticasone-Umeclidin-Vilant (TRELEGY ELLIPTA) 100-62.5-25 MCG/ACT AEPB Inhale 1 puff into the lungs daily. 60 each 4   furosemide (LASIX) 20 MG tablet Take 20 mg every other day 90 tablet 3   tamsulosin (FLOMAX) 0.4 MG CAPS capsule Take 1 tablet at Bedtime for Prostate 90 capsule 3   No current facility-administered medications on file prior to visit.    Medical History:  Past Medical History:  Diagnosis Date   Acute appendicitis 11/24/2016   COPD (chronic obstructive pulmonary disease) (HCC)    GERD (gastroesophageal reflux disease)    Hematuria    HLD (hyperlipidemia)    Hypertension    IBS (irritable bowel syndrome)    Pre-diabetes    Vitamin D deficiency    Preventative care:  Immunization History  Administered Date(s) Administered   Influenza Whole 04/07/2013   Influenza, High Dose Seasonal PF 04/13/2014, 03/27/2015, 03/25/2016, 04/08/2018, 04/14/2019, 05/10/2020, 05/14/2021, 05/14/2022, 03/31/2023   Influenza-Unspecified 03/31/2017   PFIZER Comirnaty(Gray Top)Covid-19 Tri-Sucrose Vaccine 11/12/2020, 05/28/2022  PFIZER(Purple Top)SARS-COV-2 Vaccination 07/15/2019, 08/04/2019, 03/28/2020   PNEUMOCOCCAL CONJUGATE-20 05/14/2021   PPD Test 12/05/2013   Pfizer Covid-19 Vaccine Bivalent Booster 29yrs & up 04/05/2021   Pneumococcal Conjugate-13 06/08/2014   Pneumococcal Polysaccharide-23 09/07/2014   Td 08/20/2021   Tdap 04/29/2011   Zoster, Live 06/12/2014   Health Maintenance  Topic Date Due   Zoster Vaccines- Shingrix (1 of 2) 04/04/1968   COVID-19 Vaccine (7 - 2023-24 season) 02/22/2023   Medicare Annual Wellness (AWV)  09/03/2023   Colonoscopy  02/27/2026   DTaP/Tdap/Td (3 - Td or Tdap) 08/21/2031   Pneumonia Vaccine 68+ Years old  Completed   INFLUENZA VACCINE  Completed   Hepatitis C Screening  Completed   HPV VACCINES  Aged Out     Names of Other Physician/Practitioners you currently use: 1. [facility]- here for primary care 2. Dr. Antony Contras, eye doctor, last visit 02/16/23 3. Dr. Margarita Grizzle, last visit 2024  Patient Care Team: Lucky Cowboy, MD as PCP - General (Internal Medicine) Thurmon Fair, MD as PCP - Cardiology (Cardiology) Antony Contras, MD as Consulting Physician (Ophthalmology) Meryl Dare, MD as Consulting Physician (Gastroenterology) Sundra Aland, [facility] (Inactive) as Pharmacist (Pharmacist)    Allergies No Known Allergies  SURGICAL HISTORY He  has a past surgical history that includes Cervical fusion; Trigger finger release (Right); Carpal tunnel release (Right); Hand surgery (Left); laparoscopic appendectomy (N/A, 11/24/2016); and Cataract extraction, bilateral (Bilateral). FAMILY HISTORY His family history includes COPD in his mother; Cancer in his father; Diverticulosis in his mother; Hypertension in his mother; Irritable bowel syndrome in his mother; Kidney disease in his father; Lung cancer in his father; Lupus in his mother; Other in his father; Stroke in his father. SOCIAL HISTORY He  reports that he quit smoking about 19 years ago. His smoking use included cigarettes. He started smoking about 54 years ago. He has a 52.5 pack-year smoking history. He has never used smokeless tobacco. He reports current alcohol use. He reports that he does not use drugs.    Review of Systems:  Review of Systems  Constitutional:  Negative for chills, fever and malaise/fatigue.  HENT:  Negative for congestion, ear pain and sore throat.   Respiratory:  Negative for cough, shortness of breath and wheezing.   Cardiovascular:  Negative for chest pain, palpitations and leg swelling.       Pulse running in 40's  Gastrointestinal:  Negative for abdominal pain, blood in stool, constipation, diarrhea, heartburn and melena.  Genitourinary: Negative.  Negative for dysuria.  Musculoskeletal:  Negative for back pain, joint pain and myalgias.   Skin: Negative.   Neurological:  Negative for dizziness, sensory change, loss of consciousness and headaches.  Endo/Heme/Allergies:  Does not bruise/bleed easily.  Psychiatric/Behavioral:  Negative for depression. The patient is not nervous/anxious and does not have insomnia.     Physical Exam: BP 118/60   Pulse 61   Temp 97.9 F (36.6 C)   Resp 16   Ht 5\' 9"  (1.753 m)   Wt 173 lb 12.8 oz (78.8 kg)   SpO2 98%   BMI 25.67 kg/m  Wt Readings from Last 3 Encounters:  05/15/23 173 lb 12.8 oz (78.8 kg)  03/18/23 175 lb 9.6 oz (79.7 kg)  02/10/23 178 lb 9.2 oz (81 kg)    General Appearance: Well nourished well developed, in no apparent distress. Eyes: PERRLA, EOMs, conjunctiva no swelling or erythema ENT/Mouth: Ear canals cerumen impaction bilaterally. TMs normal bilaterally.  Oropharynx  moist, clear, without exudate, or postoropharyngeal swelling.  Neck: Supple, thyroid normal,no cervical adenopathy  Respiratory: Respiratory effort normal, Breath sounds clear A&P without rhonchi, wheeze, or rale.  No retractions, no accessory usage. Cardio: RRR with no MRGs. Brisk peripheral pulses with no edema Abdomen: Soft, + BS,  Non tender, no guarding, rebound, hernias, masses. Musculoskeletal: Full ROM, 5/5 strength, Normal gait Skin: Warm, dry without rashes, lesions, ecchymosis.  Neuro: Awake and oriented X 3, Cranial nerves intact. Normal muscle tone, no cerebellar symptoms. Psych: Normal affect, Insight and Judgment appropriate.   EKG: Sinus bradycardia, no ST changes AAA: < 3 cm  [facility]  05/15/2023

## 2023-05-15 ENCOUNTER — Ambulatory Visit (INDEPENDENT_AMBULATORY_CARE_PROVIDER_SITE_OTHER): Payer: Medicare Other | Admitting: Nurse Practitioner

## 2023-05-15 ENCOUNTER — Encounter: Payer: Self-pay | Admitting: Nurse Practitioner

## 2023-05-15 VITALS — BP 118/60 | HR 61 | Temp 97.9°F | Resp 16 | Ht 69.0 in | Wt 173.8 lb

## 2023-05-15 DIAGNOSIS — Z136 Encounter for screening for cardiovascular disorders: Secondary | ICD-10-CM | POA: Diagnosis not present

## 2023-05-15 DIAGNOSIS — Z79899 Other long term (current) drug therapy: Secondary | ICD-10-CM

## 2023-05-15 DIAGNOSIS — Z1329 Encounter for screening for other suspected endocrine disorder: Secondary | ICD-10-CM

## 2023-05-15 DIAGNOSIS — Z0000 Encounter for general adult medical examination without abnormal findings: Secondary | ICD-10-CM | POA: Diagnosis not present

## 2023-05-15 DIAGNOSIS — F411 Generalized anxiety disorder: Secondary | ICD-10-CM

## 2023-05-15 DIAGNOSIS — I700 Atherosclerosis of aorta: Secondary | ICD-10-CM

## 2023-05-15 DIAGNOSIS — N183 Chronic kidney disease, stage 3 unspecified: Secondary | ICD-10-CM

## 2023-05-15 DIAGNOSIS — R7309 Other abnormal glucose: Secondary | ICD-10-CM | POA: Diagnosis not present

## 2023-05-15 DIAGNOSIS — I10 Essential (primary) hypertension: Secondary | ICD-10-CM | POA: Diagnosis not present

## 2023-05-15 DIAGNOSIS — Z0001 Encounter for general adult medical examination with abnormal findings: Secondary | ICD-10-CM

## 2023-05-15 DIAGNOSIS — E559 Vitamin D deficiency, unspecified: Secondary | ICD-10-CM | POA: Diagnosis not present

## 2023-05-15 DIAGNOSIS — Z87891 Personal history of nicotine dependence: Secondary | ICD-10-CM

## 2023-05-15 DIAGNOSIS — Z125 Encounter for screening for malignant neoplasm of prostate: Secondary | ICD-10-CM

## 2023-05-15 DIAGNOSIS — Z1389 Encounter for screening for other disorder: Secondary | ICD-10-CM

## 2023-05-15 DIAGNOSIS — E663 Overweight: Secondary | ICD-10-CM

## 2023-05-15 DIAGNOSIS — E782 Mixed hyperlipidemia: Secondary | ICD-10-CM

## 2023-05-15 DIAGNOSIS — J4489 Other specified chronic obstructive pulmonary disease: Secondary | ICD-10-CM

## 2023-05-15 MED ORDER — BISOPROLOL FUMARATE 5 MG PO TABS
ORAL_TABLET | ORAL | 2 refills | Status: DC
Start: 2023-05-15 — End: 2023-07-07

## 2023-05-15 NOTE — Patient Instructions (Addendum)
Decrease bisoprolol to 1/2 tab every day Follow up in 2 weeks   GENERAL HEALTH GOALS   Know what a healthy weight is for you (roughly BMI <25) and aim to maintain this   Aim for 7+ servings of fruits and vegetables daily   70-80+ fluid ounces of water or unsweet tea for healthy kidneys   Limit to max 1 drink of alcohol per day; avoid smoking/tobacco   Limit animal fats in diet for cholesterol and heart health - choose grass fed whenever available   Avoid highly processed foods, and foods high in saturated/trans fats   Aim for low stress - take time to unwind and care for your mental health   Aim for 150 min of moderate intensity exercise weekly for heart health, and weights twice weekly for bone health   Aim for 7-9 hours of sleep daily

## 2023-05-16 LAB — CBC WITH DIFFERENTIAL/PLATELET
Absolute Lymphocytes: 1439 {cells}/uL (ref 850–3900)
Absolute Monocytes: 719 {cells}/uL (ref 200–950)
Basophils Absolute: 40 {cells}/uL (ref 0–200)
Basophils Relative: 0.6 %
Eosinophils Absolute: 251 {cells}/uL (ref 15–500)
Eosinophils Relative: 3.8 %
HCT: 38.3 % — ABNORMAL LOW (ref 38.5–50.0)
Hemoglobin: 12.8 g/dL — ABNORMAL LOW (ref 13.2–17.1)
MCH: 30.5 pg (ref 27.0–33.0)
MCHC: 33.4 g/dL (ref 32.0–36.0)
MCV: 91.4 fL (ref 80.0–100.0)
MPV: 11.3 fL (ref 7.5–12.5)
Monocytes Relative: 10.9 %
Neutro Abs: 4151 {cells}/uL (ref 1500–7800)
Neutrophils Relative %: 62.9 %
Platelets: 243 10*3/uL (ref 140–400)
RBC: 4.19 10*6/uL — ABNORMAL LOW (ref 4.20–5.80)
RDW: 12.2 % (ref 11.0–15.0)
Total Lymphocyte: 21.8 %
WBC: 6.6 10*3/uL (ref 3.8–10.8)

## 2023-05-16 LAB — MICROALBUMIN / CREATININE URINE RATIO
Creatinine, Urine: 48 mg/dL (ref 20–320)
Microalb Creat Ratio: 23 mg/g{creat} (ref <30)
Microalb, Ur: 1.1 mg/dL

## 2023-05-16 LAB — COMPLETE METABOLIC PANEL WITH GFR
AG Ratio: 1.8 (calc) (ref 1.0–2.5)
ALT: 17 U/L (ref 9–46)
AST: 21 U/L (ref 10–35)
Albumin: 4.3 g/dL (ref 3.6–5.1)
Alkaline phosphatase (APISO): 99 U/L (ref 35–144)
BUN/Creatinine Ratio: 14 (calc) (ref 6–22)
BUN: 21 mg/dL (ref 7–25)
CO2: 31 mmol/L (ref 20–32)
Calcium: 9.9 mg/dL (ref 8.6–10.3)
Chloride: 103 mmol/L (ref 98–110)
Creat: 1.5 mg/dL — ABNORMAL HIGH (ref 0.70–1.28)
Globulin: 2.4 g/dL (ref 1.9–3.7)
Glucose, Bld: 92 mg/dL (ref 65–99)
Potassium: 4.8 mmol/L (ref 3.5–5.3)
Sodium: 140 mmol/L (ref 135–146)
Total Bilirubin: 0.7 mg/dL (ref 0.2–1.2)
Total Protein: 6.7 g/dL (ref 6.1–8.1)
eGFR: 49 mL/min/{1.73_m2} — ABNORMAL LOW (ref >=60)

## 2023-05-16 LAB — URINALYSIS, ROUTINE W REFLEX MICROSCOPIC
Bilirubin Urine: NEGATIVE
Glucose, UA: NEGATIVE
Hgb urine dipstick: NEGATIVE
Ketones, ur: NEGATIVE
Leukocytes,Ua: NEGATIVE
Nitrite: NEGATIVE
Protein, ur: NEGATIVE
Specific Gravity, Urine: 1.009 (ref 1.001–1.035)
pH: 7 (ref 5.0–8.0)

## 2023-05-16 LAB — LIPID PANEL
Cholesterol: 131 mg/dL (ref <200)
HDL: 60 mg/dL (ref >=40)
LDL Cholesterol (Calc): 57 mg/dL
Non-HDL Cholesterol (Calc): 71 mg/dL (ref <130)
Total CHOL/HDL Ratio: 2.2 (calc) (ref <5.0)
Triglycerides: 67 mg/dL (ref <150)

## 2023-05-16 LAB — PSA: PSA: 1 ng/mL (ref <=4.00)

## 2023-05-16 LAB — TSH: TSH: 0.73 m[IU]/L (ref 0.40–4.50)

## 2023-05-16 LAB — VITAMIN D 25 HYDROXY (VIT D DEFICIENCY, FRACTURES): Vit D, 25-Hydroxy: 67 ng/mL (ref 30–100)

## 2023-05-16 LAB — MAGNESIUM: Magnesium: 2.1 mg/dL (ref 1.5–2.5)

## 2023-05-16 LAB — HEMOGLOBIN A1C W/OUT EAG: Hgb A1c MFr Bld: 5.8 %{Hb} — ABNORMAL HIGH (ref <5.7)

## 2023-05-28 NOTE — Progress Notes (Signed)
Assessment and Plan:  [name] Park was seen today for follow-up.  Diagnoses and all orders for this visit:  Essential hypertension Stop bisoprolol- check BP daily and keep log and bring with you to follow up visit in 3 weeks - continue DASH diet, exercise and monitor at home. Call if greater than 130/80.    COPD (chronic obstructive pulmonary disease) with chronic bronchitis (HCC) Continue medications- Trelegy daily Symptoms currently controlled- continue to monitor    Further disposition pending results of labs. Discussed med's effects and SE's.   Over 30 minutes of exam, counseling, chart review, and critical decision making was performed.   Future Appointments  Date Time Provider Department Center  08/19/2023  4:20 PM Croitoru, Rachelle Hora, MD [facility] None  08/24/2023 11:30 AM Raynelle Dick, NP [facility] None  11/23/2023 11:30 AM Lucky Cowboy, MD [facility] None  05/18/2024 10:00 AM Raynelle Dick, NP [facility] None    ------------------------------------------------------------------------------------------------------------------   HPI BP 118/60   Pulse (!) 51   Temp 97.6 F (36.4 C)   Ht 5\' 9"  (1.753 m)   Wt 173 lb (78.5 kg)   SpO2 99%   BMI 25.55 kg/m  72 y.o.male presents for reevaluation of blood pressure  BP still running low with Bisoprolol 5 mg 1/2 tab every day and furosemide 20 mg every other day . Lowering the BP did not help the fatigue. His pulse has no longer been running less than 50 BP Readings from Last 3 Encounters:  05/29/23 118/60  05/15/23 118/60  03/18/23 116/60  Denies headaches, chest pain, shortness of breath and dizziness   Pulse Readings from Last 3 Encounters:  05/29/23 (!) 51  05/15/23 61  03/18/23 (!) 58     BMI is Body mass index is 25.55 kg/m., he has been working on diet and exercise. Wt Readings from Last 3 Encounters:  05/29/23 173 lb (78.5 kg)  05/15/23 173 lb 12.8 oz (78.8 kg)  03/18/23 175 lb 9.6 oz (79.7 kg)      Past Medical History:  Diagnosis Date   Acute appendicitis 11/24/2016   COPD (chronic obstructive pulmonary disease) (HCC)    GERD (gastroesophageal reflux disease)    Hematuria    HLD (hyperlipidemia)    Hypertension    IBS (irritable bowel syndrome)    Pre-diabetes    Vitamin D deficiency      No Known Allergies  Current Outpatient Medications on File Prior to Visit  Medication Sig   aspirin EC 81 MG tablet Take 81 mg by mouth daily. Swallow whole.   atorvastatin (LIPITOR) 40 MG tablet TAKE 1 TABLET BY MOUTH DAILY FOR CHOLESTEROL   bisoprolol (ZEBETA) 5 MG tablet TAKE 1/2 TABLET BY MOUTH  DAILY FOR BLOOD PRESSURE   buPROPion (WELLBUTRIN XL) 150 MG 24 hr tablet TAKE 1 TABLET BY MOUTH DAILY FOR MOOD, FOCUS, AND CONCENTRATION   Cholecalciferol (VITAMIN D-3) 1000 UNITS CAPS Take 5,000 Units by mouth daily.    Fluticasone-Umeclidin-Vilant (TRELEGY ELLIPTA) 100-62.5-25 MCG/ACT AEPB Inhale 1 puff into the lungs daily.   furosemide (LASIX) 20 MG tablet Take 20 mg every other day   tamsulosin (FLOMAX) 0.4 MG CAPS capsule Take 1 tablet at Bedtime for Prostate   No current facility-administered medications on file prior to visit.    ROS: all negative except above.   Physical Exam:  BP 118/60   Pulse (!) 51   Temp 97.6 F (36.4 C)   Ht 5\' 9"  (1.753 m)   Wt 173 lb (78.5 kg)  SpO2 99%   BMI 25.55 kg/m   General Appearance: Well nourished, in no apparent distress. Eyes: PERRLA, EOMs, conjunctiva no swelling or erythema Sinuses: No Frontal/maxillary tenderness ENT/Mouth: Ext aud canals clear, TMs without erythema, bulging. No erythema, swelling, or exudate on post pharynx.  Hearing normal.  Neck: Supple, thyroid normal.  Respiratory: Respiratory effort normal, BS equal bilaterally without rales, rhonchi, wheezing or stridor.  Cardio: RRR with no MRGs. Brisk peripheral pulses without edema.  Abdomen: Soft, + BS.  Non tender, no guarding, rebound, hernias,  masses. Lymphatics: Non tender without lymphadenopathy.  Musculoskeletal: Full ROM, 5/5 strength, normal gait.  Skin: Warm, dry without rashes, lesions, ecchymosis.  Neuro: Cranial nerves intact. Normal muscle tone, no cerebellar symptoms. Sensation intact.  Psych: Awake and oriented X 3, normal affect, Insight and Judgment appropriate.     Raynelle Dick, NP 11:04 AM [name]

## 2023-05-29 ENCOUNTER — Ambulatory Visit (INDEPENDENT_AMBULATORY_CARE_PROVIDER_SITE_OTHER): Payer: Medicare Other | Admitting: Nurse Practitioner

## 2023-05-29 ENCOUNTER — Encounter: Payer: Self-pay | Admitting: Nurse Practitioner

## 2023-05-29 VITALS — BP 118/60 | HR 51 | Temp 97.6°F | Ht 69.0 in | Wt 173.0 lb

## 2023-05-29 DIAGNOSIS — I10 Essential (primary) hypertension: Secondary | ICD-10-CM

## 2023-05-29 DIAGNOSIS — J4489 Other specified chronic obstructive pulmonary disease: Secondary | ICD-10-CM | POA: Diagnosis not present

## 2023-05-29 NOTE — Patient Instructions (Signed)
Stop bisoprolol- check BP daily and keep log and bring with you to follow up visit in 3 weeks   Hypertension, Adult Hypertension is another name for high blood pressure. High blood pressure forces your heart to work harder to pump blood. This can cause problems over time. There are two numbers in a blood pressure reading. There is a top number (systolic) over a bottom number (diastolic). It is best to have a blood pressure that is below 120/80. What are the causes? The cause of this condition is not known. Some other conditions can lead to high blood pressure. What increases the risk? Some lifestyle factors can make you more likely to develop high blood pressure: Smoking. Not getting enough exercise or physical activity. Being overweight. Having too much fat, sugar, calories, or salt (sodium) in your diet. Drinking too much alcohol. Other risk factors include: Having any of these conditions: Heart disease. Diabetes. High cholesterol. Kidney disease. Obstructive sleep apnea. Having a family history of high blood pressure and high cholesterol. Age. The risk increases with age. Stress. What are the signs or symptoms? High blood pressure may not cause symptoms. Very high blood pressure (hypertensive crisis) may cause: Headache. Fast or uneven heartbeats (palpitations). Shortness of breath. Nosebleed. Vomiting or feeling like you may vomit (nauseous). Changes in how you see. Very bad chest pain. Feeling dizzy. Seizures. How is this treated? This condition is treated by making healthy lifestyle changes, such as: Eating healthy foods. Exercising more. Drinking less alcohol. Your doctor may prescribe medicine if lifestyle changes do not help enough and if: Your top number is above 130. Your bottom number is above 80. Your personal target blood pressure may vary. Follow these instructions at home: Eating and drinking  If told, follow the DASH eating plan. To follow this  plan: Fill one half of your plate at each meal with fruits and vegetables. Fill one fourth of your plate at each meal with whole grains. Whole grains include whole-wheat pasta, brown rice, and whole-grain bread. Eat or drink low-fat dairy products, such as skim milk or low-fat yogurt. Fill one fourth of your plate at each meal with low-fat (lean) proteins. Low-fat proteins include fish, chicken without skin, eggs, beans, and tofu. Avoid fatty meat, cured and processed meat, or chicken with skin. Avoid pre-made or processed food. Limit the amount of salt in your diet to less than 1,500 mg each day. Do not drink alcohol if: Your doctor tells you not to drink. You are pregnant, may be pregnant, or are planning to become pregnant. If you drink alcohol: Limit how much you have to: 0-1 drink a day for women. 0-2 drinks a day for men. Know how much alcohol is in your drink. In the [place] one drink equals one 12 oz bottle of beer (355 mL), one 5 oz glass of wine (148 mL), or one 1 oz glass of hard liquor (44 mL). Lifestyle  Work with your doctor to stay at a healthy weight or to lose weight. Ask your doctor what the best weight is for you. Get at least 30 minutes of exercise that causes your heart to beat faster (aerobic exercise) most days of the week. This may include walking, swimming, or biking. Get at least 30 minutes of exercise that strengthens your muscles (resistance exercise) at least 3 days a week. This may include lifting weights or doing Pilates. Do not smoke or use any products that contain nicotine or tobacco. If you need help quitting, ask your doctor.  Check your blood pressure at home as told by your doctor. Keep all follow-up visits. Medicines Take over-the-counter and prescription medicines only as told by your doctor. Follow directions carefully. Do not skip doses of blood pressure medicine. The medicine does not work as well if you skip doses. Skipping doses also puts you at  risk for problems. Ask your doctor about side effects or reactions to medicines that you should watch for. Contact a doctor if: You think you are having a reaction to the medicine you are taking. You have headaches that keep coming back. You feel dizzy. You have swelling in your ankles. You have trouble with your vision. Get help right away if: You get a very bad headache. You start to feel mixed up (confused). You feel weak or numb. You feel faint. You have very bad pain in your: Chest. Belly (abdomen). You vomit more than once. You have trouble breathing. These symptoms may be an emergency. Get help right away. Call 911. Do not wait to see if the symptoms will go away. Do not drive yourself to the hospital. Summary Hypertension is another name for high blood pressure. High blood pressure forces your heart to work harder to pump blood. For most people, a normal blood pressure is less than 120/80. Making healthy choices can help lower blood pressure. If your blood pressure does not get lower with healthy choices, you may need to take medicine. This information is not intended to replace advice given to you by your health care provider. Make sure you discuss any questions you have with your health care provider. Document Revised: 03/28/2021 Document Reviewed: 03/28/2021 Elsevier Patient Education  2024 [employer].

## 2023-06-18 NOTE — Progress Notes (Signed)
Assessment and Plan:  [name] Park was seen today for follow-up.  Diagnoses and all orders for this visit:  Essential hypertension Stop bisoprolol and start Losartan 25 mg every day and monitor BP if gets greater than 130/980 consistently notify the office. Continue Furosemide 20 mg every other day - continue DASH diet, exercise and monitor at home. Call if greater than 130/80.    COPD (chronic obstructive pulmonary disease) with chronic bronchitis (HCC) Continue medications- Trelegy daily Symptoms currently controlled- continue to monitor    Further disposition pending results of labs. Discussed med's effects and SE's.   Over 30 minutes of exam, counseling, chart review, and critical decision making was performed.   Future Appointments  Date Time Provider Department Center  06/19/2023 11:30 AM Raynelle Dick, NP [facility] None  08/19/2023  4:20 PM Croitoru, Rachelle Hora, MD [facility] None  08/24/2023 11:30 AM Raynelle Dick, NP [facility] None  11/23/2023 11:30 AM Lucky Cowboy, MD [facility] None  05/18/2024 10:00 AM Raynelle Dick, NP [facility] None    ------------------------------------------------------------------------------------------------------------------   HPI BP 130/68   Pulse (!) 59   Temp 97.9 F (36.6 C)   Ht 5\' 9"  (1.753 m)   Wt 175 lb 14.4 oz (79.8 kg)   SpO2 98%   BMI 25.98 kg/m  72 y.o.male presents for reevaluation of blood pressure  At last visit his bisoprolol was stopped due to low BP and to use furosemide 20 mg every other day and fatigue He was advised to monitor BP and is at office today for follow up. His BP at home has been running 120-130/60's then crept up to 150/70's so started Bisoprolol 5 mg 1/2 tab BP Readings from Last 3 Encounters:  06/19/23 130/68  05/29/23 118/60  05/15/23 118/60  Denies headaches, chest pain, shortness of breath and dizziness   Pulse Readings from Last 3 Encounters:  06/19/23 (!) 59  05/29/23 (!) 51   05/15/23 61     BMI is Body mass index is 25.98 kg/m., he has been working on diet and exercise. Wt Readings from Last 3 Encounters:  06/19/23 175 lb 14.4 oz (79.8 kg)  05/29/23 173 lb (78.5 kg)  05/15/23 173 lb 12.8 oz (78.8 kg)   His COPD is currently controlled on Trelegy daily.    Past Medical History:  Diagnosis Date   Acute appendicitis 11/24/2016   COPD (chronic obstructive pulmonary disease) (HCC)    GERD (gastroesophageal reflux disease)    Hematuria    HLD (hyperlipidemia)    Hypertension    IBS (irritable bowel syndrome)    Pre-diabetes    Vitamin D deficiency      No Known Allergies  Current Outpatient Medications on File Prior to Visit  Medication Sig   aspirin EC 81 MG tablet Take 81 mg by mouth daily. Swallow whole.   atorvastatin (LIPITOR) 40 MG tablet TAKE 1 TABLET BY MOUTH DAILY FOR CHOLESTEROL   bisoprolol (ZEBETA) 5 MG tablet TAKE 1/2 TABLET BY MOUTH  DAILY FOR BLOOD PRESSURE   buPROPion (WELLBUTRIN XL) 150 MG 24 hr tablet TAKE 1 TABLET BY MOUTH DAILY FOR MOOD, FOCUS, AND CONCENTRATION   Cholecalciferol (VITAMIN D-3) 1000 UNITS CAPS Take 5,000 Units by mouth daily.    Fluticasone-Umeclidin-Vilant (TRELEGY ELLIPTA) 100-62.5-25 MCG/ACT AEPB Inhale 1 puff into the lungs daily.   furosemide (LASIX) 20 MG tablet Take 20 mg every other day   tamsulosin (FLOMAX) 0.4 MG CAPS capsule Take 1 tablet at Bedtime for Prostate   No current facility-administered medications  on file prior to visit.    ROS: all negative except above.   Physical Exam:  BP 130/68   Pulse (!) 59   Temp 97.9 F (36.6 C)   Ht 5\' 9"  (1.753 m)   Wt 175 lb 14.4 oz (79.8 kg)   SpO2 98%   BMI 25.98 kg/m   General Appearance: Well nourished, in no apparent distress. Eyes: PERRLA, EOMs, conjunctiva no swelling or erythema Neck: Supple, thyroid normal.  Respiratory: Respiratory effort normal, BS equal bilaterally without rales, rhonchi, wheezing or stridor.  Cardio: RRR with no MRGs.  Brisk peripheral pulses without edema.  Abdomen: Soft, + BS.  Non tender, no guarding, rebound, hernias, masses. Lymphatics: Non tender without lymphadenopathy.  Musculoskeletal: Full ROM, 5/5 strength, normal gait.  Skin: Warm, dry without rashes, lesions, ecchymosis.  Neuro: Cranial nerves intact. Normal muscle tone, no cerebellar symptoms. Sensation intact.  Psych: Awake and oriented X 3, normal affect, Insight and Judgment appropriate.     Raynelle Dick, NP 11:25 AM [facility]

## 2023-06-19 ENCOUNTER — Encounter: Payer: Self-pay | Admitting: Nurse Practitioner

## 2023-06-19 ENCOUNTER — Ambulatory Visit (INDEPENDENT_AMBULATORY_CARE_PROVIDER_SITE_OTHER): Payer: Medicare Other | Admitting: Nurse Practitioner

## 2023-06-19 VITALS — BP 130/68 | HR 59 | Temp 97.9°F | Ht 69.0 in | Wt 175.9 lb

## 2023-06-19 DIAGNOSIS — I10 Essential (primary) hypertension: Secondary | ICD-10-CM

## 2023-06-19 DIAGNOSIS — J4489 Other specified chronic obstructive pulmonary disease: Secondary | ICD-10-CM

## 2023-06-19 MED ORDER — LOSARTAN POTASSIUM 25 MG PO TABS: 25.0000 mg | ORAL_TABLET | Freq: Every day | ORAL | 11 refills | Status: DC

## 2023-06-19 NOTE — Patient Instructions (Addendum)
Stop bisoprolol and start Losartan 25 mg every day  Monitor BP and if consistently greater than 130/80  notify the office  Continue Furosemide 20 mg every other day  Hypertension, Adult High blood pressure (hypertension) is when the force of blood pumping through the arteries is too strong. The arteries are the blood vessels that carry blood from the heart throughout the body. Hypertension forces the heart to work harder to pump blood and may cause arteries to become narrow or stiff. Untreated or uncontrolled hypertension can lead to a heart attack, heart failure, a stroke, kidney disease, and other problems. A blood pressure reading consists of a higher number over a lower number. Ideally, your blood pressure should be below 120/80. The first ("top") number is called the systolic pressure. It is a measure of the pressure in your arteries as your heart beats. The second ("bottom") number is called the diastolic pressure. It is a measure of the pressure in your arteries as the heart relaxes. What are the causes? The exact cause of this condition is not known. There are some conditions that result in high blood pressure. What increases the risk? Certain factors may make you more likely to develop high blood pressure. Some of these risk factors are under your control, including: Smoking. Not getting enough exercise or physical activity. Being overweight. Having too much fat, sugar, calories, or salt (sodium) in your diet. Drinking too much alcohol. Other risk factors include: Having a personal history of heart disease, diabetes, high cholesterol, or kidney disease. Stress. Having a family history of high blood pressure and high cholesterol. Having obstructive sleep apnea. Age. The risk increases with age. What are the signs or symptoms? High blood pressure may not cause symptoms. Very high blood pressure (hypertensive crisis) may cause: Headache. Fast or irregular heartbeats  (palpitations). Shortness of breath. Nosebleed. Nausea and vomiting. Vision changes. Severe chest pain, dizziness, and seizures. How is this diagnosed? This condition is diagnosed by measuring your blood pressure while you are seated, with your arm resting on a flat surface, your legs uncrossed, and your feet flat on the floor. The cuff of the blood pressure monitor will be placed directly against the skin of your upper arm at the level of your heart. Blood pressure should be measured at least twice using the same arm. Certain conditions can cause a difference in blood pressure between your right and left arms. If you have a high blood pressure reading during one visit or you have normal blood pressure with other risk factors, you may be asked to: Return on a different day to have your blood pressure checked again. Monitor your blood pressure at home for 1 week or longer. If you are diagnosed with hypertension, you may have other blood or imaging tests to help your health care provider understand your overall risk for other conditions. How is this treated? This condition is treated by making healthy lifestyle changes, such as eating healthy foods, exercising more, and reducing your alcohol intake. You may be referred for counseling on a healthy diet and physical activity. Your health care provider may prescribe medicine if lifestyle changes are not enough to get your blood pressure under control and if: Your systolic blood pressure is above 130. Your diastolic blood pressure is above 80. Your personal target blood pressure may vary depending on your medical conditions, your age, and other factors. Follow these instructions at home: Eating and drinking  Eat a diet that is high in fiber and potassium, and  low in sodium, added sugar, and fat. An example of this eating plan is called the DASH diet. DASH stands for Dietary Approaches to Stop Hypertension. To eat this way: Eat plenty of fresh fruits  and vegetables. Try to fill one half of your plate at each meal with fruits and vegetables. Eat whole grains, such as whole-wheat pasta, brown rice, or whole-grain bread. Fill about one fourth of your plate with whole grains. Eat or drink low-fat dairy products, such as skim milk or low-fat yogurt. Avoid fatty cuts of meat, processed or cured meats, and poultry with skin. Fill about one fourth of your plate with lean proteins, such as fish, chicken without skin, beans, eggs, or tofu. Avoid pre-made and processed foods. These tend to be higher in sodium, added sugar, and fat. Reduce your daily sodium intake. Many people with hypertension should eat less than 1,500 mg of sodium a day. Do not drink alcohol if: Your health care provider tells you not to drink. You are pregnant, may be pregnant, or are planning to become pregnant. If you drink alcohol: Limit how much you have to: 0-1 drink a day for women. 0-2 drinks a day for men. Know how much alcohol is in your drink. In the [place] one drink equals one 12 oz bottle of beer (355 mL), one 5 oz glass of wine (148 mL), or one 1 oz glass of hard liquor (44 mL). Lifestyle  Work with your health care provider to maintain a healthy body weight or to lose weight. Ask what an ideal weight is for you. Get at least 30 minutes of exercise that causes your heart to beat faster (aerobic exercise) most days of the week. Activities may include walking, swimming, or biking. Include exercise to strengthen your muscles (resistance exercise), such as Pilates or lifting weights, as part of your weekly exercise routine. Try to do these types of exercises for 30 minutes at least 3 days a week. Do not use any products that contain nicotine or tobacco. These products include cigarettes, chewing tobacco, and vaping devices, such as e-cigarettes. If you need help quitting, ask your health care provider. Monitor your blood pressure at home as told by your health care  provider. Keep all follow-up visits. This is important. Medicines Take over-the-counter and prescription medicines only as told by your health care provider. Follow directions carefully. Blood pressure medicines must be taken as prescribed. Do not skip doses of blood pressure medicine. Doing this puts you at risk for problems and can make the medicine less effective. Ask your health care provider about side effects or reactions to medicines that you should watch for. Contact a health care provider if you: Think you are having a reaction to a medicine you are taking. Have headaches that keep coming back (recurring). Feel dizzy. Have swelling in your ankles. Have trouble with your vision. Get help right away if you: Develop a severe headache or confusion. Have unusual weakness or numbness. Feel faint. Have severe pain in your chest or abdomen. Vomit repeatedly. Have trouble breathing. These symptoms may be an emergency. Get help right away. Call 911. Do not wait to see if the symptoms will go away. Do not drive yourself to the hospital. Summary Hypertension is when the force of blood pumping through your arteries is too strong. If this condition is not controlled, it may put you at risk for serious complications. Your personal target blood pressure may vary depending on your medical conditions, your age, and other factors.  For most people, a normal blood pressure is less than 120/80. Hypertension is treated with lifestyle changes, medicines, or a combination of both. Lifestyle changes include losing weight, eating a healthy, low-sodium diet, exercising more, and limiting alcohol. This information is not intended to replace advice given to you by your health care provider. Make sure you discuss any questions you have with your health care provider. Document Revised: 04/16/2021 Document Reviewed: 04/16/2021 Elsevier Patient Education  2024 [employer].

## 2023-06-23 ENCOUNTER — Telehealth: Payer: Self-pay | Admitting: Nurse Practitioner

## 2023-06-23 NOTE — Telephone Encounter (Signed)
Spoke with patient, he understands to increase his dose and keep a log to bring with him in 2 weeks to his appointment.

## 2023-06-23 NOTE — Telephone Encounter (Signed)
Patient said you told him to call our office if his blood pressure is high/low. He just checked it... Blood pressure- 145/74 Pulse- 96 Heart rate- 125 You started him on lostartan on 12/27. What are your recommendations?

## 2023-06-23 NOTE — Telephone Encounter (Signed)
Double losartan dose( 2 tabs of 25 mg to make 50 mg) and continue to monitor BP. Schedule appointment in 2 weeks to recheck

## 2023-07-06 NOTE — Progress Notes (Signed)
 Assessment and Plan:  [name] Park was seen today for follow-up.  Diagnoses and all orders for this visit:  Essential hypertension - continue medications: Losartan  50 mg every day and furosemide  20 mg every other day - DASH diet, exercise and monitor at home. Call if greater than 130/80.   -     losartan  (COZAAR ) 50 MG tablet; Take 1 tablet (50 mg total) by mouth daily.  Overweight (BMI 25.0-29.9) Recommended diet heavy in fruits and veggies and low in animal meats, cheeses, and dairy products, appropriate calorie intake Patient will work on decreasing saturated fats, simple carbs. Increase activity    Further disposition pending results of labs. Discussed med's effects and SE's.   Over 30 minutes of exam, counseling, chart review, and critical decision making was performed.   Future Appointments  Date Time Provider Department Center  08/19/2023  4:20 PM Croitoru, Jerel, MD [facility] None  08/24/2023 11:30 AM Jude Lonell BRAVO, NP [facility] None  11/23/2023 11:30 AM Tonita Fallow, MD [facility] None  05/18/2024 10:00 AM Jude Lonell BRAVO, NP [facility] None    ------------------------------------------------------------------------------------------------------------------   HPI BP 114/60   Pulse 77   Temp 97.7 F (36.5 C)   Ht 5' 9 (1.753 m)   Wt 174 lb 9.6 oz (79.2 kg)   SpO2 97%   BMI 25.78 kg/m   73 y.o.male presents for reevaluation of blood pressure. At last visit 2 weeks ago his bisoprolol  was stopped and was started on Losartan  25 mg every day. His BP started to run in 140-150/70's and was instructed to take 2 Losartan  25 mg tabs a day.  He continues Furosemide  20 mg every other day. Since then BP at home running 120-130/70's.  He has had a little more energy since stopping bisoprolol . He has had no more low pulse readings since starting Losartan  and stopping bisoprolol .  BP well controlled with currently BP Readings from Last 3 Encounters:  07/07/23 114/60  06/19/23  130/68  05/29/23 118/60  Denies headaches, chest pain, shortness of breath and dizziness   Pulse Readings from Last 3 Encounters:  07/07/23 77  06/19/23 (!) 59  05/29/23 (!) 51    BMI is Body mass index is 25.78 kg/m., he has been working on diet and exercise. Wt Readings from Last 3 Encounters:  07/07/23 174 lb 9.6 oz (79.2 kg)  06/19/23 175 lb 14.4 oz (79.8 kg)  05/29/23 173 lb (78.5 kg)     Past Medical History:  Diagnosis Date   Acute appendicitis 11/24/2016   COPD (chronic obstructive pulmonary disease) (HCC)    GERD (gastroesophageal reflux disease)    Hematuria    HLD (hyperlipidemia)    Hypertension    IBS (irritable bowel syndrome)    Pre-diabetes    Vitamin D  deficiency      No Known Allergies  Current Outpatient Medications on File Prior to Visit  Medication Sig   aspirin  EC 81 MG tablet Take 81 mg by mouth daily. Swallow whole.   atorvastatin  (LIPITOR) 40 MG tablet TAKE 1 TABLET BY MOUTH DAILY FOR CHOLESTEROL   buPROPion  (WELLBUTRIN  XL) 150 MG 24 hr tablet TAKE 1 TABLET BY MOUTH DAILY FOR MOOD, FOCUS, AND CONCENTRATION   Cholecalciferol  (VITAMIN D -3) 1000 UNITS CAPS Take 5,000 Units by mouth daily.    Fluticasone -Umeclidin-Vilant (TRELEGY ELLIPTA ) 100-62.5-25 MCG/ACT AEPB Inhale 1 puff into the lungs daily.   furosemide  (LASIX ) 20 MG tablet Take 20 mg every other day   losartan  (COZAAR ) 25 MG tablet Take 1 tablet (25  mg total) by mouth daily.   oxybutynin  (DITROPAN  XL) 15 MG 24 hr tablet Take 15 mg by mouth daily.   tamsulosin  (FLOMAX ) 0.4 MG CAPS capsule Take 1 tablet at Bedtime for Prostate   bisoprolol  (ZEBETA ) 5 MG tablet TAKE 1/2 TABLET BY MOUTH  DAILY FOR BLOOD PRESSURE (Patient not taking: Reported on 07/07/2023)   No current facility-administered medications on file prior to visit.    ROS: all negative except above.   Physical Exam:  BP 114/60   Pulse 77   Temp 97.7 F (36.5 C)   Ht 5' 9 (1.753 m)   Wt 174 lb 9.6 oz (79.2 kg)   SpO2 97%    BMI 25.78 kg/m   General Appearance: Well nourished, in no apparent distress. Eyes: PERRLA, EOMs, conjunctiva no swelling or erythema Sinuses: No Frontal/maxillary tenderness ENT/Mouth: Ext aud canals clear, TMs without erythema, bulging. No erythema, swelling, or exudate on post pharynx.  Tonsils not swollen or erythematous. Hearing normal.  Neck: Supple, thyroid  normal.  Respiratory: Respiratory effort normal, BS equal bilaterally without rales, rhonchi, wheezing or stridor.  Cardio: RRR with no MRGs. Brisk peripheral pulses without edema.  Abdomen: Soft, + BS.  Non tender, no guarding, rebound, hernias, masses. Lymphatics: Non tender without lymphadenopathy.  Musculoskeletal: Full ROM, 5/5 strength, normal gait.  Skin: Warm, dry without rashes, lesions, ecchymosis.  Neuro: Cranial nerves intact. Normal muscle tone, no cerebellar symptoms. Sensation intact.  Psych: Awake and oriented X 3, normal affect, Insight and Judgment appropriate.     Tae Robak E Liv Rallis, NP 11:21 AM [facility]

## 2023-07-07 ENCOUNTER — Encounter: Payer: Self-pay | Admitting: Nurse Practitioner

## 2023-07-07 ENCOUNTER — Ambulatory Visit (INDEPENDENT_AMBULATORY_CARE_PROVIDER_SITE_OTHER): Payer: Medicare Other | Admitting: Nurse Practitioner

## 2023-07-07 VITALS — BP 114/60 | HR 77 | Temp 97.7°F | Ht 69.0 in | Wt 174.6 lb

## 2023-07-07 DIAGNOSIS — I10 Essential (primary) hypertension: Secondary | ICD-10-CM

## 2023-07-07 DIAGNOSIS — E663 Overweight: Secondary | ICD-10-CM | POA: Diagnosis not present

## 2023-07-07 MED ORDER — LOSARTAN POTASSIUM 50 MG PO TABS: 50.0000 mg | ORAL_TABLET | Freq: Every day | ORAL | 3 refills | Status: DC

## 2023-07-07 NOTE — Patient Instructions (Signed)
 Hypertension, Adult High blood pressure (hypertension) is when the force of blood pumping through the arteries is too strong. The arteries are the blood vessels that carry blood from the heart throughout the body. Hypertension forces the heart to work harder to pump blood and may cause arteries to become narrow or stiff. Untreated or uncontrolled hypertension can lead to a heart attack, heart failure, a stroke, kidney disease, and other problems. A blood pressure reading consists of a higher number over a lower number. Ideally, your blood pressure should be below 120/80. The first ("top") number is called the systolic pressure. It is a measure of the pressure in your arteries as your heart beats. The second ("bottom") number is called the diastolic pressure. It is a measure of the pressure in your arteries as the heart relaxes. What are the causes? The exact cause of this condition is not known. There are some conditions that result in high blood pressure. What increases the risk? Certain factors may make you more likely to develop high blood pressure. Some of these risk factors are under your control, including: Smoking. Not getting enough exercise or physical activity. Being overweight. Having too much fat, sugar, calories, or salt (sodium) in your diet. Drinking too much alcohol. Other risk factors include: Having a personal history of heart disease, diabetes, high cholesterol, or kidney disease. Stress. Having a family history of high blood pressure and high cholesterol. Having obstructive sleep apnea. Age. The risk increases with age. What are the signs or symptoms? High blood pressure may not cause symptoms. Very high blood pressure (hypertensive crisis) may cause: Headache. Fast or irregular heartbeats (palpitations). Shortness of breath. Nosebleed. Nausea and vomiting. Vision changes. Severe chest pain, dizziness, and seizures. How is this diagnosed? This condition is diagnosed by  measuring your blood pressure while you are seated, with your arm resting on a flat surface, your legs uncrossed, and your feet flat on the floor. The cuff of the blood pressure monitor will be placed directly against the skin of your upper arm at the level of your heart. Blood pressure should be measured at least twice using the same arm. Certain conditions can cause a difference in blood pressure between your right and left arms. If you have a high blood pressure reading during one visit or you have normal blood pressure with other risk factors, you may be asked to: Return on a different day to have your blood pressure checked again. Monitor your blood pressure at home for 1 week or longer. If you are diagnosed with hypertension, you may have other blood or imaging tests to help your health care provider understand your overall risk for other conditions. How is this treated? This condition is treated by making healthy lifestyle changes, such as eating healthy foods, exercising more, and reducing your alcohol intake. You may be referred for counseling on a healthy diet and physical activity. Your health care provider may prescribe medicine if lifestyle changes are not enough to get your blood pressure under control and if: Your systolic blood pressure is above 130. Your diastolic blood pressure is above 80. Your personal target blood pressure may vary depending on your medical conditions, your age, and other factors. Follow these instructions at home: Eating and drinking  Eat a diet that is high in fiber and potassium, and low in sodium, added sugar, and fat. An example of this eating plan is called the DASH diet. DASH stands for Dietary Approaches to Stop Hypertension. To eat this way: Eat  plenty of fresh fruits and vegetables. Try to fill one half of your plate at each meal with fruits and vegetables. Eat whole grains, such as whole-wheat pasta, brown rice, or whole-grain bread. Fill about one  fourth of your plate with whole grains. Eat or drink low-fat dairy products, such as skim milk or low-fat yogurt. Avoid fatty cuts of meat, processed or cured meats, and poultry with skin. Fill about one fourth of your plate with lean proteins, such as fish, chicken without skin, beans, eggs, or tofu. Avoid pre-made and processed foods. These tend to be higher in sodium, added sugar, and fat. Reduce your daily sodium intake. Many people with hypertension should eat less than 1,500 mg of sodium a day. Do not drink alcohol if: Your health care provider tells you not to drink. You are pregnant, may be pregnant, or are planning to become pregnant. If you drink alcohol: Limit how much you have to: 0-1 drink a day for women. 0-2 drinks a day for men. Know how much alcohol is in your drink. In the [place] one drink equals one 12 oz bottle of beer (355 mL), one 5 oz glass of wine (148 mL), or one 1 oz glass of hard liquor (44 mL). Lifestyle  Work with your health care provider to maintain a healthy body weight or to lose weight. Ask what an ideal weight is for you. Get at least 30 minutes of exercise that causes your heart to beat faster (aerobic exercise) most days of the week. Activities may include walking, swimming, or biking. Include exercise to strengthen your muscles (resistance exercise), such as Pilates or lifting weights, as part of your weekly exercise routine. Try to do these types of exercises for 30 minutes at least 3 days a week. Do not use any products that contain nicotine or tobacco. These products include cigarettes, chewing tobacco, and vaping devices, such as e-cigarettes. If you need help quitting, ask your health care provider. Monitor your blood pressure at home as told by your health care provider. Keep all follow-up visits. This is important. Medicines Take over-the-counter and prescription medicines only as told by your health care provider. Follow directions carefully. Blood  pressure medicines must be taken as prescribed. Do not skip doses of blood pressure medicine. Doing this puts you at risk for problems and can make the medicine less effective. Ask your health care provider about side effects or reactions to medicines that you should watch for. Contact a health care provider if you: Think you are having a reaction to a medicine you are taking. Have headaches that keep coming back (recurring). Feel dizzy. Have swelling in your ankles. Have trouble with your vision. Get help right away if you: Develop a severe headache or confusion. Have unusual weakness or numbness. Feel faint. Have severe pain in your chest or abdomen. Vomit repeatedly. Have trouble breathing. These symptoms may be an emergency. Get help right away. Call 911. Do not wait to see if the symptoms will go away. Do not drive yourself to the hospital. Summary Hypertension is when the force of blood pumping through your arteries is too strong. If this condition is not controlled, it may put you at risk for serious complications. Your personal target blood pressure may vary depending on your medical conditions, your age, and other factors. For most people, a normal blood pressure is less than 120/80. Hypertension is treated with lifestyle changes, medicines, or a combination of both. Lifestyle changes include losing weight, eating a healthy,  low-sodium diet, exercising more, and limiting alcohol. This information is not intended to replace advice given to you by your health care provider. Make sure you discuss any questions you have with your health care provider. Document Revised: 04/16/2021 Document Reviewed: 04/16/2021 Elsevier Patient Education  2024 [employer].

## 2023-08-05 ENCOUNTER — Telehealth: Payer: Self-pay | Admitting: Pulmonary Disease

## 2023-08-05 NOTE — Telephone Encounter (Signed)
Patient states needs refill for Trelegy. Pharmacy is [facility] order. Patient phone number is [phone].

## 2023-08-06 MED ORDER — TRELEGY ELLIPTA 100-62.5-25 MCG/ACT IN AEPB: 1.0000 | INHALATION_SPRAY | Freq: Every day | RESPIRATORY_TRACT | 4 refills | Status: DC

## 2023-08-06 NOTE — Telephone Encounter (Signed)
Patient checking on message for refill Trelegy. Patient phone number is [phone].

## 2023-08-06 NOTE — Telephone Encounter (Signed)
Trelegy refill sent. ATC X1. I informed pt via vm. nfn

## 2023-08-19 ENCOUNTER — Ambulatory Visit: Payer: Medicare Other | Attending: Cardiovascular Disease | Admitting: Cardiovascular Disease

## 2023-08-19 ENCOUNTER — Encounter: Payer: Self-pay | Admitting: Cardiovascular Disease

## 2023-08-19 VITALS — BP 118/68 | HR 83 | Ht 69.0 in | Wt 176.0 lb

## 2023-08-19 DIAGNOSIS — J4489 Other specified chronic obstructive pulmonary disease: Secondary | ICD-10-CM

## 2023-08-19 DIAGNOSIS — I2510 Atherosclerotic heart disease of native coronary artery without angina pectoris: Secondary | ICD-10-CM | POA: Diagnosis not present

## 2023-08-19 DIAGNOSIS — E7800 Pure hypercholesterolemia, unspecified: Secondary | ICD-10-CM | POA: Diagnosis not present

## 2023-08-19 DIAGNOSIS — N1831 Chronic kidney disease, stage 3a: Secondary | ICD-10-CM

## 2023-08-19 DIAGNOSIS — I10 Essential (primary) hypertension: Secondary | ICD-10-CM | POA: Diagnosis not present

## 2023-08-19 NOTE — Progress Notes (Signed)
 Cardiology Office Note:    Date:  08/23/2023   ID:  [name] Park, DOB [birth year], MRN [id number]  PCP:  [name] Cowboy, MD   [facility] Providers Cardiologist:  [name] Fair, MD     Referring MD: [name] Cowboy, MD   No chief complaint on file.   History of Present Illness:    [name] Park is a 73 y.o. male with a hx of hypertension, prediabetes, hypercholesterolemia, COPD, CKD stage III, history of right carpal tunnel release presents with complaints of dyspnea and edema.  His workup for a cardiac source of dyspnea has not shown any abnormalities.  He has normal left ventricular systolic and diastolic function and no significant valvular abnormalities.  Estimated pulmonary artery pressure was normal and the inferior vena cava was not dilated.  His BNP was quite low at 32.  He had some complaints of lower extremity discomfort with walking but he had normal lower extremity arterial Dopplers and normal bilateral ABI.  Coronary CT angiography performed 07/01/2022 did not show any significant obstructive lesions and showed an average coronary calcium score (270, 56th percentile).  On atorvastatin his lipid levels are excellent with an LDL of 57 and HDL of 60 on atorvastatin.  He has normal triglycerides.  He has borderline prediabetes mellitus with a hemoglobin A1c on 5.8%, not on any medications.  His blood pressure is well-controlled on a low-dose of losartan monotherapy.  His most recent creatinine was 1.5 in November.  He continues to take a low-dose of furosemide 20 mg every other day.  Subjectively, this has made him feel better.  He has had pneumonia "many times".  He has a history of smoking 1 to 2 packs a day for about 30 years, but quit more than 10 years ago.  He does not use inhalers.  His Problems with frequent urination and takes both Myrbetriq and Flomax.  He is on treatment with atorvastatin and recent LDL cholesterol was 72.  He bears a diagnosis of  prediabetes but his most recent hemoglobin A1c was normal at 5.6%.  His creatinine has been stable at around 1.5 on multiple assays performed this year, most recently a week ago.  He does not have a family history of early onset CAD.  His grandmother did die of congestive heart failure and advanced age.  Atherosclerotic calcification is noticed in the thoracic aorta on his chest x-ray.  Atherosclerotic calcifications are also seen in the aorta on a CT of the abdomen and pelvis performed in 2021 and atherosclerotic calcification is seen in the LAD artery on a chest CT performed for lung cancer screening in 2020.  His wife [name] Park is also my patient.  Past Medical History:  Diagnosis Date   Acute appendicitis 11/24/2016   COPD (chronic obstructive pulmonary disease) (HCC)    GERD (gastroesophageal reflux disease)    Hematuria    HLD (hyperlipidemia)    Hypertension    IBS (irritable bowel syndrome)    Pre-diabetes    Vitamin D deficiency     Past Surgical History:  Procedure Laterality Date   CARPAL TUNNEL RELEASE Right    CATARACT EXTRACTION, BILATERAL Bilateral    Dr. Randon Goldsmith   CERVICAL FUSION     HAND SURGERY Left    LAPAROSCOPIC APPENDECTOMY N/A 11/24/2016   Procedure: APPENDECTOMY LAPAROSCOPIC;  Surgeon: Berna Bue, MD;  Location: [facility] OR;  Service: General;  Laterality: N/A;   TRIGGER FINGER RELEASE Right    thumb    Current Medications:  Current Meds  Medication Sig   aspirin EC 81 MG tablet Take 81 mg by mouth daily. Swallow whole.   atorvastatin (LIPITOR) 40 MG tablet TAKE 1 TABLET BY MOUTH DAILY FOR CHOLESTEROL   buPROPion (WELLBUTRIN XL) 150 MG 24 hr tablet TAKE 1 TABLET BY MOUTH DAILY FOR MOOD, FOCUS, AND CONCENTRATION   Cholecalciferol (VITAMIN D-3) 1000 UNITS CAPS Take 5,000 Units by mouth daily.    Fluticasone-Umeclidin-Vilant (TRELEGY ELLIPTA) 100-62.5-25 MCG/ACT AEPB Inhale 1 puff into the lungs daily.   furosemide (LASIX) 20 MG tablet Take 20 mg every other day    losartan (COZAAR) 50 MG tablet Take 1 tablet (50 mg total) by mouth daily.   oxybutynin (DITROPAN XL) 15 MG 24 hr tablet Take 15 mg by mouth daily.   tamsulosin (FLOMAX) 0.4 MG CAPS capsule Take 1 tablet at Bedtime for Prostate     Allergies:   Patient has no known allergies.   Social History   Socioeconomic History   Marital status: Married    Spouse name: [name] Park   Number of children: 1   Years of education: 12   Highest education level: 12th grade  Occupational History   Occupation: retired  Tobacco Use   Smoking status: Former    Current packs/day: 0.00    Average packs/day: 1.5 packs/day for 35.0 years (52.5 ttl pk-yrs)    Types: Cigarettes    Start date: 02/04/1969    Quit date: 02/05/2004    Years since quitting: 19.5   Smokeless tobacco: Never  Vaping Use   Vaping status: Never Used  Substance and Sexual Activity   Alcohol use: Yes    Comment: seldom   Drug use: No   Sexual activity: Not on file  Other Topics Concern   Not on file  Social History Narrative   Not on file   Social Drivers of Health   Financial Resource Strain: Not on file  Food Insecurity: Not on file  Transportation Needs: Not on file  Physical Activity: Not on file  Stress: Not on file  Social Connections: Not on file     Family History: The patient's family history includes COPD in his mother; Cancer in his father; Diverticulosis in his mother; Hypertension in his mother; Irritable bowel syndrome in his mother; Kidney disease in his father; Lung cancer in his father; Lupus in his mother; Other in his father; Stroke in his father. There is no history of Colon cancer, Esophageal cancer, Rectal cancer, or Stomach cancer.  ROS:   Please see the history of present illness.     All other systems reviewed and are negative.  EKGs/Labs/Other Studies Reviewed:    The following studies were reviewed today:   EKG:  EKG is not ordered today.  Personally reviewed ECG tracing from 05/15/2023.   This showed sinus bradycardia 46 bpm but is otherwise normal.  Recent Labs: 05/15/2023: ALT 17; BUN 21; Creat 1.50; Hemoglobin 12.8; Magnesium 2.1; Platelets 243; Potassium 4.8; Sodium 140; TSH 0.73  Recent Lipid Panel    Component Value Date/Time   CHOL 131 05/15/2023 1030   TRIG 67 05/15/2023 1030   HDL 60 05/15/2023 1030   CHOLHDL 2.2 05/15/2023 1030   VLDL 24 08/11/2016 1129   LDLCALC 57 05/15/2023 1030     Risk Assessment/Calculations:       Physical Exam:    VS:  BP 118/68 (BP Location: Left Arm, Patient Position: Sitting, Cuff Size: Normal)   Pulse 83   Ht 5\' 9"  (1.753 m)  Wt 176 lb (79.8 kg)   SpO2 96%   BMI 25.99 kg/m     Wt Readings from Last 3 Encounters:  08/19/23 176 lb (79.8 kg)  07/07/23 174 lb 9.6 oz (79.2 kg)  06/19/23 175 lb 14.4 oz (79.8 kg)      General: Alert, oriented x3, no distress, minimally overweight Head: no evidence of trauma, PERRL, EOMI, no exophtalmos or lid lag, no myxedema, no xanthelasma; normal ears, nose and oropharynx Neck: normal jugular venous pulsations and no hepatojugular reflux; brisk carotid pulses without delay and no carotid bruits Chest: clear to auscultation, no signs of consolidation by percussion or palpation, normal fremitus, symmetrical and full respiratory excursions Cardiovascular: normal position and quality of the apical impulse, regular rhythm, normal first and second heart sounds, no murmurs, rubs or gallops Abdomen: no tenderness or distention, no masses by palpation, no abnormal pulsatility or arterial bruits, normal bowel sounds, no hepatosplenomegaly Extremities: no clubbing, cyanosis or edema; 2+ radial, ulnar and brachial pulses bilaterally; 2+ right femoral, posterior tibial and dorsalis pedis pulses; 2+ left femoral, posterior tibial and dorsalis pedis pulses; no subclavian or femoral bruits Neurological: grossly nonfocal Psych: Normal mood and affect    ASSESSMENT:    1. Coronary artery  calcification seen on CT scan   2. Hypercholesterolemia   3. Essential hypertension   4. COPD (chronic obstructive pulmonary disease) with chronic bronchitis (HCC)   5. Stage 3a chronic kidney disease (HCC)      PLAN:    In order of problems listed above:  Coronary/aortic atherosclerosis: Although he does have some visible coronary atherosclerosis on imaging studies this is pretty much average for age and gender.  He does not have any coronary stenoses on CT angiography performed in January 2024. HLP: All lipid parameters are at target on current medications.  Continue atorvastatin. HTN: Well-controlled on losartan.  Avoid beta-blockers due to reactive airway disease. COPD: Exertional dyspnea is not due to cardiac problems and is probably related to COPD.  Pulmonary function tests a year ago showed FVC 65% of predicted and FEV1 70% of predicted.  Trelegy has led to improvement in his symptoms. CKD3a: Creatinine has been stable around 1.5.  GFR is around 50.        Medication Adjustments/Labs and Tests Ordered: Current medicines are reviewed at length with the patient today.  Concerns regarding medicines are outlined above.  No orders of the defined types were placed in this encounter.  No orders of the defined types were placed in this encounter.   Patient Instructions  Medication Instructions:  Your physician recommends that you continue on your current medications as directed. Please refer to the Current Medication list given to you today.    *If you need a refill on your cardiac medications before your next appointment, please call your pharmacy*   Lab Work: NONE    If you have labs (blood work) drawn today and your tests are completely normal, you will receive your results only by: MyChart Message (if you have MyChart) OR A paper copy in the mail If you have any lab test that is abnormal or we need to change your treatment, we will call you to review the  results.   Testing/Procedures: NONE    Follow-Up: At [facility], you and your health needs are our priority.  As part of our continuing mission to provide you with exceptional heart care, we have created designated Provider Care Teams.  These Care Teams include your primary Cardiologist (physician)  and Advanced Practice Providers (APPs -  Physician Assistants and Nurse Practitioners) who all work together to provide you with the care you need, when you need it.  We recommend signing up for the patient portal called "MyChart".  Sign up information is provided on this After Visit Summary.  MyChart is used to connect with patients for Virtual Visits (Telemedicine).  Patients are able to view lab/test results, encounter notes, upcoming appointments, etc.  Non-urgent messages can be sent to your provider as well.   To learn more about what you can do with MyChart, go to [email or web address].    Your next appointment:   Follow up as needed    Provider:   Thurmon Fair, MD    Other Instructions    Signed, [name] Fair, MD  08/23/2023 7:02 PM    [facility]

## 2023-08-19 NOTE — Patient Instructions (Signed)
 Medication Instructions:  Your physician recommends that you continue on your current medications as directed. Please refer to the Current Medication list given to you today.    *If you need a refill on your cardiac medications before your next appointment, please call your pharmacy*   Lab Work: NONE    If you have labs (blood work) drawn today and your tests are completely normal, you will receive your results only by: MyChart Message (if you have MyChart) OR A paper copy in the mail If you have any lab test that is abnormal or we need to change your treatment, we will call you to review the results.   Testing/Procedures: NONE    Follow-Up: At [facility], you and your health needs are our priority.  As part of our continuing mission to provide you with exceptional heart care, we have created designated Provider Care Teams.  These Care Teams include your primary Cardiologist (physician) and Advanced Practice Providers (APPs -  Physician Assistants and Nurse Practitioners) who all work together to provide you with the care you need, when you need it.  We recommend signing up for the patient portal called "MyChart".  Sign up information is provided on this After Visit Summary.  MyChart is used to connect with patients for Virtual Visits (Telemedicine).  Patients are able to view lab/test results, encounter notes, upcoming appointments, etc.  Non-urgent messages can be sent to your provider as well.   To learn more about what you can do with MyChart, go to [email or web address].    Your next appointment:   Follow up as needed    Provider:   Thurmon Fair, MD    Other Instructions

## 2023-08-24 ENCOUNTER — Ambulatory Visit: Payer: Medicare Other | Admitting: Nurse Practitioner

## 2023-08-24 ENCOUNTER — Other Ambulatory Visit: Payer: Self-pay

## 2023-08-24 DIAGNOSIS — F325 Major depressive disorder, single episode, in full remission: Secondary | ICD-10-CM

## 2023-08-24 MED ORDER — BUPROPION HCL ER (XL) 150 MG PO TB24: ORAL_TABLET | ORAL | 0 refills | Status: DC

## 2023-08-30 ENCOUNTER — Other Ambulatory Visit: Payer: Self-pay | Admitting: Cardiovascular Disease

## 2023-09-16 ENCOUNTER — Encounter (HOSPITAL_BASED_OUTPATIENT_CLINIC_OR_DEPARTMENT_OTHER): Payer: Self-pay | Admitting: Family Medicine

## 2023-09-16 ENCOUNTER — Ambulatory Visit (INDEPENDENT_AMBULATORY_CARE_PROVIDER_SITE_OTHER): Payer: Medicare Other | Admitting: Family Medicine

## 2023-09-16 VITALS — BP 131/71 | HR 74 | Ht 69.0 in | Wt 175.3 lb

## 2023-09-16 DIAGNOSIS — J4489 Other specified chronic obstructive pulmonary disease: Secondary | ICD-10-CM

## 2023-09-16 DIAGNOSIS — Z716 Tobacco abuse counseling: Secondary | ICD-10-CM

## 2023-09-16 DIAGNOSIS — N189 Chronic kidney disease, unspecified: Secondary | ICD-10-CM

## 2023-09-16 DIAGNOSIS — R7303 Prediabetes: Secondary | ICD-10-CM

## 2023-09-16 DIAGNOSIS — I10 Essential (primary) hypertension: Secondary | ICD-10-CM | POA: Diagnosis not present

## 2023-09-16 DIAGNOSIS — N1831 Chronic kidney disease, stage 3a: Secondary | ICD-10-CM

## 2023-09-16 DIAGNOSIS — D631 Anemia in chronic kidney disease: Secondary | ICD-10-CM | POA: Insufficient documentation

## 2023-09-16 MED ORDER — BUPROPION HCL ER (XL) 150 MG PO TB24: ORAL_TABLET | ORAL | 3 refills | Status: DC

## 2023-09-16 NOTE — Patient Instructions (Signed)
 Please get your Shingrix vaccine in local pharmacy.

## 2023-09-16 NOTE — Progress Notes (Signed)
 New Patient Office Visit  Subjective:   [name] Park 04-05-1951 09/16/2023  Chief Complaint  Patient presents with   Establish Care    Establish care    HPI: [name] Park is a 73 year old male with history of hypertension, prediabetes, hyperlipidemia, COPD, history of tobacco use, CKD who presents today to establish care at [facility] at [facility]. Introduced to Publishing rights manager role and practice setting.  All questions answered.   Last PCP: Anda Kraft, NP Last annual physical: 05/14/2024 Concerns: See below    HYPERTENSION: [name] Park presents for the medical management of hypertension.  Patient's current hypertension medication regimen is: Losartan 50mg  every day, and Furosemide 20mg  every other day.  Patient reports medications recently adjusted by previous PCP in the past 6 months and has been doing well with current regimen. Patient is  currently taking prescribed medications for HTN.  Patient is  regularly keeping a check on BP at home. Average 120/70's Adhering to low sodium diet: Yes Exercising Regularly: No Denies headache, dizziness, CP, SHOB, vision changes.   BP Readings from Last 3 Encounters:  09/16/23 131/71  08/19/23 118/68  07/07/23 114/60    COPD: [name] Park presents for the medical management of COPD.  Current medications: Trellegy  Oxygen use: no Rescue inhaler frequency: None   Worsening cough or dyspnea: No  COPD status: controlled; seeing Pulmonary for maintenance   Pneumovax: Up to Date Influenza: Up to Date COVID-19: Not up to Date   CHRONIC KIDNEY DISEASE: [name] Park presents for the medical management of Chronic Kidney Disease.  Patient is  adhering to renal diet.  Patient is  on ACE1/ARB therapy.  Patient is  avoiding NSAIDS.     Does not attend dialysis and does not perform peritoneal dialysis.   Lab Results  Component Value Date   NA 140 05/15/2023   K 4.8  05/15/2023   CO2 31 05/15/2023   GLUCOSE 92 05/15/2023   BUN 21 05/15/2023   CREATININE 1.50 (H) 05/15/2023   CALCIUM 9.9 05/15/2023   GFR 46.18 (L) 12/12/2019   EGFR 49 (L) 05/15/2023   GFRNONAA 44 (L) 11/28/2020    IMPAIRED FASTING GLUCOSE [name] Park is here for medical management of impaired fasting glucose.  Patient's current IFG medication regimen is: Diet,Exercise Adhering to a diabetic diet: Yes Exercising Regularly: No Checking Blood Sugars: No Denies polydipsia, polyphagia, polyuria.  Lab Results  Component Value Date   HGBA1C 5.8 (H) 05/15/2023     The following portions of the patient's history were reviewed and updated as appropriate: past medical history, past surgical history, family history, social history, allergies, medications, and problem list.   Patient Active Problem List   Diagnosis Date Noted   Anemia due to chronic kidney disease 09/16/2023   Urinary tract infection without hematuria    Medication management 10/06/2018   Abnormal glucose 10/06/2018   Pulmonary nodules 07/02/2018   Prediabetes 04/01/2018   Chronic kidney disease, stage 3 (HCC) 06/11/2017   Overweight (BMI 25.0-29.9) 06/10/2017   Former smoker (54 pack year) 06/10/2017   Thoracic aorta atherosclerosis (HCC) by Chest CT scan in 2017 08/11/2016   COPD (chronic obstructive pulmonary disease) with chronic bronchitis (HCC) 09/07/2014   Generalized anxiety disorder 09/07/2014   Hyperlipidemia, mixed 06/08/2014   Gastroesophageal reflux disease 03/08/2014   OAB (overactive bladder) 03/08/2014   Diverticulosis 03/08/2014   Essential hypertension 12/05/2013   Vitamin D deficiency 06/21/2013   Past  Medical History:  Diagnosis Date   Acute appendicitis 11/24/2016   COPD (chronic obstructive pulmonary disease) (HCC)    GERD (gastroesophageal reflux disease)    Hematuria    HLD (hyperlipidemia)    Hypertension    IBS (irritable bowel syndrome)    Pre-diabetes    Vitamin D deficiency     Past Surgical History:  Procedure Laterality Date   CARPAL TUNNEL RELEASE Right    CATARACT EXTRACTION, BILATERAL Bilateral    Dr. Randon Goldsmith   CERVICAL FUSION     HAND SURGERY Left    LAPAROSCOPIC APPENDECTOMY N/A 11/24/2016   Procedure: APPENDECTOMY LAPAROSCOPIC;  Surgeon: Berna Bue, MD;  Location: [facility] OR;  Service: General;  Laterality: N/A;   TRIGGER FINGER RELEASE Right    thumb   Family History  Problem Relation Age of Onset   Irritable bowel syndrome Mother    Diverticulosis Mother    Lupus Mother    COPD Mother    Hypertension Mother    Lung cancer Father    Other Father        bladder polyps   Stroke Father    Kidney disease Father    Cancer Father    Colon cancer Neg Hx    Esophageal cancer Neg Hx    Rectal cancer Neg Hx    Stomach cancer Neg Hx    Social History   Socioeconomic History   Marital status: Married    Spouse name: Emanuelle Bastos   Number of children: 1   Years of education: 12   Highest education level: Some college, no degree  Occupational History   Occupation: retired  Tobacco Use   Smoking status: Former    Current packs/day: 0.00    Average packs/day: 1.5 packs/day for 35.0 years (52.5 ttl pk-yrs)    Types: Cigarettes    Start date: 02/04/1969    Quit date: 02/05/2004    Years since quitting: 19.6    Passive exposure: Past   Smokeless tobacco: Never  Vaping Use   Vaping status: Never Used  Substance and Sexual Activity   Alcohol use: Yes    Comment: seldom   Drug use: No   Sexual activity: Not on file  Other Topics Concern   Not on file  Social History Narrative   Not on file   Social Drivers of Health   Financial Resource Strain: Low Risk  (09/13/2023)   Overall Financial Resource Strain (CARDIA)    Difficulty of Paying Living Expenses: Not hard at all  Food Insecurity: No Food Insecurity (09/13/2023)   Hunger Vital Sign    Worried About Running Out of Food in the Last Year: Never true    Ran Out of Food in the Last  Year: Never true  Transportation Needs: No Transportation Needs (09/13/2023)   PRAPARE - Administrator, Civil Service (Medical): No    Lack of Transportation (Non-Medical): No  Physical Activity: Insufficiently Active (09/13/2023)   Exercise Vital Sign    Days of Exercise per Week: 2 days    Minutes of Exercise per Session: 40 min  Stress: No Stress Concern Present (09/13/2023)   [employer] of Occupational Health - Occupational Stress Questionnaire    Feeling of Stress : Only a little  Social Connections: Unknown (09/13/2023)   Social Connection and Isolation Panel [NHANES]    Frequency of Communication with Friends and Family: Once a week    Frequency of Social Gatherings with Friends and Family: Once a week  Attends Religious Services: Patient declined    Active Member of Clubs or Organizations: No    Attends Engineer, structural: Not on file    Marital Status: Married  Catering manager Violence: Not on file   Outpatient Medications Prior to Visit  Medication Sig Dispense Refill   aspirin EC 81 MG tablet Take 81 mg by mouth daily. Swallow whole.     atorvastatin (LIPITOR) 40 MG tablet TAKE 1 TABLET BY MOUTH DAILY FOR CHOLESTEROL 100 tablet 2   Cholecalciferol (VITAMIN D-3) 1000 UNITS CAPS Take 5,000 Units by mouth daily.      Fluticasone-Umeclidin-Vilant (TRELEGY ELLIPTA) 100-62.5-25 MCG/ACT AEPB Inhale 1 puff into the lungs daily. 60 each 4   furosemide (LASIX) 20 MG tablet TAKE 1 TABLET BY MOUTH EVERY  OTHER DAY 45 tablet 3   losartan (COZAAR) 50 MG tablet Take 1 tablet (50 mg total) by mouth daily. 90 tablet 3   oxybutynin (DITROPAN XL) 15 MG 24 hr tablet Take 15 mg by mouth daily.     tamsulosin (FLOMAX) 0.4 MG CAPS capsule Take 1 tablet at Bedtime for Prostate 90 capsule 3   buPROPion (WELLBUTRIN XL) 150 MG 24 hr tablet TAKE 1 TABLET BY MOUTH  DAILY FOR MOOD, FOCUS, AND  CONCENTRATION 90 tablet 0   No facility-administered medications prior to visit.    Not on File  ROS: A complete ROS was performed with pertinent positives/negatives noted in the HPI. The remainder of the ROS are negative.   Objective:   Today's Vitals   09/16/23 1305  BP: 131/71  Pulse: 74  SpO2: 99%  Weight: 175 lb 4.8 oz (79.5 kg)  Height: 5\' 9"  (1.753 m)  PainSc: 0-No pain    GENERAL: Well-appearing, in NAD. Well nourished.  SKIN: Pink, warm and dry.  Head: Normocephalic. NECK: Trachea midline. Full ROM w/o pain or tenderness. RESPIRATORY: Chest wall symmetrical. Respirations even and non-labored. MSK: Muscle tone and strength appropriate for age. EXTREMITIES: Without clubbing, cyanosis, or edema.  NEUROLOGIC: No motor or sensory deficits. Steady, even gait. C2-C12 intact.  PSYCH/MENTAL STATUS: Alert, oriented x 3. Cooperative, appropriate mood and affect.    Health Maintenance Due  Topic Date Due   Medicare Annual Wellness (AWV)  09/03/2023       Assessment & Plan:  1. Essential hypertension (Primary) Controlled.  Continue current regimen of losartan and furosemide as prescribed.  Reviewed heart healthy diet, regular monitoring of blood pressure.  2. COPD (chronic obstructive pulmonary disease) with chronic bronchitis (HCC) Controlled currently on Trelegy.  Will continue following by pulmonology by Dr. Francine Graven.   3. Stage 3a chronic kidney disease (HCC) Stable.  Reviewed NSAID avoidance, renal diet and clear hydration recommendations with patient.  Will check BMP with lab work today. - Basic metabolic panel  4. Anemia due to chronic kidney disease, unspecified CKD stage Stable per chart review.  Reviewed dietary recommendations with patient with CKD.  Will check CBC with lab work today - CBC with Differential/Platelet  5. Prediabetes Stable previously with diet and exercise.  Discussed dietary recommendations and will check A1c with lab work today. - Hemoglobin A1c  6. Encounter for smoking cessation counseling Patient using Wellbutrin  150 mg XL for smoking cessation and craving control.  Reports cessation from smoking currently and Wellbutrin working well for control of cravings.  Will continue regimen, feels sense.  - buPROPion (WELLBUTRIN XL) 150 MG 24 hr tablet; TAKE 1 TABLET BY MOUTH  DAILY FOR MOOD, FOCUS, AND  CONCENTRATION  Dispense: 90 tablet; Refill: 3   Patient to reach out to office if new, worrisome, or unresolved symptoms arise or if no improvement in patient's condition. Patient verbalized understanding and is agreeable to treatment plan. All questions answered to patient's satisfaction.    Return in about 3 months (around 12/17/2023) for Chronic Conditions.    Hilbert Bible, [place]

## 2023-09-17 ENCOUNTER — Encounter (HOSPITAL_BASED_OUTPATIENT_CLINIC_OR_DEPARTMENT_OTHER): Payer: Self-pay | Admitting: Family Medicine

## 2023-09-17 LAB — BASIC METABOLIC PANEL WITH GFR
BUN/Creatinine Ratio: 14 (ref 10–24)
BUN: 21 mg/dL (ref 8–27)
CO2: 23 mmol/L (ref 20–29)
Calcium: 10.2 mg/dL (ref 8.6–10.2)
Chloride: 102 mmol/L (ref 96–106)
Creatinine, Ser: 1.51 mg/dL — ABNORMAL HIGH (ref 0.76–1.27)
Glucose: 82 mg/dL (ref 70–99)
Potassium: 5.2 mmol/L (ref 3.5–5.2)
Sodium: 140 mmol/L (ref 134–144)
eGFR: 48 mL/min/{1.73_m2} — ABNORMAL LOW (ref >59)

## 2023-09-17 LAB — CBC WITH DIFFERENTIAL/PLATELET
Basophils Absolute: 0.1 10*3/uL (ref 0.0–0.2)
Basos: 1 %
EOS (ABSOLUTE): 0.3 10*3/uL (ref 0.0–0.4)
Eos: 4 %
Hematocrit: 40.9 % (ref 37.5–51.0)
Hemoglobin: 13.7 g/dL (ref 13.0–17.7)
Immature Grans (Abs): 0 10*3/uL (ref 0.0–0.1)
Immature Granulocytes: 0 %
Lymphocytes Absolute: 1.5 10*3/uL (ref 0.7–3.1)
Lymphs: 18 %
MCH: 30.6 pg (ref 26.6–33.0)
MCHC: 33.5 g/dL (ref 31.5–35.7)
MCV: 91 fL (ref 79–97)
Monocytes Absolute: 0.7 10*3/uL (ref 0.1–0.9)
Monocytes: 8 %
Neutrophils Absolute: 5.6 10*3/uL (ref 1.4–7.0)
Neutrophils: 69 %
Platelets: 292 10*3/uL (ref 150–450)
RBC: 4.48 x10E6/uL (ref 4.14–5.80)
RDW: 12.6 % (ref 11.6–15.4)
WBC: 8.1 10*3/uL (ref 3.4–10.8)

## 2023-09-17 LAB — HEMOGLOBIN A1C
Est. average glucose Bld gHb Est-mCnc: 114 mg/dL
Hgb A1c MFr Bld: 5.6 % (ref 4.8–5.6)

## 2023-09-17 NOTE — Progress Notes (Signed)
 Hi [name] Park,  Your kidney function is stable. Your chronic anemia (low blood counts) have improved and are at a normal limit. Your sugar and electrolytes were normal and your A1C has improved from 5.8 to 5.6 (which is out of the prediabetic range). Keep up the good work! We will continue to monitor your levels.

## 2023-11-12 ENCOUNTER — Ambulatory Visit: Payer: Medicare Other | Admitting: Nurse Practitioner

## 2023-11-23 ENCOUNTER — Ambulatory Visit: Payer: Medicare Other | Admitting: Internal Medicine

## 2023-11-30 ENCOUNTER — Ambulatory Visit (HOSPITAL_BASED_OUTPATIENT_CLINIC_OR_DEPARTMENT_OTHER): Admitting: *Deleted

## 2023-11-30 ENCOUNTER — Encounter (HOSPITAL_BASED_OUTPATIENT_CLINIC_OR_DEPARTMENT_OTHER): Payer: Self-pay | Admitting: *Deleted

## 2023-11-30 VITALS — Ht 69.0 in | Wt 178.0 lb

## 2023-11-30 DIAGNOSIS — Z0000 Encounter for general adult medical examination without abnormal findings: Secondary | ICD-10-CM

## 2023-11-30 NOTE — Patient Instructions (Signed)
  [name] , Thank you for taking time to come for your Medicare Wellness Visit. I appreciate your ongoing commitment to your health goals. Please review the following plan we discussed and let me know if I can assist you in the future.   These are the goals we discussed:  Goals      Exercise 150 min/wk Moderate Activity     Weight (lb) < 180 lb (81.6 kg)        This is a list of the screening recommended for you and due dates:  Health Maintenance  Topic Date Due   COVID-19 Vaccine (7 - 2024-25 season) 02/22/2023   Zoster (Shingles) Vaccine (1 of 2) 12/17/2023*   Flu Shot  01/22/2024   Medicare Annual Wellness Visit  11/29/2024   Colon Cancer Screening  02/27/2026   DTaP/Tdap/Td vaccine (3 - Td or Tdap) 08/21/2031   Pneumonia Vaccine  Completed   Hepatitis C Screening  Completed   HPV Vaccine  Aged Out   Meningitis B Vaccine  Aged Out  *Topic was postponed. The date shown is not the original due date.

## 2023-11-30 NOTE — Progress Notes (Signed)
 Subjective:   [name] Park is a 73 y.o. male who presents for Medicare Annual/Subsequent preventive examination.  Visit Complete: Virtual I connected with  Nainoa L Dragan on 11/30/23 by a audio enabled telemedicine application and verified that I am speaking with the correct person using two identifiers.  Patient Location: Home  Provider Location: Office/Clinic  I discussed the limitations of evaluation and management by telemedicine. The patient expressed understanding and agreed to proceed.  Vital Signs: Because this visit was a virtual/telehealth visit, some criteria may be missing or patient reported. Any vitals not documented were not able to be obtained and vitals that have been documented are patient reported.  Patient Medicare AWV questionnaire was completed by the patient on 11/30/23; I have confirmed that all information answered by patient is correct and no changes since this date.  Cardiac Risk Factors include: advanced age (>16men, >69 women);dyslipidemia;hypertension;male gender;smoking/ tobacco exposure     Objective:    Today's Vitals   11/30/23 1531  Weight: 178 lb (80.7 kg)  Height: 5\' 9"  (1.753 m)  PainSc: 4    Body mass index is 26.29 kg/m.     11/26/2022   10:39 AM 09/03/2022   11:30 AM 08/20/2021   11:28 AM 08/20/2020   10:59 AM 10/15/2019    9:30 AM 07/21/2019   10:51 AM 07/05/2018   10:54 AM  Advanced Directives  Does Patient Have a Medical Advance Directive? Yes Yes Yes Yes Yes Yes Yes  Type of Estate agent of [place];Living will Living will;Healthcare Power of [employer] Power of [place];Living will Healthcare Power of [place];Living will Healthcare Power of [employer] of [place];Living will Healthcare Power of [place];Living will  Does patient want to make changes to medical advance directive? No - Patient declined No - Patient declined No - Patient declined  No - Patient declined No - Patient  declined No - Patient declined  Copy of Healthcare Power of Attorney in Chart? No - copy requested No - copy requested No - copy requested Yes - validated most recent copy scanned in chart (See row information) No - copy requested No - copy requested No - copy requested    Current Medications (verified) Outpatient Encounter Medications as of 11/30/2023  Medication Sig   aspirin  EC 81 MG tablet Take 81 mg by mouth daily. Swallow whole.   atorvastatin  (LIPITOR) 40 MG tablet TAKE 1 TABLET BY MOUTH DAILY FOR CHOLESTEROL   buPROPion  (WELLBUTRIN  XL) 150 MG 24 hr tablet TAKE 1 TABLET BY MOUTH  DAILY FOR MOOD, FOCUS, AND  CONCENTRATION   Cholecalciferol  (VITAMIN D -3) 1000 UNITS CAPS Take 5,000 Units by mouth daily.    Fluticasone -Umeclidin-Vilant (TRELEGY ELLIPTA ) 100-62.5-25 MCG/ACT AEPB Inhale 1 puff into the lungs daily.   furosemide  (LASIX ) 20 MG tablet TAKE 1 TABLET BY MOUTH EVERY  OTHER DAY   losartan  (COZAAR ) 50 MG tablet Take 1 tablet (50 mg total) by mouth daily.   oxybutynin  (DITROPAN  XL) 15 MG 24 hr tablet Take 15 mg by mouth daily.   tamsulosin  (FLOMAX ) 0.4 MG CAPS capsule Take 1 tablet at Bedtime for Prostate   No facility-administered encounter medications on file as of 11/30/2023.    Allergies (verified) Patient has no known allergies.   History: Past Medical History:  Diagnosis Date   Acute appendicitis 11/24/2016   COPD (chronic obstructive pulmonary disease) (HCC)    GERD (gastroesophageal reflux disease)    Hematuria    HLD (hyperlipidemia)    Hypertension  IBS (irritable bowel syndrome)    Pre-diabetes    Vitamin D  deficiency    Past Surgical History:  Procedure Laterality Date   CARPAL TUNNEL RELEASE Right    CATARACT EXTRACTION, BILATERAL Bilateral    Dr. Terrall Ferraris   CERVICAL FUSION     HAND SURGERY Left    LAPAROSCOPIC APPENDECTOMY N/A 11/24/2016   Procedure: APPENDECTOMY LAPAROSCOPIC;  Surgeon: Adalberto Acton, MD;  Location: [facility] OR;  Service: General;  Laterality:  N/A;   TRIGGER FINGER RELEASE Right    thumb   Family History  Problem Relation Age of Onset   Irritable bowel syndrome Mother    Diverticulosis Mother    Lupus Mother    COPD Mother    Hypertension Mother    Lung cancer Father    Other Father        bladder polyps   Stroke Father    Kidney disease Father    Cancer Father    Colon cancer Neg Hx    Esophageal cancer Neg Hx    Rectal cancer Neg Hx    Stomach cancer Neg Hx    Social History   Socioeconomic History   Marital status: Married    Spouse name: Mickie Kozikowski   Number of children: 1   Years of education: 12   Highest education level: Some college, no degree  Occupational History   Occupation: retired  Tobacco Use   Smoking status: Former    Current packs/day: 0.00    Average packs/day: 1.5 packs/day for 35.0 years (52.5 ttl pk-yrs)    Types: Cigarettes    Start date: 02/04/1969    Quit date: 02/05/2004    Years since quitting: 19.8    Passive exposure: Past   Smokeless tobacco: Never  Vaping Use   Vaping status: Never Used  Substance and Sexual Activity   Alcohol use: Yes    Comment: seldom   Drug use: No   Sexual activity: Not on file  Other Topics Concern   Not on file  Social History Narrative   Not on file   Social Drivers of Health   Financial Resource Strain: Low Risk  (09/13/2023)   Overall Financial Resource Strain (CARDIA)    Difficulty of Paying Living Expenses: Not hard at all  Food Insecurity: No Food Insecurity (09/13/2023)   Hunger Vital Sign    Worried About Running Out of Food in the Last Year: Never true    Ran Out of Food in the Last Year: Never true  Transportation Needs: No Transportation Needs (09/13/2023)   PRAPARE - Administrator, Civil Service (Medical): No    Lack of Transportation (Non-Medical): No  Physical Activity: Insufficiently Active (09/13/2023)   Exercise Vital Sign    Days of Exercise per Week: 2 days    Minutes of Exercise per Session: 40 min   Stress: No Stress Concern Present (09/13/2023)   [employer] of Occupational Health - Occupational Stress Questionnaire    Feeling of Stress : Only a little  Social Connections: Socially Isolated (11/30/2023)   Social Connection and Isolation Panel [NHANES]    Frequency of Communication with Friends and Family: Once a week    Frequency of Social Gatherings with Friends and Family: Once a week    Attends Religious Services: Never    Database administrator or Organizations: No    Attends Banker Meetings: Never    Marital Status: Married    Tobacco Counseling Counseling given: Not Answered  Clinical Intake:  Pre-visit preparation completed: Yes  Pain : 0-10 Pain Score: 4  Pain Type: Acute pain Pain Location: Back Pain Orientation: Lower Pain Radiating Towards: does not radiate Pain Descriptors / Indicators: Tightness Pain Onset: Yesterday Pain Frequency: Intermittent Pain Relieving Factors: has been taking tylenol  which has helped Effect of Pain on Daily Activities: has not effected daily activities except made it harder for him to bend over to pick things up  Pain Relieving Factors: has been taking tylenol  which has helped  BMI - recorded: 26.29 Nutritional Status: BMI 25 -29 Overweight Nutritional Risks: None Diabetes: No  How often do you need to have someone help you when you read instructions, pamphlets, or other written materials from your doctor or pharmacy?: 1 - Never What is the last grade level you completed in school?: two years of community college  Interpreter Needed?: No  Information entered by :: Fredia Janus, CMA   Activities of Daily Living    11/30/2023    3:36 PM  In your present state of health, do you have any difficulty performing the following activities:  Hearing? 0  Vision? 0  Difficulty concentrating or making decisions? 0  Walking or climbing stairs? 0  Dressing or bathing? 0  Doing errands, shopping? 0  Preparing  Food and eating ? N  Using the Toilet? N  In the past six months, have you accidently leaked urine? Y  Do you have problems with loss of bowel control? N  Managing your Medications? N  Managing your Finances? N  Housekeeping or managing your Housekeeping? N    Patient Care Team: Caudle, Arcola Kocher, FNP as PCP - General (Family Medicine) Croitoru, Karyl Paget, MD as PCP - Cardiology (Cardiology) Alvina Axon, MD as Consulting Physician (Ophthalmology) Asencion Blacksmith, MD (Inactive) as Consulting Physician (Gastroenterology) Merryl Abraham, [facility] (Inactive) as Pharmacist (Pharmacist)  Indicate any recent Medical Services you may have received from other than [facility] providers in the past year (date may be approximate).     Assessment:   This is a routine wellness examination for [name] Park.  Hearing/Vision screen No results found.   Goals Addressed   None   Depression Screen    11/30/2023    3:37 PM 09/16/2023    1:09 PM 02/10/2023    3:22 PM 11/26/2022   11:03 AM 09/03/2022   11:30 AM 01/29/2022   10:07 PM 08/20/2021   11:29 AM  PHQ 2/9 Scores  PHQ - 2 Score 0 0 1 2 0 0 0  PHQ- 9 Score 0 0 2 4       Fall Risk    11/30/2023    3:37 PM 09/16/2023    1:09 PM 02/19/2023    1:36 PM 02/17/2023    1:31 PM 02/12/2023    1:27 PM  Fall Risk   Falls in the past year? 0 0 0 0 0  Number falls in past yr: 0 0 0 0 0  Injury with Fall? 0 0 0 0 0  Risk for fall due to : No Fall Risks No Fall Risks Impaired vision Impaired vision Impaired vision  Follow up Falls evaluation completed Falls evaluation completed Falls evaluation completed Falls evaluation completed Falls evaluation completed    MEDICARE RISK AT HOME: Medicare Risk at Home Any stairs in or around the home?: Yes If so, are there any without handrails?: No Home free of loose throw rugs in walkways, pet beds, electrical cords, etc?: Yes Adequate lighting in your home to reduce  risk of falls?: Yes Life alert?: No Use of a cane, walker or  w/c?: No Grab bars in the bathroom?: No Shower chair or bench in shower?: No Elevated toilet seat or a handicapped toilet?: Yes  Cognitive Function:        11/30/2023    3:38 PM 08/20/2020   11:37 AM  6CIT Screen  What Year? 0 points 0 points  What month? 0 points 0 points  What time? 0 points 0 points  Count back from 20 0 points 0 points  Months in reverse 0 points 0 points  Repeat phrase 2 points 0 points  Total Score 2 points 0 points    Immunizations Immunization History  Administered Date(s) Administered   Influenza Whole 04/07/2013   Influenza, High Dose Seasonal PF 04/13/2014, 03/27/2015, 03/25/2016, 04/08/2018, 04/14/2019, 05/10/2020, 05/14/2021, 05/14/2022, 03/31/2023   Influenza-Unspecified 03/31/2017   PFIZER Comirnaty(Gray Top)Covid-19 Tri-Sucrose Vaccine 11/12/2020, 05/28/2022   PFIZER(Purple Top)SARS-COV-2 Vaccination 07/15/2019, 08/04/2019, 03/28/2020   PNEUMOCOCCAL CONJUGATE-20 05/14/2021   PPD Test 12/05/2013   Pfizer Covid-19 Vaccine Bivalent Booster 66yrs & up 04/05/2021   Pneumococcal Conjugate-13 06/08/2014   Pneumococcal Polysaccharide-23 09/07/2014   Respiratory Syncytial Virus Vaccine,Recomb Aduvanted(Arexvy) 07/07/2023   Td 08/20/2021   Tdap 04/29/2011   Zoster, Live 06/12/2014    TDAP status: Up to date  Flu Vaccine status: Up to date  Pneumococcal vaccine status: Up to date  Covid-19 vaccine status: Completed vaccines  Qualifies for Shingles Vaccine? Yes   Shingrix Completed?: No.    Education has been provided regarding the importance of this vaccine. Patient has been advised to call insurance company to determine out of pocket expense if they have not yet received this vaccine. Advised may also receive vaccine at local pharmacy or Health Dept. Verbalized acceptance and understanding.  Screening Tests Health Maintenance  Topic Date Due   COVID-19 Vaccine (7 - 2024-25 season) 02/22/2023   Zoster Vaccines- Shingrix (1 of 2) 12/17/2023  (Originally 04/04/1970)   INFLUENZA VACCINE  01/22/2024   Medicare Annual Wellness (AWV)  11/29/2024   Colonoscopy  02/27/2026   DTaP/Tdap/Td (3 - Td or Tdap) 08/21/2031   Pneumonia Vaccine 72+ Years old  Completed   Hepatitis C Screening  Completed   HPV VACCINES  Aged Out   Meningococcal B Vaccine  Aged Out    Health Maintenance  Health Maintenance Due  Topic Date Due   COVID-19 Vaccine (7 - 2024-25 season) 02/22/2023    Colorectal cancer screening: No longer required.   Lung Cancer Screening: (Low Dose CT Chest recommended if Age 67-80 years, 20 pack-year currently smoking OR have quit w/in 15years.) does not qualify.    Additional Screening:  Hepatitis C Screening: does not qualify  Vision Screening: Recommended annual ophthalmology exams for early detection of glaucoma and other disorders of the eye. Is the patient up to date with their annual eye exam?  Yes  Who is the provider or what is the name of the office in which the patient attends annual eye exams? [facility] If pt is not established with a provider, would they like to be referred to a provider to establish care? No .   Dental Screening: Recommended annual dental exams for proper oral hygiene         Plan:     I have personally reviewed and noted the following in the patient's chart:   Medical and social history Use of alcohol, tobacco or illicit drugs  Current medications and supplements including opioid prescriptions.  Functional  ability and status Nutritional status Physical activity Advanced directives List of other physicians Hospitalizations, surgeries, and ER visits in previous 12 months Vitals Screenings to include cognitive, depression, and falls Referrals and appointments  In addition, I have reviewed and discussed with patient certain preventive protocols, quality metrics, and best practice recommendations. A written personalized care plan for preventive services as  well as general preventive health recommendations were provided to patient.     Breezie Micucci, Samson Croak, CMA   11/30/2023   After Visit Summary: (MyChart) Due to this being a telephonic visit, the after visit summary with patients personalized plan was offered to patient via MyChart

## 2023-12-03 ENCOUNTER — Ambulatory Visit (HOSPITAL_BASED_OUTPATIENT_CLINIC_OR_DEPARTMENT_OTHER)

## 2023-12-03 ENCOUNTER — Ambulatory Visit: Payer: Self-pay

## 2023-12-03 ENCOUNTER — Encounter (HOSPITAL_BASED_OUTPATIENT_CLINIC_OR_DEPARTMENT_OTHER): Payer: Self-pay | Admitting: Student

## 2023-12-03 ENCOUNTER — Other Ambulatory Visit (HOSPITAL_BASED_OUTPATIENT_CLINIC_OR_DEPARTMENT_OTHER): Payer: Self-pay

## 2023-12-03 ENCOUNTER — Ambulatory Visit (HOSPITAL_BASED_OUTPATIENT_CLINIC_OR_DEPARTMENT_OTHER): Admitting: Student

## 2023-12-03 DIAGNOSIS — M545 Low back pain, unspecified: Secondary | ICD-10-CM

## 2023-12-03 DIAGNOSIS — M549 Dorsalgia, unspecified: Secondary | ICD-10-CM | POA: Diagnosis not present

## 2023-12-03 DIAGNOSIS — M47816 Spondylosis without myelopathy or radiculopathy, lumbar region: Secondary | ICD-10-CM | POA: Diagnosis not present

## 2023-12-03 DIAGNOSIS — M438X6 Other specified deforming dorsopathies, lumbar region: Secondary | ICD-10-CM | POA: Diagnosis not present

## 2023-12-03 MED ORDER — METHOCARBAMOL 500 MG PO TABS
500.0000 mg | ORAL_TABLET | Freq: Four times a day (QID) | ORAL | 0 refills | Status: AC
  Filled 2023-12-03: qty 40, 10d supply, fill #0

## 2023-12-03 NOTE — Progress Notes (Signed)
 Chief Complaint: Low back pain     History of Present Illness:    [name] Park is a 73 y.o. male who presents to clinic today for evaluation of left-sided low back pain.  Patient initially noticed the pain about 4 days ago, the day after he had been throwing a ball around with his niece.  The pain had been improving, however has been notably worse as of today after lifting a door to put in the back of his truck.  No recent injury or falls.  Pain is located in the left side of his low back and does not radiate.  He denies any numbness or tingling.  Has used Tylenol  which did provide some relief.  He does have a longstanding history of low back pain episodes, which he states have been managed previously with muscle relaxers.   Surgical History:   None  PMH/PSH/Family History/Social History/Meds/Allergies:    Past Medical History:  Diagnosis Date   Acute appendicitis 11/24/2016   COPD (chronic obstructive pulmonary disease) (HCC)    GERD (gastroesophageal reflux disease)    Hematuria    HLD (hyperlipidemia)    Hypertension    IBS (irritable bowel syndrome)    Pre-diabetes    Vitamin D  deficiency    Past Surgical History:  Procedure Laterality Date   CARPAL TUNNEL RELEASE Right    CATARACT EXTRACTION, BILATERAL Bilateral    Dr. Terrall Ferraris   CERVICAL FUSION     HAND SURGERY Left    LAPAROSCOPIC APPENDECTOMY N/A 11/24/2016   Procedure: APPENDECTOMY LAPAROSCOPIC;  Surgeon: Adalberto Acton, MD;  Location: [facility] OR;  Service: General;  Laterality: N/A;   TRIGGER FINGER RELEASE Right    thumb   Social History   Socioeconomic History   Marital status: Married    Spouse name: Indalecio Malmstrom   Number of children: 1   Years of education: 12   Highest education level: Some college, no degree  Occupational History   Occupation: retired  Tobacco Use   Smoking status: Former    Current packs/day: 0.00    Average packs/day: 1.5 packs/day for 35.0 years  (52.5 ttl pk-yrs)    Types: Cigarettes    Start date: 02/04/1969    Quit date: 02/05/2004    Years since quitting: 19.8    Passive exposure: Past   Smokeless tobacco: Never  Vaping Use   Vaping status: Never Used  Substance and Sexual Activity   Alcohol use: Yes    Comment: seldom   Drug use: No   Sexual activity: Not on file  Other Topics Concern   Not on file  Social History Narrative   Not on file   Social Drivers of Health   Financial Resource Strain: Low Risk  (09/13/2023)   Overall Financial Resource Strain (CARDIA)    Difficulty of Paying Living Expenses: Not hard at all  Food Insecurity: No Food Insecurity (09/13/2023)   Hunger Vital Sign    Worried About Running Out of Food in the Last Year: Never true    Ran Out of Food in the Last Year: Never true  Transportation Needs: No Transportation Needs (09/13/2023)   PRAPARE - Administrator, Civil Service (Medical): No    Lack of Transportation (Non-Medical): No  Physical Activity: Insufficiently Active (09/13/2023)   Exercise Vital  Sign    Days of Exercise per Week: 2 days    Minutes of Exercise per Session: 40 min  Stress: No Stress Concern Present (09/13/2023)   [employer] of Occupational Health - Occupational Stress Questionnaire    Feeling of Stress : Only a little  Social Connections: Socially Isolated (11/30/2023)   Social Connection and Isolation Panel    Frequency of Communication with Friends and Family: Once a week    Frequency of Social Gatherings with Friends and Family: Once a week    Attends Religious Services: Never    Database administrator or Organizations: No    Attends Engineer, structural: Never    Marital Status: Married   Family History  Problem Relation Age of Onset   Irritable bowel syndrome Mother    Diverticulosis Mother    Lupus Mother    COPD Mother    Hypertension Mother    Lung cancer Father    Other Father        bladder polyps   Stroke Father    Kidney  disease Father    Cancer Father    Colon cancer Neg Hx    Esophageal cancer Neg Hx    Rectal cancer Neg Hx    Stomach cancer Neg Hx    No Known Allergies Current Outpatient Medications  Medication Sig Dispense Refill   methocarbamol  (ROBAXIN ) 500 MG tablet Take 1 tablet (500 mg total) by mouth 4 (four) times daily for 10 days. 40 tablet 0   aspirin  EC 81 MG tablet Take 81 mg by mouth daily. Swallow whole.     atorvastatin  (LIPITOR) 40 MG tablet TAKE 1 TABLET BY MOUTH DAILY FOR CHOLESTEROL 100 tablet 2   buPROPion  (WELLBUTRIN  XL) 150 MG 24 hr tablet TAKE 1 TABLET BY MOUTH  DAILY FOR MOOD, FOCUS, AND  CONCENTRATION 90 tablet 3   Cholecalciferol  (VITAMIN D -3) 1000 UNITS CAPS Take 5,000 Units by mouth daily.      Fluticasone -Umeclidin-Vilant (TRELEGY ELLIPTA ) 100-62.5-25 MCG/ACT AEPB Inhale 1 puff into the lungs daily. 60 each 4   furosemide  (LASIX ) 20 MG tablet TAKE 1 TABLET BY MOUTH EVERY  OTHER DAY 45 tablet 3   losartan  (COZAAR ) 50 MG tablet Take 1 tablet (50 mg total) by mouth daily. 90 tablet 3   oxybutynin  (DITROPAN  XL) 15 MG 24 hr tablet Take 15 mg by mouth daily.     tamsulosin  (FLOMAX ) 0.4 MG CAPS capsule Take 1 tablet at Bedtime for Prostate 90 capsule 3   No current facility-administered medications for this visit.   No results found.  Review of Systems:   A ROS was performed including pertinent positives and negatives as documented in the HPI.  Physical Exam :   Constitutional: NAD and appears stated age Neurological: Alert and oriented Psych: Appropriate affect and cooperative There were no vitals taken for this visit.   Comprehensive Musculoskeletal Exam:    No midline tenderness of the lumbar spine.  There is tenderness and a palpable knot in the left lumbar paraspinal musculature.  Pain and slightly decreased range of motion with lumbar flexion and rotation to the left.  No notable weakness.  Distal neurosensory exam is intact.  Imaging:   Xray (lumbar spine 4  views): Diffuse disc space narrowing with anterior osteophyte formation at all levels.  L5-S1 facet arthropathy.  Otherwise negative for acute bony abnormality.   I personally reviewed and interpreted the radiographs.   Assessment:   73 y.o. male with acute left-sided  low back pain which appears very consistent with a lumbar strain.  He does have history of these episodes which have typically responded well with use of medication.  Patient saw Dr. Christiane Cowing in 2020 with very similar symptoms.  No red flag symptoms or sciatica present today.  I will plan to begin him on Robaxin  500 mg to use as needed, and he can continue use of Tylenol .  Also recommend use of a heating pad.  Should symptoms fail to improve, could consider addition of oral steroid or referral to physical therapy.  Plan :    - Return to clinic as needed     I personally saw and evaluated the patient, and participated in the management and treatment plan.  Sharrell Deck, PA-C Orthopedics

## 2023-12-03 NOTE — Telephone Encounter (Signed)
 FYI Only or Action Required?: FYI only for provider  Patient was last seen in primary care on 09/16/2023 by [name] Bays, FNP. Called Nurse Triage reporting Back Pain. Symptoms began several days ago. Interventions attempted: OTC medications: Tylenol . Symptoms are: gradually worsening.  Triage Disposition: See PCP When Office is Open (Within 3 Days)  Patient/caregiver understands and will follow disposition?: Yes  Pt advised to go to [facility] [place] on 2nd floor. Pt states he was already heading there now. Advised would let PCP know, if further needs to call back.

## 2023-12-03 NOTE — Telephone Encounter (Signed)
Reason for Disposition  . [1] MODERATE back pain (e.g., interferes with normal activities) AND [2] present > 3 days    Protocols used: Back Pain-A-AH

## 2023-12-03 NOTE — Telephone Encounter (Addendum)
 Care advise and disposition are needed. Unable to reach pt after being disconnected.       Copied from CRM [id number]. Topic: Clinical - Red Word Triage >> Dec 03, 2023  9:30 AM Marissa P wrote: Red Word that prompted transfer to Nurse Triage: Patient called stating that he is having some back pain, lower pain. Did wellness call on Monday mentioned it to her and thought it would be better but it got worse. It is like a shooting pain especially when he stands up at times. Answer Assessment - Initial Assessment Questions 1. ONSET: When did the pain begin?      sunday 2. LOCATION: Where does it hurt? (upper, mid or lower back)     Lower left side of back  3. SEVERITY: How bad is the pain?  (e.g., Scale 1-10; mild, moderate, or severe)   - MILD (1-3): Doesn't interfere with normal activities.    - MODERATE (4-7): Interferes with normal activities or awakens from sleep.    - SEVERE (8-10): Excruciating pain, unable to do any normal activities.      6 4. PATTERN: Is the pain constant? (e.g., yes, no; constant, intermittent)      Comes and constant  5. RADIATION: Does the pain shoot into your legs or somewhere else?     denies 6. CAUSE:  What do you think is causing the back pain?      Not sure 7. BACK OVERUSE:  Any recent lifting of heavy objects, strenuous work or exercise?     na 8. MEDICINES: What have you taken so far for the pain? (e.g., nothing, acetaminophen , NSAIDS)     Tylenol   9. NEUROLOGIC SYMPTOMS: Do you have any weakness, numbness, or problems with bowel/bladder control?     denies 10. OTHER SYMPTOMS: Do you have any other symptoms? (e.g., fever, abdomen pain, burning with urination, blood in urine)       Denies   Pt stated this started Sunday. Pt states pain is worse when he first starts walking. He has starts walking being bent over but once going can slowly straighten out.  Protocols used: Back Pain-A-AH

## 2023-12-14 ENCOUNTER — Other Ambulatory Visit: Payer: Self-pay | Admitting: Pulmonary Disease

## 2023-12-23 ENCOUNTER — Encounter (HOSPITAL_BASED_OUTPATIENT_CLINIC_OR_DEPARTMENT_OTHER): Payer: Self-pay | Admitting: Family Medicine

## 2023-12-23 ENCOUNTER — Ambulatory Visit (INDEPENDENT_AMBULATORY_CARE_PROVIDER_SITE_OTHER): Admitting: Family Medicine

## 2023-12-23 VITALS — BP 126/61 | HR 79 | Ht 69.0 in | Wt 179.5 lb

## 2023-12-23 DIAGNOSIS — E782 Mixed hyperlipidemia: Secondary | ICD-10-CM | POA: Diagnosis not present

## 2023-12-23 DIAGNOSIS — R4586 Emotional lability: Secondary | ICD-10-CM | POA: Diagnosis not present

## 2023-12-23 DIAGNOSIS — I10 Essential (primary) hypertension: Secondary | ICD-10-CM

## 2023-12-23 DIAGNOSIS — R7303 Prediabetes: Secondary | ICD-10-CM | POA: Diagnosis not present

## 2023-12-23 LAB — LIPID PANEL
Chol/HDL Ratio: 2.3 ratio (ref 0.0–5.0)
Cholesterol, Total: 160 mg/dL (ref 100–199)
HDL: 70 mg/dL (ref >39)
LDL Chol Calc (NIH): 76 mg/dL (ref 0–99)
Triglycerides: 70 mg/dL (ref 0–149)
VLDL Cholesterol Cal: 14 mg/dL (ref 5–40)

## 2023-12-23 MED ORDER — BUPROPION HCL ER (XL) 300 MG PO TB24: 300.0000 mg | ORAL_TABLET | Freq: Every day | ORAL | 3 refills | Status: DC

## 2023-12-23 NOTE — Progress Notes (Signed)
 Subjective:   [name] Park 06/06/1951 12/23/2023  Chief Complaint  Patient presents with   Medical Management of Chronic Issues    46-month follow up; denies any main concerns for today's visit.    HPI: [name] Park presents today for re-assessment and management of chronic medical conditions.  MOOD CHANGES:  Patient repots change in mood recently with mildly increased irritability per patient and patient's wife and increased inattention and activity. He states ever since stopping bisoprolol  for BP control in the past he has noticed increase in energy and issues with focus, inattention, walking quickly compared to wife. He is interested in increasing Wellbutrin . He denies impairment with sleep, mood cycling, anger or impulsivity.    HYPERLIPIDEMIA: [name] Park presents for the medical management of hyperlipidemia.  Patient's current HLD regimen is: Atorvastatin  40mg   Patient is  currently taking prescribed medications for HLD.  Adhering to heathy diet: Yes Exercising regularly: Yes Denies myalgias.  Lab Results  Component Value Date   CHOL 131 05/15/2023   HDL 60 05/15/2023   LDLCALC 57 05/15/2023   TRIG 67 05/15/2023   CHOLHDL 2.2 05/15/2023    HYPERTENSION: [name] Park presents for the medical management of hypertension.  Patient's current hypertension medication regimen is: Losartan  50mg  every day, and Furosemide  20mg  every other day.  He continues to take his Aspirin  81mg  twice weekly per cardiology due to bruising.  Patient is  currently taking prescribed medications for HTN.  Patient is  regularly keeping a check on BP at home.  Adhering to low sodium diet: Yes Exercising Regularly: No Denies headache, dizziness, CP, SHOB, vision changes.    BP Readings from Last 3 Encounters:  12/23/23 126/61  09/16/23 131/71  08/19/23 118/68   IMPAIRED FASTING GLUCOSE [name] Park is here for medical management of impaired fasting glucose.  Patient's  current IFG medication regimen is: Diet,Exercise Adhering to a diabetic diet: Yes Exercising Regularly: No Checking Blood Sugars: No Denies polydipsia, polyphagia, polyuria.   Lab Results  Component Value Date   HGBA1C 5.6 09/16/2023    The following portions of the patient's history were reviewed and updated as appropriate: past medical history, past surgical history, family history, social history, allergies, medications, and problem list.   Patient Active Problem List   Diagnosis Date Noted   Anemia due to chronic kidney disease 09/16/2023   Urinary tract infection without hematuria    Medication management 10/06/2018   Abnormal glucose 10/06/2018   Pulmonary nodules 07/02/2018   Prediabetes 04/01/2018   Chronic kidney disease, stage 3 (HCC) 06/11/2017   Overweight (BMI 25.0-29.9) 06/10/2017   Former smoker (54 pack year) 06/10/2017   Thoracic aorta atherosclerosis (HCC) by Chest CT scan in 2017 08/11/2016   COPD (chronic obstructive pulmonary disease) with chronic bronchitis (HCC) 09/07/2014   Generalized anxiety disorder 09/07/2014   Hyperlipidemia, mixed 06/08/2014   Gastroesophageal reflux disease 03/08/2014   OAB (overactive bladder) 03/08/2014   Diverticulosis 03/08/2014   Essential hypertension 12/05/2013   Vitamin D  deficiency 06/21/2013   Past Medical History:  Diagnosis Date   Acute appendicitis 11/24/2016   Chronic kidney disease 2017   COPD (chronic obstructive pulmonary disease) (HCC)    GERD (gastroesophageal reflux disease)    Hematuria    HLD (hyperlipidemia)    Hypertension    IBS (irritable bowel syndrome)    Pre-diabetes    Vitamin D  deficiency    Past Surgical History:  Procedure Laterality Date   CARPAL TUNNEL RELEASE  Right    CATARACT EXTRACTION, BILATERAL Bilateral    Dr. Charmayne   CERVICAL FUSION     HAND SURGERY Left    LAPAROSCOPIC APPENDECTOMY N/A 11/24/2016   Procedure: APPENDECTOMY LAPAROSCOPIC;  Surgeon: Signe Mitzie LABOR, MD;   Location: [facility] OR;  Service: General;  Laterality: N/A;   TRIGGER FINGER RELEASE Right    thumb   Family History  Problem Relation Age of Onset   Irritable bowel syndrome Mother    Diverticulosis Mother    Lupus Mother    COPD Mother    Hypertension Mother    Lung cancer Father    Other Father        bladder polyps   Stroke Father    Kidney disease Father    Cancer Father    Colon cancer Neg Hx    Esophageal cancer Neg Hx    Rectal cancer Neg Hx    Stomach cancer Neg Hx    Outpatient Medications Prior to Visit  Medication Sig Dispense Refill   aspirin  EC 81 MG tablet Take 81 mg by mouth 2 (two) times a week. Swallow whole.     atorvastatin  (LIPITOR) 40 MG tablet TAKE 1 TABLET BY MOUTH DAILY FOR CHOLESTEROL 100 tablet 2   Cholecalciferol  (VITAMIN D -3) 1000 UNITS CAPS Take 5,000 Units by mouth daily.      Fluticasone -Umeclidin-Vilant (TRELEGY ELLIPTA ) 100-62.5-25 MCG/ACT AEPB INHALE 1 INHALATION BY MOUTH  ONCE DAILY AT THE SAME TIME EACH DAY 120 each 2   furosemide  (LASIX ) 20 MG tablet TAKE 1 TABLET BY MOUTH EVERY  OTHER DAY 45 tablet 3   losartan  (COZAAR ) 50 MG tablet Take 1 tablet (50 mg total) by mouth daily. 90 tablet 3   oxybutynin  (DITROPAN  XL) 15 MG 24 hr tablet Take 15 mg by mouth daily.     tamsulosin  (FLOMAX ) 0.4 MG CAPS capsule Take 1 tablet at Bedtime for Prostate 90 capsule 3   buPROPion  (WELLBUTRIN  XL) 150 MG 24 hr tablet TAKE 1 TABLET BY MOUTH  DAILY FOR MOOD, FOCUS, AND  CONCENTRATION 90 tablet 3   No facility-administered medications prior to visit.   No Known Allergies   ROS: A complete ROS was performed with pertinent positives/negatives noted in the HPI. The remainder of the ROS are negative.    Objective:   Today's Vitals   12/23/23 0928  BP: 126/61  Pulse: 79  SpO2: 97%  Weight: 179 lb 8 oz (81.4 kg)  Height: 5' 9 (1.753 m)    Physical Exam          GENERAL: Well-appearing, in NAD. Well nourished.  SKIN: Pink, warm and dry.  Head:  Normocephalic. NECK: Trachea midline. Full ROM w/o pain or tenderness.  RESPIRATORY: Chest wall symmetrical. Respirations even and non-labored. Breath sounds clear to auscultation bilaterally.  CARDIAC: S1, S2 present, regular rate and rhythm without murmur or gallops. Peripheral pulses 2+ bilaterally.  MSK: Muscle tone and strength appropriate for age.  NEUROLOGIC: No motor or sensory deficits. Steady, even gait. C2-C12 intact.  PSYCH/MENTAL STATUS: Alert, oriented x 3. Cooperative, appropriate mood and affect.     Assessment & Plan:  1. Mood change Recommend increasing Wellbutrin  to 300mg  XR daily. Discussed possible triggers and reducing stressors. Will plan to follow up in 6 months or sooner as needed as patient tolerated Wellbutrin  well in the past. Safe use of medication reviewed with patient.  - buPROPion  (WELLBUTRIN  XL) 300 MG 24 hr tablet; Take 1 tablet (300 mg total) by mouth daily.  TAKE 1 TABLET BY MOUTH  DAILY FOR MOOD, FOCUS, AND  CONCENTRATION  Dispense: 90 tablet; Refill: 3  2. Essential hypertension (Primary) Controlled. Continue current regimen.   3. Hyperlipidemia, mixed Controlled previously. Will check LP with fasting lab today.  - Lipid panel   4. Prediabetes Controlled. Continue current diet and exercise.    Meds ordered this encounter  Medications   buPROPion  (WELLBUTRIN  XL) 300 MG 24 hr tablet    Sig: Take 1 tablet (300 mg total) by mouth daily. TAKE 1 TABLET BY MOUTH  DAILY FOR MOOD, FOCUS, AND  CONCENTRATION    Dispense:  90 tablet    Refill:  3    Please send a replace/new response with 100-Day Supply if appropriate to maximize member benefit. Requesting 1 year supply.    Supervising Provider:   DE PERU, [name] J [[id number]]   Lab Orders         Lipid panel     Return in about 6 months (around 06/24/2024) for Scheduled for AE in Dec. .    Patient to reach out to office if new, worrisome, or unresolved symptoms arise or if no improvement in patient's  condition. Patient verbalized understanding and is agreeable to treatment plan. All questions answered to patient's satisfaction.    Thersia Schuyler Stark, [place]

## 2023-12-24 ENCOUNTER — Ambulatory Visit (HOSPITAL_BASED_OUTPATIENT_CLINIC_OR_DEPARTMENT_OTHER): Payer: Self-pay | Admitting: Family Medicine

## 2023-12-24 NOTE — Progress Notes (Signed)
 Keny[place],  Your cholesterol is well controlled. We will continue your medication without changes.

## 2024-01-14 ENCOUNTER — Ambulatory Visit: Admitting: Pulmonary Disease

## 2024-01-14 DIAGNOSIS — J984 Other disorders of lung: Secondary | ICD-10-CM | POA: Diagnosis not present

## 2024-01-14 DIAGNOSIS — R942 Abnormal results of pulmonary function studies: Secondary | ICD-10-CM

## 2024-01-14 LAB — PULMONARY FUNCTION TEST
DL/VA % pred: 107 %
DL/VA: 4.28 ml/min/mmHg/L
DLCO cor % pred: 82 %
DLCO cor: 20.19 ml/min/mmHg
DLCO unc % pred: 82 %
DLCO unc: 20.19 ml/min/mmHg
FEF 25-75 Post: 2.65 L/s
FEF 25-75 Pre: 2.46 L/s
FEF2575-%Change-Post: 7 %
FEF2575-%Pred-Post: 121 %
FEF2575-%Pred-Pre: 112 %
FEV1-%Change-Post: 0 %
FEV1-%Pred-Post: 81 %
FEV1-%Pred-Pre: 80 %
FEV1-Post: 2.41 L
FEV1-Pre: 2.4 L
FEV1FVC-%Change-Post: -2 %
FEV1FVC-%Pred-Pre: 111 %
FEV6-%Change-Post: 2 %
FEV6-%Pred-Post: 78 %
FEV6-%Pred-Pre: 77 %
FEV6-Post: 3.04 L
FEV6-Pre: 2.97 L
FEV6FVC-%Change-Post: 0 %
FEV6FVC-%Pred-Post: 106 %
FEV6FVC-%Pred-Pre: 106 %
FVC-%Change-Post: 3 %
FVC-%Pred-Post: 74 %
FVC-%Pred-Pre: 72 %
FVC-Post: 3.06 L
FVC-Pre: 2.97 L
Post FEV1/FVC ratio: 79 %
Post FEV6/FVC ratio: 100 %
Pre FEV1/FVC ratio: 81 %
Pre FEV6/FVC Ratio: 100 %
RV % pred: 58 %
RV: 1.45 L
TLC % pred: 67 %
TLC: 4.61 L

## 2024-01-14 NOTE — Progress Notes (Signed)
 Full PFT performed today.

## 2024-01-14 NOTE — Patient Instructions (Signed)
 Full PFT performed today.

## 2024-01-21 ENCOUNTER — Other Ambulatory Visit (HOSPITAL_BASED_OUTPATIENT_CLINIC_OR_DEPARTMENT_OTHER): Payer: Self-pay

## 2024-01-21 ENCOUNTER — Ambulatory Visit (HOSPITAL_BASED_OUTPATIENT_CLINIC_OR_DEPARTMENT_OTHER): Admitting: Family Medicine

## 2024-01-21 ENCOUNTER — Encounter (HOSPITAL_BASED_OUTPATIENT_CLINIC_OR_DEPARTMENT_OTHER): Payer: Self-pay | Admitting: Family Medicine

## 2024-01-21 VITALS — BP 131/57 | HR 57 | Temp 98.1°F | Wt 180.3 lb

## 2024-01-21 DIAGNOSIS — N3001 Acute cystitis with hematuria: Secondary | ICD-10-CM | POA: Diagnosis not present

## 2024-01-21 DIAGNOSIS — R350 Frequency of micturition: Secondary | ICD-10-CM | POA: Diagnosis not present

## 2024-01-21 DIAGNOSIS — F411 Generalized anxiety disorder: Secondary | ICD-10-CM | POA: Diagnosis not present

## 2024-01-21 LAB — POCT URINALYSIS DIP (CLINITEK)
Bilirubin, UA: NEGATIVE
Glucose, UA: NEGATIVE mg/dL
Ketones, POC UA: NEGATIVE mg/dL
Nitrite, UA: NEGATIVE
Spec Grav, UA: 1.015 (ref 1.010–1.025)
Urobilinogen, UA: 0.2 U/dL
pH, UA: 6.5 (ref 5.0–8.0)

## 2024-01-21 MED ORDER — SULFAMETHOXAZOLE-TRIMETHOPRIM 800-160 MG PO TABS
1.0000 | ORAL_TABLET | Freq: Two times a day (BID) | ORAL | 0 refills | Status: AC
  Filled 2024-01-21: qty 20, 10d supply, fill #0

## 2024-01-21 MED ORDER — BUPROPION HCL ER (SR) 100 MG PO TB12
100.0000 mg | ORAL_TABLET | Freq: Every day | ORAL | 0 refills | Status: DC
  Filled 2024-01-21: qty 60, 60d supply, fill #0

## 2024-01-21 MED ORDER — BUPROPION HCL 75 MG PO TABS
75.0000 mg | ORAL_TABLET | Freq: Two times a day (BID) | ORAL | 1 refills | Status: DC
  Filled 2024-01-21: qty 45, 23d supply, fill #0

## 2024-01-21 MED ORDER — BUPROPION HCL 75 MG PO TABS
75.0000 mg | ORAL_TABLET | Freq: Every day | ORAL | 0 refills | Status: DC
  Filled 2024-01-21: qty 45, 45d supply, fill #0

## 2024-01-21 NOTE — Patient Instructions (Addendum)
 Wellbutrin :  Start : 100mg  once daily for 8 weeks (60 days).  Then take 75mg  once daily for 4 weeks (30 days)  Take 75mg  every other day for 2 weeks (14 days) and STOP.

## 2024-01-21 NOTE — Progress Notes (Signed)
 Acute Care Office Visit  Subjective:   [name] Park 1951/03/31 01/21/2024  Chief Complaint  Patient presents with   Urinary Tract Infection    Onset: last night    HPI: URINARY SYMPTOMS Onset: Saturday night, worse last night. Reports he was up 6x last night to void.   Fever/chills: no Dysuria: burning Urinary frequency: yes Urgency: yes Foul odor: no Urinary incontinence: yes Hematuria: no Abdominal pain: no Suprapubic pain/pressure: no Flank/low back pain: no Nausea/Vomiting: no  Treatments tried: None   Previous urinary tract infection: no Recurrent urinary tract infection: no History of sexually transmitted disease: no  WEANING FROM WELLBUTRIN :  Pt requesting to come off of his Wellbutrin , states he started taking it in 2005 for smoking cessation, and hasn't smoked in 22yrs but is still on the medication. He states he feels hyper, irritable, and wound up all the time and wants to stop the Wellbutrin  to see if that helps. He is currently taking Wellbutrin  150mg  XR that he had left over from prior.   The following portions of the patient's history were reviewed and updated as appropriate: past medical history, past surgical history, family history, social history, allergies, medications, and problem list.   Patient Active Problem List   Diagnosis Date Noted   Anemia due to chronic kidney disease 09/16/2023   Urinary tract infection without hematuria    Medication management 10/06/2018   Abnormal glucose 10/06/2018   Pulmonary nodules 07/02/2018   Prediabetes 04/01/2018   Chronic kidney disease, stage 3 (HCC) 06/11/2017   Overweight (BMI 25.0-29.9) 06/10/2017   Former smoker (54 pack year) 06/10/2017   Thoracic aorta atherosclerosis (HCC) by Chest CT scan in 2017 08/11/2016   COPD (chronic obstructive pulmonary disease) with chronic bronchitis (HCC) 09/07/2014   Generalized anxiety disorder 09/07/2014   Hyperlipidemia, mixed 06/08/2014    Gastroesophageal reflux disease 03/08/2014   OAB (overactive bladder) 03/08/2014   Diverticulosis 03/08/2014   Essential hypertension 12/05/2013   Vitamin D  deficiency 06/21/2013   Past Medical History:  Diagnosis Date   Acute appendicitis 11/24/2016   Chronic kidney disease 2017   COPD (chronic obstructive pulmonary disease) (HCC)    GERD (gastroesophageal reflux disease)    Hematuria    HLD (hyperlipidemia)    Hypertension    IBS (irritable bowel syndrome)    Pre-diabetes    Vitamin D  deficiency    Past Surgical History:  Procedure Laterality Date   CARPAL TUNNEL RELEASE Right    CATARACT EXTRACTION, BILATERAL Bilateral    Dr. Charmayne   CERVICAL FUSION     HAND SURGERY Left    LAPAROSCOPIC APPENDECTOMY N/A 11/24/2016   Procedure: APPENDECTOMY LAPAROSCOPIC;  Surgeon: Signe Mitzie LABOR, MD;  Location: [facility] OR;  Service: General;  Laterality: N/A;   TRIGGER FINGER RELEASE Right    thumb   Family History  Problem Relation Age of Onset   Irritable bowel syndrome Mother    Diverticulosis Mother    Lupus Mother    COPD Mother    Hypertension Mother    Lung cancer Father    Other Father        bladder polyps   Stroke Father    Kidney disease Father    Cancer Father    Colon cancer Neg Hx    Esophageal cancer Neg Hx    Rectal cancer Neg Hx    Stomach cancer Neg Hx    Outpatient Medications Prior to Visit  Medication Sig Dispense Refill   aspirin   EC 81 MG tablet Take 81 mg by mouth 2 (two) times a week. Swallow whole.     atorvastatin  (LIPITOR) 40 MG tablet TAKE 1 TABLET BY MOUTH DAILY FOR CHOLESTEROL 100 tablet 2   Cholecalciferol  (VITAMIN D -3) 1000 UNITS CAPS Take 5,000 Units by mouth daily.      Fluticasone -Umeclidin-Vilant (TRELEGY ELLIPTA ) 100-62.5-25 MCG/ACT AEPB INHALE 1 INHALATION BY MOUTH  ONCE DAILY AT THE SAME TIME EACH DAY 120 each 2   furosemide  (LASIX ) 20 MG tablet TAKE 1 TABLET BY MOUTH EVERY  OTHER DAY 45 tablet 3   losartan  (COZAAR ) 50 MG tablet Take 1  tablet (50 mg total) by mouth daily. 90 tablet 3   oxybutynin  (DITROPAN  XL) 15 MG 24 hr tablet Take 15 mg by mouth daily.     tamsulosin  (FLOMAX ) 0.4 MG CAPS capsule Take 1 tablet at Bedtime for Prostate 90 capsule 3   buPROPion  (WELLBUTRIN  XL) 300 MG 24 hr tablet Take 1 tablet (300 mg total) by mouth daily. TAKE 1 TABLET BY MOUTH  DAILY FOR MOOD, FOCUS, AND  CONCENTRATION 90 tablet 3   No facility-administered medications prior to visit.   No Known Allergies   ROS: A complete ROS was performed with pertinent positives/negatives noted in the HPI. The remainder of the ROS are negative.    Objective:   Today's Vitals   01/21/24 1526  BP: (!) 131/57  Pulse: (!) 57  Temp: 98.1 F (36.7 C)  TempSrc: Oral  SpO2: 99%  Weight: 180 lb 4.8 oz (81.8 kg)  PainSc: 8     GENERAL: Well-appearing, in NAD. Well nourished.  SKIN: Pink, warm and dry. Head: Normocephalic. NECK: Trachea midline. Full ROM w/o pain or tenderness.  THROAT: Mucous membranes pink and moist.  RESPIRATORY: Chest wall symmetrical. Respirations even and non-labored.  CARDIAC: S1, S2 present, regular rate and rhythm without murmur or gallops. Peripheral pulses 2+ bilaterally.  GI: No CVA tenderness with palpation. No tenderness with palpation of diffuse lower abdomen.  MSK: Muscle tone and strength appropriate for age.  EXTREMITIES: Without clubbing, cyanosis, or edema.  NEUROLOGIC: No motor or sensory deficits. Steady, even gait. C2-C12 intact.  PSYCH/MENTAL STATUS: Alert, oriented x 3. Cooperative, appropriate mood and affect.    Results for orders placed or performed in visit on 01/21/24  POCT URINALYSIS DIP (CLINITEK)  Result Value Ref Range   Color, UA yellow yellow   Clarity, UA turbid (A) clear   Glucose, UA negative negative mg/dL   Bilirubin, UA negative negative   Ketones, POC UA negative negative mg/dL   Spec Grav, UA 8.984 8.989 - 1.025   Blood, UA trace-intact (A) negative   pH, UA 6.5 5.0 - 8.0    POC PROTEIN,UA trace negative, trace   Urobilinogen, UA 0.2 0.2 or 1.0 E.U./dL   Nitrite, UA Negative Negative   Leukocytes, UA Small (1+) (A) Negative      Assessment & Plan:  1. Acute cystitis with hematuria 2. Frequency of urination (Primary) + for UTI with urine sample today. Will start Bactrim  1 tab BID x 10 days and send urine for culture.  - Urine Culture - POCT urine qual dipstick protein - POCT URINALYSIS DIP (CLINITEK)  3. Generalized anxiety disorder  Pt requesting to stop taking Wellbutrin  d/t feeling more anxious while taking it. Weaning schedule provided. Will stop Wellbutrin  150mg  XR and start Wellbutrin  100 SR once daily for 60 days, followed by Wellbutrin  75mg  once daily for 30 days, followed by Wellbutrin  75mg  every other  day for 14 days.    Meds ordered this encounter  Medications   buPROPion  ER (WELLBUTRIN  SR) 100 MG 12 hr tablet    Sig: Take 1 tablet (100 mg total) by mouth daily.    Dispense:  60 tablet    Refill:  0    Supervising Provider:   DE PERU, [name] J [[id number]]   DISCONTD: buPROPion  (WELLBUTRIN ) 75 MG tablet    Sig: Take 1 tablet (75 mg total) by mouth 2 (two) times daily.    Dispense:  45 tablet    Refill:  1    Supervising Provider:   DE PERU, [name] J [[id number]]   sulfamethoxazole -trimethoprim  (BACTRIM  DS) 800-160 MG tablet    Sig: Take 1 tablet by mouth 2 (two) times daily for 10 days.    Dispense:  20 tablet    Refill:  0    Supervising Provider:   DE PERU, [name] J [[id number]]   buPROPion  (WELLBUTRIN ) 75 MG tablet    Sig: Take 1 tablet (75 mg total) by mouth daily.    Dispense:  45 tablet    Refill:  0    Supervising Provider:   DE PERU, [name] J [[id number]]   Lab Orders         Urine Culture         POCT urine qual dipstick protein         POCT URINALYSIS DIP (CLINITEK)      Return if symptoms worsen or fail to improve.   Patient to reach out to office if new, worrisome, or unresolved symptoms arise or if no improvement in  patient's condition. Patient verbalized understanding and is agreeable to treatment plan. All questions answered to patient's satisfaction.   Thersia Stark, FNP-C

## 2024-01-24 ENCOUNTER — Ambulatory Visit: Payer: Self-pay | Admitting: Pulmonary Disease

## 2024-01-25 ENCOUNTER — Emergency Department (HOSPITAL_BASED_OUTPATIENT_CLINIC_OR_DEPARTMENT_OTHER)
Admission: EM | Admit: 2024-01-25 | Discharge: 2024-01-25 | Disposition: A | Discharge status: Home / Self Care | Attending: Emergency Medicine | Admitting: Emergency Medicine

## 2024-01-25 ENCOUNTER — Ambulatory Visit (HOSPITAL_BASED_OUTPATIENT_CLINIC_OR_DEPARTMENT_OTHER): Payer: Self-pay | Admitting: Family Medicine

## 2024-01-25 ENCOUNTER — Ambulatory Visit: Payer: Self-pay | Admitting: Family Medicine

## 2024-01-25 ENCOUNTER — Other Ambulatory Visit: Payer: Self-pay

## 2024-01-25 DIAGNOSIS — Z79899 Other long term (current) drug therapy: Secondary | ICD-10-CM | POA: Insufficient documentation

## 2024-01-25 DIAGNOSIS — Z7982 Long term (current) use of aspirin: Secondary | ICD-10-CM | POA: Insufficient documentation

## 2024-01-25 DIAGNOSIS — R338 Other retention of urine: Secondary | ICD-10-CM

## 2024-01-25 DIAGNOSIS — R4182 Altered mental status, unspecified: Secondary | ICD-10-CM | POA: Insufficient documentation

## 2024-01-25 DIAGNOSIS — R339 Retention of urine, unspecified: Secondary | ICD-10-CM | POA: Diagnosis not present

## 2024-01-25 LAB — CBC WITH DIFFERENTIAL/PLATELET
Abs Immature Granulocytes: 0.02 K/uL (ref 0.00–0.07)
Basophils Absolute: 0 K/uL (ref 0.0–0.1)
Basophils Relative: 0 %
Eosinophils Absolute: 0.1 K/uL (ref 0.0–0.5)
Eosinophils Relative: 1 %
HCT: 31.8 % — ABNORMAL LOW (ref 39.0–52.0)
Hemoglobin: 11.2 g/dL — ABNORMAL LOW (ref 13.0–17.0)
Immature Granulocytes: 0 %
Lymphocytes Relative: 15 %
Lymphs Abs: 1.2 K/uL (ref 0.7–4.0)
MCH: 30.9 pg (ref 26.0–34.0)
MCHC: 35.2 g/dL (ref 30.0–36.0)
MCV: 87.6 fL (ref 80.0–100.0)
Monocytes Absolute: 1.1 K/uL — ABNORMAL HIGH (ref 0.1–1.0)
Monocytes Relative: 14 %
Neutro Abs: 5.6 K/uL (ref 1.7–7.7)
Neutrophils Relative %: 70 %
Platelets: 249 K/uL (ref 150–400)
RBC: 3.63 MIL/uL — ABNORMAL LOW (ref 4.22–5.81)
RDW: 12.9 % (ref 11.5–15.5)
WBC: 8 K/uL (ref 4.0–10.5)
nRBC: 0 % (ref 0.0–0.2)

## 2024-01-25 LAB — URINALYSIS, ROUTINE W REFLEX MICROSCOPIC
Bilirubin Urine: NEGATIVE
Glucose, UA: NEGATIVE mg/dL
Hgb urine dipstick: NEGATIVE
Ketones, ur: 15 mg/dL — AB
Leukocytes,Ua: NEGATIVE
Nitrite: NEGATIVE
Specific Gravity, Urine: 1.013 (ref 1.005–1.030)
pH: 6 (ref 5.0–8.0)

## 2024-01-25 LAB — BASIC METABOLIC PANEL WITH GFR
Anion gap: 16 — ABNORMAL HIGH (ref 5–15)
BUN: 20 mg/dL (ref 8–23)
CO2: 16 mmol/L — ABNORMAL LOW (ref 22–32)
Calcium: 9.2 mg/dL (ref 8.9–10.3)
Chloride: 91 mmol/L — ABNORMAL LOW (ref 98–111)
Creatinine, Ser: 1.68 mg/dL — ABNORMAL HIGH (ref 0.61–1.24)
GFR, Estimated: 42 mL/min — ABNORMAL LOW (ref >60)
Glucose, Bld: 86 mg/dL (ref 70–99)
Potassium: 4.1 mmol/L (ref 3.5–5.1)
Sodium: 123 mmol/L — ABNORMAL LOW (ref 135–145)

## 2024-01-25 LAB — URINE CULTURE

## 2024-01-25 LAB — LACTIC ACID, PLASMA: Lactic Acid, Venous: 0.7 mmol/L (ref 0.5–1.9)

## 2024-01-25 MED ORDER — SODIUM CHLORIDE 0.9 % IV BOLUS
1000.0000 mL | Freq: Once | INTRAVENOUS | Status: AC
  Administered 2024-01-25: 1000 mL via INTRAVENOUS

## 2024-01-25 NOTE — ED Triage Notes (Signed)
 Sent from PCP. Family states patient has had some hallucinations and acting off since taking antibiotics for UTI. Patient a&Ox4.

## 2024-01-25 NOTE — Telephone Encounter (Signed)
 Routing to [place] for review. Please advise.

## 2024-01-25 NOTE — Progress Notes (Signed)
 Discussed results and worsening symptoms such as hallucinations with patient over the phone. Given worsening and culture results, patient will likely need IV abx such as vancomyin. He will proceed to ED at [facility] presently. Report provided to charge RN [place].

## 2024-01-25 NOTE — ED Notes (Signed)
 PT placed in a leg bag for urinary catheter. PT and family at bedside given education on leg bag use and verbalized understanding of instructions. No further questions at this time, advised to return to the ED with any issues

## 2024-01-25 NOTE — Telephone Encounter (Signed)
 FYI Only or Action Required?: Action required by provider: update on patient condition.  Patient was last seen in primary care on 01/21/2024 by Knute Thersia Bitters, FNP.  Called Nurse Triage reporting Medication Reaction.  Symptoms began several days ago.  Symptoms are: unchanged.  Triage Disposition: See Physician Within 24 Hours  Patient/caregiver understands and will follow disposition?: Yes                     Copied from CRM [id number]. Topic: Clinical - Red Word Triage >> Jan 25, 2024  9:20 AM Antwanette L wrote: Red Word that prompted transfer to Nurse Triage: Pt Is taking sulfamethoxazole -trimethoprim  (BACTRIM  DS) 800-160 MG tablet and its causing pain while urinating and hallucinations. Reason for Disposition  All other males with painful urination  Answer Assessment - Initial Assessment Questions Pt feels like the bactrim  is not working; started last Thurs. Pt also states he got his [facility] labs back and wants the interpretation. Pt requests a call back today at [phone]. Pt states he is willing to come back in for an appt if needed.  Pain with urination has not stopped; remained the same Hallucinations seeing puzzles on floor when walking around at night; started Sat night Lack of sleep  Answer Assessment - Initial Assessment Questions Pt feels like the bactrim  is not working; started last Thurs  Pain with urination has not stopped; remained the same Hallucinations seeing puzzles on floor when walking around at night; started Sat night Lack of sleep   1. SYMPTOM: What's the main symptom you're concerned about?      *No Answer* 2. ONSET: When did the  *No Answer*  start?     *No Answer* 3. SEVERITY: How bad is the *No Answer*?      *No Answer* 4. DRINKING: Does your child drink more fluids than other children?  If so, ask, How much more? and When did this start? (Remember that increased fluid intake causes increased urinary  frequency)     *No Answer* 5. CAUSE: What do you think is causing the symptom?     *No Answer* 6. OTHER SYMPTOMS: Does your child have any other symptoms? (e.g., flank pain, blood in urine, pain with urination, abdominal pain)     *No Answer* 7. FEVER: Does your child have a fever? If so, ask: What is it, how was it measured, and when did it start?     *No Answer* 8. CHILD'S APPEARANCE: How sick is your child acting?  What is he doing right now? If asleep, ask: How was he acting before he went to sleep?     *No Answer*  Protocols used: Urination - All Other Symptoms-P-AH, Urination Pain - Male-A-AH

## 2024-01-25 NOTE — Discharge Instructions (Signed)
 You are unable to empty your bladder.  We placed a Foley catheter.  This typically is evaluated by the urologist in about a week.  Please call them today to set up an appointment.  Your lab work looks like you are a bit dehydrated.  Please call your family doctor today to try and set up an appointment in a week or 2 to have your labs rechecked.  I am not sure that you have a true bladder infection.  The culture report is consistent with a bacteria that lives on the skin.  I think it is worthwhile to continue your antibiotics to completion.  Please return to the emergency department for confusion, worsening hallucinations, abdominal pain or fever.

## 2024-01-25 NOTE — ED Provider Notes (Signed)
 [name] AT [facility] Provider Note   CSN: [id number] Arrival date & time: 01/25/24  1403     Patient presents with: Altered Mental Status   [name] Park is a 73 y.o. male.   73 yo M with a chief complaints of difficulty urinating.  Patient states that he has been able to pee but is only able to urinate a small amount going on for almost a week now.  He had seen his family doctor at the onset and they diagnosed him with a urinary tract infection and he has been on Bactrim .  He also had his Wellbutrin  reduced.  Denies any other medication changes.  Denies fevers.  Denies nausea or vomiting.   Altered Mental Status      Prior to Admission medications   Medication Sig Start Date End Date Taking? Authorizing Provider  aspirin  EC 81 MG tablet Take 81 mg by mouth 2 (two) times a week. Swallow whole.    [provider]  atorvastatin  (LIPITOR) 40 MG tablet TAKE 1 TABLET BY MOUTH DAILY FOR CHOLESTEROL 02/26/23   Wilkinson, Dana E, NP  buPROPion  (WELLBUTRIN ) 75 MG tablet Take 1 tablet (75 mg total) by mouth daily. 01/21/24   Caudle, Thersia Bitters, FNP  buPROPion  ER (WELLBUTRIN  SR) 100 MG 12 hr tablet Take 1 tablet (100 mg total) by mouth daily. 01/21/24   Caudle, Thersia Bitters, FNP  Cholecalciferol  (VITAMIN D -3) 1000 UNITS CAPS Take 5,000 Units by mouth daily.     [provider]  Fluticasone -Umeclidin-Vilant (TRELEGY ELLIPTA ) 100-62.5-25 MCG/ACT AEPB INHALE 1 INHALATION BY MOUTH  ONCE DAILY AT THE SAME TIME EACH DAY 12/14/23   Kara Dorn NOVAK, MD  furosemide  (LASIX ) 20 MG tablet TAKE 1 TABLET BY MOUTH EVERY  OTHER DAY 08/31/23   Croitoru, Mihai, MD  losartan  (COZAAR ) 50 MG tablet Take 1 tablet (50 mg total) by mouth daily. 07/07/23 07/06/24  Wilkinson, Dana E, NP  oxybutynin  (DITROPAN  XL) 15 MG 24 hr tablet Take 15 mg by mouth daily. 06/20/23   [provider]  sulfamethoxazole -trimethoprim  (BACTRIM  DS) 800-160 MG tablet Take 1 tablet  by mouth 2 (two) times daily for 10 days. 01/21/24 01/31/24  Knute Thersia Bitters, FNP  tamsulosin  (FLOMAX ) 0.4 MG CAPS capsule Take 1 tablet at Bedtime for Prostate 11/28/20   Tonita Fallow, MD    Allergies: Patient has no known allergies.    Review of Systems  Updated Vital Signs BP (!) 147/61 (BP Location: Right Arm)   Pulse 90   Temp 98.5 F (36.9 C) (Oral)   Resp 16   SpO2 96%   Physical Exam Vitals and nursing note reviewed.  Constitutional:      Appearance: He is well-developed.  HENT:     Head: Normocephalic and atraumatic.  Eyes:     Pupils: Pupils are equal, round, and reactive to light.  Neck:     Vascular: No JVD.  Cardiovascular:     Rate and Rhythm: Normal rate and regular rhythm.     Heart sounds: No murmur heard.    No friction rub. No gallop.  Pulmonary:     Effort: No respiratory distress.     Breath sounds: No wheezing.  Abdominal:     General: There is no distension.     Tenderness: There is no abdominal tenderness. There is no guarding or rebound.     Comments: Painful palpable bladder  Musculoskeletal:        General: Normal range of motion.  Cervical back: Normal range of motion and neck supple.  Skin:    Coloration: Skin is not pale.     Findings: No rash.  Neurological:     Mental Status: He is alert and oriented to person, place, and time.  Psychiatric:        Behavior: Behavior normal.     (all labs ordered are listed, but only abnormal results are displayed) Labs Reviewed  URINALYSIS, ROUTINE W REFLEX MICROSCOPIC - Abnormal; Notable for the following components:      Result Value   Ketones, ur 15 (*)    Protein, ur TRACE (*)    All other components within normal limits  CBC WITH DIFFERENTIAL/PLATELET - Abnormal; Notable for the following components:   RBC 3.63 (*)    Hemoglobin 11.2 (*)    HCT 31.8 (*)    Monocytes Absolute 1.1 (*)    All other components within normal limits  BASIC METABOLIC PANEL WITH GFR - Abnormal;  Notable for the following components:   Sodium 123 (*)    Chloride 91 (*)    CO2 16 (*)    Creatinine, Ser 1.68 (*)    GFR, Estimated 42 (*)    Anion gap 16 (*)    All other components within normal limits  LACTIC ACID, PLASMA    EKG: None  Radiology: No results found.   Procedures   Medications Ordered in the ED  sodium chloride  0.9 % bolus 1,000 mL (1,000 mLs Intravenous New Bag/Given 01/25/24 1538)                                    Medical Decision Making Amount and/or Complexity of Data Reviewed Labs: ordered.   73 yo M with a chief complaints of difficulty urinating.  Going on for about a week now.  Saw his PCP who diagnosed him with a urinary tract infection and has been on antibiotics.  On my record review there is also some documentation that he had been having hallucinations.  When asked the patient and his wife about this they mentioned that he when he gets to urinate in the middle the night feels like sometimes he can see puzzle pieces on the floor that are not there.  He tells me he really has not been able to get any sleep in the past few nights because he gets up every 10 minutes to try and urinate.  Foley catheter placed with symptomatic improvement.  Patient's UA without obvious infection though perhaps obscured with antibiotic use.  There are small ketones.  He has a metabolic acidosis with anion gap.  Lactate is normal.  Given IV fluid bolus here.  Likely needs lab recheck in a week or 2.  Urology follow-up.  4:24 PM:  I have discussed the diagnosis/risks/treatment options with the patient and family.  Evaluation and diagnostic testing in the emergency department does not suggest an emergent condition requiring admission or immediate intervention beyond what has been performed at this time.  They will follow up with PCP. We also discussed returning to the ED immediately if new or worsening sx occur. We discussed the sx which are most concerning (e.g.,  sudden worsening pain, fever, inability to tolerate by mouth, worsening confusion) that necessitate immediate return. Medications administered to the patient during their visit and any new prescriptions provided to the patient are listed below.  Medications given during this visit Medications  sodium  chloride 0.9 % bolus 1,000 mL (1,000 mLs Intravenous New Bag/Given 01/25/24 1538)     The patient appears reasonably screen and/or stabilized for discharge and I doubt any other medical condition or other [facility] requiring further screening, evaluation, or treatment in the ED at this time prior to discharge.       Final diagnoses:  Acute urinary retention    ED Discharge Orders     None          Emil Share, DO 01/25/24 1624

## 2024-01-26 ENCOUNTER — Telehealth (HOSPITAL_BASED_OUTPATIENT_CLINIC_OR_DEPARTMENT_OTHER): Payer: Self-pay | Admitting: Family Medicine

## 2024-01-26 ENCOUNTER — Ambulatory Visit: Payer: Self-pay

## 2024-01-26 NOTE — Telephone Encounter (Signed)
 Called and spoke with pt letting him know the info per [employer]. Pt is scheduled to see urology and has a follow up scheduled with us  8/14.

## 2024-01-26 NOTE — Telephone Encounter (Signed)
 FYI Only or Action Required?: FYI only for provider.  Patient was last seen in primary care on 01/21/2024 by Knute Thersia Bitters, FNP.  Called Nurse Triage reporting Foley Care.  Symptoms began at age 73.  Interventions attempted: Nothing.  Symptoms are: stable.  Triage Disposition: Information or Advice Only Call  Patient/caregiver understands and will follow disposition?: Yes patient wants to know if he can shower with a foley catheter that was placed yesterday; I reached out to [facility] but was not able to get an answer. Patient had foley placed at the er yesterday.   Reason for Disposition  General information question, no triage required and triager able to answer question  Answer Assessment - Initial Assessment Questions 1. REASON FOR CALL: What is the main reason for your call? or How can I best help you?     Patient asking if he can take a shower with a foley  2. SYMPTOMS : Do you have any symptoms?      No  3. OTHER QUESTIONS: Do you have any other questions?     no  Protocols used: Information Only Call - No Triage-A-AH

## 2024-01-26 NOTE — Telephone Encounter (Signed)
 Copied from CRM #[id number]. Topic: Appointments - Scheduling Inquiry for Clinic >> Jan 26, 2024  9:47 AM Donee H wrote: Reason for CRM: Patient was seen in Er on 01-25-2024 and need a Er follow up visit within 1-2 weeks. There are no openings available at this time. Please follow up with patient with a time and date that he can possibly come in for the follow up. [phone]

## 2024-01-26 NOTE — Telephone Encounter (Signed)
 Alexis, please advise.

## 2024-01-29 ENCOUNTER — Other Ambulatory Visit (HOSPITAL_BASED_OUTPATIENT_CLINIC_OR_DEPARTMENT_OTHER): Payer: Self-pay | Admitting: *Deleted

## 2024-01-29 ENCOUNTER — Ambulatory Visit (HOSPITAL_BASED_OUTPATIENT_CLINIC_OR_DEPARTMENT_OTHER): Admitting: Family Medicine

## 2024-01-29 DIAGNOSIS — N3000 Acute cystitis without hematuria: Secondary | ICD-10-CM | POA: Diagnosis not present

## 2024-01-30 LAB — BASIC METABOLIC PANEL WITH GFR
BUN/Creatinine Ratio: 9 — ABNORMAL LOW (ref 10–24)
BUN: 16 mg/dL (ref 8–27)
CO2: 20 mmol/L (ref 20–29)
Calcium: 9.2 mg/dL (ref 8.6–10.2)
Chloride: 100 mmol/L (ref 96–106)
Creatinine, Ser: 1.84 mg/dL — ABNORMAL HIGH (ref 0.76–1.27)
Glucose: 119 mg/dL — ABNORMAL HIGH (ref 70–99)
Potassium: 5.5 mmol/L — ABNORMAL HIGH (ref 3.5–5.2)
Sodium: 137 mmol/L (ref 134–144)
eGFR: 38 mL/min/1.73 — ABNORMAL LOW (ref >59)

## 2024-02-01 ENCOUNTER — Ambulatory Visit (HOSPITAL_BASED_OUTPATIENT_CLINIC_OR_DEPARTMENT_OTHER): Payer: Self-pay | Admitting: Family Medicine

## 2024-02-01 NOTE — Progress Notes (Signed)
 Please let [name] Park know his sodium is stable, but his potassium has increased and his renal function has increased slightly. Has he changed any of his medications since his ER visit? Please have him increase clear fluids and I would like to recheck on Thursday.

## 2024-02-02 DIAGNOSIS — N3000 Acute cystitis without hematuria: Secondary | ICD-10-CM | POA: Diagnosis not present

## 2024-02-02 DIAGNOSIS — R338 Other retention of urine: Secondary | ICD-10-CM | POA: Diagnosis not present

## 2024-02-04 ENCOUNTER — Other Ambulatory Visit (HOSPITAL_BASED_OUTPATIENT_CLINIC_OR_DEPARTMENT_OTHER): Payer: Self-pay

## 2024-02-04 ENCOUNTER — Encounter (HOSPITAL_BASED_OUTPATIENT_CLINIC_OR_DEPARTMENT_OTHER): Payer: Self-pay | Admitting: Family Medicine

## 2024-02-04 ENCOUNTER — Ambulatory Visit (INDEPENDENT_AMBULATORY_CARE_PROVIDER_SITE_OTHER): Admitting: Family Medicine

## 2024-02-04 VITALS — BP 127/61 | HR 71 | Ht 69.0 in | Wt 178.8 lb

## 2024-02-04 DIAGNOSIS — N401 Enlarged prostate with lower urinary tract symptoms: Secondary | ICD-10-CM | POA: Diagnosis not present

## 2024-02-04 DIAGNOSIS — N138 Other obstructive and reflux uropathy: Secondary | ICD-10-CM

## 2024-02-04 DIAGNOSIS — R351 Nocturia: Secondary | ICD-10-CM | POA: Diagnosis not present

## 2024-02-04 LAB — POCT URINALYSIS DIP (CLINITEK)
Bilirubin, UA: NEGATIVE
Blood, UA: NEGATIVE
Glucose, UA: NEGATIVE mg/dL
Ketones, POC UA: NEGATIVE mg/dL
Nitrite, UA: NEGATIVE
POC PROTEIN,UA: NEGATIVE
Spec Grav, UA: 1.01 (ref 1.010–1.025)
Urobilinogen, UA: 0.2 U/dL
pH, UA: 7 (ref 5.0–8.0)

## 2024-02-04 MED ORDER — TAMSULOSIN HCL 0.4 MG PO CAPS: 0.8000 mg | ORAL_CAPSULE | Freq: Every day | ORAL | Status: DC

## 2024-02-04 MED ORDER — TAMSULOSIN HCL 0.4 MG PO CAPS
0.8000 mg | ORAL_CAPSULE | Freq: Every day | ORAL | 3 refills | Status: AC
  Filled 2024-02-04: qty 60, 30d supply, fill #0
  Filled 2024-03-30: qty 60, 30d supply, fill #1
  Filled 2024-05-05: qty 60, 30d supply, fill #2
  Filled 2024-07-12: qty 60, 30d supply, fill #3

## 2024-02-04 NOTE — Progress Notes (Signed)
 Subjective:   [name] Park 01-Jan-1951 02/04/2024  Chief Complaint  Patient presents with   Follow-up    Pt is here for an emergency room follow up. Is still having problems occasionally mainly at night where he feels like he has to urinate but will not be able to go. States he is still having burning. Urology took catheter out 2 days ago.    Discussed the use of AI scribe software for clinical note transcription with the patient, who gave verbal consent to proceed.  History of Present Illness   HPI: [name] Park presents today for re-assessment and management of BPH with nocturia.  Patient was seen by PCP on 01/21/2024 and found to have acute cystitis with hematuria.  He was treated with Bactrim  and called approximately 4 days later with having difficulty urinating, dysuria, and hallucinations.  He went to the ED ER on 01/25/2024 per PCP recommendation and was diagnosed with acute urinary retention and had a Foley placed.  He did follow-up with [facility] on 02/02/2024 and had Foley removed with voiding trial which he passed.  He states if he did not urinate by 3 PM that day after Foley removed to return to urologist.  He stated that he did not have any difficulty during the day with voiding, but at nighttime noticed a significant urinary urgency with difficulty being able to void only a small amount.  This has happened Tuesday night, Wednesday night.  He denies any difficulty during the daytime and is able to void every 2 hours with a moderate to large amount of urine.  He denies any fevers, chills, flank pain or back pain.  Denies any blood in his urine.  He has a history of BPH and is currently taking Flomax  0.4 mg daily and Ditropan  15 mg daily.  He has a history of CKD stage III and is currently on losartan  50 mg.  He had slight decrease in his GFR due to urinary retention and slight hyperkalemia.  Will recheck lab work today.  He has a follow-up with [facility] in  October.  The following portions of the patient's history were reviewed and updated as appropriate: past medical history, past surgical history, family history, social history, allergies, medications, and problem list.   Patient Active Problem List   Diagnosis Date Noted   Anemia due to chronic kidney disease 09/16/2023   Urinary tract infection without hematuria    Medication management 10/06/2018   Abnormal glucose 10/06/2018   Pulmonary nodules 07/02/2018   Prediabetes 04/01/2018   Chronic kidney disease, stage 3 (HCC) 06/11/2017   Overweight (BMI 25.0-29.9) 06/10/2017   Former smoker (54 pack year) 06/10/2017   Thoracic aorta atherosclerosis (HCC) by Chest CT scan in 2017 08/11/2016   COPD (chronic obstructive pulmonary disease) with chronic bronchitis (HCC) 09/07/2014   Generalized anxiety disorder 09/07/2014   Hyperlipidemia, mixed 06/08/2014   Gastroesophageal reflux disease 03/08/2014   OAB (overactive bladder) 03/08/2014   Diverticulosis 03/08/2014   Essential hypertension 12/05/2013   Vitamin D  deficiency 06/21/2013   Past Medical History:  Diagnosis Date   Acute appendicitis 11/24/2016   Chronic kidney disease 2017   COPD (chronic obstructive pulmonary disease) (HCC)    GERD (gastroesophageal reflux disease)    Hematuria    HLD (hyperlipidemia)    Hypertension    IBS (irritable bowel syndrome)    Pre-diabetes    Vitamin D  deficiency    Past Surgical History:  Procedure Laterality Date  CARPAL TUNNEL RELEASE Right    CATARACT EXTRACTION, BILATERAL Bilateral    Dr. Charmayne   CERVICAL FUSION     HAND SURGERY Left    LAPAROSCOPIC APPENDECTOMY N/A 11/24/2016   Procedure: APPENDECTOMY LAPAROSCOPIC;  Surgeon: Signe Mitzie LABOR, MD;  Location: [facility] OR;  Service: General;  Laterality: N/A;   TRIGGER FINGER RELEASE Right    thumb   Family History  Problem Relation Age of Onset   Irritable bowel syndrome Mother    Diverticulosis Mother    Lupus Mother    COPD Mother     Hypertension Mother    Lung cancer Father    Other Father        bladder polyps   Stroke Father    Kidney disease Father    Cancer Father    Colon cancer Neg Hx    Esophageal cancer Neg Hx    Rectal cancer Neg Hx    Stomach cancer Neg Hx    Outpatient Medications Prior to Visit  Medication Sig Dispense Refill   aspirin  EC 81 MG tablet Take 81 mg by mouth 2 (two) times a week. Swallow whole.     atorvastatin  (LIPITOR) 40 MG tablet TAKE 1 TABLET BY MOUTH DAILY FOR CHOLESTEROL 100 tablet 2   buPROPion  (WELLBUTRIN ) 75 MG tablet Take 1 tablet (75 mg total) by mouth daily. 45 tablet 0   buPROPion  ER (WELLBUTRIN  SR) 100 MG 12 hr tablet Take 1 tablet (100 mg total) by mouth daily. 60 tablet 0   Cholecalciferol  (VITAMIN D -3) 1000 UNITS CAPS Take 5,000 Units by mouth daily.      Fluticasone -Umeclidin-Vilant (TRELEGY ELLIPTA ) 100-62.5-25 MCG/ACT AEPB INHALE 1 INHALATION BY MOUTH  ONCE DAILY AT THE SAME TIME EACH DAY 120 each 2   furosemide  (LASIX ) 20 MG tablet TAKE 1 TABLET BY MOUTH EVERY  OTHER DAY 45 tablet 3   losartan  (COZAAR ) 50 MG tablet Take 1 tablet (50 mg total) by mouth daily. 90 tablet 3   oxybutynin  (DITROPAN  XL) 15 MG 24 hr tablet Take 15 mg by mouth daily.     tamsulosin  (FLOMAX ) 0.4 MG CAPS capsule Take 1 tablet at Bedtime for Prostate 90 capsule 3   No facility-administered medications prior to visit.   No Known Allergies   ROS: A complete ROS was performed with pertinent positives/negatives noted in the HPI. The remainder of the ROS are negative.    Objective:   Today's Vitals   02/04/24 1051  BP: 127/61  Pulse: 71  SpO2: 100%  Weight: 178 lb 12.8 oz (81.1 kg)  Height: 5' 9 (1.753 m)    Physical Exam   GENERAL: Well-appearing, in NAD. Well nourished.  SKIN: Pink, warm and dry.  Head: Normocephalic. NECK: Trachea midline. Full ROM w/o pain or tenderness.  RESPIRATORY: Chest wall symmetrical. Respirations even and non-labored.  GI: Abdomen soft, non-tender.  Normoactive bowel sounds. No rebound tenderness. No hepatomegaly or splenomegaly. No CVA tenderness.  MSK: Muscle tone and strength appropriate for age.  NEUROLOGIC: No motor or sensory deficits. Steady, even gait. C2-C12 intact.  PSYCH/MENTAL STATUS: Alert, oriented x 3. Cooperative, appropriate mood and affect.      Assessment & Plan:   1. Nocturia associated with benign prostatic hyperplasia (Primary) 2. BPH with obstruction/lower urinary tract symptoms Will obtain BMP and PSA with lab work today.  Urinalysis showed trace leuks, will send urine culture for observation of clearance of previous infection.  Recommend increasing his Flomax  to 0.8 mg and patient was agreeable.  If no improvement within 1 week or less, he will reach out to PCP and urologist. - PSA - Basic metabolic panel with GFR - Urine Culture - POCT URINALYSIS DIP (CLINITEK)   Meds ordered this encounter  Medications   DISCONTD: tamsulosin  (FLOMAX ) 0.4 MG CAPS capsule    Sig: Take 2 capsules (0.8 mg total) by mouth daily after supper.    Supervising Provider:   DE PERU, [name] J [[id number]]   tamsulosin  (FLOMAX ) 0.4 MG CAPS capsule    Sig: Take 2 capsules (0.8 mg total) by mouth daily after supper.    Dispense:  60 capsule    Refill:  3    Supervising Provider:   DE PERU, [name] J [[id number]]   Lab Orders         Urine Culture         PSA         Basic metabolic panel with GFR         POCT URINALYSIS DIP (CLINITEK)      Return in about 4 weeks (around 03/03/2024) for Follow up BPH .    Patient to reach out to office if new, worrisome, or unresolved symptoms arise or if no improvement in patient's condition. Patient verbalized understanding and is agreeable to treatment plan. All questions answered to patient's satisfaction.    Thersia Schuyler Stark, [place]

## 2024-02-05 ENCOUNTER — Ambulatory Visit (HOSPITAL_BASED_OUTPATIENT_CLINIC_OR_DEPARTMENT_OTHER): Payer: Self-pay | Admitting: Family Medicine

## 2024-02-05 LAB — BASIC METABOLIC PANEL WITH GFR
BUN/Creatinine Ratio: 10 (ref 10–24)
BUN: 15 mg/dL (ref 8–27)
CO2: 19 mmol/L — ABNORMAL LOW (ref 20–29)
Calcium: 9.2 mg/dL (ref 8.6–10.2)
Chloride: 94 mmol/L — ABNORMAL LOW (ref 96–106)
Creatinine, Ser: 1.57 mg/dL — ABNORMAL HIGH (ref 0.76–1.27)
Glucose: 84 mg/dL (ref 70–99)
Potassium: 4.7 mmol/L (ref 3.5–5.2)
Sodium: 129 mmol/L — ABNORMAL LOW (ref 134–144)
eGFR: 46 mL/min/1.73 — ABNORMAL LOW (ref >59)

## 2024-02-05 LAB — PSA: Prostate Specific Ag, Serum: 3.6 ng/mL (ref 0.0–4.0)

## 2024-02-05 NOTE — Progress Notes (Signed)
 Please schedule lab visit Monday for BMP only.   Hi Future,  Your kidney function has returned to baseline and your potassium has been corrected. However, your sodium has decreased. It is not to the level as prior, but I would like you to increase your electrolytes over the weekend (such as Gatorade, Powerade etc) to help. If you have been drinking a lot of plain water, this can sometimes dilute the level of salt in the body. I would like to keep a close eye on this and have you return for a lab check on Monday. Please let me know if this is possible. Additionally, if you begin experiencing symptoms such as dizziness or blurred vision or difficulty with urination, go to the ER.

## 2024-02-06 LAB — SPECIMEN STATUS REPORT

## 2024-02-06 LAB — URINE CULTURE: Organism ID, Bacteria: NO GROWTH

## 2024-02-07 ENCOUNTER — Other Ambulatory Visit (HOSPITAL_BASED_OUTPATIENT_CLINIC_OR_DEPARTMENT_OTHER): Payer: Self-pay | Admitting: Family Medicine

## 2024-02-07 DIAGNOSIS — E871 Hypo-osmolality and hyponatremia: Secondary | ICD-10-CM

## 2024-02-07 NOTE — Progress Notes (Signed)
 Urine culture indicates no growth of bacteria. No further antibiotic treatment needed.

## 2024-02-08 ENCOUNTER — Other Ambulatory Visit (HOSPITAL_BASED_OUTPATIENT_CLINIC_OR_DEPARTMENT_OTHER): Payer: Self-pay | Admitting: *Deleted

## 2024-02-08 DIAGNOSIS — E871 Hypo-osmolality and hyponatremia: Secondary | ICD-10-CM | POA: Diagnosis not present

## 2024-02-08 LAB — BASIC METABOLIC PANEL WITH GFR
BUN/Creatinine Ratio: 11 (ref 10–24)
BUN: 17 mg/dL (ref 8–27)
CO2: 20 mmol/L (ref 20–29)
Calcium: 8.9 mg/dL (ref 8.6–10.2)
Chloride: 100 mmol/L (ref 96–106)
Creatinine, Ser: 1.49 mg/dL — ABNORMAL HIGH (ref 0.76–1.27)
Glucose: 87 mg/dL (ref 70–99)
Potassium: 4.5 mmol/L (ref 3.5–5.2)
Sodium: 135 mmol/L (ref 134–144)
eGFR: 49 mL/min/1.73 — ABNORMAL LOW (ref >59)

## 2024-02-09 ENCOUNTER — Ambulatory Visit (HOSPITAL_BASED_OUTPATIENT_CLINIC_OR_DEPARTMENT_OTHER): Payer: Self-pay | Admitting: Family Medicine

## 2024-02-09 NOTE — Progress Notes (Signed)
 Hi [name] Park,  Your sodium has returned to baseline. Kidney function is stable. Please continue to monitor fluid intake with electrolytes often. We will follow up at your next scheduled appt.

## 2024-02-17 DIAGNOSIS — H26492 Other secondary cataract, left eye: Secondary | ICD-10-CM | POA: Diagnosis not present

## 2024-02-17 DIAGNOSIS — H31002 Unspecified chorioretinal scars, left eye: Secondary | ICD-10-CM | POA: Diagnosis not present

## 2024-02-17 DIAGNOSIS — Z961 Presence of intraocular lens: Secondary | ICD-10-CM | POA: Diagnosis not present

## 2024-03-02 ENCOUNTER — Encounter (HOSPITAL_BASED_OUTPATIENT_CLINIC_OR_DEPARTMENT_OTHER): Payer: Self-pay | Admitting: Family Medicine

## 2024-03-02 ENCOUNTER — Ambulatory Visit (INDEPENDENT_AMBULATORY_CARE_PROVIDER_SITE_OTHER): Admitting: Family Medicine

## 2024-03-02 ENCOUNTER — Other Ambulatory Visit (HOSPITAL_BASED_OUTPATIENT_CLINIC_OR_DEPARTMENT_OTHER): Payer: Self-pay

## 2024-03-02 VITALS — BP 112/64 | HR 72 | Ht 69.0 in | Wt 179.0 lb

## 2024-03-02 DIAGNOSIS — N401 Enlarged prostate with lower urinary tract symptoms: Secondary | ICD-10-CM

## 2024-03-02 DIAGNOSIS — L819 Disorder of pigmentation, unspecified: Secondary | ICD-10-CM

## 2024-03-02 DIAGNOSIS — R351 Nocturia: Secondary | ICD-10-CM

## 2024-03-02 MED ORDER — SHINGRIX 50 MCG/0.5ML IM SUSR
0.5000 mL | Freq: Once | INTRAMUSCULAR | 1 refills | Status: AC
  Filled 2024-03-02: qty 0.5, 1d supply, fill #0

## 2024-03-02 NOTE — Progress Notes (Signed)
 Subjective:   [name] Park September 21, 1950 03/02/2024  Chief Complaint  Patient presents with   Medical Management of Chronic Issues    4-week follow up; pt states he is doing much better since last visit and has not had any more urinary problems.    Discussed the use of AI scribe software for clinical note transcription with the patient, who gave verbal consent to proceed.  History of Present Illness      HPI: [name] Park presents today for re-assessment and management of obstruction and nocturia associated with BPH.  Patient was seen on 02/04/2024 following acute cystitis with hematuria in July.  Patient had difficulty with urination after starting antibiotics and had a Foley placed in ER 01/25/2024.  He has been seen by [facility] on 02/02/2024 and had a voiding trial which he passed.  He noticed after having Foley removed that he was still having significant urinary urgency and difficulty with starting stream only at nighttime.  He was seen by PCP on 02/04/2024 and had increase of his Flomax  to 0.8 mg daily and continue his Ditropan  15 mg daily.  He has a history of CKD stage III.  He had mild hyponatremia and was recommended to increase electrolytes with resolution.  He states he has a follow-up with [facility] on October 1.  He is currently drinking 1-2 bottles of Gatorade per day (approximately 16 to 20 ounces per bottle) and 1 bottle of water per day.  He has noticed some mild swelling in his lower extremities and puffiness around his face with this.  He states since starting the 0.8 mg of Flomax  he has had significant improvement and resolution of his BPH symptoms.  He is only getting up to use the restroom once at nighttime and has no difficulty with starting stream.  Denies dysuria, urgency, or frequency.  Patient does report a concerning skin lesion to his left shoulder that he would like PCP to look as well today.  The following portions of the patient's  history were reviewed and updated as appropriate: past medical history, past surgical history, family history, social history, allergies, medications, and problem list.   Patient Active Problem List   Diagnosis Date Noted   Anemia due to chronic kidney disease 09/16/2023   Urinary tract infection without hematuria    Medication management 10/06/2018   Abnormal glucose 10/06/2018   Pulmonary nodules 07/02/2018   Prediabetes 04/01/2018   Chronic kidney disease, stage 3 (HCC) 06/11/2017   Overweight (BMI 25.0-29.9) 06/10/2017   Former smoker (54 pack year) 06/10/2017   Thoracic aorta atherosclerosis (HCC) by Chest CT scan in 2017 08/11/2016   COPD (chronic obstructive pulmonary disease) with chronic bronchitis (HCC) 09/07/2014   Generalized anxiety disorder 09/07/2014   Hyperlipidemia, mixed 06/08/2014   Gastroesophageal reflux disease 03/08/2014   OAB (overactive bladder) 03/08/2014   Diverticulosis 03/08/2014   Essential hypertension 12/05/2013   Vitamin D  deficiency 06/21/2013   Past Medical History:  Diagnosis Date   Acute appendicitis 11/24/2016   Chronic kidney disease 2017   COPD (chronic obstructive pulmonary disease) (HCC)    GERD (gastroesophageal reflux disease)    Hematuria    HLD (hyperlipidemia)    Hypertension    IBS (irritable bowel syndrome)    Pre-diabetes    Vitamin D  deficiency    Past Surgical History:  Procedure Laterality Date   CARPAL TUNNEL RELEASE Right    CATARACT EXTRACTION, BILATERAL Bilateral    Dr. Charmayne   CERVICAL  FUSION     HAND SURGERY Left    LAPAROSCOPIC APPENDECTOMY N/A 11/24/2016   Procedure: APPENDECTOMY LAPAROSCOPIC;  Surgeon: Signe Mitzie LABOR, MD;  Location: [facility] OR;  Service: General;  Laterality: N/A;   TRIGGER FINGER RELEASE Right    thumb   Family History  Problem Relation Age of Onset   Irritable bowel syndrome Mother    Diverticulosis Mother    Lupus Mother    COPD Mother    Hypertension Mother    Lung cancer Father     Other Father        bladder polyps   Stroke Father    Kidney disease Father    Cancer Father    Colon cancer Neg Hx    Esophageal cancer Neg Hx    Rectal cancer Neg Hx    Stomach cancer Neg Hx    Outpatient Medications Prior to Visit  Medication Sig Dispense Refill   aspirin  EC 81 MG tablet Take 81 mg by mouth 2 (two) times a week. Swallow whole.     atorvastatin  (LIPITOR) 40 MG tablet TAKE 1 TABLET BY MOUTH DAILY FOR CHOLESTEROL 100 tablet 2   buPROPion  (WELLBUTRIN ) 75 MG tablet Take 1 tablet (75 mg total) by mouth daily. 45 tablet 0   buPROPion  ER (WELLBUTRIN  SR) 100 MG 12 hr tablet Take 1 tablet (100 mg total) by mouth daily. 60 tablet 0   Cholecalciferol  (VITAMIN D -3) 1000 UNITS CAPS Take 5,000 Units by mouth daily.      Fluticasone -Umeclidin-Vilant (TRELEGY ELLIPTA ) 100-62.5-25 MCG/ACT AEPB INHALE 1 INHALATION BY MOUTH  ONCE DAILY AT THE SAME TIME EACH DAY 120 each 2   furosemide  (LASIX ) 20 MG tablet TAKE 1 TABLET BY MOUTH EVERY  OTHER DAY 45 tablet 3   losartan  (COZAAR ) 50 MG tablet Take 1 tablet (50 mg total) by mouth daily. 90 tablet 3   oxybutynin  (DITROPAN  XL) 15 MG 24 hr tablet Take 15 mg by mouth daily.     tamsulosin  (FLOMAX ) 0.4 MG CAPS capsule Take 2 capsules (0.8 mg total) by mouth daily after supper. 60 capsule 3   No facility-administered medications prior to visit.   No Known Allergies   ROS: A complete ROS was performed with pertinent positives/negatives noted in the HPI. The remainder of the ROS are negative.    Objective:   Today's Vitals   03/02/24 1354  BP: 112/64  Pulse: 72  SpO2: 99%  Weight: 179 lb (81.2 kg)  Height: 5' 9 (1.753 m)    Physical Exam   GENERAL: Well-appearing, in NAD. Well nourished.  SKIN: Pink, warm and dry. No rash, , ulceration, or ecchymoses.  0.4 mm pigmented lesion with dark pigment center present to left shoulder with mild flaking.  No bleeding, drainage or redness. Head: Normocephalic. NECK: Trachea midline. Full ROM  w/o pain or tenderness. RESPIRATORY: Chest wall symmetrical. Respirations even and non-labored.  MSK: Muscle tone and strength appropriate for age.  EXTREMITIES: Without clubbing, cyanosis.  Mild +1 nonpitting edema to bilateral lower extremities.SABRA  NEUROLOGIC: No motor or sensory deficits. Steady, even gait. C2-C12 intact.  PSYCH/MENTAL STATUS: Alert, oriented x 3. Cooperative, appropriate mood and affect.     Assessment & Plan:  1. Pigmented skin lesion suspicious for malignant neoplasm (Primary) Patient to return for skin shave biopsy of suspicious lesion in the next 1 to 2 months.  2. Nocturia associated with benign prostatic hyperplasia Symptoms have improved and BPH currently well-controlled.  Will continue with Flomax  0.8 mg a daily  and Ditropan  as prescribed.  He will follow-up with [facility] as scheduled and return to PCP if symptoms worsen or return  Return for Make appt for Skin Shave Biopsy; 4 months for AE/Chronic follow up .    Patient to reach out to office if new, worrisome, or unresolved symptoms arise or if no improvement in patient's condition. Patient verbalized understanding and is agreeable to treatment plan. All questions answered to patient's satisfaction.    Thersia Schuyler Stark, [place]

## 2024-03-22 ENCOUNTER — Ambulatory Visit: Admitting: Pulmonary Disease

## 2024-03-22 ENCOUNTER — Encounter: Payer: Self-pay | Admitting: Pulmonary Disease

## 2024-03-22 ENCOUNTER — Other Ambulatory Visit (HOSPITAL_BASED_OUTPATIENT_CLINIC_OR_DEPARTMENT_OTHER): Payer: Self-pay

## 2024-03-22 VITALS — BP 145/75 | HR 65 | Ht 69.0 in | Wt 180.0 lb

## 2024-03-22 DIAGNOSIS — J984 Other disorders of lung: Secondary | ICD-10-CM | POA: Diagnosis not present

## 2024-03-22 MED ORDER — ALBUTEROL SULFATE HFA 108 (90 BASE) MCG/ACT IN AERS
2.0000 | INHALATION_SPRAY | Freq: Four times a day (QID) | RESPIRATORY_TRACT | 3 refills | Status: AC | PRN
  Filled 2024-03-22: qty 6.7, 25d supply, fill #0

## 2024-03-22 MED ORDER — TRELEGY ELLIPTA 100-62.5-25 MCG/ACT IN AEPB: 1.0000 | INHALATION_SPRAY | Freq: Every day | RESPIRATORY_TRACT | 3 refills | Status: AC

## 2024-03-22 NOTE — Patient Instructions (Signed)
 Continue trelegy ellipta  1 puff daily  Use albuterol  inhaler 1-2 puffs every 4-6 hours as needed  Follow up in 1 year with pulmonary function tests

## 2024-03-22 NOTE — Progress Notes (Unsigned)
 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   He continues to use Trelegy. Occasional dry cough and wheezing occur, especially when lying in bed. He does not use albuterol  at home. Last week, he experienced shortness of breath and weakness while working outside in 85-degree weather, requiring a 20-30 minute rest. He has not had similar episodes since. He notes leg swelling and takes a diuretic every other day.  OV 03/18/23 He reports adherence to his prescribed Trelegy inhaler and has noticed a significant reduction in wheezing since starting the medication. He recently completed a pulmonary rehabilitation program, which he found beneficial and enjoyable. Since completing the program, he has been maintaining physical activity by walking for approximately 30 minutes, covering a little over a mile, several times a week. He is considering joining a gym and continuing a structured exercise program, but is unsure about the cost and insurance coverage.  OV 09/23/22 He noted improvement in his breathing with Trelegy 1 puff daily and has continued on this. He has not had any more wheezing. Has not noticed much improvement in regards to exertional dyspnea. He remains very sedentary throughout the day.  HRCT Chest shows no changes in bibasilar scarring since 11/23/2018. There is mild lobular air trapping.   ONO showed he did not qualify for nocturnal oxygen therapy.   Initial OV 08/06/22 PFTs show mild restriction and mild diffusion defect.   He reports exertional  dyspnea, cough that is occasionally productive and wheezing since the fall. He tried breztri inhaler 2 puffs twice daily for a couple weeks with out improvement in his symptoms. He reports a fairly sedentary life. He does yard work during the spring, summer and fall. He denies joint pains or rashes.  He quit smoking in 2005. He smoked 30+ years, 1-2 packs. His mother had emphysema and lupus, she was a never smoker. He had second hand smoke exposure in childhood from his father. He retired from the [employer], 28 years. No pets at home. No water or mold damage at home.   Past Medical History:  Diagnosis Date   Acute appendicitis 11/24/2016   Chronic kidney disease 2017   COPD (chronic obstructive pulmonary disease) (HCC)    GERD (gastroesophageal reflux disease)    Hematuria    HLD (hyperlipidemia)    Hypertension    IBS (irritable bowel syndrome)    Pre-diabetes    Vitamin D  deficiency      Family History  Problem Relation Age of Onset   Irritable bowel syndrome Mother    Diverticulosis Mother    Lupus Mother    COPD Mother    Hypertension Mother    Lung cancer Father    Other Father        bladder polyps   Stroke Father    Kidney disease Father    Cancer Father    Colon cancer Neg Hx    Esophageal cancer Neg Hx    Rectal cancer Neg Hx    Stomach cancer Neg Hx  Social History   Socioeconomic History   Marital status: Married    Spouse name: Abdoulaye Drum   Number of children: 1   Years of education: 12   Highest education level: 12th grade  Occupational History   Occupation: retired  Tobacco Use   Smoking status: Former    Current packs/day: 0.00    Average packs/day: 1.5 packs/day for 35.0 years (52.5 ttl pk-yrs)    Types: Cigarettes    Start date: 02/04/1969    Quit date: 02/05/2004    Years since quitting: 20.1    Passive exposure: Past   Smokeless tobacco: Never  Vaping Use   Vaping status: Never Used  Substance and Sexual Activity   Alcohol use:  Yes    Comment: seldom   Drug use: No   Sexual activity: Not on file  Other Topics Concern   Not on file  Social History Narrative   Not on file   Social Drivers of Health   Financial Resource Strain: Low Risk  (12/19/2023)   Overall Financial Resource Strain (CARDIA)    Difficulty of Paying Living Expenses: Not hard at all  Food Insecurity: No Food Insecurity (12/19/2023)   Hunger Vital Sign    Worried About Running Out of Food in the Last Year: Never true    Ran Out of Food in the Last Year: Never true  Transportation Needs: No Transportation Needs (12/19/2023)   PRAPARE - Administrator, Civil Service (Medical): No    Lack of Transportation (Non-Medical): No  Physical Activity: Insufficiently Active (12/19/2023)   Exercise Vital Sign    Days of Exercise per Week: 2 days    Minutes of Exercise per Session: 30 min  Stress: No Stress Concern Present (12/19/2023)   [employer] of Occupational Health - Occupational Stress Questionnaire    Feeling of Stress: Only a little  Social Connections: Moderately Isolated (12/19/2023)   Social Connection and Isolation Panel    Frequency of Communication with Friends and Family: Three times a week    Frequency of Social Gatherings with Friends and Family: Once a week    Attends Religious Services: Patient declined    Database administrator or Organizations: No    Attends Engineer, structural: Not on file    Marital Status: Married  Catering manager Violence: Not At Risk (11/30/2023)   Humiliation, Afraid, Rape, and Kick questionnaire    Fear of Current or Ex-Partner: No    Emotionally Abused: No    Physically Abused: No    Sexually Abused: No     No Known Allergies   Outpatient Medications Prior to Visit  Medication Sig Dispense Refill   aspirin  EC 81 MG tablet Take 81 mg by mouth 2 (two) times a week. Swallow whole.     atorvastatin  (LIPITOR) 40 MG tablet TAKE 1 TABLET BY MOUTH DAILY FOR CHOLESTEROL 100 tablet 2    buPROPion  (WELLBUTRIN ) 75 MG tablet Take 1 tablet (75 mg total) by mouth daily. 45 tablet 0   buPROPion  ER (WELLBUTRIN  SR) 100 MG 12 hr tablet Take 1 tablet (100 mg total) by mouth daily. 60 tablet 0   Cholecalciferol  (VITAMIN D -3) 1000 UNITS CAPS Take 5,000 Units by mouth daily.      Fluticasone -Umeclidin-Vilant (TRELEGY ELLIPTA ) 100-62.5-25 MCG/ACT AEPB INHALE 1 INHALATION BY MOUTH  ONCE DAILY AT THE SAME TIME EACH DAY 120 each 2   furosemide  (LASIX ) 20 MG tablet TAKE 1 TABLET BY MOUTH EVERY  OTHER DAY 45  tablet 3   losartan  (COZAAR ) 50 MG tablet Take 1 tablet (50 mg total) by mouth daily. 90 tablet 3   oxybutynin  (DITROPAN  XL) 15 MG 24 hr tablet Take 15 mg by mouth daily.     tamsulosin  (FLOMAX ) 0.4 MG CAPS capsule Take 2 capsules (0.8 mg total) by mouth daily after supper. 60 capsule 3   No facility-administered medications prior to visit.   Review of Systems  Constitutional:  Negative for chills, fever, malaise/fatigue and weight loss.  HENT:  Negative for congestion, sinus pain and sore throat.   Eyes: Negative.   Respiratory:  Positive for shortness of breath. Negative for cough, hemoptysis, sputum production and wheezing.   Cardiovascular:  Negative for chest pain, palpitations, orthopnea, claudication and leg swelling.  Gastrointestinal:  Negative for abdominal pain, heartburn, nausea and vomiting.  Genitourinary: Negative.   Musculoskeletal:  Negative for joint pain and myalgias.  Skin:  Negative for rash.  Neurological:  Negative for weakness.  Endo/Heme/Allergies: Negative.    Objective:   Vitals:   03/22/24 1304  BP: (!) 145/75  Pulse: 65  SpO2: 97%  Weight: 180 lb (81.6 kg)  Height: 5' 9 (1.753 m)    Physical Exam Constitutional:      General: He is not in acute distress. HENT:     Head: Normocephalic and atraumatic.  Eyes:     Conjunctiva/sclera: Conjunctivae normal.  Cardiovascular:     Rate and Rhythm: Normal rate and regular rhythm.     Pulses: Normal  pulses.     Heart sounds: Normal heart sounds. No murmur heard. Pulmonary:     Effort: Pulmonary effort is normal.     Breath sounds: Normal breath sounds.  Musculoskeletal:     Right lower leg: No edema.     Left lower leg: No edema.  Skin:    General: Skin is warm and dry.  Neurological:     General: No focal deficit present.     Mental Status: He is alert.    CBC    Component Value Date/Time   WBC 8.0 01/25/2024 1427   RBC 3.63 (L) 01/25/2024 1427   HGB 11.2 (L) 01/25/2024 1427   HGB 13.7 09/16/2023 1433   HCT 31.8 (L) 01/25/2024 1427   HCT 40.9 09/16/2023 1433   PLT 249 01/25/2024 1427   PLT 292 09/16/2023 1433   MCV 87.6 01/25/2024 1427   MCV 91 09/16/2023 1433   MCH 30.9 01/25/2024 1427   MCHC 35.2 01/25/2024 1427   RDW 12.9 01/25/2024 1427   RDW 12.6 09/16/2023 1433   LYMPHSABS 1.2 01/25/2024 1427   LYMPHSABS 1.5 09/16/2023 1433   MONOABS 1.1 (H) 01/25/2024 1427   EOSABS 0.1 01/25/2024 1427   EOSABS 0.3 09/16/2023 1433   BASOSABS 0.0 01/25/2024 1427   BASOSABS 0.1 09/16/2023 1433      Latest Ref Rng & Units 02/08/2024   10:57 AM 02/04/2024   12:00 PM 01/29/2024   11:04 AM  BMP  Glucose 70 - 99 mg/dL 87  84  880   BUN 8 - 27 mg/dL 17  15  16    Creatinine 0.76 - 1.27 mg/dL 8.50  8.42  8.15   BUN/Creat Ratio 10 - 24 11  10  9    Sodium 134 - 144 mmol/L 135  129  137   Potassium 3.5 - 5.2 mmol/L 4.5  4.7  5.5   Chloride 96 - 106 mmol/L 100  94  100   CO2 20 - 29 mmol/L  20  19  20    Calcium  8.6 - 10.2 mg/dL 8.9  9.2  9.2    Chest imaging: HRCT Chest 08/25/22 1. Bland appearing bibasilar scarring or atelectasis, unchanged compared to prior examination dated 11/23/2018. No evidence of fibrotic interstitial lung disease. 2. Mild lobular air trapping on expiratory phase imaging, suggestive of small airways disease. 3. Coronary artery disease.  CTA Coronary Scan 07/01/22 Scattered pulmonary parenchymal scarring. Small hiatal hernia. Calcified  granulomas.  PFT:    Latest Ref Rng & Units 01/14/2024   10:39 AM 07/17/2022   12:20 PM  PFT Results  FVC-Pre L 2.97  2.73   FVC-Predicted Pre % 72  65   FVC-Post L 3.06  2.74   FVC-Predicted Post % 74  65   Pre FEV1/FVC % % 81  77   Post FEV1/FCV % % 79  79   FEV1-Pre L 2.40  2.12   FEV1-Predicted Pre % 80  70   FEV1-Post L 2.41  2.17   DLCO uncorrected ml/min/mmHg 20.19  17.75   DLCO UNC% % 82  71   DLCO corrected ml/min/mmHg 20.19    DLCO COR %Predicted % 82    DLVA Predicted % 107  103   TLC L 4.61  4.97   TLC % Predicted % 67  72   RV % Predicted % 58  91     Labs:  Path:  Echo 05/2022: LV EF 60-65%. RV systolic function and size are normal.   Heart Catheterization:     Assessment & Plan:   Restrictive lung disease  Discussion: [name] Park is a 73 year old male, former smoker with history of GERD, HLD, and hypertension who returns to pulmonary clinic for restrictive defect on PFTs   He has mild restriction and diffusion defect on PFTs.  He is to continue on trelegy ellipta  1 puff daily.   Follow up in 1 year with PFTs.  Dorn Chill, MD [facility] Office: [phone]     Current Outpatient Medications:    aspirin  EC 81 MG tablet, Take 81 mg by mouth 2 (two) times a week. Swallow whole., Disp: , Rfl:    atorvastatin  (LIPITOR) 40 MG tablet, TAKE 1 TABLET BY MOUTH DAILY FOR CHOLESTEROL, Disp: 100 tablet, Rfl: 2   buPROPion  (WELLBUTRIN ) 75 MG tablet, Take 1 tablet (75 mg total) by mouth daily., Disp: 45 tablet, Rfl: 0   buPROPion  ER (WELLBUTRIN  SR) 100 MG 12 hr tablet, Take 1 tablet (100 mg total) by mouth daily., Disp: 60 tablet, Rfl: 0   Cholecalciferol  (VITAMIN D -3) 1000 UNITS CAPS, Take 5,000 Units by mouth daily. , Disp: , Rfl:    Fluticasone -Umeclidin-Vilant (TRELEGY ELLIPTA ) 100-62.5-25 MCG/ACT AEPB, INHALE 1 INHALATION BY MOUTH  ONCE DAILY AT THE SAME TIME EACH DAY, Disp: 120 each, Rfl: 2   furosemide  (LASIX ) 20 MG  tablet, TAKE 1 TABLET BY MOUTH EVERY  OTHER DAY, Disp: 45 tablet, Rfl: 3   losartan  (COZAAR ) 50 MG tablet, Take 1 tablet (50 mg total) by mouth daily., Disp: 90 tablet, Rfl: 3   oxybutynin  (DITROPAN  XL) 15 MG 24 hr tablet, Take 15 mg by mouth daily., Disp: , Rfl:    tamsulosin  (FLOMAX ) 0.4 MG CAPS capsule, Take 2 capsules (0.8 mg total) by mouth daily after supper., Disp: 60 capsule, Rfl: 3

## 2024-03-23 DIAGNOSIS — R3915 Urgency of urination: Secondary | ICD-10-CM | POA: Diagnosis not present

## 2024-03-23 DIAGNOSIS — R3912 Poor urinary stream: Secondary | ICD-10-CM | POA: Diagnosis not present

## 2024-03-23 DIAGNOSIS — N3281 Overactive bladder: Secondary | ICD-10-CM | POA: Diagnosis not present

## 2024-03-23 DIAGNOSIS — R350 Frequency of micturition: Secondary | ICD-10-CM | POA: Diagnosis not present

## 2024-03-25 ENCOUNTER — Encounter: Payer: Self-pay | Admitting: Pulmonary Disease

## 2024-03-30 ENCOUNTER — Other Ambulatory Visit (HOSPITAL_BASED_OUTPATIENT_CLINIC_OR_DEPARTMENT_OTHER): Payer: Self-pay

## 2024-03-30 MED ORDER — FLUZONE HIGH-DOSE 0.5 ML IM SUSY
0.5000 mL | PREFILLED_SYRINGE | Freq: Once | INTRAMUSCULAR | 0 refills | Status: AC
  Filled 2024-03-30: qty 0.5, 1d supply, fill #0

## 2024-04-05 ENCOUNTER — Ambulatory Visit (INDEPENDENT_AMBULATORY_CARE_PROVIDER_SITE_OTHER): Admitting: Family Medicine

## 2024-04-05 ENCOUNTER — Encounter (HOSPITAL_BASED_OUTPATIENT_CLINIC_OR_DEPARTMENT_OTHER): Payer: Self-pay | Admitting: Family Medicine

## 2024-04-05 VITALS — BP 130/55 | HR 88 | Ht 69.0 in | Wt 183.8 lb

## 2024-04-05 DIAGNOSIS — L819 Disorder of pigmentation, unspecified: Secondary | ICD-10-CM

## 2024-04-05 DIAGNOSIS — L820 Inflamed seborrheic keratosis: Secondary | ICD-10-CM | POA: Diagnosis not present

## 2024-04-05 DIAGNOSIS — L821 Other seborrheic keratosis: Secondary | ICD-10-CM | POA: Diagnosis not present

## 2024-04-05 NOTE — Progress Notes (Signed)
 Subjective:   [name] Park [birth year] 04/05/2024  Chief Complaint  Patient presents with   Biopsy    Patient is here today to have skin shave biopsy performed. States that the original mole he showed was not the correct one that he had meant to show as correct mole is on back of neck on right side. Also states he has one on abdomen that he wants to have looked at to see if it might need to be removed.     HPI: [name] Park presents today for procedure of skin shave biopsy of two suspicious lesions to his right shoulder/neck area and to his left abdomen. Patient states both areas have become larger in size and changed pigmentation. They are becoming flaky.   The following portions of the patient's history were reviewed and updated as appropriate: past medical history, past surgical history, family history, social history, allergies, medications, and problem list.   Patient Active Problem List   Diagnosis Date Noted   Anemia due to chronic kidney disease 09/16/2023   Urinary tract infection without hematuria    Medication management 10/06/2018   Abnormal glucose 10/06/2018   Pulmonary nodules 07/02/2018   Prediabetes 04/01/2018   Chronic kidney disease, stage 3 (HCC) 06/11/2017   Overweight (BMI 25.0-29.9) 06/10/2017   Former smoker (54 pack year) 06/10/2017   Thoracic aorta atherosclerosis (HCC) by Chest CT scan in 2017 08/11/2016   COPD (chronic obstructive pulmonary disease) with chronic bronchitis (HCC) 09/07/2014   Generalized anxiety disorder 09/07/2014   Hyperlipidemia, mixed 06/08/2014   Gastroesophageal reflux disease 03/08/2014   OAB (overactive bladder) 03/08/2014   Diverticulosis 03/08/2014   Essential hypertension 12/05/2013   Vitamin D  deficiency 06/21/2013   Past Medical History:  Diagnosis Date   Acute appendicitis 11/24/2016   Chronic kidney disease 2017   COPD (chronic obstructive pulmonary disease) (HCC)    GERD (gastroesophageal reflux  disease)    Hematuria    HLD (hyperlipidemia)    Hypertension 2016   IBS (irritable bowel syndrome)    Pre-diabetes    Vitamin D  deficiency    Past Surgical History:  Procedure Laterality Date   CARPAL TUNNEL RELEASE Right    CATARACT EXTRACTION, BILATERAL Bilateral    Dr. Charmayne   CERVICAL FUSION     HAND SURGERY Left    LAPAROSCOPIC APPENDECTOMY N/A 11/24/2016   Procedure: APPENDECTOMY LAPAROSCOPIC;  Surgeon: Signe Mitzie LABOR, MD;  Location: [facility] OR;  Service: General;  Laterality: N/A;   TRIGGER FINGER RELEASE Right    thumb   Family History  Problem Relation Age of Onset   Irritable bowel syndrome Mother    Diverticulosis Mother    Lupus Mother    COPD Mother    Hypertension Mother    Lung cancer Father    Other Father        bladder polyps   Stroke Father    Kidney disease Father    Cancer Father    Colon cancer Neg Hx    Esophageal cancer Neg Hx    Rectal cancer Neg Hx    Stomach cancer Neg Hx    Outpatient Medications Prior to Visit  Medication Sig Dispense Refill   albuterol  (VENTOLIN  HFA) 108 (90 Base) MCG/ACT inhaler Inhale 2 puffs into the lungs every 6 (six) hours as needed for wheezing or shortness of breath. 6.7 g 3   aspirin  EC 81 MG tablet Take 81 mg by mouth 2 (two) times a week. Swallow whole.  atorvastatin  (LIPITOR) 40 MG tablet TAKE 1 TABLET BY MOUTH DAILY FOR CHOLESTEROL 100 tablet 2   buPROPion  (WELLBUTRIN ) 75 MG tablet Take 1 tablet (75 mg total) by mouth daily. 45 tablet 0   Cholecalciferol  (VITAMIN D -3) 1000 UNITS CAPS Take 5,000 Units by mouth daily.      Fluticasone -Umeclidin-Vilant (TRELEGY ELLIPTA ) 100-62.5-25 MCG/ACT AEPB Inhale 1 puff into the lungs daily. 90 each 3   furosemide  (LASIX ) 20 MG tablet TAKE 1 TABLET BY MOUTH EVERY  OTHER DAY 45 tablet 3   losartan  (COZAAR ) 50 MG tablet Take 1 tablet (50 mg total) by mouth daily. 90 tablet 3   oxybutynin  (DITROPAN  XL) 15 MG 24 hr tablet Take 15 mg by mouth daily.     tamsulosin  (FLOMAX ) 0.4  MG CAPS capsule Take 2 capsules (0.8 mg total) by mouth daily after supper. 60 capsule 3   buPROPion  ER (WELLBUTRIN  SR) 100 MG 12 hr tablet Take 1 tablet (100 mg total) by mouth daily. 60 tablet 0   No facility-administered medications prior to visit.   No Known Allergies   ROS: A complete ROS was performed with pertinent positives/negatives noted in the HPI. The remainder of the ROS are negative.    Objective:   Today's Vitals   04/05/24 1056  BP: (!) 130/55  Pulse: 88  SpO2: 99%  Weight: 183 lb 12.8 oz (83.4 kg)  Height: 5' 9 (1.753 m)    Physical Exam   GENERAL: Well-appearing, in NAD. Well nourished.  SKIN: Pink, warm and dry. Pigmented suspicious nevus present to left abdomen approx. 10mm with flaking and asymmetry of borders. Pigmented suspicious nevus present with asymmetry approx. 5mm to right shoulder area.  Head: Normocephalic. NECK: Trachea midline. Full ROM w/o pain or tenderness.  RESPIRATORY: Chest wall symmetrical. Respirations even and non-labored.  MSK: Muscle tone and strength appropriate for age.  NEUROLOGIC: No motor or sensory deficits. Steady, even gait. C2-C12 intact.  PSYCH/MENTAL STATUS: Alert, oriented x 3. Cooperative, appropriate mood and affect.   Skin excision  Date/Time: 04/05/2024 12:01 PM  Performed by: Knute Thersia Bitters, FNP Authorized by: Knute Thersia Bitters, FNP   Number of Lesions: 2 Lesion 1:    Body area: trunk   Trunk location: abdomen (Left Abdomen)   Initial size (mm): 10   Final defect size (mm): 10   Malignancy: malignancy unknown     Destruction method comment: Curretage and Electrosurgery   Repair comments: PROCEDURE: Shave Biopsy Performing Provider: Thersia Knute, FNP-C Informed verbal and written consent obtained after discussion of risks (including but not limited to pain, infection, bleeding, damage to surrounding tissues, incomplete evacuation/treatment of infection, recurrence)  and benefits (adequate  treatment and diagnosis, relief of pain). All questions answered prior to procedure.  A timeout protocol was performed prior to initiating the procedure.   The area was cleaned with alcohol and injected with 0.5 mL of 2% lidocaine  with epinephrine . Area was prepped with betadine.  A Dermablade was slightly bent and used to cut under the area of interest. The specimen was placed in a sterile specimen cup and sent to the lab. The area was then cauterized ensuring adequate hemostasis. The area was dressed with triple antibiotic ointment and a bandage. There were no complications noted. The patient tolerated the procedure well.  Follow-up: The patient tolerated the procedure well without complications. Standard post-procedure care is explained and return precautions are given, patient was provided with printed information & instructions.   Lesion 2:    Body area:  upper extremity   Upper extremity location: R shoulder   Initial size (mm): 5   Final defect size (mm): 5   Malignancy: malignancy unknown     Destruction method comment: Curretage and Electrocautery   Repair comments: PROCEDURE: Shave Biopsy Performing Provider: Thersia Stark, FNP-C Informed verbal and written consent obtained after discussion of risks (including but not limited to pain, infection, bleeding, damage to surrounding tissues, incomplete evacuation/treatment of infection, recurrence)  and benefits (adequate treatment and diagnosis, relief of pain). All questions answered prior to procedure.  A timeout protocol was performed prior to initiating the procedure.   The area was cleaned with alcohol and injected with 0.25 mL of 2% lidocaine  with epinephrine . Area was prepped with betadine.  A Dermablade was slightly bent and used to cut under the area of interest. The specimen was placed in a sterile specimen cup and sent to the lab. The area was then cauterized ensuring adequate hemostasis. The area was dressed with triple  antibiotic ointment and a bandage. There were no complications noted. The patient tolerated the procedure well.  Follow-up: The patient tolerated the procedure well without complications. Standard post-procedure care is explained and return precautions are given, patient was provided with printed information & instructions.       Assessment & Plan:  1. Pigmented skin lesion suspicious for malignant neoplasm (Primary) Patient tolerated shave biopsies well. Samples sent to [facility] for pathology. Wound care reviewed with patient. Follow up in 1-2 weeks for skin check.  - Pathology ([facility]) - Pathology ([facility]) - Skin excision   No orders of the defined types were placed in this encounter.  Lab Orders  No laboratory test(s) ordered today   No images are attached to the encounter or orders placed in the encounter.  Return for 1-2 weeks wound check up .    Patient to reach out to office if new, worrisome, or unresolved symptoms arise or if no improvement in patient's condition. Patient verbalized understanding and is agreeable to treatment plan. All questions answered to patient's satisfaction.    Thersia Schuyler Stark, [place]

## 2024-04-05 NOTE — Patient Instructions (Signed)
 Wound Care: Keep dry for 24 hours. Apply bacitracin twice daily for the next 3 days.   Call PCP if redness, irritation, bleeding, drainage, pain or swelling occurs.

## 2024-04-07 LAB — ANATOMIC PATHOLOGY REPORT

## 2024-04-08 ENCOUNTER — Ambulatory Visit (HOSPITAL_BASED_OUTPATIENT_CLINIC_OR_DEPARTMENT_OTHER): Payer: Self-pay | Admitting: Family Medicine

## 2024-04-08 NOTE — Progress Notes (Signed)
 Hi [name] Park, Your skin biopsies both came back as benign seborrheic keratoses.  These are benign skin growths that can sometimes appear as pigmented lesions and require biopsy to rule out.  No further management is needed at this time.  These can return so if they do and we need them taking care of we can always use the cauterization tool or can freeze these in the future.  If you have any questions please let me know

## 2024-04-12 ENCOUNTER — Ambulatory Visit (INDEPENDENT_AMBULATORY_CARE_PROVIDER_SITE_OTHER): Admitting: Family Medicine

## 2024-04-12 ENCOUNTER — Encounter (HOSPITAL_BASED_OUTPATIENT_CLINIC_OR_DEPARTMENT_OTHER): Payer: Self-pay | Admitting: Family Medicine

## 2024-04-12 VITALS — BP 124/59 | HR 82 | Ht 69.0 in | Wt 183.8 lb

## 2024-04-12 DIAGNOSIS — Z5189 Encounter for other specified aftercare: Secondary | ICD-10-CM

## 2024-04-12 NOTE — Progress Notes (Signed)
 Subjective:   [name] Park August 04, 1950 04/12/2024  Chief Complaint  Patient presents with   Wound Check    Patient is here today for a follow up after recent skin biopsies. States places are healing up well and denies any concerns for today.      HPI: [name] Park presents today for re-assessment of site where shave biopsy was performed.  Area to right shoulder and area to left lower trunk are healing well per patient.  He denies any redness, drainage, bleeding, or signs of infection.  Both biopsies were performed and pathology was benign.   The following portions of the patient's history were reviewed and updated as appropriate: past medical history, past surgical history, family history, social history, allergies, medications, and problem list.   Patient Active Problem List   Diagnosis Date Noted   Anemia due to chronic kidney disease 09/16/2023   Urinary tract infection without hematuria    Medication management 10/06/2018   Abnormal glucose 10/06/2018   Pulmonary nodules 07/02/2018   Prediabetes 04/01/2018   Chronic kidney disease, stage 3 (HCC) 06/11/2017   Overweight (BMI 25.0-29.9) 06/10/2017   Former smoker (54 pack year) 06/10/2017   Thoracic aorta atherosclerosis (HCC) by Chest CT scan in 2017 08/11/2016   COPD (chronic obstructive pulmonary disease) with chronic bronchitis (HCC) 09/07/2014   Generalized anxiety disorder 09/07/2014   Hyperlipidemia, mixed 06/08/2014   Gastroesophageal reflux disease 03/08/2014   OAB (overactive bladder) 03/08/2014   Diverticulosis 03/08/2014   Essential hypertension 12/05/2013   Vitamin D  deficiency 06/21/2013   Past Medical History:  Diagnosis Date   Acute appendicitis 11/24/2016   Chronic kidney disease 2017   COPD (chronic obstructive pulmonary disease) (HCC)    GERD (gastroesophageal reflux disease)    Hematuria    HLD (hyperlipidemia)    Hypertension 2016   IBS (irritable bowel syndrome)    Pre-diabetes     Vitamin D  deficiency    Past Surgical History:  Procedure Laterality Date   CARPAL TUNNEL RELEASE Right    CATARACT EXTRACTION, BILATERAL Bilateral    Dr. Charmayne   CERVICAL FUSION     HAND SURGERY Left    LAPAROSCOPIC APPENDECTOMY N/A 11/24/2016   Procedure: APPENDECTOMY LAPAROSCOPIC;  Surgeon: Signe Mitzie LABOR, MD;  Location: [facility] OR;  Service: General;  Laterality: N/A;   TRIGGER FINGER RELEASE Right    thumb   Family History  Problem Relation Age of Onset   Irritable bowel syndrome Mother    Diverticulosis Mother    Lupus Mother    COPD Mother    Hypertension Mother    Lung cancer Father    Other Father        bladder polyps   Stroke Father    Kidney disease Father    Cancer Father    Colon cancer Neg Hx    Esophageal cancer Neg Hx    Rectal cancer Neg Hx    Stomach cancer Neg Hx    Outpatient Medications Prior to Visit  Medication Sig Dispense Refill   albuterol  (VENTOLIN  HFA) 108 (90 Base) MCG/ACT inhaler Inhale 2 puffs into the lungs every 6 (six) hours as needed for wheezing or shortness of breath. 6.7 g 3   aspirin  EC 81 MG tablet Take 81 mg by mouth 2 (two) times a week. Swallow whole.     atorvastatin  (LIPITOR) 40 MG tablet TAKE 1 TABLET BY MOUTH DAILY FOR CHOLESTEROL 100 tablet 2   buPROPion  (WELLBUTRIN ) 75 MG tablet Take  1 tablet (75 mg total) by mouth daily. 45 tablet 0   Cholecalciferol  (VITAMIN D -3) 1000 UNITS CAPS Take 5,000 Units by mouth daily.      Fluticasone -Umeclidin-Vilant (TRELEGY ELLIPTA ) 100-62.5-25 MCG/ACT AEPB Inhale 1 puff into the lungs daily. 90 each 3   furosemide  (LASIX ) 20 MG tablet TAKE 1 TABLET BY MOUTH EVERY  OTHER DAY 45 tablet 3   losartan  (COZAAR ) 50 MG tablet Take 1 tablet (50 mg total) by mouth daily. 90 tablet 3   oxybutynin  (DITROPAN  XL) 15 MG 24 hr tablet Take 15 mg by mouth daily.     tamsulosin  (FLOMAX ) 0.4 MG CAPS capsule Take 2 capsules (0.8 mg total) by mouth daily after supper. 60 capsule 3   buPROPion  ER (WELLBUTRIN  SR)  100 MG 12 hr tablet Take 1 tablet (100 mg total) by mouth daily. 60 tablet 0   No facility-administered medications prior to visit.   No Known Allergies   ROS: A complete ROS was performed with pertinent positives/negatives noted in the HPI. The remainder of the ROS are negative.    Objective:   Today's Vitals   04/12/24 1124  BP: (!) 124/59  Pulse: 82  SpO2: 98%  Weight: 183 lb 12.8 oz (83.4 kg)  Height: 5' 9 (1.753 m)    Physical Exam   GENERAL: Well-appearing, in NAD. Well nourished.  SKIN: Pink, warm and dry.  Well-healed area to right shoulder from site of shave biopsy.  No redness, drainage, or edema.  Well-healing area to left mid trunk without redness, drainage or edema. Head: Normocephalic. NECK: Trachea midline. Full ROM w/o pain or tenderness.  RESPIRATORY: Chest wall symmetrical. Respirations even and non-labored.  MSK: Muscle tone and strength appropriate for age.  NEUROLOGIC: No motor or sensory deficits. Steady, even gait. C2-C12 intact.  PSYCH/MENTAL STATUS: Alert, oriented x 3. Cooperative, appropriate mood and affect.   Health Maintenance Due  Topic Date Due   COVID-19 Vaccine (7 - 2025-26 season) 02/22/2024    No results found for any visits on 04/12/24.  The 10-year ASCVD risk score (Arnett DK, et al., 2019) is: 22.8%     Assessment & Plan:  1. Visit for wound check (Primary) Sites are both healing well.  Pathology was reviewed with patient and no further follow-up is needed at this time.  No orders of the defined types were placed in this encounter.  Lab Orders  No laboratory test(s) ordered today   No images are attached to the encounter or orders placed in the encounter.  Return if symptoms worsen or fail to improve.    Patient to reach out to office if new, worrisome, or unresolved symptoms arise or if no improvement in patient's condition. Patient verbalized understanding and is agreeable to treatment plan. All questions answered to  patient's satisfaction.    Thersia Schuyler Stark, [place]

## 2024-04-18 ENCOUNTER — Other Ambulatory Visit (HOSPITAL_BASED_OUTPATIENT_CLINIC_OR_DEPARTMENT_OTHER): Payer: Self-pay | Admitting: *Deleted

## 2024-04-18 DIAGNOSIS — I10 Essential (primary) hypertension: Secondary | ICD-10-CM

## 2024-04-18 MED ORDER — LOSARTAN POTASSIUM 50 MG PO TABS: 50.0000 mg | ORAL_TABLET | Freq: Every day | ORAL | 3 refills | Status: AC

## 2024-04-25 ENCOUNTER — Encounter: Payer: Self-pay | Admitting: Radiology

## 2024-05-05 ENCOUNTER — Other Ambulatory Visit (HOSPITAL_BASED_OUTPATIENT_CLINIC_OR_DEPARTMENT_OTHER): Payer: Self-pay

## 2024-05-16 ENCOUNTER — Other Ambulatory Visit (HOSPITAL_BASED_OUTPATIENT_CLINIC_OR_DEPARTMENT_OTHER): Payer: Self-pay

## 2024-05-16 ENCOUNTER — Ambulatory Visit (INDEPENDENT_AMBULATORY_CARE_PROVIDER_SITE_OTHER): Admitting: Family Medicine

## 2024-05-16 ENCOUNTER — Encounter (HOSPITAL_BASED_OUTPATIENT_CLINIC_OR_DEPARTMENT_OTHER): Payer: Self-pay | Admitting: Family Medicine

## 2024-05-16 VITALS — BP 139/70 | HR 78 | Ht 69.0 in | Wt 185.0 lb

## 2024-05-16 DIAGNOSIS — Z0000 Encounter for general adult medical examination without abnormal findings: Secondary | ICD-10-CM

## 2024-05-16 DIAGNOSIS — N1831 Chronic kidney disease, stage 3a: Secondary | ICD-10-CM

## 2024-05-16 DIAGNOSIS — K219 Gastro-esophageal reflux disease without esophagitis: Secondary | ICD-10-CM

## 2024-05-16 DIAGNOSIS — R7303 Prediabetes: Secondary | ICD-10-CM | POA: Diagnosis not present

## 2024-05-16 DIAGNOSIS — I10 Essential (primary) hypertension: Secondary | ICD-10-CM | POA: Diagnosis not present

## 2024-05-16 DIAGNOSIS — L819 Disorder of pigmentation, unspecified: Secondary | ICD-10-CM

## 2024-05-16 DIAGNOSIS — Z122 Encounter for screening for malignant neoplasm of respiratory organs: Secondary | ICD-10-CM

## 2024-05-16 MED ORDER — OMEPRAZOLE 20 MG PO CPDR
20.0000 mg | DELAYED_RELEASE_CAPSULE | Freq: Every day | ORAL | 3 refills | Status: AC
Start: 2024-05-16 — End: ?
  Filled 2024-05-16: qty 30, 30d supply, fill #0

## 2024-05-16 NOTE — Patient Instructions (Signed)

## 2024-05-16 NOTE — Progress Notes (Signed)
 Subjective:   [name] Park Jun 30, 1950 05/16/2024  CC: Chief Complaint  Patient presents with   Annual Exam    Patient is here today for his physical. Denies any main concerns for today's visit.    HPI: [name] Park is a 73 y.o. male who presents for a routine health maintenance exam.  Labs collected at time of visit.   GERD: [name] Park presents for the medical management of GERD.  Current medication: None Well controlled: Not currently. Patient reports frequent coughing, sneezing, and exacerbation of acid reflux symptoms.    Abdominal pain: No Dysphagia: No Nausea/Vomiting: No Hematemesis: No  Blood in stool: no Alcohol Use: no  Recent EGD: no   HEALTH SCREENINGS: - Vision Screening: up to date - Dental Visits: up to date - Testicular Exam: Declined - STD Screening: Up to date - PSA (50+): Declined   Lab Results  Component Value Date   PSA1 3.6 02/04/2024   PSA 1.00 05/15/2023   PSA 0.78 05/14/2022   PSA 0.84 05/14/2021     - Colonoscopy (45+): Up to date  Discussed with patient purpose of the colonoscopy is to detect colon cancer at curable precancerous or early stages  - AAA Screening: Not applicable  Men age 10-75 who have ever smoked - Lung Cancer screening with low-dose CT: Discussed previous CT Lung screening in 2020 with 1 year follow up  -  Adults age 75-80 who are current cigarette smokers or quit within the last 15 years. Must have 20 pack year history.   Depression and Anxiety Screen done today and results listed below:     05/16/2024   10:06 AM 04/05/2024   11:00 AM 03/02/2024    1:56 PM 02/04/2024   10:55 AM 12/23/2023    9:31 AM  Depression screen PHQ 2/9  Decreased Interest 0 0 0 0 0  Down, Depressed, Hopeless 0 0 0 0 0  PHQ - 2 Score 0 0 0 0 0  Altered sleeping 0 0 0 0 0  Tired, decreased energy 0 0 0 0 0  Change in appetite 0 0 0 0 0  Feeling bad or failure about yourself  0 0 0 0 0  Trouble concentrating 0 0 0 0 0   Moving slowly or fidgety/restless 0 0 0 0 0  Suicidal thoughts 0 0 0 0 0  PHQ-9 Score 0 0  0  0  0   Difficult doing work/chores Not difficult at all Not difficult at all Not difficult at all Not difficult at all Not difficult at all     Data saved with a previous flowsheet row definition      05/16/2024   10:06 AM 04/05/2024   11:00 AM 03/02/2024    1:56 PM 02/04/2024   10:55 AM  GAD 7 : Generalized Anxiety Score  Nervous, Anxious, on Edge 0 0 0 0  Control/stop worrying 0 0 0 0  Worry too much - different things 0 0 0 0  Trouble relaxing 0 0 0 0  Restless 0 0 0 0  Easily annoyed or irritable 0 0 0 0  Afraid - awful might happen 0 0 0 0  Total GAD 7 Score 0 0 0 0  Anxiety Difficulty Not difficult at all Not difficult at all Not difficult at all Not difficult at all    IMMUNIZATIONS:  - Tdap: Tetanus vaccination status reviewed: last tetanus booster within 10 years. - Influenza: Up to date - Pneumovax: Up to  date - Prevnar: Up to date - Shingrix  vaccine (50+): Up to date   Past medical history, surgical history, medications, allergies, family history and social history reviewed with patient today and changes made to appropriate areas of the chart.   Past Medical History:  Diagnosis Date   Acute appendicitis 11/24/2016   Arthritis    Chronic kidney disease 2017   COPD (chronic obstructive pulmonary disease) (HCC)    GERD (gastroesophageal reflux disease)    Hematuria    HLD (hyperlipidemia)    Hypertension 2016   IBS (irritable bowel syndrome)    Pre-diabetes    Vitamin D  deficiency     Past Surgical History:  Procedure Laterality Date   APPENDECTOMY     CARPAL TUNNEL RELEASE Right    CATARACT EXTRACTION, BILATERAL Bilateral    Dr. Charmayne   CERVICAL FUSION     HAND SURGERY Left    LAPAROSCOPIC APPENDECTOMY N/A 11/24/2016   Procedure: APPENDECTOMY LAPAROSCOPIC;  Surgeon: Signe Mitzie LABOR, MD;  Location: [facility] OR;  Service: General;  Laterality: N/A;   TRIGGER  FINGER RELEASE Right    thumb    Current Outpatient Medications on File Prior to Visit  Medication Sig   albuterol  (VENTOLIN  HFA) 108 (90 Base) MCG/ACT inhaler Inhale 2 puffs into the lungs every 6 (six) hours as needed for wheezing or shortness of breath.   aspirin  EC 81 MG tablet Take 81 mg by mouth 2 (two) times a week. Swallow whole.   atorvastatin  (LIPITOR) 40 MG tablet TAKE 1 TABLET BY MOUTH DAILY FOR CHOLESTEROL   Cholecalciferol  (VITAMIN D -3) 1000 UNITS CAPS Take 5,000 Units by mouth daily.    Fluticasone -Umeclidin-Vilant (TRELEGY ELLIPTA ) 100-62.5-25 MCG/ACT AEPB Inhale 1 puff into the lungs daily.   furosemide  (LASIX ) 20 MG tablet TAKE 1 TABLET BY MOUTH EVERY  OTHER DAY   losartan  (COZAAR ) 50 MG tablet Take 1 tablet (50 mg total) by mouth daily.   oxybutynin  (DITROPAN  XL) 15 MG 24 hr tablet Take 15 mg by mouth daily.   tamsulosin  (FLOMAX ) 0.4 MG CAPS capsule Take 2 capsules (0.8 mg total) by mouth daily after supper.   No current facility-administered medications on file prior to visit.    No Known Allergies   Social History   Socioeconomic History   Marital status: Married    Spouse name: [name] Park   Number of children: 1   Years of education: 12   Highest education level: 12th grade  Occupational History   Occupation: retired  Tobacco Use   Smoking status: Former    Current packs/day: 0.00    Average packs/day: 1.5 packs/day for 35.0 years (52.5 ttl pk-yrs)    Types: Cigarettes    Start date: 02/04/1969    Quit date: 02/05/2004    Years since quitting: 20.2    Passive exposure: Past   Smokeless tobacco: Never  Vaping Use   Vaping status: Never Used  Substance and Sexual Activity   Alcohol use: Not Currently    Comment: seldom   Drug use: Never   Sexual activity: Not Currently  Other Topics Concern   Not on file  Social History Narrative   Not on file   Social Drivers of Health   Financial Resource Strain: Patient Declined (04/01/2024)   Overall  Financial Resource Strain (CARDIA)    Difficulty of Paying Living Expenses: Patient declined  Food Insecurity: Patient Declined (04/01/2024)   Hunger Vital Sign    Worried About Running Out of Food in the Last Year:  Patient declined    Barista in the Last Year: Patient declined  Transportation Needs: No Transportation Needs (04/01/2024)   PRAPARE - Administrator, Civil Service (Medical): No    Lack of Transportation (Non-Medical): No  Physical Activity: Insufficiently Active (04/01/2024)   Exercise Vital Sign    Days of Exercise per Week: 2 days    Minutes of Exercise per Session: 30 min  Stress: No Stress Concern Present (04/01/2024)   [employer] of Occupational Health - Occupational Stress Questionnaire    Feeling of Stress: Only a little  Social Connections: Unknown (04/01/2024)   Social Connection and Isolation Panel    Frequency of Communication with Friends and Family: Once a week    Frequency of Social Gatherings with Friends and Family: Patient declined    Attends Religious Services: Patient declined    Database Administrator or Organizations: No    Attends Engineer, Structural: Not on file    Marital Status: Married  Catering Manager Violence: Not At Risk (11/30/2023)   Humiliation, Afraid, Rape, and Kick questionnaire    Fear of Current or Ex-Partner: No    Emotionally Abused: No    Physically Abused: No    Sexually Abused: No   Social History   Tobacco Use  Smoking Status Former   Current packs/day: 0.00   Average packs/day: 1.5 packs/day for 35.0 years (52.5 ttl pk-yrs)   Types: Cigarettes   Start date: 02/04/1969   Quit date: 02/05/2004   Years since quitting: 20.2   Passive exposure: Past  Smokeless Tobacco Never   Social History   Substance and Sexual Activity  Alcohol Use Not Currently   Comment: seldom     Family History  Problem Relation Age of Onset   Irritable bowel syndrome Mother    Diverticulosis Mother     Lupus Mother    COPD Mother    Hypertension Mother    Lung cancer Father    Other Father        bladder polyps   Stroke Father    Kidney disease Father    Cancer Father    Colon cancer Neg Hx    Esophageal cancer Neg Hx    Rectal cancer Neg Hx    Stomach cancer Neg Hx      ROS: Denies fever, fatigue, unexplained weight loss/gain, CP, SHOB, and palpatitations. Denies neurological deficits, gastrointestinal and/or genitourinary complaints, and skin changes.   Objective:   Today's Vitals   05/16/24 1001  BP: 139/70  Pulse: 78  SpO2: 96%  Weight: 185 lb (83.9 kg)  Height: 5' 9 (1.753 m)    GENERAL APPEARANCE: Well-appearing, in NAD. Well nourished.  SKIN: Pink, warm and dry. Turgor normal. No rash, , ulceration, or ecchymoses. Multiple pigmented lesions scattered on face, scalp and extremities Hair evenly distributed.  HEENT: HEAD: Normocephalic.  EYES: PERRLA. EOMI. Lids intact w/o defect. Sclera white, Conjunctiva pink w/o exudate.  EARS: External ear w/o redness, swelling, masses or lesions. EAC clear. TM's intact, translucent w/o bulging, appropriate landmarks visualized. Appropriate acuity to conversational tones.  NOSE: Septum midline w/o deformity. Nares patent, mucosa pink and non-inflamed w/o drainage. No sinus tenderness.  THROAT: Uvula midline. Oropharynx clear. Tonsils non-inflamed w/o exudate. Oral mucosa pink and moist.  NECK: Supple, Trachea midline. Full ROM w/o pain or tenderness. No lymphadenopathy. Thyroid  non-tender w/o enlargement or palpable masses.  RESPIRATORY: Chest wall symmetrical w/o masses. Respirations even and non-labored. Breath sounds clear to  auscultation bilaterally. No wheezes, rales, rhonchi, or crackles. CARDIAC: S1, S2 present, regular rate and rhythm. No gallops, murmurs, rubs, or clicks. PMI w/o lifts, heaves, or thrills. No carotid bruits. Capillary refill <2 seconds. Peripheral pulses 2+ bilaterally. GI: Abdomen soft w/o distention.  Normoactive bowel sounds. No palpable masses or tenderness. No guarding or rebound tenderness. Liver and spleen w/o tenderness or enlargement. No CVA tenderness.  GU: Pt deferred exam. MSK: Muscle tone and strength appropriate for age, w/o atrophy or abnormal movement. EXTREMITIES: Active ROM intact, w/o tenderness, crepitus, or contracture. No obvious joint deformities or effusions. No clubbing, edema, or cyanosis.  NEUROLOGIC: CN's II-XII intact. Motor strength symmetrical with no obvious weakness. No sensory deficits. DTR 2+ symmetric bilaterally. Steady, even gait.  PSYCH/MENTAL STATUS: Alert, oriented x 3. Cooperative, appropriate mood and affect.    Assessment & Plan:   1. Annual physical exam (Primary) Discussed preventative screenings, vaccines, and healthy lifestyle with patient. Fasting labs completed.  - CBC with Differential/Platelet  2. Essential hypertension Stable. Continue current regimen.   3. Gastroesophageal reflux disease without esophagitis Start Omeprazole  20mg  and use Pepcid  20mg  PRN for active symptoms. Recommend monitoring dietary factors.   4. Stage 3a chronic kidney disease (HCC) Stable. Will check Cmp with labs today. Continue renal diet and avoidance of NSAIDs.  - Comprehensive metabolic panel with GFR  5. Prediabetes Will check A1C with labs today. Continue to control diet and exercise.  - Hemoglobin A1c  6. Pigmented skin lesion suspicious for malignant neoplasm Patient needing full body skin check with Derm.  - Ambulatory referral to Dermatology  7. Encounter for screening for lung cancer - CT CHEST LUNG CA SCREEN LOW DOSE W/O CM; Future   Orders Placed This Encounter  Procedures   CT CHEST LUNG CA SCREEN LOW DOSE W/O CM    Standing Status:   Future    Expiration Date:   05/16/2025    Preferred Imaging Location?:   [facility]   Comprehensive metabolic panel with GFR   CBC with Differential/Platelet   Hemoglobin A1c   Ambulatory  referral to Dermatology    Referral Priority:   Routine    Referral Type:   Consultation    Referral Reason:   Specialty Services Required    Requested Specialty:   Dermatology    Number of Visits Requested:   1    PATIENT COUNSELING: - Encouraged to adjust caloric intake to maintain or achieve ideal body weight, to reduce intake of dietary saturated fat and total fat, to limit sodium intake by avoiding high sodium foods and not adding table salt, and to maintain adequate dietary potassium and calcium  preferably from fresh fruits, vegetables, and low-fat dairy products.   - Advised to avoid cigarette smoking. - Discussed with the patient that most people either abstain from alcohol or drink within safe limits (<=14/week and <=4 drinks/occasion for males, <=7/weeks and <= 3 drinks/occasion for females) and that the risk for alcohol disorders and other health effects rises proportionally with the number of drinks per week and how often a drinker exceeds daily limits. - Discussed cessation/primary prevention of drug use and availability of treatment for abuse.   - Stressed the importance of regular exercise - Injury prevention: Discussed safety belts, safety helmets, smoke detector, smoking near bedding or upholstery.  - Dental health: Discussed importance of regular tooth brushing, flossing, and dental visits.  - Sexuality: Discussed sexually transmitted diseases, partner selection, use of condoms, avoidance of unintended pregnancy  and contraceptive alternatives.  NEXT PREVENTATIVE PHYSICAL DUE IN 1 YEAR.  Return in about 3 months (around 08/16/2024) for Follow up HTN,HLD, CKD.  Patient to reach out to office if new, worrisome, or unresolved symptoms arise or if no improvement in patient's condition. Patient verbalized understanding and is agreeable to treatment plan. All questions answered to patient's satisfaction.    Thersia Schuyler Stark, [place]

## 2024-05-17 LAB — CBC WITH DIFFERENTIAL/PLATELET
Basophils Absolute: 0 x10E3/uL (ref 0.0–0.2)
Basos: 1 %
EOS (ABSOLUTE): 0.4 x10E3/uL (ref 0.0–0.4)
Eos: 6 %
Hematocrit: 35.1 % — ABNORMAL LOW (ref 37.5–51.0)
Hemoglobin: 11.7 g/dL — ABNORMAL LOW (ref 13.0–17.7)
Immature Grans (Abs): 0 x10E3/uL (ref 0.0–0.1)
Immature Granulocytes: 0 %
Lymphocytes Absolute: 1.2 x10E3/uL (ref 0.7–3.1)
Lymphs: 19 %
MCH: 30.6 pg (ref 26.6–33.0)
MCHC: 33.3 g/dL (ref 31.5–35.7)
MCV: 92 fL (ref 79–97)
Monocytes Absolute: 0.5 x10E3/uL (ref 0.1–0.9)
Monocytes: 9 %
Neutrophils Absolute: 4.1 x10E3/uL (ref 1.4–7.0)
Neutrophils: 65 %
Platelets: 262 x10E3/uL (ref 150–450)
RBC: 3.82 x10E6/uL — ABNORMAL LOW (ref 4.14–5.80)
RDW: 12.6 % (ref 11.6–15.4)
WBC: 6.3 x10E3/uL (ref 3.4–10.8)

## 2024-05-17 LAB — COMPREHENSIVE METABOLIC PANEL WITH GFR
ALT: 26 IU/L (ref 0–44)
AST: 28 IU/L (ref 0–40)
Albumin: 4.1 g/dL (ref 3.8–4.8)
Alkaline Phosphatase: 99 IU/L (ref 47–123)
BUN/Creatinine Ratio: 14 (ref 10–24)
BUN: 23 mg/dL (ref 8–27)
Bilirubin Total: 0.4 mg/dL (ref 0.0–1.2)
CO2: 22 mmol/L (ref 20–29)
Calcium: 9.6 mg/dL (ref 8.6–10.2)
Chloride: 103 mmol/L (ref 96–106)
Creatinine, Ser: 1.62 mg/dL — ABNORMAL HIGH (ref 0.76–1.27)
Globulin, Total: 2.2 g/dL (ref 1.5–4.5)
Glucose: 92 mg/dL (ref 70–99)
Potassium: 4.3 mmol/L (ref 3.5–5.2)
Sodium: 139 mmol/L (ref 134–144)
Total Protein: 6.3 g/dL (ref 6.0–8.5)
eGFR: 44 mL/min/1.73 — ABNORMAL LOW (ref >59)

## 2024-05-17 LAB — HEMOGLOBIN A1C
Est. average glucose Bld gHb Est-mCnc: 105 mg/dL
Hgb A1c MFr Bld: 5.3 % (ref 4.8–5.6)

## 2024-05-18 ENCOUNTER — Encounter: Payer: Medicare Other | Admitting: Nurse Practitioner

## 2024-05-22 ENCOUNTER — Other Ambulatory Visit: Payer: Self-pay | Admitting: Nurse Practitioner

## 2024-05-22 ENCOUNTER — Ambulatory Visit (HOSPITAL_BASED_OUTPATIENT_CLINIC_OR_DEPARTMENT_OTHER): Payer: Self-pay | Admitting: Family Medicine

## 2024-05-22 NOTE — Progress Notes (Signed)
 Hi Wasif,  Your kidney function is stable. Blood counts are stable. Continue good dietary sources of iron to support anemia due to CKD. A1C is well controlled at 5.3.

## 2024-05-24 ENCOUNTER — Ambulatory Visit (INDEPENDENT_AMBULATORY_CARE_PROVIDER_SITE_OTHER)

## 2024-05-24 ENCOUNTER — Encounter (HOSPITAL_BASED_OUTPATIENT_CLINIC_OR_DEPARTMENT_OTHER): Payer: Self-pay

## 2024-05-24 VITALS — BP 142/67 | HR 100 | Temp 99.0°F | Ht 69.0 in | Wt 182.6 lb

## 2024-05-24 DIAGNOSIS — J101 Influenza due to other identified influenza virus with other respiratory manifestations: Secondary | ICD-10-CM | POA: Diagnosis not present

## 2024-05-24 LAB — POC COVID19 BINAXNOW: SARS Coronavirus 2 Ag: NEGATIVE

## 2024-05-24 LAB — POCT INFLUENZA A/B
Influenza A, POC: POSITIVE — AB
Influenza B, POC: NEGATIVE

## 2024-05-24 NOTE — Progress Notes (Signed)
   Acute Office Visit  Subjective:     Patient ID: [name] Park, male    DOB: 07-17-1950, 73 y.o.   MRN: [id number]  Chief Complaint  Patient presents with   Fever    Pt has had complaints of cough, fever, runny nose, and congestion for 3 days. States temp has been as high as 102.    Fever  Associated symptoms include coughing. Pertinent negatives include no nausea or vomiting.   Patient is in today for cold, cough, and fever that began 3 days ago. Pt states grandson had similar symptoms over Thanksgiving and was diagnosed with the flu. Pt currently taking delsym for cough and acetaminophen  for his fever. Tmax 102 at home.   Review of Systems  Constitutional:  Positive for chills, fever and malaise/fatigue.  Respiratory:  Positive for cough.   Gastrointestinal:  Negative for nausea and vomiting.  Musculoskeletal:  Positive for myalgias.       Objective:    BP (!) 142/67 (BP Location: Left Arm, Patient Position: Sitting, Cuff Size: Large)   Pulse 100   Temp 99 F (37.2 C) (Oral)   Ht 5' 9 (1.753 m)   Wt 182 lb 9.6 oz (82.8 kg)   SpO2 100%   BMI 26.97 kg/m    Physical Exam Constitutional:      Appearance: He is ill-appearing.  HENT:     Nose: Congestion present.  Eyes:     Pupils: Pupils are equal, round, and reactive to light.  Pulmonary:     Breath sounds: Normal breath sounds.     Comments: Positive for Cough Skin:    General: Skin is warm and dry.  Neurological:     Mental Status: He is alert.    Results for orders placed or performed in visit on 05/24/24  POCT Influenza A/B  Result Value Ref Range   Influenza A, POC Positive (A) Negative   Influenza B, POC Negative Negative  POC COVID-19 BinaxNow  Result Value Ref Range   SARS Coronavirus 2 Ag Negative Negative       Assessment & Plan:  1. Influenza A (Primary) - discussed symptom management as he is outside of the window for Tamiflu - discussed red flag signs/symptoms to seek ER or PCP  -  POCT Influenza A/B - POC COVID-19 BinaxNow    Return if symptoms worsen or fail to improve.  Lauraine FORBES Angus, FNP

## 2024-05-24 NOTE — Patient Instructions (Addendum)
 Influenza positive:  Stay home and away from others until symptoms are improving and fever free for 24 hours without the use of tylenol  or ibuprofen. If symptoms have improved and fever resolved, then can be in public setting, but should wear a mask around others, practice safe distancing, and perform hand hygiene for an additional 5 days. If symptoms worsen, or the patient develop shortness of breath, pulse oximeter reading of < 90%, or chest pain, seek immediate care at nearest emergency department or call 911.   Vitamin Regimen:  Vitamin C 500mg  twice daily  Vitamin D  5000 units once daily  Zinc 50-75mg  once daily   Over the counter Medications:  Aspirin  81mg  per day * unless allergic or contraindicated*  Use Tylenol  (acetaminophen ) for fever *unless allergic or contraindicated*    Non-Medication Therapy:  Drink plenty of fluids, warm if possible.   A teaspoon of honey may help ease coughing symptoms.   Cough drops or hard candy for coughing.   Over the Counter Medication Therapy:  Use a cough expectorant such as guaifenesin (Mucinex) if recommended by your doctor for a wet, congested cough. If you have high blood pressure, please ask your doctor first before using this. Please only take plain mucinex not DM as this can raise your blood pressure.  Use a cough suppressant such as dextromethorphan (Robitussin/Delsym) for a dry cough. If you have high blood pressure, please ask your doctor first before using this.   If you have high blood pressure, medication such as Coricidin HBP is safe to take for your cough and will not increase your blood pressure.

## 2024-05-31 ENCOUNTER — Encounter (HOSPITAL_BASED_OUTPATIENT_CLINIC_OR_DEPARTMENT_OTHER): Payer: Self-pay | Admitting: Family Medicine

## 2024-05-31 MED ORDER — ATORVASTATIN CALCIUM 40 MG PO TABS: ORAL_TABLET | ORAL | 2 refills | Status: AC

## 2024-06-22 ENCOUNTER — Other Ambulatory Visit: Payer: Self-pay | Admitting: Cardiovascular Disease

## 2024-07-12 ENCOUNTER — Other Ambulatory Visit (HOSPITAL_BASED_OUTPATIENT_CLINIC_OR_DEPARTMENT_OTHER): Payer: Self-pay

## 2024-07-20 ENCOUNTER — Ambulatory Visit: Payer: Self-pay

## 2024-07-20 NOTE — Telephone Encounter (Signed)
 FYI Only or Action Required?: FYI only for provider: Home care advised.  Patient was last seen in primary care on 05/24/2024 by Gari Lauraine BRAVO, FNP.  Called Nurse Triage reporting Fall.  Symptoms began today.  Interventions attempted: Nothing.  Symptoms are: unchanged.  Triage Disposition: Home Care  Patient/caregiver understands and will follow disposition?:   Reason for Disposition  Small cut (scratch) or abrasion (scrape) is also present  Answer Assessment - Initial Assessment Questions Pt calling to report a fall that happened an hour ago. Pt slipped and fell, hit head on concrete, hurt for a little while then pain stopped, scrape to back of head. Denies pain, dizziness, loss of consciousness, visual changes.   Patient taking baby aspirin . Per protocol pt advised home care and symptoms to watch for, and to call back with any changes.   1. MECHANISM: How did the injury happen? For falls, ask: What height did you fall from? and What surface did you fall against?      Pt fell outside  2. ONSET: When did the injury happen? (e.g., minutes, hours ago)      1 hour ago 3. NEUROLOGIC SYMPTOMS: Was there any loss of consciousness? Are there any other neurological symptoms?      Denies  4. MENTAL STATUS: Does the person know who they are, who you are, and where they are?      Yes  5. LOCATION: What part of the head was hit?      Back of head 6. SCALP APPEARANCE: What does the scalp look like? Is it bleeding now? If Yes, ask: Is it difficult to stop?      Stopped bleeding 7. SIZE: For cuts, bruises, or swelling, ask: How large is it? (e.g., inches or centimeters)      Scrape  8. PAIN: Is there any pain? If Yes, ask: How bad is it? (Scale 0-10; or none, mild, moderate, severe)     0/10 9. TETANUS: For any breaks in the skin, ask: When was your last tetanus booster?     N/a  10. BLOOD THINNERS: Do you take any blood thinners? (e.g., aspirin , clopidogrel /  Plavix, coumadin, heparin ). Notes: Other strong blood thinners include: Arixtra (fondaparinux), Eliquis (apixaban), Pradaxa (dabigatran), and Xarelto (rivaroxaban).       Baby aspirin   11. OTHER SYMPTOMS: Do you have any other symptoms? (e.g., neck pain, vomiting)       Denies  Protocols used: Head Injury-A-AH  Reason for Triage: fell and hit back of the head on the driveway, bottom hit first and then head, have a skinned up spot on back of head, little bleeding

## 2024-07-21 NOTE — Telephone Encounter (Signed)
 Called pt to check to see how he was doing after falling and he said he was doing okay. Did state to pt if symptoms worsened to seek emergent care and pt verbalized understanding. Nothing further needed.

## 2024-08-16 ENCOUNTER — Ambulatory Visit (HOSPITAL_BASED_OUTPATIENT_CLINIC_OR_DEPARTMENT_OTHER): Admitting: Family Medicine

## 2024-12-26 ENCOUNTER — Ambulatory Visit: Admitting: Physician Assistant
